# Patient Record
Sex: Female | Born: 1950
Health system: Southern US, Community
[De-identification: ages and names within clinical notes are randomized; demographics above are authoritative.]

## PROBLEM LIST (undated history)

## (undated) DIAGNOSIS — E785 Hyperlipidemia, unspecified: Secondary | ICD-10-CM

## (undated) DIAGNOSIS — R634 Abnormal weight loss: Secondary | ICD-10-CM

## (undated) DIAGNOSIS — D696 Thrombocytopenia, unspecified: Secondary | ICD-10-CM

## (undated) DIAGNOSIS — H269 Unspecified cataract: Secondary | ICD-10-CM

## (undated) DIAGNOSIS — M79606 Pain in leg, unspecified: Secondary | ICD-10-CM

## (undated) DIAGNOSIS — T7840XA Allergy, unspecified, initial encounter: Secondary | ICD-10-CM

## (undated) DIAGNOSIS — I10 Essential (primary) hypertension: Secondary | ICD-10-CM

## (undated) DIAGNOSIS — N289 Disorder of kidney and ureter, unspecified: Secondary | ICD-10-CM

## (undated) DIAGNOSIS — F17200 Nicotine dependence, unspecified, uncomplicated: Secondary | ICD-10-CM

## (undated) DIAGNOSIS — F209 Schizophrenia, unspecified: Secondary | ICD-10-CM

## (undated) DIAGNOSIS — G894 Chronic pain syndrome: Secondary | ICD-10-CM

## (undated) DIAGNOSIS — N189 Chronic kidney disease, unspecified: Secondary | ICD-10-CM

## (undated) DIAGNOSIS — M810 Age-related osteoporosis without current pathological fracture: Secondary | ICD-10-CM

## (undated) HISTORY — DX: Thrombocytopenia, unspecified: D69.6

## (undated) HISTORY — DX: Allergy, unspecified, initial encounter: T78.40XA

## (undated) HISTORY — DX: Disorder of kidney and ureter, unspecified: N28.9

## (undated) HISTORY — DX: Chronic kidney disease, unspecified: N18.9

## (undated) HISTORY — DX: Hyperlipidemia, unspecified: E78.5

## (undated) HISTORY — DX: Unspecified cataract: H26.9

## (undated) HISTORY — DX: Pain in leg, unspecified: M79.606

## (undated) HISTORY — DX: Schizophrenia, unspecified: F20.9

## (undated) HISTORY — PX: CATARACT EXTRACTION: SUR2

## (undated) HISTORY — DX: Abnormal weight loss: R63.4

## (undated) HISTORY — DX: Essential (primary) hypertension: I10

## (undated) HISTORY — DX: Chronic pain syndrome: G89.4

## (undated) HISTORY — DX: Nicotine dependence, unspecified, uncomplicated: F17.200

## (undated) HISTORY — PX: OTHER SURGICAL HISTORY: SHX169

## (undated) HISTORY — DX: Age-related osteoporosis without current pathological fracture: M81.0

---

## 2011-04-09 ENCOUNTER — Ambulatory Visit: Payer: Self-pay | Admitting: Oncology

## 2011-04-11 ENCOUNTER — Ambulatory Visit: Payer: Self-pay | Admitting: Oncology

## 2011-05-11 ENCOUNTER — Ambulatory Visit: Payer: Self-pay | Admitting: Oncology

## 2011-07-17 ENCOUNTER — Ambulatory Visit: Payer: Self-pay | Admitting: Oncology

## 2011-08-11 ENCOUNTER — Ambulatory Visit: Payer: Self-pay | Admitting: Oncology

## 2011-09-11 ENCOUNTER — Ambulatory Visit: Payer: Self-pay | Admitting: Oncology

## 2011-10-11 ENCOUNTER — Ambulatory Visit: Payer: Self-pay | Admitting: Oncology

## 2011-12-31 DIAGNOSIS — E7800 Pure hypercholesterolemia, unspecified: Secondary | ICD-10-CM | POA: Diagnosis not present

## 2011-12-31 DIAGNOSIS — I10 Essential (primary) hypertension: Secondary | ICD-10-CM | POA: Diagnosis not present

## 2011-12-31 DIAGNOSIS — Z01419 Encounter for gynecological examination (general) (routine) without abnormal findings: Secondary | ICD-10-CM | POA: Diagnosis not present

## 2011-12-31 DIAGNOSIS — G8929 Other chronic pain: Secondary | ICD-10-CM | POA: Diagnosis not present

## 2012-01-15 DIAGNOSIS — I10 Essential (primary) hypertension: Secondary | ICD-10-CM | POA: Diagnosis not present

## 2012-01-15 DIAGNOSIS — M5430 Sciatica, unspecified side: Secondary | ICD-10-CM | POA: Diagnosis not present

## 2012-01-15 DIAGNOSIS — M549 Dorsalgia, unspecified: Secondary | ICD-10-CM | POA: Diagnosis not present

## 2012-01-15 DIAGNOSIS — I739 Peripheral vascular disease, unspecified: Secondary | ICD-10-CM | POA: Diagnosis not present

## 2012-01-23 DIAGNOSIS — Z1231 Encounter for screening mammogram for malignant neoplasm of breast: Secondary | ICD-10-CM | POA: Diagnosis not present

## 2012-02-10 DIAGNOSIS — I10 Essential (primary) hypertension: Secondary | ICD-10-CM | POA: Diagnosis not present

## 2012-02-10 DIAGNOSIS — E872 Acidosis: Secondary | ICD-10-CM | POA: Diagnosis not present

## 2012-02-10 DIAGNOSIS — D472 Monoclonal gammopathy: Secondary | ICD-10-CM | POA: Diagnosis not present

## 2012-03-01 ENCOUNTER — Ambulatory Visit: Payer: Self-pay | Admitting: Oncology

## 2012-03-01 DIAGNOSIS — F209 Schizophrenia, unspecified: Secondary | ICD-10-CM | POA: Diagnosis not present

## 2012-03-01 DIAGNOSIS — Z79899 Other long term (current) drug therapy: Secondary | ICD-10-CM | POA: Diagnosis not present

## 2012-03-01 DIAGNOSIS — D693 Immune thrombocytopenic purpura: Secondary | ICD-10-CM | POA: Diagnosis not present

## 2012-03-01 DIAGNOSIS — F17200 Nicotine dependence, unspecified, uncomplicated: Secondary | ICD-10-CM | POA: Diagnosis not present

## 2012-03-01 DIAGNOSIS — M810 Age-related osteoporosis without current pathological fracture: Secondary | ICD-10-CM | POA: Diagnosis not present

## 2012-03-01 DIAGNOSIS — I129 Hypertensive chronic kidney disease with stage 1 through stage 4 chronic kidney disease, or unspecified chronic kidney disease: Secondary | ICD-10-CM | POA: Diagnosis not present

## 2012-03-01 DIAGNOSIS — N189 Chronic kidney disease, unspecified: Secondary | ICD-10-CM | POA: Diagnosis not present

## 2012-03-01 DIAGNOSIS — E785 Hyperlipidemia, unspecified: Secondary | ICD-10-CM | POA: Diagnosis not present

## 2012-03-01 DIAGNOSIS — D472 Monoclonal gammopathy: Secondary | ICD-10-CM | POA: Diagnosis not present

## 2012-03-01 LAB — CBC CANCER CENTER
Basophil #: 0.1 x10 3/mm (ref 0.0–0.1)
Basophil %: 1.2 %
Eosinophil %: 2.2 %
Lymphocyte #: 2.2 x10 3/mm (ref 1.0–3.6)
Lymphocyte %: 34 %
MCV: 92 fL (ref 80–100)
Monocyte #: 0.6 x10 3/mm (ref 0.2–0.9)
Neutrophil %: 54 %
Platelet: 38 x10 3/mm — ABNORMAL LOW (ref 150–440)
RBC: 4.68 10*6/uL (ref 3.80–5.20)
RDW: 14.2 % (ref 11.5–14.5)

## 2012-03-08 DIAGNOSIS — I129 Hypertensive chronic kidney disease with stage 1 through stage 4 chronic kidney disease, or unspecified chronic kidney disease: Secondary | ICD-10-CM | POA: Diagnosis not present

## 2012-03-08 DIAGNOSIS — E785 Hyperlipidemia, unspecified: Secondary | ICD-10-CM | POA: Diagnosis not present

## 2012-03-08 DIAGNOSIS — D693 Immune thrombocytopenic purpura: Secondary | ICD-10-CM | POA: Diagnosis not present

## 2012-03-08 DIAGNOSIS — D472 Monoclonal gammopathy: Secondary | ICD-10-CM | POA: Diagnosis not present

## 2012-03-08 DIAGNOSIS — N189 Chronic kidney disease, unspecified: Secondary | ICD-10-CM | POA: Diagnosis not present

## 2012-03-08 DIAGNOSIS — M810 Age-related osteoporosis without current pathological fracture: Secondary | ICD-10-CM | POA: Diagnosis not present

## 2012-03-08 LAB — CBC CANCER CENTER
Eosinophil #: 0 x10 3/mm (ref 0.0–0.7)
Eosinophil %: 0.3 %
HCT: 43.6 % (ref 35.0–47.0)
HGB: 14.1 g/dL (ref 12.0–16.0)
Lymphocyte %: 22.6 %
MCH: 29.8 pg (ref 26.0–34.0)
MCHC: 32.4 g/dL (ref 32.0–36.0)
MCV: 92 fL (ref 80–100)
Monocyte #: 1.4 x10 3/mm — ABNORMAL HIGH (ref 0.2–0.9)

## 2012-03-10 ENCOUNTER — Ambulatory Visit: Payer: Self-pay | Admitting: Oncology

## 2012-04-02 DIAGNOSIS — J309 Allergic rhinitis, unspecified: Secondary | ICD-10-CM | POA: Diagnosis not present

## 2012-04-06 DIAGNOSIS — F209 Schizophrenia, unspecified: Secondary | ICD-10-CM | POA: Diagnosis not present

## 2012-06-07 ENCOUNTER — Ambulatory Visit: Payer: Self-pay | Admitting: Oncology

## 2012-06-07 DIAGNOSIS — I129 Hypertensive chronic kidney disease with stage 1 through stage 4 chronic kidney disease, or unspecified chronic kidney disease: Secondary | ICD-10-CM | POA: Diagnosis not present

## 2012-06-07 DIAGNOSIS — M810 Age-related osteoporosis without current pathological fracture: Secondary | ICD-10-CM | POA: Diagnosis not present

## 2012-06-07 DIAGNOSIS — F17200 Nicotine dependence, unspecified, uncomplicated: Secondary | ICD-10-CM | POA: Diagnosis not present

## 2012-06-07 DIAGNOSIS — N189 Chronic kidney disease, unspecified: Secondary | ICD-10-CM | POA: Diagnosis not present

## 2012-06-07 DIAGNOSIS — F209 Schizophrenia, unspecified: Secondary | ICD-10-CM | POA: Diagnosis not present

## 2012-06-07 DIAGNOSIS — D693 Immune thrombocytopenic purpura: Secondary | ICD-10-CM | POA: Diagnosis not present

## 2012-06-07 DIAGNOSIS — Z79899 Other long term (current) drug therapy: Secondary | ICD-10-CM | POA: Diagnosis not present

## 2012-06-07 DIAGNOSIS — E785 Hyperlipidemia, unspecified: Secondary | ICD-10-CM | POA: Diagnosis not present

## 2012-06-07 DIAGNOSIS — D472 Monoclonal gammopathy: Secondary | ICD-10-CM | POA: Diagnosis not present

## 2012-06-07 DIAGNOSIS — M1990 Unspecified osteoarthritis, unspecified site: Secondary | ICD-10-CM | POA: Diagnosis not present

## 2012-06-07 LAB — CBC CANCER CENTER
Eosinophil #: 0.1 x10 3/mm (ref 0.0–0.7)
Eosinophil %: 2.6 %
MCH: 31.3 pg (ref 26.0–34.0)
MCHC: 34.3 g/dL (ref 32.0–36.0)
MCV: 91 fL (ref 80–100)
Monocyte #: 0.4 x10 3/mm (ref 0.2–0.9)
Neutrophil %: 56.6 %
Platelet: 23 x10 3/mm — CL (ref 150–440)
RBC: 4.57 10*6/uL (ref 3.80–5.20)
RDW: 13.2 % (ref 11.5–14.5)

## 2012-06-08 DIAGNOSIS — I10 Essential (primary) hypertension: Secondary | ICD-10-CM | POA: Diagnosis not present

## 2012-06-08 DIAGNOSIS — D472 Monoclonal gammopathy: Secondary | ICD-10-CM | POA: Diagnosis not present

## 2012-06-08 DIAGNOSIS — E872 Acidosis: Secondary | ICD-10-CM | POA: Diagnosis not present

## 2012-06-10 ENCOUNTER — Ambulatory Visit: Payer: Self-pay | Admitting: Oncology

## 2012-06-10 DIAGNOSIS — D693 Immune thrombocytopenic purpura: Secondary | ICD-10-CM | POA: Diagnosis not present

## 2012-06-10 DIAGNOSIS — D472 Monoclonal gammopathy: Secondary | ICD-10-CM | POA: Diagnosis not present

## 2012-06-10 DIAGNOSIS — Z5111 Encounter for antineoplastic chemotherapy: Secondary | ICD-10-CM | POA: Diagnosis not present

## 2012-06-10 DIAGNOSIS — I129 Hypertensive chronic kidney disease with stage 1 through stage 4 chronic kidney disease, or unspecified chronic kidney disease: Secondary | ICD-10-CM | POA: Diagnosis not present

## 2012-06-10 DIAGNOSIS — F17200 Nicotine dependence, unspecified, uncomplicated: Secondary | ICD-10-CM | POA: Diagnosis not present

## 2012-06-10 DIAGNOSIS — M810 Age-related osteoporosis without current pathological fracture: Secondary | ICD-10-CM | POA: Diagnosis not present

## 2012-06-10 DIAGNOSIS — Z79899 Other long term (current) drug therapy: Secondary | ICD-10-CM | POA: Diagnosis not present

## 2012-06-10 DIAGNOSIS — E785 Hyperlipidemia, unspecified: Secondary | ICD-10-CM | POA: Diagnosis not present

## 2012-06-10 DIAGNOSIS — N189 Chronic kidney disease, unspecified: Secondary | ICD-10-CM | POA: Diagnosis not present

## 2012-06-10 DIAGNOSIS — F259 Schizoaffective disorder, unspecified: Secondary | ICD-10-CM | POA: Diagnosis not present

## 2012-06-10 DIAGNOSIS — M1990 Unspecified osteoarthritis, unspecified site: Secondary | ICD-10-CM | POA: Diagnosis not present

## 2012-07-06 DIAGNOSIS — D693 Immune thrombocytopenic purpura: Secondary | ICD-10-CM | POA: Diagnosis not present

## 2012-07-06 DIAGNOSIS — D472 Monoclonal gammopathy: Secondary | ICD-10-CM | POA: Diagnosis not present

## 2012-07-06 LAB — CBC CANCER CENTER
Basophil #: 0 x10 3/mm (ref 0.0–0.1)
Eosinophil #: 0.2 x10 3/mm (ref 0.0–0.7)
Eosinophil %: 3.8 %
HCT: 40.4 % (ref 35.0–47.0)
HGB: 13.3 g/dL (ref 12.0–16.0)
Lymphocyte #: 1.7 x10 3/mm (ref 1.0–3.6)
Lymphocyte %: 30.7 %
MCV: 92 fL (ref 80–100)
Monocyte %: 9.3 %
Neutrophil #: 3 x10 3/mm (ref 1.4–6.5)
Neutrophil %: 55.3 %
Platelet: 13 x10 3/mm — CL (ref 150–440)
RBC: 4.4 10*6/uL (ref 3.80–5.20)
RDW: 13.4 % (ref 11.5–14.5)
WBC: 5.4 x10 3/mm (ref 3.6–11.0)

## 2012-07-09 DIAGNOSIS — I10 Essential (primary) hypertension: Secondary | ICD-10-CM | POA: Diagnosis not present

## 2012-07-09 DIAGNOSIS — E785 Hyperlipidemia, unspecified: Secondary | ICD-10-CM | POA: Diagnosis not present

## 2012-07-11 ENCOUNTER — Ambulatory Visit: Payer: Self-pay | Admitting: Oncology

## 2012-07-11 DIAGNOSIS — D693 Immune thrombocytopenic purpura: Secondary | ICD-10-CM | POA: Diagnosis not present

## 2012-07-11 DIAGNOSIS — F209 Schizophrenia, unspecified: Secondary | ICD-10-CM | POA: Diagnosis not present

## 2012-07-11 DIAGNOSIS — M810 Age-related osteoporosis without current pathological fracture: Secondary | ICD-10-CM | POA: Diagnosis not present

## 2012-07-11 DIAGNOSIS — N189 Chronic kidney disease, unspecified: Secondary | ICD-10-CM | POA: Diagnosis not present

## 2012-07-11 DIAGNOSIS — Z79899 Other long term (current) drug therapy: Secondary | ICD-10-CM | POA: Diagnosis not present

## 2012-07-11 DIAGNOSIS — Z5111 Encounter for antineoplastic chemotherapy: Secondary | ICD-10-CM | POA: Diagnosis not present

## 2012-07-11 DIAGNOSIS — E785 Hyperlipidemia, unspecified: Secondary | ICD-10-CM | POA: Diagnosis not present

## 2012-07-11 DIAGNOSIS — I129 Hypertensive chronic kidney disease with stage 1 through stage 4 chronic kidney disease, or unspecified chronic kidney disease: Secondary | ICD-10-CM | POA: Diagnosis not present

## 2012-07-11 DIAGNOSIS — D472 Monoclonal gammopathy: Secondary | ICD-10-CM | POA: Diagnosis not present

## 2012-07-13 DIAGNOSIS — D472 Monoclonal gammopathy: Secondary | ICD-10-CM | POA: Diagnosis not present

## 2012-07-13 DIAGNOSIS — D693 Immune thrombocytopenic purpura: Secondary | ICD-10-CM | POA: Diagnosis not present

## 2012-07-13 LAB — CBC CANCER CENTER
Basophil #: 0 x10 3/mm (ref 0.0–0.1)
Basophil %: 0.6 %
Eosinophil #: 0.2 x10 3/mm (ref 0.0–0.7)
Eosinophil %: 3.8 %
HCT: 42.7 % (ref 35.0–47.0)
Lymphocyte #: 1.5 x10 3/mm (ref 1.0–3.6)
MCH: 30.4 pg (ref 26.0–34.0)
MCV: 92 fL (ref 80–100)
Neutrophil #: 3.1 x10 3/mm (ref 1.4–6.5)
Platelet: 65 x10 3/mm — ABNORMAL LOW (ref 150–440)
RBC: 4.64 10*6/uL (ref 3.80–5.20)
WBC: 5.3 x10 3/mm (ref 3.6–11.0)

## 2012-07-20 DIAGNOSIS — D472 Monoclonal gammopathy: Secondary | ICD-10-CM | POA: Diagnosis not present

## 2012-07-20 DIAGNOSIS — D693 Immune thrombocytopenic purpura: Secondary | ICD-10-CM | POA: Diagnosis not present

## 2012-07-20 LAB — CBC CANCER CENTER
Basophil #: 0 x10 3/mm (ref 0.0–0.1)
Basophil %: 0.7 %
Eosinophil #: 0.3 x10 3/mm (ref 0.0–0.7)
Eosinophil %: 4.3 %
HGB: 13.8 g/dL (ref 12.0–16.0)
Lymphocyte #: 1.6 x10 3/mm (ref 1.0–3.6)
Lymphocyte %: 24.7 %
MCHC: 33.1 g/dL (ref 32.0–36.0)
MCV: 91 fL (ref 80–100)
Monocyte #: 0.5 x10 3/mm (ref 0.2–0.9)
Monocyte %: 7.7 %
Neutrophil #: 4.1 x10 3/mm (ref 1.4–6.5)
Neutrophil %: 62.6 %
Platelet: 33 x10 3/mm — ABNORMAL LOW (ref 150–440)
RBC: 4.55 10*6/uL (ref 3.80–5.20)

## 2012-07-27 DIAGNOSIS — D693 Immune thrombocytopenic purpura: Secondary | ICD-10-CM | POA: Diagnosis not present

## 2012-07-27 DIAGNOSIS — D472 Monoclonal gammopathy: Secondary | ICD-10-CM | POA: Diagnosis not present

## 2012-07-27 LAB — CBC CANCER CENTER
Basophil %: 0.9 %
Eosinophil #: 0.2 x10 3/mm (ref 0.0–0.7)
HCT: 42.8 % (ref 35.0–47.0)
Lymphocyte %: 20.8 %
MCH: 29.9 pg (ref 26.0–34.0)
MCV: 91 fL (ref 80–100)
Monocyte #: 0.6 x10 3/mm (ref 0.2–0.9)
Monocyte %: 8.5 %
Neutrophil #: 4.8 x10 3/mm (ref 1.4–6.5)
Neutrophil %: 66.6 %
Platelet: 51 x10 3/mm — ABNORMAL LOW (ref 150–440)
RBC: 4.73 10*6/uL (ref 3.80–5.20)

## 2012-08-10 ENCOUNTER — Ambulatory Visit: Payer: Self-pay | Admitting: Oncology

## 2012-08-10 DIAGNOSIS — N189 Chronic kidney disease, unspecified: Secondary | ICD-10-CM | POA: Diagnosis not present

## 2012-08-10 DIAGNOSIS — F209 Schizophrenia, unspecified: Secondary | ICD-10-CM | POA: Diagnosis not present

## 2012-08-10 DIAGNOSIS — D693 Immune thrombocytopenic purpura: Secondary | ICD-10-CM | POA: Diagnosis not present

## 2012-08-10 DIAGNOSIS — I129 Hypertensive chronic kidney disease with stage 1 through stage 4 chronic kidney disease, or unspecified chronic kidney disease: Secondary | ICD-10-CM | POA: Diagnosis not present

## 2012-08-10 DIAGNOSIS — E785 Hyperlipidemia, unspecified: Secondary | ICD-10-CM | POA: Diagnosis not present

## 2012-08-10 DIAGNOSIS — M810 Age-related osteoporosis without current pathological fracture: Secondary | ICD-10-CM | POA: Diagnosis not present

## 2012-08-10 DIAGNOSIS — D472 Monoclonal gammopathy: Secondary | ICD-10-CM | POA: Diagnosis not present

## 2012-08-10 DIAGNOSIS — M1990 Unspecified osteoarthritis, unspecified site: Secondary | ICD-10-CM | POA: Diagnosis not present

## 2012-08-10 DIAGNOSIS — Z79899 Other long term (current) drug therapy: Secondary | ICD-10-CM | POA: Diagnosis not present

## 2012-08-24 DIAGNOSIS — D693 Immune thrombocytopenic purpura: Secondary | ICD-10-CM | POA: Diagnosis not present

## 2012-08-24 DIAGNOSIS — D472 Monoclonal gammopathy: Secondary | ICD-10-CM | POA: Diagnosis not present

## 2012-08-24 LAB — CBC CANCER CENTER
Basophil #: 0.1 x10 3/mm (ref 0.0–0.1)
Eosinophil #: 0.2 x10 3/mm (ref 0.0–0.7)
HCT: 42 % (ref 35.0–47.0)
HGB: 13.7 g/dL (ref 12.0–16.0)
Lymphocyte %: 25 %
MCHC: 32.6 g/dL (ref 32.0–36.0)
Monocyte #: 0.7 x10 3/mm (ref 0.2–0.9)
Monocyte %: 9.1 %
Neutrophil #: 4.7 x10 3/mm (ref 1.4–6.5)
Neutrophil %: 62.6 %
RDW: 14.2 % (ref 11.5–14.5)
WBC: 7.5 x10 3/mm (ref 3.6–11.0)

## 2012-09-10 ENCOUNTER — Ambulatory Visit: Payer: Self-pay | Admitting: Oncology

## 2012-09-10 DIAGNOSIS — D693 Immune thrombocytopenic purpura: Secondary | ICD-10-CM | POA: Diagnosis not present

## 2012-09-10 DIAGNOSIS — D472 Monoclonal gammopathy: Secondary | ICD-10-CM | POA: Diagnosis not present

## 2012-09-24 LAB — CBC CANCER CENTER
Basophil #: 0.1 x10 3/mm (ref 0.0–0.1)
HCT: 41 % (ref 35.0–47.0)
Lymphocyte #: 2 x10 3/mm (ref 1.0–3.6)
Lymphocyte %: 29.8 %
MCV: 93 fL (ref 80–100)
Monocyte #: 0.5 x10 3/mm (ref 0.2–0.9)
Monocyte %: 8 %
Neutrophil #: 3.8 x10 3/mm (ref 1.4–6.5)
Neutrophil %: 57.4 %
Platelet: 41 x10 3/mm — ABNORMAL LOW (ref 150–440)
RBC: 4.42 10*6/uL (ref 3.80–5.20)
RDW: 14.8 % — ABNORMAL HIGH (ref 11.5–14.5)
WBC: 6.7 x10 3/mm (ref 3.6–11.0)

## 2012-10-04 DIAGNOSIS — E785 Hyperlipidemia, unspecified: Secondary | ICD-10-CM | POA: Diagnosis not present

## 2012-10-04 DIAGNOSIS — I10 Essential (primary) hypertension: Secondary | ICD-10-CM | POA: Diagnosis not present

## 2012-10-04 DIAGNOSIS — Z23 Encounter for immunization: Secondary | ICD-10-CM | POA: Diagnosis not present

## 2012-10-10 ENCOUNTER — Ambulatory Visit: Payer: Self-pay | Admitting: Oncology

## 2012-10-10 DIAGNOSIS — E785 Hyperlipidemia, unspecified: Secondary | ICD-10-CM | POA: Diagnosis not present

## 2012-10-10 DIAGNOSIS — N189 Chronic kidney disease, unspecified: Secondary | ICD-10-CM | POA: Diagnosis not present

## 2012-10-10 DIAGNOSIS — M1990 Unspecified osteoarthritis, unspecified site: Secondary | ICD-10-CM | POA: Diagnosis not present

## 2012-10-10 DIAGNOSIS — Z79899 Other long term (current) drug therapy: Secondary | ICD-10-CM | POA: Diagnosis not present

## 2012-10-10 DIAGNOSIS — I129 Hypertensive chronic kidney disease with stage 1 through stage 4 chronic kidney disease, or unspecified chronic kidney disease: Secondary | ICD-10-CM | POA: Diagnosis not present

## 2012-10-10 DIAGNOSIS — D472 Monoclonal gammopathy: Secondary | ICD-10-CM | POA: Diagnosis not present

## 2012-10-10 DIAGNOSIS — M810 Age-related osteoporosis without current pathological fracture: Secondary | ICD-10-CM | POA: Diagnosis not present

## 2012-10-10 DIAGNOSIS — F209 Schizophrenia, unspecified: Secondary | ICD-10-CM | POA: Diagnosis not present

## 2012-10-10 DIAGNOSIS — D693 Immune thrombocytopenic purpura: Secondary | ICD-10-CM | POA: Diagnosis not present

## 2012-10-26 LAB — CBC CANCER CENTER
Basophil #: 0 x10 3/mm (ref 0.0–0.1)
Basophil %: 0.8 %
Eosinophil #: 0.1 x10 3/mm (ref 0.0–0.7)
HCT: 40.3 % (ref 35.0–47.0)
HGB: 14.1 g/dL (ref 12.0–16.0)
Lymphocyte #: 2.1 x10 3/mm (ref 1.0–3.6)
Lymphocyte %: 42.3 %
MCH: 31.1 pg (ref 26.0–34.0)
MCHC: 35 g/dL (ref 32.0–36.0)
Monocyte #: 0.7 x10 3/mm (ref 0.2–0.9)
Monocyte %: 14.3 %
Neutrophil #: 2 x10 3/mm (ref 1.4–6.5)
RBC: 4.54 10*6/uL (ref 3.80–5.20)
RDW: 14.3 % (ref 11.5–14.5)
WBC: 5 x10 3/mm (ref 3.6–11.0)

## 2012-11-05 DIAGNOSIS — D693 Immune thrombocytopenic purpura: Secondary | ICD-10-CM | POA: Diagnosis not present

## 2012-11-05 DIAGNOSIS — D472 Monoclonal gammopathy: Secondary | ICD-10-CM | POA: Diagnosis not present

## 2012-11-05 LAB — CBC CANCER CENTER
Basophil #: 0 x10 3/mm (ref 0.0–0.1)
Basophil %: 0.2 %
Eosinophil #: 0.1 x10 3/mm (ref 0.0–0.7)
Eosinophil %: 1.1 %
HGB: 13.9 g/dL (ref 12.0–16.0)
MCH: 30 pg (ref 26.0–34.0)
MCV: 91 fL (ref 80–100)
Monocyte %: 22.1 %
Neutrophil %: 27 %
Platelet: 286 x10 3/mm (ref 150–440)
RBC: 4.63 10*6/uL (ref 3.80–5.20)
RDW: 14.4 % (ref 11.5–14.5)
WBC: 5.7 x10 3/mm (ref 3.6–11.0)

## 2012-11-10 ENCOUNTER — Ambulatory Visit: Payer: Self-pay | Admitting: Oncology

## 2012-11-10 DIAGNOSIS — D693 Immune thrombocytopenic purpura: Secondary | ICD-10-CM | POA: Diagnosis not present

## 2012-11-10 DIAGNOSIS — D472 Monoclonal gammopathy: Secondary | ICD-10-CM | POA: Diagnosis not present

## 2012-12-06 LAB — CBC CANCER CENTER
Basophil #: 0.1 x10 3/mm (ref 0.0–0.1)
Basophil %: 0.8 %
Eosinophil #: 0.1 x10 3/mm (ref 0.0–0.7)
Eosinophil %: 1.7 %
HCT: 41.5 % (ref 35.0–47.0)
HGB: 13.9 g/dL (ref 12.0–16.0)
Lymphocyte %: 24.8 %
MCH: 30.6 pg (ref 26.0–34.0)
MCHC: 33.6 g/dL (ref 32.0–36.0)
Monocyte #: 0.6 x10 3/mm (ref 0.2–0.9)
Monocyte %: 8.3 %
Neutrophil #: 5 x10 3/mm (ref 1.4–6.5)
RBC: 4.56 10*6/uL (ref 3.80–5.20)
RDW: 14.3 % (ref 11.5–14.5)

## 2012-12-11 ENCOUNTER — Ambulatory Visit: Payer: Self-pay | Admitting: Oncology

## 2012-12-11 DIAGNOSIS — D472 Monoclonal gammopathy: Secondary | ICD-10-CM | POA: Diagnosis not present

## 2012-12-11 DIAGNOSIS — D693 Immune thrombocytopenic purpura: Secondary | ICD-10-CM | POA: Diagnosis not present

## 2013-01-06 LAB — CBC CANCER CENTER
Basophil #: 0.1 x10 3/mm (ref 0.0–0.1)
Eosinophil %: 1.7 %
HCT: 39.8 % (ref 35.0–47.0)
Lymphocyte #: 1.5 x10 3/mm (ref 1.0–3.6)
MCH: 31.1 pg (ref 26.0–34.0)
MCHC: 34.1 g/dL (ref 32.0–36.0)
MCV: 91 fL (ref 80–100)
Monocyte #: 0.5 x10 3/mm (ref 0.2–0.9)
Monocyte %: 7.8 %
Neutrophil %: 65.1 %
Platelet: 127 x10 3/mm — ABNORMAL LOW (ref 150–440)
RBC: 4.35 10*6/uL (ref 3.80–5.20)
RDW: 14.7 % — ABNORMAL HIGH (ref 11.5–14.5)
WBC: 6.1 x10 3/mm (ref 3.6–11.0)

## 2013-01-08 ENCOUNTER — Ambulatory Visit: Payer: Self-pay | Admitting: Oncology

## 2013-01-08 DIAGNOSIS — M1990 Unspecified osteoarthritis, unspecified site: Secondary | ICD-10-CM | POA: Diagnosis not present

## 2013-01-08 DIAGNOSIS — F209 Schizophrenia, unspecified: Secondary | ICD-10-CM | POA: Diagnosis not present

## 2013-01-08 DIAGNOSIS — N189 Chronic kidney disease, unspecified: Secondary | ICD-10-CM | POA: Diagnosis not present

## 2013-01-08 DIAGNOSIS — Z79899 Other long term (current) drug therapy: Secondary | ICD-10-CM | POA: Diagnosis not present

## 2013-01-08 DIAGNOSIS — I129 Hypertensive chronic kidney disease with stage 1 through stage 4 chronic kidney disease, or unspecified chronic kidney disease: Secondary | ICD-10-CM | POA: Diagnosis not present

## 2013-01-08 DIAGNOSIS — M810 Age-related osteoporosis without current pathological fracture: Secondary | ICD-10-CM | POA: Diagnosis not present

## 2013-01-08 DIAGNOSIS — D693 Immune thrombocytopenic purpura: Secondary | ICD-10-CM | POA: Diagnosis not present

## 2013-01-08 DIAGNOSIS — E785 Hyperlipidemia, unspecified: Secondary | ICD-10-CM | POA: Diagnosis not present

## 2013-01-08 DIAGNOSIS — D472 Monoclonal gammopathy: Secondary | ICD-10-CM | POA: Diagnosis not present

## 2013-01-13 DIAGNOSIS — H2510 Age-related nuclear cataract, unspecified eye: Secondary | ICD-10-CM | POA: Diagnosis not present

## 2013-01-19 DIAGNOSIS — Z0000 Encounter for general adult medical examination without abnormal findings: Secondary | ICD-10-CM | POA: Diagnosis not present

## 2013-01-19 DIAGNOSIS — G8929 Other chronic pain: Secondary | ICD-10-CM | POA: Diagnosis not present

## 2013-01-19 DIAGNOSIS — R5381 Other malaise: Secondary | ICD-10-CM | POA: Diagnosis not present

## 2013-01-19 DIAGNOSIS — E7800 Pure hypercholesterolemia, unspecified: Secondary | ICD-10-CM | POA: Diagnosis not present

## 2013-01-28 DIAGNOSIS — R319 Hematuria, unspecified: Secondary | ICD-10-CM | POA: Diagnosis not present

## 2013-02-03 DIAGNOSIS — D693 Immune thrombocytopenic purpura: Secondary | ICD-10-CM | POA: Diagnosis not present

## 2013-02-03 DIAGNOSIS — D472 Monoclonal gammopathy: Secondary | ICD-10-CM | POA: Diagnosis not present

## 2013-02-03 LAB — CBC CANCER CENTER
Basophil %: 0.7 %
Eosinophil #: 0.1 x10 3/mm (ref 0.0–0.7)
Eosinophil %: 1.2 %
HGB: 14.7 g/dL (ref 12.0–16.0)
Lymphocyte %: 22.6 %
MCH: 30.6 pg (ref 26.0–34.0)
MCHC: 33.6 g/dL (ref 32.0–36.0)
MCV: 91 fL (ref 80–100)
Neutrophil #: 5.2 x10 3/mm (ref 1.4–6.5)
Neutrophil %: 67.5 %
Platelet: 174 x10 3/mm (ref 150–440)
RDW: 14.1 % (ref 11.5–14.5)

## 2013-02-04 DIAGNOSIS — H524 Presbyopia: Secondary | ICD-10-CM | POA: Diagnosis not present

## 2013-02-04 DIAGNOSIS — H2510 Age-related nuclear cataract, unspecified eye: Secondary | ICD-10-CM | POA: Diagnosis not present

## 2013-02-04 DIAGNOSIS — H5200 Hypermetropia, unspecified eye: Secondary | ICD-10-CM | POA: Diagnosis not present

## 2013-02-04 DIAGNOSIS — H18419 Arcus senilis, unspecified eye: Secondary | ICD-10-CM | POA: Diagnosis not present

## 2013-02-08 ENCOUNTER — Ambulatory Visit: Payer: Self-pay | Admitting: Oncology

## 2013-02-24 DIAGNOSIS — Z1231 Encounter for screening mammogram for malignant neoplasm of breast: Secondary | ICD-10-CM | POA: Diagnosis not present

## 2013-03-08 DIAGNOSIS — R3129 Other microscopic hematuria: Secondary | ICD-10-CM | POA: Diagnosis not present

## 2013-03-10 ENCOUNTER — Ambulatory Visit: Payer: Self-pay | Admitting: Oncology

## 2013-03-10 DIAGNOSIS — D693 Immune thrombocytopenic purpura: Secondary | ICD-10-CM | POA: Diagnosis not present

## 2013-03-17 ENCOUNTER — Ambulatory Visit: Payer: Self-pay | Admitting: Oncology

## 2013-03-17 LAB — CBC CANCER CENTER
Basophil #: 0.1 x10 3/mm (ref 0.0–0.1)
Basophil %: 1.4 %
Eosinophil #: 0.2 x10 3/mm (ref 0.0–0.7)
Eosinophil %: 4.4 %
HCT: 38.8 % (ref 35.0–47.0)
Lymphocyte #: 1.4 x10 3/mm (ref 1.0–3.6)
Lymphocyte %: 26.8 %
MCHC: 34.1 g/dL (ref 32.0–36.0)
MCV: 92 fL (ref 80–100)
Monocyte #: 0.5 x10 3/mm (ref 0.2–0.9)
Monocyte %: 9.1 %
Neutrophil %: 58.3 %
Platelet: 125 x10 3/mm — ABNORMAL LOW (ref 150–440)
RBC: 4.19 10*6/uL (ref 3.80–5.20)
RDW: 13.8 % (ref 11.5–14.5)
WBC: 5.3 x10 3/mm (ref 3.6–11.0)

## 2013-03-22 ENCOUNTER — Ambulatory Visit: Payer: Self-pay

## 2013-03-22 DIAGNOSIS — R319 Hematuria, unspecified: Secondary | ICD-10-CM | POA: Diagnosis not present

## 2013-03-25 DIAGNOSIS — N289 Disorder of kidney and ureter, unspecified: Secondary | ICD-10-CM | POA: Diagnosis not present

## 2013-03-25 DIAGNOSIS — R3129 Other microscopic hematuria: Secondary | ICD-10-CM | POA: Diagnosis not present

## 2013-03-29 DIAGNOSIS — S6990XA Unspecified injury of unspecified wrist, hand and finger(s), initial encounter: Secondary | ICD-10-CM | POA: Diagnosis not present

## 2013-04-10 ENCOUNTER — Ambulatory Visit: Payer: Self-pay | Admitting: Oncology

## 2013-04-10 ENCOUNTER — Ambulatory Visit: Payer: Self-pay

## 2013-04-13 DIAGNOSIS — H25049 Posterior subcapsular polar age-related cataract, unspecified eye: Secondary | ICD-10-CM | POA: Diagnosis not present

## 2013-04-27 DIAGNOSIS — E785 Hyperlipidemia, unspecified: Secondary | ICD-10-CM | POA: Diagnosis not present

## 2013-04-27 DIAGNOSIS — I10 Essential (primary) hypertension: Secondary | ICD-10-CM | POA: Diagnosis not present

## 2013-05-10 ENCOUNTER — Ambulatory Visit: Payer: Self-pay | Admitting: Oncology

## 2013-05-10 DIAGNOSIS — E785 Hyperlipidemia, unspecified: Secondary | ICD-10-CM | POA: Diagnosis not present

## 2013-05-10 DIAGNOSIS — F209 Schizophrenia, unspecified: Secondary | ICD-10-CM | POA: Diagnosis not present

## 2013-05-10 DIAGNOSIS — D693 Immune thrombocytopenic purpura: Secondary | ICD-10-CM | POA: Diagnosis not present

## 2013-05-10 DIAGNOSIS — M810 Age-related osteoporosis without current pathological fracture: Secondary | ICD-10-CM | POA: Diagnosis not present

## 2013-05-10 DIAGNOSIS — I129 Hypertensive chronic kidney disease with stage 1 through stage 4 chronic kidney disease, or unspecified chronic kidney disease: Secondary | ICD-10-CM | POA: Diagnosis not present

## 2013-05-10 DIAGNOSIS — M1990 Unspecified osteoarthritis, unspecified site: Secondary | ICD-10-CM | POA: Diagnosis not present

## 2013-05-10 DIAGNOSIS — N189 Chronic kidney disease, unspecified: Secondary | ICD-10-CM | POA: Diagnosis not present

## 2013-05-10 DIAGNOSIS — Z79899 Other long term (current) drug therapy: Secondary | ICD-10-CM | POA: Diagnosis not present

## 2013-05-10 DIAGNOSIS — D472 Monoclonal gammopathy: Secondary | ICD-10-CM | POA: Diagnosis not present

## 2013-05-17 DIAGNOSIS — D472 Monoclonal gammopathy: Secondary | ICD-10-CM | POA: Diagnosis not present

## 2013-05-17 DIAGNOSIS — D693 Immune thrombocytopenic purpura: Secondary | ICD-10-CM | POA: Diagnosis not present

## 2013-05-17 LAB — CBC CANCER CENTER
Basophil #: 0.1 x10 3/mm (ref 0.0–0.1)
Basophil %: 0.9 %
Eosinophil #: 0.2 x10 3/mm (ref 0.0–0.7)
Eosinophil %: 4.1 %
HCT: 38 % (ref 35.0–47.0)
HGB: 13.1 g/dL (ref 12.0–16.0)
Lymphocyte #: 1.5 x10 3/mm (ref 1.0–3.6)
Lymphocyte %: 25.4 %
MCH: 31.2 pg (ref 26.0–34.0)
MCHC: 34.5 g/dL (ref 32.0–36.0)
MCV: 91 fL (ref 80–100)
Monocyte #: 0.5 x10 3/mm (ref 0.2–0.9)
Monocyte %: 7.7 %
Neutrophil #: 3.7 x10 3/mm (ref 1.4–6.5)
Neutrophil %: 61.9 %
Platelet: 167 x10 3/mm (ref 150–440)
RBC: 4.2 10*6/uL (ref 3.80–5.20)
RDW: 14.5 % (ref 11.5–14.5)
WBC: 6 x10 3/mm (ref 3.6–11.0)

## 2013-06-08 DIAGNOSIS — Z79899 Other long term (current) drug therapy: Secondary | ICD-10-CM | POA: Diagnosis not present

## 2013-06-08 DIAGNOSIS — N189 Chronic kidney disease, unspecified: Secondary | ICD-10-CM | POA: Diagnosis not present

## 2013-06-08 DIAGNOSIS — G8929 Other chronic pain: Secondary | ICD-10-CM | POA: Diagnosis not present

## 2013-06-08 DIAGNOSIS — E559 Vitamin D deficiency, unspecified: Secondary | ICD-10-CM | POA: Diagnosis not present

## 2013-06-10 ENCOUNTER — Ambulatory Visit: Payer: Self-pay | Admitting: Oncology

## 2013-06-16 ENCOUNTER — Ambulatory Visit: Payer: Self-pay | Admitting: Ophthalmology

## 2013-06-16 DIAGNOSIS — Z01812 Encounter for preprocedural laboratory examination: Secondary | ICD-10-CM | POA: Diagnosis not present

## 2013-06-16 DIAGNOSIS — Z01810 Encounter for preprocedural cardiovascular examination: Secondary | ICD-10-CM | POA: Diagnosis not present

## 2013-06-16 DIAGNOSIS — I119 Hypertensive heart disease without heart failure: Secondary | ICD-10-CM | POA: Diagnosis not present

## 2013-06-16 DIAGNOSIS — I10 Essential (primary) hypertension: Secondary | ICD-10-CM | POA: Diagnosis not present

## 2013-06-16 DIAGNOSIS — H2510 Age-related nuclear cataract, unspecified eye: Secondary | ICD-10-CM | POA: Diagnosis not present

## 2013-06-16 LAB — POTASSIUM: Potassium: 3.9 mmol/L (ref 3.5–5.1)

## 2013-06-23 ENCOUNTER — Ambulatory Visit: Payer: Self-pay | Admitting: Oncology

## 2013-06-23 DIAGNOSIS — D472 Monoclonal gammopathy: Secondary | ICD-10-CM | POA: Diagnosis not present

## 2013-06-23 DIAGNOSIS — D693 Immune thrombocytopenic purpura: Secondary | ICD-10-CM | POA: Diagnosis not present

## 2013-06-23 LAB — CBC CANCER CENTER
Basophil #: 0.1 x10 3/mm (ref 0.0–0.1)
Basophil %: 1.1 %
Eosinophil #: 0.2 x10 3/mm (ref 0.0–0.7)
Eosinophil %: 4.2 %
HGB: 13.9 g/dL (ref 12.0–16.0)
Lymphocyte %: 31.1 %
MCH: 31.5 pg (ref 26.0–34.0)
MCV: 92 fL (ref 80–100)
Neutrophil #: 2.7 x10 3/mm (ref 1.4–6.5)
Neutrophil %: 52.4 %

## 2013-06-28 ENCOUNTER — Ambulatory Visit: Payer: Self-pay | Admitting: Ophthalmology

## 2013-06-28 DIAGNOSIS — H269 Unspecified cataract: Secondary | ICD-10-CM | POA: Diagnosis not present

## 2013-06-28 DIAGNOSIS — H2510 Age-related nuclear cataract, unspecified eye: Secondary | ICD-10-CM | POA: Diagnosis not present

## 2013-06-28 DIAGNOSIS — Z79899 Other long term (current) drug therapy: Secondary | ICD-10-CM | POA: Diagnosis not present

## 2013-06-28 DIAGNOSIS — M129 Arthropathy, unspecified: Secondary | ICD-10-CM | POA: Diagnosis not present

## 2013-06-28 DIAGNOSIS — F17200 Nicotine dependence, unspecified, uncomplicated: Secondary | ICD-10-CM | POA: Diagnosis not present

## 2013-06-28 DIAGNOSIS — H409 Unspecified glaucoma: Secondary | ICD-10-CM | POA: Diagnosis not present

## 2013-06-28 DIAGNOSIS — E7800 Pure hypercholesterolemia, unspecified: Secondary | ICD-10-CM | POA: Diagnosis not present

## 2013-06-28 DIAGNOSIS — D691 Qualitative platelet defects: Secondary | ICD-10-CM | POA: Diagnosis not present

## 2013-06-28 DIAGNOSIS — I10 Essential (primary) hypertension: Secondary | ICD-10-CM | POA: Diagnosis not present

## 2013-07-11 ENCOUNTER — Ambulatory Visit: Payer: Self-pay | Admitting: Oncology

## 2013-07-12 DIAGNOSIS — K921 Melena: Secondary | ICD-10-CM | POA: Diagnosis not present

## 2013-07-26 DIAGNOSIS — Z1211 Encounter for screening for malignant neoplasm of colon: Secondary | ICD-10-CM | POA: Diagnosis not present

## 2013-07-26 DIAGNOSIS — K648 Other hemorrhoids: Secondary | ICD-10-CM | POA: Diagnosis not present

## 2013-07-26 DIAGNOSIS — K625 Hemorrhage of anus and rectum: Secondary | ICD-10-CM | POA: Diagnosis not present

## 2013-09-01 ENCOUNTER — Encounter: Payer: Self-pay | Admitting: Podiatrist

## 2013-09-01 ENCOUNTER — Ambulatory Visit (INDEPENDENT_AMBULATORY_CARE_PROVIDER_SITE_OTHER): Payer: Medicare Other | Admitting: Podiatrist

## 2013-09-01 VITALS — BP 105/66 | HR 86 | Resp 16 | Ht 66.0 in | Wt 146.0 lb

## 2013-09-01 DIAGNOSIS — B351 Tinea unguium: Secondary | ICD-10-CM | POA: Diagnosis not present

## 2013-09-01 DIAGNOSIS — M79609 Pain in unspecified limb: Secondary | ICD-10-CM | POA: Diagnosis not present

## 2013-09-01 NOTE — Progress Notes (Signed)
MRN: [id number] Name: Denise Everett  Sex: female Age: 62 y.o. DOB: [birth year]  Provider: Trudie Buckler P  Allergies: Review of patient's allergies indicates no known allergies.   Chief Complaint  Patient presents with  . toenails    need toenails trimmed     HPI: Patient is 62 y.o. female who presents today for debridement of painful toenails 1-5 bilateral.  Patient relates pain in shoes and with ambulation.  Relates she is no longer able to trim them on her own.  Past Medical History  Diagnosis Date  . Hypertension   . Chronic kidney disease   . Cataract        Medication List       This list is accurate as of: 09/01/13 11:59 PM.  Always use your most recent med list.               atorvastatin 10 MG tablet  Commonly known as:  LIPITOR  Take 10 mg by mouth daily.     lisinopril 20 MG tablet  Commonly known as:  PRINIVIL,ZESTRIL  Take 20 mg by mouth daily.     potassium chloride SA 20 MEQ tablet  Commonly known as:  K-DUR,KLOR-CON  Take 20 mEq by mouth daily.     raloxifene 60 MG tablet  Commonly known as:  EVISTA  Take 60 mg by mouth daily.     risperiDONE 2 MG tablet  Commonly known as:  RISPERDAL  Take 2 mg by mouth daily.     traMADol 50 MG tablet  Commonly known as:  ULTRAM  Take 50 mg by mouth as needed for pain.        Meds ordered this encounter  Medications  . lisinopril (PRINIVIL,ZESTRIL) 20 MG tablet    Sig: Take 20 mg by mouth daily.  . raloxifene (EVISTA) 60 MG tablet    Sig: Take 60 mg by mouth daily.  . risperiDONE (RISPERDAL) 2 MG tablet    Sig: Take 2 mg by mouth daily.  Marland Kitchen atorvastatin (LIPITOR) 10 MG tablet    Sig: Take 10 mg by mouth daily.  . potassium chloride SA (K-DUR,KLOR-CON) 20 MEQ tablet    Sig: Take 20 mEq by mouth daily.  . traMADol (ULTRAM) 50 MG tablet    Sig: Take 50 mg by mouth as needed for pain.    Past Surgical History  Procedure Laterality Date  . Cataract extraction    . Ankle surgery Right     x's  2     History  Substance Use Topics  . Smoking status: Current Every Day Smoker -- 0.25 packs/day    Types: Cigarettes  . Smokeless tobacco: Current User    Types: Snuff  . Alcohol Use: No    No family history on file.  Review of Systems  DATA OBTAINED: from patient GENERAL: Feels well no fevers, no fatigue, no changes in appetite SKIN: No itching, no rashes, no open lesions, no wounds EYES: No eye pain,no redness, no discharge EARS: No earache,no ringing of ears, no recent change in hearing NOSE: No congestion, no drainage, no bleeding  MOUTH/THROAT: No mouth pain, No sore throat, No difficulty chewing or swallowing  RESPIRATORY: No cough, no wheezing, no SOB CARDIAC: No chest pain,no heart palpitations,no new onset lower extremity edema  GI: No abdominal pain, No Nausea, no vomiting, no diarrhea, no heartburn or no reflux  GU: No dysuria, no increased frequency or urgency MUSCULOSKELETAL: No unrelieved bone/joint pain,  NEUROLOGIC: Awake, alert, appropriate to  situation, No change in mental status. PSYCHIATRIC: No overt anxiety or sadness.No behavior issue.  AMBULATION:  Ambulates unassisted  Filed Vitals:   09/01/13 0903  BP: 105/66  Pulse: 86  Resp: 16    Physical Exam  GENERAL APPEARANCE: Alert, conversant. Appropriately groomed. No acute distress.  VASCULAR: Pedal pulses palpable and strong bilateral.  Capillary refill time is immediate to all digits,  Proximal to distal cooling it warm to warm.  Digital hair growth is present bilateral  NEUROLOGIC: sensation is intact epicritically and protectively to 5.07 monofilament at 5/5 sites bilateral.  Light touch is intact bilateral, vibratory sensation intact bilateral, achilles tendon reflex is intact bilateral.  MUSCULOSKELETAL: acceptable muscle strength, tone and stability bilateral.  Intrinsic muscluature intact bilateral.  Rectus appearance of foot and digits noted bilateral.   DERMATOLOGIC: skin color, texture,  and turger are within normal limits.  No preulcerative lesions are seen, no interdigital maceration noted.  No open lesions present.  Digital nails are symptomatic and painful 1-5 bilateral.  Nails are thickened, discolored, dystrophic, friable, fragile and subungual debris is present x 10   Assessment  Painful mycotic toenails 1-5 B/L  Plan Treatment options discussed.  Nails x 10 debrided without complication.  Patient will return 3 months or prn   EGERTON, KATHRYN P, DPM

## 2013-09-01 NOTE — Patient Instructions (Signed)
Call if any ?'s or concerns

## 2013-09-06 ENCOUNTER — Ambulatory Visit: Payer: Self-pay | Admitting: Oncology

## 2013-09-06 DIAGNOSIS — Z79899 Other long term (current) drug therapy: Secondary | ICD-10-CM | POA: Diagnosis not present

## 2013-09-06 DIAGNOSIS — M810 Age-related osteoporosis without current pathological fracture: Secondary | ICD-10-CM | POA: Diagnosis not present

## 2013-09-06 DIAGNOSIS — D472 Monoclonal gammopathy: Secondary | ICD-10-CM | POA: Diagnosis not present

## 2013-09-06 DIAGNOSIS — I129 Hypertensive chronic kidney disease with stage 1 through stage 4 chronic kidney disease, or unspecified chronic kidney disease: Secondary | ICD-10-CM | POA: Diagnosis not present

## 2013-09-06 DIAGNOSIS — E785 Hyperlipidemia, unspecified: Secondary | ICD-10-CM | POA: Diagnosis not present

## 2013-09-06 DIAGNOSIS — M1990 Unspecified osteoarthritis, unspecified site: Secondary | ICD-10-CM | POA: Diagnosis not present

## 2013-09-06 DIAGNOSIS — D693 Immune thrombocytopenic purpura: Secondary | ICD-10-CM | POA: Diagnosis not present

## 2013-09-06 DIAGNOSIS — E739 Lactose intolerance, unspecified: Secondary | ICD-10-CM | POA: Diagnosis not present

## 2013-09-06 DIAGNOSIS — N189 Chronic kidney disease, unspecified: Secondary | ICD-10-CM | POA: Diagnosis not present

## 2013-09-06 DIAGNOSIS — F209 Schizophrenia, unspecified: Secondary | ICD-10-CM | POA: Diagnosis not present

## 2013-09-06 LAB — CBC CANCER CENTER
Basophil #: 0.1 x10 3/mm (ref 0.0–0.1)
Basophil %: 0.9 %
Eosinophil #: 0.2 x10 3/mm (ref 0.0–0.7)
Eosinophil %: 3.2 %
HGB: 13.6 g/dL (ref 12.0–16.0)
Lymphocyte #: 1.9 x10 3/mm (ref 1.0–3.6)
MCHC: 34.4 g/dL (ref 32.0–36.0)
Monocyte #: 0.7 x10 3/mm (ref 0.2–0.9)
Monocyte %: 8.7 %
Platelet: 191 x10 3/mm (ref 150–440)
RBC: 4.36 10*6/uL (ref 3.80–5.20)
RDW: 13.6 % (ref 11.5–14.5)
WBC: 7.7 x10 3/mm (ref 3.6–11.0)

## 2013-09-10 ENCOUNTER — Ambulatory Visit: Payer: Self-pay | Admitting: Oncology

## 2013-12-06 ENCOUNTER — Ambulatory Visit: Payer: Self-pay | Admitting: Oncology

## 2013-12-06 DIAGNOSIS — D472 Monoclonal gammopathy: Secondary | ICD-10-CM | POA: Diagnosis not present

## 2013-12-06 DIAGNOSIS — D693 Immune thrombocytopenic purpura: Secondary | ICD-10-CM | POA: Diagnosis not present

## 2013-12-07 LAB — CBC CANCER CENTER
BASOS ABS: 0.1 x10 3/mm (ref 0.0–0.1)
BASOS PCT: 1.1 %
Eosinophil #: 0.2 x10 3/mm (ref 0.0–0.7)
Eosinophil %: 2.7 %
HCT: 39.8 % (ref 35.0–47.0)
HGB: 13.2 g/dL (ref 12.0–16.0)
LYMPHS ABS: 1.7 x10 3/mm (ref 1.0–3.6)
Lymphocyte %: 22 %
MCH: 30.6 pg (ref 26.0–34.0)
MCHC: 33.2 g/dL (ref 32.0–36.0)
MCV: 92 fL (ref 80–100)
MONO ABS: 0.6 x10 3/mm (ref 0.2–0.9)
MONOS PCT: 7.1 %
Neutrophil #: 5.3 x10 3/mm (ref 1.4–6.5)
Neutrophil %: 67.1 %
Platelet: 158 x10 3/mm (ref 150–440)
RBC: 4.32 10*6/uL (ref 3.80–5.20)
RDW: 13.6 % (ref 11.5–14.5)
WBC: 7.8 x10 3/mm (ref 3.6–11.0)

## 2013-12-11 ENCOUNTER — Ambulatory Visit: Payer: Self-pay | Admitting: Oncology

## 2014-01-04 DIAGNOSIS — R7301 Impaired fasting glucose: Secondary | ICD-10-CM | POA: Diagnosis not present

## 2014-01-04 DIAGNOSIS — I10 Essential (primary) hypertension: Secondary | ICD-10-CM | POA: Diagnosis not present

## 2014-01-04 DIAGNOSIS — N183 Chronic kidney disease, stage 3 unspecified: Secondary | ICD-10-CM | POA: Diagnosis not present

## 2014-01-04 DIAGNOSIS — I129 Hypertensive chronic kidney disease with stage 1 through stage 4 chronic kidney disease, or unspecified chronic kidney disease: Secondary | ICD-10-CM | POA: Diagnosis not present

## 2014-01-04 DIAGNOSIS — G8929 Other chronic pain: Secondary | ICD-10-CM | POA: Diagnosis not present

## 2014-01-04 DIAGNOSIS — E785 Hyperlipidemia, unspecified: Secondary | ICD-10-CM | POA: Diagnosis not present

## 2014-01-18 DIAGNOSIS — H25049 Posterior subcapsular polar age-related cataract, unspecified eye: Secondary | ICD-10-CM | POA: Diagnosis not present

## 2014-01-31 DIAGNOSIS — N183 Chronic kidney disease, stage 3 unspecified: Secondary | ICD-10-CM | POA: Diagnosis not present

## 2014-01-31 DIAGNOSIS — E559 Vitamin D deficiency, unspecified: Secondary | ICD-10-CM | POA: Diagnosis not present

## 2014-01-31 DIAGNOSIS — D472 Monoclonal gammopathy: Secondary | ICD-10-CM | POA: Diagnosis not present

## 2014-01-31 DIAGNOSIS — I10 Essential (primary) hypertension: Secondary | ICD-10-CM | POA: Diagnosis not present

## 2014-03-09 ENCOUNTER — Ambulatory Visit: Payer: Self-pay | Admitting: Oncology

## 2014-03-09 DIAGNOSIS — M810 Age-related osteoporosis without current pathological fracture: Secondary | ICD-10-CM | POA: Diagnosis not present

## 2014-03-09 DIAGNOSIS — F209 Schizophrenia, unspecified: Secondary | ICD-10-CM | POA: Diagnosis not present

## 2014-03-09 DIAGNOSIS — D693 Immune thrombocytopenic purpura: Secondary | ICD-10-CM | POA: Diagnosis not present

## 2014-03-09 DIAGNOSIS — I129 Hypertensive chronic kidney disease with stage 1 through stage 4 chronic kidney disease, or unspecified chronic kidney disease: Secondary | ICD-10-CM | POA: Diagnosis not present

## 2014-03-09 DIAGNOSIS — Z79899 Other long term (current) drug therapy: Secondary | ICD-10-CM | POA: Diagnosis not present

## 2014-03-09 DIAGNOSIS — N189 Chronic kidney disease, unspecified: Secondary | ICD-10-CM | POA: Diagnosis not present

## 2014-03-09 DIAGNOSIS — Z8249 Family history of ischemic heart disease and other diseases of the circulatory system: Secondary | ICD-10-CM | POA: Diagnosis not present

## 2014-03-09 DIAGNOSIS — D472 Monoclonal gammopathy: Secondary | ICD-10-CM | POA: Diagnosis not present

## 2014-03-09 DIAGNOSIS — E785 Hyperlipidemia, unspecified: Secondary | ICD-10-CM | POA: Diagnosis not present

## 2014-03-09 DIAGNOSIS — Z841 Family history of disorders of kidney and ureter: Secondary | ICD-10-CM | POA: Diagnosis not present

## 2014-03-09 DIAGNOSIS — M1990 Unspecified osteoarthritis, unspecified site: Secondary | ICD-10-CM | POA: Diagnosis not present

## 2014-03-09 LAB — CBC CANCER CENTER
BASOS PCT: 1.1 %
Basophil #: 0.1 x10 3/mm (ref 0.0–0.1)
EOS PCT: 1.4 %
Eosinophil #: 0.1 x10 3/mm (ref 0.0–0.7)
HCT: 39.3 % (ref 35.0–47.0)
HGB: 13.4 g/dL (ref 12.0–16.0)
LYMPHS ABS: 1.8 x10 3/mm (ref 1.0–3.6)
Lymphocyte %: 25.3 %
MCH: 30.7 pg (ref 26.0–34.0)
MCHC: 34.2 g/dL (ref 32.0–36.0)
MCV: 90 fL (ref 80–100)
MONOS PCT: 7.7 %
Monocyte #: 0.5 x10 3/mm (ref 0.2–0.9)
NEUTROS ABS: 4.6 x10 3/mm (ref 1.4–6.5)
NEUTROS PCT: 64.5 %
PLATELETS: 151 x10 3/mm (ref 150–440)
RBC: 4.37 10*6/uL (ref 3.80–5.20)
RDW: 13.2 % (ref 11.5–14.5)
WBC: 7.1 x10 3/mm (ref 3.6–11.0)

## 2014-03-10 ENCOUNTER — Ambulatory Visit: Payer: Self-pay | Admitting: Oncology

## 2014-03-22 DIAGNOSIS — F209 Schizophrenia, unspecified: Secondary | ICD-10-CM | POA: Diagnosis not present

## 2014-04-11 DIAGNOSIS — E785 Hyperlipidemia, unspecified: Secondary | ICD-10-CM | POA: Diagnosis not present

## 2014-04-11 DIAGNOSIS — I10 Essential (primary) hypertension: Secondary | ICD-10-CM | POA: Diagnosis not present

## 2014-04-11 DIAGNOSIS — N183 Chronic kidney disease, stage 3 unspecified: Secondary | ICD-10-CM | POA: Diagnosis not present

## 2014-04-11 DIAGNOSIS — M4800 Spinal stenosis, site unspecified: Secondary | ICD-10-CM | POA: Diagnosis not present

## 2014-04-11 DIAGNOSIS — M79609 Pain in unspecified limb: Secondary | ICD-10-CM | POA: Diagnosis not present

## 2014-04-11 DIAGNOSIS — I129 Hypertensive chronic kidney disease with stage 1 through stage 4 chronic kidney disease, or unspecified chronic kidney disease: Secondary | ICD-10-CM | POA: Diagnosis not present

## 2014-04-26 ENCOUNTER — Ambulatory Visit: Payer: Self-pay

## 2014-04-26 DIAGNOSIS — M545 Low back pain, unspecified: Secondary | ICD-10-CM | POA: Diagnosis not present

## 2014-04-26 DIAGNOSIS — M47817 Spondylosis without myelopathy or radiculopathy, lumbosacral region: Secondary | ICD-10-CM | POA: Diagnosis not present

## 2014-04-26 DIAGNOSIS — M5137 Other intervertebral disc degeneration, lumbosacral region: Secondary | ICD-10-CM | POA: Diagnosis not present

## 2014-05-25 DIAGNOSIS — E872 Acidosis, unspecified: Secondary | ICD-10-CM | POA: Diagnosis not present

## 2014-05-25 DIAGNOSIS — D472 Monoclonal gammopathy: Secondary | ICD-10-CM | POA: Diagnosis not present

## 2014-05-25 DIAGNOSIS — I10 Essential (primary) hypertension: Secondary | ICD-10-CM | POA: Diagnosis not present

## 2014-05-25 DIAGNOSIS — N183 Chronic kidney disease, stage 3 unspecified: Secondary | ICD-10-CM | POA: Diagnosis not present

## 2014-06-05 DIAGNOSIS — N183 Chronic kidney disease, stage 3 unspecified: Secondary | ICD-10-CM | POA: Diagnosis not present

## 2014-06-05 DIAGNOSIS — R7301 Impaired fasting glucose: Secondary | ICD-10-CM | POA: Diagnosis not present

## 2014-06-05 DIAGNOSIS — M545 Low back pain, unspecified: Secondary | ICD-10-CM | POA: Diagnosis not present

## 2014-06-05 DIAGNOSIS — I129 Hypertensive chronic kidney disease with stage 1 through stage 4 chronic kidney disease, or unspecified chronic kidney disease: Secondary | ICD-10-CM | POA: Diagnosis not present

## 2014-06-05 DIAGNOSIS — F17200 Nicotine dependence, unspecified, uncomplicated: Secondary | ICD-10-CM | POA: Diagnosis not present

## 2014-06-05 DIAGNOSIS — E8881 Metabolic syndrome: Secondary | ICD-10-CM | POA: Diagnosis not present

## 2014-06-22 ENCOUNTER — Ambulatory Visit: Payer: Self-pay

## 2014-06-22 DIAGNOSIS — M48061 Spinal stenosis, lumbar region without neurogenic claudication: Secondary | ICD-10-CM | POA: Diagnosis not present

## 2014-06-22 DIAGNOSIS — M545 Low back pain, unspecified: Secondary | ICD-10-CM | POA: Diagnosis not present

## 2014-06-22 DIAGNOSIS — IMO0002 Reserved for concepts with insufficient information to code with codable children: Secondary | ICD-10-CM | POA: Diagnosis not present

## 2014-06-23 ENCOUNTER — Ambulatory Visit: Payer: Self-pay | Admitting: Oncology

## 2014-06-23 DIAGNOSIS — D693 Immune thrombocytopenic purpura: Secondary | ICD-10-CM | POA: Diagnosis not present

## 2014-06-23 DIAGNOSIS — D472 Monoclonal gammopathy: Secondary | ICD-10-CM | POA: Diagnosis not present

## 2014-06-23 LAB — BASIC METABOLIC PANEL
ANION GAP: 10 (ref 7–16)
BUN: 12 mg/dL (ref 7–18)
CALCIUM: 8.1 mg/dL — AB (ref 8.5–10.1)
CO2: 21 mmol/L (ref 21–32)
Chloride: 98 mmol/L (ref 98–107)
Creatinine: 1.57 mg/dL — ABNORMAL HIGH (ref 0.60–1.30)
EGFR (African American): 40 — ABNORMAL LOW
EGFR (Non-African Amer.): 34 — ABNORMAL LOW
Glucose: 97 mg/dL (ref 65–99)
OSMOLALITY: 259 (ref 275–301)
POTASSIUM: 5 mmol/L (ref 3.5–5.1)
Sodium: 129 mmol/L — ABNORMAL LOW (ref 136–145)

## 2014-06-23 LAB — CBC CANCER CENTER
BASOS PCT: 1 %
Basophil #: 0.1 x10 3/mm (ref 0.0–0.1)
Eosinophil #: 0.1 x10 3/mm (ref 0.0–0.7)
Eosinophil %: 2.6 %
HCT: 35.7 % (ref 35.0–47.0)
HGB: 11.9 g/dL — ABNORMAL LOW (ref 12.0–16.0)
Lymphocyte #: 1.7 x10 3/mm (ref 1.0–3.6)
Lymphocyte %: 30.2 %
MCH: 30.5 pg (ref 26.0–34.0)
MCHC: 33.2 g/dL (ref 32.0–36.0)
MCV: 92 fL (ref 80–100)
Monocyte #: 0.5 x10 3/mm (ref 0.2–0.9)
Monocyte %: 8.8 %
NEUTROS ABS: 3.1 x10 3/mm (ref 1.4–6.5)
NEUTROS PCT: 57.4 %
Platelet: 81 x10 3/mm — ABNORMAL LOW (ref 150–440)
RBC: 3.89 10*6/uL (ref 3.80–5.20)
RDW: 13.6 % (ref 11.5–14.5)
WBC: 5.5 x10 3/mm (ref 3.6–11.0)

## 2014-06-26 DIAGNOSIS — Z1231 Encounter for screening mammogram for malignant neoplasm of breast: Secondary | ICD-10-CM | POA: Diagnosis not present

## 2014-06-27 LAB — PROT IMMUNOELECTROPHORES(ARMC)

## 2014-07-11 ENCOUNTER — Ambulatory Visit: Payer: Self-pay | Admitting: Internal Medicine

## 2014-07-11 ENCOUNTER — Ambulatory Visit: Payer: Self-pay | Admitting: Oncology

## 2014-09-05 DIAGNOSIS — M4800 Spinal stenosis, site unspecified: Secondary | ICD-10-CM | POA: Diagnosis not present

## 2014-09-05 DIAGNOSIS — I129 Hypertensive chronic kidney disease with stage 1 through stage 4 chronic kidney disease, or unspecified chronic kidney disease: Secondary | ICD-10-CM | POA: Diagnosis not present

## 2014-09-08 ENCOUNTER — Ambulatory Visit: Payer: Self-pay | Admitting: Oncology

## 2014-09-08 DIAGNOSIS — M1990 Unspecified osteoarthritis, unspecified site: Secondary | ICD-10-CM | POA: Diagnosis not present

## 2014-09-08 DIAGNOSIS — F209 Schizophrenia, unspecified: Secondary | ICD-10-CM | POA: Diagnosis not present

## 2014-09-08 DIAGNOSIS — D693 Immune thrombocytopenic purpura: Secondary | ICD-10-CM | POA: Diagnosis not present

## 2014-09-08 DIAGNOSIS — I129 Hypertensive chronic kidney disease with stage 1 through stage 4 chronic kidney disease, or unspecified chronic kidney disease: Secondary | ICD-10-CM | POA: Diagnosis not present

## 2014-09-08 DIAGNOSIS — Z79899 Other long term (current) drug therapy: Secondary | ICD-10-CM | POA: Diagnosis not present

## 2014-09-08 DIAGNOSIS — E785 Hyperlipidemia, unspecified: Secondary | ICD-10-CM | POA: Diagnosis not present

## 2014-09-08 DIAGNOSIS — N189 Chronic kidney disease, unspecified: Secondary | ICD-10-CM | POA: Diagnosis not present

## 2014-09-08 DIAGNOSIS — M818 Other osteoporosis without current pathological fracture: Secondary | ICD-10-CM | POA: Diagnosis not present

## 2014-09-08 DIAGNOSIS — D472 Monoclonal gammopathy: Secondary | ICD-10-CM | POA: Diagnosis not present

## 2014-09-08 LAB — CBC CANCER CENTER
BASOS ABS: 0.1 x10 3/mm (ref 0.0–0.1)
Basophil %: 1.1 %
EOS PCT: 2.7 %
Eosinophil #: 0.1 x10 3/mm (ref 0.0–0.7)
HCT: 38.6 % (ref 35.0–47.0)
HGB: 12.6 g/dL (ref 12.0–16.0)
LYMPHS ABS: 1.3 x10 3/mm (ref 1.0–3.6)
Lymphocyte %: 26.4 %
MCH: 30.2 pg (ref 26.0–34.0)
MCHC: 32.8 g/dL (ref 32.0–36.0)
MCV: 92 fL (ref 80–100)
MONO ABS: 0.3 x10 3/mm (ref 0.2–0.9)
Monocyte %: 7.1 %
NEUTROS ABS: 3.1 x10 3/mm (ref 1.4–6.5)
Neutrophil %: 62.7 %
Platelet: 99 x10 3/mm — ABNORMAL LOW (ref 150–440)
RBC: 4.18 10*6/uL (ref 3.80–5.20)
RDW: 14 % (ref 11.5–14.5)
WBC: 4.9 x10 3/mm (ref 3.6–11.0)

## 2014-09-10 ENCOUNTER — Ambulatory Visit: Payer: Self-pay | Admitting: Oncology

## 2014-09-20 DIAGNOSIS — M4806 Spinal stenosis, lumbar region: Secondary | ICD-10-CM | POA: Diagnosis not present

## 2014-09-20 DIAGNOSIS — M4726 Other spondylosis with radiculopathy, lumbar region: Secondary | ICD-10-CM | POA: Diagnosis not present

## 2014-09-29 DIAGNOSIS — M5416 Radiculopathy, lumbar region: Secondary | ICD-10-CM | POA: Insufficient documentation

## 2014-09-29 DIAGNOSIS — M5126 Other intervertebral disc displacement, lumbar region: Secondary | ICD-10-CM | POA: Insufficient documentation

## 2014-09-29 DIAGNOSIS — M5116 Intervertebral disc disorders with radiculopathy, lumbar region: Secondary | ICD-10-CM | POA: Insufficient documentation

## 2014-10-16 DIAGNOSIS — Z23 Encounter for immunization: Secondary | ICD-10-CM | POA: Diagnosis not present

## 2014-10-16 DIAGNOSIS — M4800 Spinal stenosis, site unspecified: Secondary | ICD-10-CM | POA: Diagnosis not present

## 2014-10-27 DIAGNOSIS — I10 Essential (primary) hypertension: Secondary | ICD-10-CM | POA: Diagnosis not present

## 2014-10-27 DIAGNOSIS — D472 Monoclonal gammopathy: Secondary | ICD-10-CM | POA: Diagnosis not present

## 2014-10-27 DIAGNOSIS — N183 Chronic kidney disease, stage 3 (moderate): Secondary | ICD-10-CM | POA: Diagnosis not present

## 2014-10-27 DIAGNOSIS — E872 Acidosis: Secondary | ICD-10-CM | POA: Diagnosis not present

## 2014-10-30 DIAGNOSIS — N390 Urinary tract infection, site not specified: Secondary | ICD-10-CM | POA: Diagnosis not present

## 2014-10-30 DIAGNOSIS — N200 Calculus of kidney: Secondary | ICD-10-CM | POA: Diagnosis not present

## 2014-10-30 DIAGNOSIS — R319 Hematuria, unspecified: Secondary | ICD-10-CM | POA: Diagnosis not present

## 2014-11-29 DIAGNOSIS — R748 Abnormal levels of other serum enzymes: Secondary | ICD-10-CM | POA: Diagnosis not present

## 2014-11-29 DIAGNOSIS — R312 Other microscopic hematuria: Secondary | ICD-10-CM | POA: Diagnosis not present

## 2014-11-29 DIAGNOSIS — R109 Unspecified abdominal pain: Secondary | ICD-10-CM | POA: Diagnosis not present

## 2014-11-30 LAB — FECAL OCCULT BLOOD, IMMUNOCHEMICAL: IFOBT: NEGATIVE

## 2014-12-19 ENCOUNTER — Ambulatory Visit: Payer: Self-pay | Admitting: Oncology

## 2014-12-19 DIAGNOSIS — M818 Other osteoporosis without current pathological fracture: Secondary | ICD-10-CM | POA: Diagnosis not present

## 2014-12-19 DIAGNOSIS — F209 Schizophrenia, unspecified: Secondary | ICD-10-CM | POA: Diagnosis not present

## 2014-12-19 DIAGNOSIS — Z79899 Other long term (current) drug therapy: Secondary | ICD-10-CM | POA: Diagnosis not present

## 2014-12-19 DIAGNOSIS — N189 Chronic kidney disease, unspecified: Secondary | ICD-10-CM | POA: Diagnosis not present

## 2014-12-19 DIAGNOSIS — I129 Hypertensive chronic kidney disease with stage 1 through stage 4 chronic kidney disease, or unspecified chronic kidney disease: Secondary | ICD-10-CM | POA: Diagnosis not present

## 2014-12-19 DIAGNOSIS — D693 Immune thrombocytopenic purpura: Secondary | ICD-10-CM | POA: Diagnosis not present

## 2014-12-19 DIAGNOSIS — E785 Hyperlipidemia, unspecified: Secondary | ICD-10-CM | POA: Diagnosis not present

## 2014-12-19 DIAGNOSIS — M1990 Unspecified osteoarthritis, unspecified site: Secondary | ICD-10-CM | POA: Diagnosis not present

## 2014-12-19 DIAGNOSIS — D472 Monoclonal gammopathy: Secondary | ICD-10-CM | POA: Diagnosis not present

## 2015-01-09 ENCOUNTER — Ambulatory Visit
Admit: 2015-01-09 | Disposition: A | Discharge status: Home / Self Care | Payer: Self-pay | Attending: Oncology | Admitting: Oncology

## 2015-01-10 DIAGNOSIS — R634 Abnormal weight loss: Secondary | ICD-10-CM | POA: Diagnosis not present

## 2015-01-10 DIAGNOSIS — R52 Pain, unspecified: Secondary | ICD-10-CM | POA: Diagnosis not present

## 2015-01-22 DIAGNOSIS — H2589 Other age-related cataract: Secondary | ICD-10-CM | POA: Diagnosis not present

## 2015-02-08 ENCOUNTER — Ambulatory Visit
Admit: 2015-02-08 | Disposition: A | Discharge status: Home / Self Care | Payer: Self-pay | Attending: Ophthalmology | Admitting: Ophthalmology

## 2015-02-08 DIAGNOSIS — Z01810 Encounter for preprocedural cardiovascular examination: Secondary | ICD-10-CM | POA: Diagnosis not present

## 2015-02-08 DIAGNOSIS — I10 Essential (primary) hypertension: Secondary | ICD-10-CM | POA: Diagnosis not present

## 2015-02-08 DIAGNOSIS — H269 Unspecified cataract: Secondary | ICD-10-CM | POA: Diagnosis not present

## 2015-02-08 DIAGNOSIS — H2589 Other age-related cataract: Secondary | ICD-10-CM | POA: Diagnosis not present

## 2015-02-20 ENCOUNTER — Ambulatory Visit
Admit: 2015-02-20 | Disposition: A | Discharge status: Home / Self Care | Payer: Self-pay | Attending: Ophthalmology | Admitting: Ophthalmology

## 2015-02-20 DIAGNOSIS — Z79899 Other long term (current) drug therapy: Secondary | ICD-10-CM | POA: Diagnosis not present

## 2015-02-20 DIAGNOSIS — M810 Age-related osteoporosis without current pathological fracture: Secondary | ICD-10-CM | POA: Diagnosis not present

## 2015-02-20 DIAGNOSIS — F329 Major depressive disorder, single episode, unspecified: Secondary | ICD-10-CM | POA: Diagnosis not present

## 2015-02-20 DIAGNOSIS — H2511 Age-related nuclear cataract, right eye: Secondary | ICD-10-CM | POA: Diagnosis not present

## 2015-02-20 DIAGNOSIS — E780 Pure hypercholesterolemia: Secondary | ICD-10-CM | POA: Diagnosis not present

## 2015-02-20 DIAGNOSIS — J45909 Unspecified asthma, uncomplicated: Secondary | ICD-10-CM | POA: Diagnosis not present

## 2015-02-20 DIAGNOSIS — F17200 Nicotine dependence, unspecified, uncomplicated: Secondary | ICD-10-CM | POA: Diagnosis not present

## 2015-02-20 DIAGNOSIS — I10 Essential (primary) hypertension: Secondary | ICD-10-CM | POA: Diagnosis not present

## 2015-02-20 DIAGNOSIS — H2589 Other age-related cataract: Secondary | ICD-10-CM | POA: Diagnosis not present

## 2015-02-20 DIAGNOSIS — M1990 Unspecified osteoarthritis, unspecified site: Secondary | ICD-10-CM | POA: Diagnosis not present

## 2015-03-02 NOTE — Op Note (Signed)
PATIENT NAME:  Denise Everett, Denise Everett MR#:  [id number] DATE OF BIRTH:  12-31-1950  DATE OF PROCEDURE:  06/28/2013  PREOPERATIVE DIAGNOSIS: Visually significant cataract of the left eye.   POSTOPERATIVE DIAGNOSIS: Visually significant cataract of the left  eye.   OPERATIVE PROCEDURE: Cataract extraction by phacoemulsification with implant of intraocular lens to left eye.   SURGEON: Birder Robson, MD.   ANESTHESIA:  1. Managed anesthesia care.  2. Topical tetracaine drops followed by 2% Xylocaine jelly applied in the preoperative holding area.   COMPLICATIONS: None.   TECHNIQUE:  Stop and chop.    DESCRIPTION OF PROCEDURE: The patient was examined and consented in the preoperative holding area where the aforementioned topical anesthesia was applied to the left eye and then brought back to the Operating Room where the left eye was prepped and draped in the usual sterile ophthalmic fashion and a lid speculum was placed. A paracentesis was created with the side port blade and the anterior chamber was filled with viscoelastic. A near clear corneal incision was performed with the steel keratome. A continuous curvilinear capsulorrhexis was performed with a cystotome followed by the capsulorrhexis forceps. Hydrodissection and hydrodelineation were carried out with BSS on a blunt cannula. The lens was removed in a stop and chop  technique and the remaining cortical material was removed with the irrigation-aspiration handpiece. The capsular bag was inflated with viscoelastic and the Tecnis ZCB00 23.5 diopter lens, serial [id number] was placed in the capsular bag without complication. The remaining viscoelastic was removed from the eye with the irrigation-aspiration handpiece. The wounds were hydrated. The anterior chamber was flushed with Miostat and the eye was inflated to physiologic pressure. 0.1 mL of cefuroxime concentration 10 mg/mL was placed in the anterior chamber. The wounds were found to be water  tight. The eye was dressed with Vigamox. The patient was given protective glasses to wear throughout the day and a shield with which to sleep tonight. The patient was also given drops with which to begin a drop regimen today and will follow-up with me in one day.       ____________________________ Livingston Diones. Sakari Alkhatib, MD wlp:cc D: 06/28/2013 14:24:07 ET T: 06/28/2013 17:44:06 ET JOB#: [id number]  cc: Rashelle Ireland L. Donovan Gatchel, MD, <Dictator> Livingston Diones Shanikka Wonders MD ELECTRONICALLY SIGNED 06/29/2013 13:37

## 2015-03-11 NOTE — Op Note (Signed)
PATIENT NAME:  Denise Everett, Denise Everett MR#:  [id number] DATE OF BIRTH:  05-Nov-1951  PREOPERATIVE DIAGNOSIS:  Nuclear sclerotic cataract of the right eye.   POSTOPERATIVE DIAGNOSIS:  Nuclear sclerotic cataract of the right eye.   OPERATIVE PROCEDURE:  Cataract extraction by phacoemulsification with implant of intraocular lens to right eye.   SURGEON:  Birder Robson, MD.   ANESTHESIA:  1. Managed anesthesia care.  2. Topical tetracaine drops followed by 2% Xylocaine jelly applied in the preoperative holding area.   COMPLICATIONS:  None.   TECHNIQUE:   Stop and chop.   DESCRIPTION OF PROCEDURE:  The patient was examined and consented in the preoperative holding area where the aforementioned topical anesthesia was applied to the right eye and then brought back to the Operating Room where the right eye was prepped and draped in the usual sterile ophthalmic fashion and a lid speculum was placed. A paracentesis was created with the side port blade and the anterior chamber was filled with viscoelastic. A near clear corneal incision was performed with the steel keratome. A continuous curvilinear capsulorrhexis was performed with a cystotome followed by the capsulorrhexis forceps. Hydrodissection and hydrodelineation were carried out with BSS on a blunt cannula. The lens was removed in a stop and chop technique and the remaining cortical material was removed with the irrigation-aspiration handpiece. The capsular bag was inflated with viscoelastic and the Tecnis  ZCB00 24.0-diopter lens, serial number [id number], was placed in the capsular bag without complication. The remaining viscoelastic was removed from the eye with the irrigation-aspiration handpiece. The wounds were hydrated. The anterior chamber was flushed with Miostat and the eye was inflated to physiologic pressure. 0.1 mL of cefuroxime concentration 10 mg/mL was placed in the anterior chamber. The wounds were found to be water tight. The eye was dressed  with Vigamox. The patient was given protective glasses to wear throughout the day and a shield with which to sleep tonight. The patient was also given drops with which to begin a drop regimen today and will follow-up with me in one day.    ____________________________ Livingston Diones. Micheline Markes, MD wlp:kc D: 02/20/2015 21:40:49 ET T: 02/20/2015 22:45:24 ET JOB#: [id number]  cc: Ashten Prats L. Neveyah Garzon, MD, <Dictator> Livingston Diones Kourtland Coopman MD ELECTRONICALLY SIGNED 02/21/2015 11:07

## 2015-03-20 ENCOUNTER — Inpatient Hospital Stay: Payer: Medicare Other | Attending: Oncology

## 2015-03-20 DIAGNOSIS — D693 Immune thrombocytopenic purpura: Secondary | ICD-10-CM | POA: Insufficient documentation

## 2015-03-20 DIAGNOSIS — D472 Monoclonal gammopathy: Secondary | ICD-10-CM | POA: Diagnosis not present

## 2015-03-20 LAB — CBC WITH DIFFERENTIAL/PLATELET
Basophils Absolute: 0.1 10*3/uL (ref 0–0.1)
EOS ABS: 0.1 10*3/uL (ref 0–0.7)
HCT: 37.9 % (ref 35.0–47.0)
HEMOGLOBIN: 12.5 g/dL (ref 12.0–16.0)
LYMPHS ABS: 1.4 10*3/uL (ref 1.0–3.6)
Lymphocytes Relative: 26 %
MCH: 29.8 pg (ref 26.0–34.0)
MCHC: 33 g/dL (ref 32.0–36.0)
MCV: 90.3 fL (ref 80.0–100.0)
MONO ABS: 0.5 10*3/uL (ref 0.2–0.9)
Neutro Abs: 3.3 10*3/uL (ref 1.4–6.5)
Neutrophils Relative %: 62 %
Platelets: 68 10*3/uL — ABNORMAL LOW (ref 150–440)
RBC: 4.2 MIL/uL (ref 3.80–5.20)
RDW: 13.3 % (ref 11.5–14.5)
WBC: 5.2 10*3/uL (ref 3.6–11.0)

## 2015-04-03 DIAGNOSIS — I10 Essential (primary) hypertension: Secondary | ICD-10-CM | POA: Diagnosis not present

## 2015-04-03 DIAGNOSIS — D472 Monoclonal gammopathy: Secondary | ICD-10-CM | POA: Diagnosis not present

## 2015-04-03 DIAGNOSIS — E872 Acidosis: Secondary | ICD-10-CM | POA: Diagnosis not present

## 2015-04-03 DIAGNOSIS — N183 Chronic kidney disease, stage 3 (moderate): Secondary | ICD-10-CM | POA: Diagnosis not present

## 2015-04-04 DIAGNOSIS — M79609 Pain in unspecified limb: Secondary | ICD-10-CM | POA: Diagnosis not present

## 2015-04-04 DIAGNOSIS — M4800 Spinal stenosis, site unspecified: Secondary | ICD-10-CM | POA: Diagnosis not present

## 2015-04-13 ENCOUNTER — Other Ambulatory Visit: Payer: Self-pay | Admitting: Unknown Physician Specialty

## 2015-04-27 ENCOUNTER — Other Ambulatory Visit: Payer: Self-pay | Admitting: Unknown Physician Specialty

## 2015-04-27 MED ORDER — HYDROCODONE-ACETAMINOPHEN 7.5-325 MG PO TABS: 1.0000 | ORAL_TABLET | Freq: Two times a day (BID) | ORAL | Status: DC

## 2015-05-02 ENCOUNTER — Other Ambulatory Visit: Payer: Self-pay | Admitting: Oncology

## 2015-05-02 DIAGNOSIS — D472 Monoclonal gammopathy: Secondary | ICD-10-CM

## 2015-05-15 ENCOUNTER — Other Ambulatory Visit: Payer: Self-pay | Admitting: Unknown Physician Specialty

## 2015-05-25 ENCOUNTER — Other Ambulatory Visit: Payer: Self-pay | Admitting: Unknown Physician Specialty

## 2015-05-25 MED ORDER — HYDROCODONE-ACETAMINOPHEN 7.5-325 MG PO TABS: 1.0000 | ORAL_TABLET | Freq: Two times a day (BID) | ORAL | Status: DC

## 2015-06-07 DIAGNOSIS — N183 Chronic kidney disease, stage 3 unspecified: Secondary | ICD-10-CM

## 2015-06-07 DIAGNOSIS — I129 Hypertensive chronic kidney disease with stage 1 through stage 4 chronic kidney disease, or unspecified chronic kidney disease: Secondary | ICD-10-CM | POA: Insufficient documentation

## 2015-06-07 DIAGNOSIS — D693 Immune thrombocytopenic purpura: Secondary | ICD-10-CM | POA: Insufficient documentation

## 2015-06-07 DIAGNOSIS — F17200 Nicotine dependence, unspecified, uncomplicated: Secondary | ICD-10-CM

## 2015-06-07 DIAGNOSIS — I10 Essential (primary) hypertension: Secondary | ICD-10-CM | POA: Insufficient documentation

## 2015-06-07 DIAGNOSIS — M810 Age-related osteoporosis without current pathological fracture: Secondary | ICD-10-CM | POA: Insufficient documentation

## 2015-06-07 DIAGNOSIS — F17210 Nicotine dependence, cigarettes, uncomplicated: Secondary | ICD-10-CM | POA: Insufficient documentation

## 2015-06-07 DIAGNOSIS — M79606 Pain in leg, unspecified: Secondary | ICD-10-CM | POA: Insufficient documentation

## 2015-06-07 DIAGNOSIS — D696 Thrombocytopenia, unspecified: Secondary | ICD-10-CM

## 2015-06-07 DIAGNOSIS — J309 Allergic rhinitis, unspecified: Secondary | ICD-10-CM

## 2015-06-07 DIAGNOSIS — M545 Low back pain: Secondary | ICD-10-CM

## 2015-06-07 DIAGNOSIS — R634 Abnormal weight loss: Secondary | ICD-10-CM

## 2015-06-07 DIAGNOSIS — F488 Other specified nonpsychotic mental disorders: Secondary | ICD-10-CM

## 2015-06-07 DIAGNOSIS — G8929 Other chronic pain: Secondary | ICD-10-CM

## 2015-06-07 DIAGNOSIS — Z87891 Personal history of nicotine dependence: Secondary | ICD-10-CM | POA: Insufficient documentation

## 2015-06-07 DIAGNOSIS — M5442 Lumbago with sciatica, left side: Secondary | ICD-10-CM

## 2015-06-07 DIAGNOSIS — M4800 Spinal stenosis, site unspecified: Secondary | ICD-10-CM

## 2015-06-07 DIAGNOSIS — E872 Acidosis, unspecified: Secondary | ICD-10-CM

## 2015-06-07 DIAGNOSIS — E785 Hyperlipidemia, unspecified: Secondary | ICD-10-CM | POA: Insufficient documentation

## 2015-06-07 DIAGNOSIS — F209 Schizophrenia, unspecified: Secondary | ICD-10-CM | POA: Insufficient documentation

## 2015-06-07 DIAGNOSIS — M5441 Lumbago with sciatica, right side: Secondary | ICD-10-CM

## 2015-06-07 HISTORY — DX: Abnormal weight loss: R63.4

## 2015-06-08 ENCOUNTER — Encounter: Payer: Self-pay | Admitting: Unknown Physician Specialty

## 2015-06-08 ENCOUNTER — Ambulatory Visit (INDEPENDENT_AMBULATORY_CARE_PROVIDER_SITE_OTHER): Payer: Medicare Other | Admitting: Unknown Physician Specialty

## 2015-06-08 VITALS — BP 91/64 | HR 61 | Temp 98.0°F | Ht 62.0 in | Wt 138.6 lb

## 2015-06-08 DIAGNOSIS — D696 Thrombocytopenia, unspecified: Secondary | ICD-10-CM

## 2015-06-08 DIAGNOSIS — N183 Chronic kidney disease, stage 3 unspecified: Secondary | ICD-10-CM

## 2015-06-08 DIAGNOSIS — E785 Hyperlipidemia, unspecified: Secondary | ICD-10-CM

## 2015-06-08 DIAGNOSIS — Z0000 Encounter for general adult medical examination without abnormal findings: Secondary | ICD-10-CM

## 2015-06-08 DIAGNOSIS — M4807 Spinal stenosis, lumbosacral region: Secondary | ICD-10-CM | POA: Diagnosis not present

## 2015-06-08 DIAGNOSIS — I10 Essential (primary) hypertension: Secondary | ICD-10-CM | POA: Diagnosis not present

## 2015-06-08 DIAGNOSIS — M79606 Pain in leg, unspecified: Secondary | ICD-10-CM

## 2015-06-08 MED ORDER — HYDROCODONE-ACETAMINOPHEN 7.5-325 MG PO TABS: 1.0000 | ORAL_TABLET | Freq: Two times a day (BID) | ORAL | Status: DC

## 2015-06-08 MED ORDER — LISINOPRIL 10 MG PO TABS: 10.0000 mg | ORAL_TABLET | Freq: Every day | ORAL | Status: DC

## 2015-06-08 NOTE — Progress Notes (Signed)
BP 91/64 mmHg  Pulse 61  Temp(Src) 98 F (36.7 C)  Ht 5\' 2"  (1.575 m)  Wt 138 lb 9.6 oz (62.869 kg)  BMI 25.34 kg/m2  SpO2 98%  LMP  (LMP Unknown)   Subjective:    Patient ID: Denise Everett, female    DOB: 1951/09/07, 64 y.o.   MRN: [id number]  HPI: Denise Everett is a 64 y.o. female  Chief Complaint  Patient presents with  . Medicare Wellness   Chronic pain: Taking medications once or twice a day.  Stable.  Pt states she walks better and more when she is on pain medication.  She is currently on monthly refills to keep track of use.  She does not drive.    Hypercholesterol: No changes in pain on or off cholesterol medication.  Doing well with current medication  Hpertension:  BP is 91/64 today.  One episode of dizzyness in the hot weather.  Tolerating medications well.   Thrombocytopenia: Followed at cancer center  Chronic Kidney disease: monitored through nephrology   Relevant past medical, surgical, family and social history reviewed and updated as indicated. Interim medical history since our last visit reviewed. Allergies and medications reviewed and updated.  Review of Systems  Constitutional: Negative.   HENT: Negative.   Eyes: Negative.   Respiratory: Negative.   Cardiovascular: Negative.   Gastrointestinal: Negative.   Endocrine: Negative.   Genitourinary: Negative.   Musculoskeletal: Negative.        States her only problem is her leg pain  Skin: Negative.   Allergic/Immunologic: Negative.   Neurological: Negative.   Hematological: Negative.   Psychiatric/Behavioral: Negative.     Per HPI unless specifically indicated above     Objective:    BP 91/64 mmHg  Pulse 61  Temp(Src) 98 F (36.7 C)  Ht 5\' 2"  (1.575 m)  Wt 138 lb 9.6 oz (62.869 kg)  BMI 25.34 kg/m2  SpO2 98%  LMP  (LMP Unknown)  Wt Readings from Last 3 Encounters:  06/08/15 138 lb 9.6 oz (62.869 kg)  04/04/15 141 lb (63.957 kg)  09/01/13 146 lb (66.225 kg)    Physical Exam   Constitutional: She is oriented to person, place, and time. She appears well-developed and well-nourished.  HENT:  Head: Normocephalic and atraumatic.  Eyes: Pupils are equal, round, and reactive to light. Right eye exhibits no discharge. Left eye exhibits no discharge. No scleral icterus.  Neck: Normal range of motion. Neck supple. Carotid bruit is not present. No thyromegaly present.  Cardiovascular: Normal rate, regular rhythm and normal heart sounds.  Exam reveals no gallop and no friction rub.   No murmur heard. Pulmonary/Chest: Effort normal and breath sounds normal. No respiratory distress. She has no wheezes. She has no rales.  Abdominal: Soft. Bowel sounds are normal. There is no tenderness. There is no rebound.  Genitourinary: No breast swelling, tenderness or discharge.  Musculoskeletal: Normal range of motion.  Lymphadenopathy:    She has no cervical adenopathy.  Neurological: She is alert and oriented to person, place, and time.  Skin: Skin is warm, dry and intact. No rash noted.  Psychiatric: She has a normal mood and affect. Her speech is normal and behavior is normal. Judgment and thought content normal. Cognition and memory are normal.    Results for orders placed or performed in visit on 03/20/15  CBC with Differential/Platelet  Result Value Ref Range   WBC 5.2 3.6 - 11.0 K/uL   RBC 4.20 3.80 - 5.20  MIL/uL   Hemoglobin 12.5 12.0 - 16.0 g/dL   HCT 37.9 35.0 - 47.0 %   MCV 90.3 80.0 - 100.0 fL   MCH 29.8 26.0 - 34.0 pg   MCHC 33.0 32.0 - 36.0 g/dL   RDW 13.3 11.5 - 14.5 %   Platelets 68 (L) 150 - 440 K/uL   Neutrophils Relative % 62% %   Neutro Abs 3.3 1.4 - 6.5 K/uL   Lymphocytes Relative 26% %   Lymphs Abs 1.4 1.0 - 3.6 K/uL   Monocytes Relative 9% %   Monocytes Absolute 0.5 0.2 - 0.9 K/uL   Eosinophils Relative 2% %   Eosinophils Absolute 0.1 0 - 0.7 K/uL   Basophils Relative 1% %   Basophils Absolute 0.1 0 - 0.1 K/uL      Assessment & Plan:   Problem  List Items Addressed This Visit      Unprioritized   CKD (chronic kidney disease), stage III    Managed by Nephrology      Thrombocytopenia    Managed by Nephrology      Leg pain    Stable.  Secondary to spinal stenosis Continue present meds.  Also followed by physiology      Spinal stenosis   Hyperlipidemia   Relevant Medications   lisinopril (PRINIVIL,ZESTRIL) 10 MG tablet   Hypertension - Primary    Decrease Lisinopril to 10 mg.  Also followed by Nephrology and will follow up in September.        Relevant Medications   lisinopril (PRINIVIL,ZESTRIL) 10 MG tablet       Follow up plan: Return in about 6 months (around 12/09/2015).   Unable to get colonoscopy as has no family to go with her.  Will get stool cards Unable to get Zostavax due to cost Will get bone density next year with mammogram.

## 2015-06-08 NOTE — Assessment & Plan Note (Signed)
Managed by Nephrology.

## 2015-06-08 NOTE — Assessment & Plan Note (Signed)
Decrease Lisinopril to 10 mg.  Also followed by Nephrology and will follow up in September.

## 2015-06-08 NOTE — Assessment & Plan Note (Addendum)
Stable.  Secondary to spinal stenosis Continue present meds.  Also followed by physiology

## 2015-06-08 NOTE — Patient Instructions (Signed)
Decrease Lisinopril from 20 mg to 10 mg.

## 2015-06-09 LAB — COMPREHENSIVE METABOLIC PANEL
ALBUMIN: 4.3 g/dL (ref 3.6–4.8)
ALT: 7 IU/L (ref 0–32)
AST: 12 IU/L (ref 0–40)
Albumin/Globulin Ratio: 1.9 (ref 1.1–2.5)
Alkaline Phosphatase: 66 IU/L (ref 39–117)
BUN/Creatinine Ratio: 7 — ABNORMAL LOW (ref 11–26)
BUN: 10 mg/dL (ref 8–27)
Bilirubin Total: 0.6 mg/dL (ref 0.0–1.2)
CALCIUM: 9.5 mg/dL (ref 8.7–10.3)
CHLORIDE: 96 mmol/L — AB (ref 97–108)
CO2: 22 mmol/L (ref 18–29)
CREATININE: 1.5 mg/dL — AB (ref 0.57–1.00)
GFR calc Af Amer: 42 mL/min/{1.73_m2} — ABNORMAL LOW (ref >59)
GFR calc non Af Amer: 36 mL/min/{1.73_m2} — ABNORMAL LOW (ref >59)
GLOBULIN, TOTAL: 2.3 g/dL (ref 1.5–4.5)
GLUCOSE: 112 mg/dL — AB (ref 65–99)
Potassium: 4 mmol/L (ref 3.5–5.2)
Sodium: 136 mmol/L (ref 134–144)
Total Protein: 6.6 g/dL (ref 6.0–8.5)

## 2015-06-09 LAB — LIPID PANEL W/O CHOL/HDL RATIO
Cholesterol, Total: 164 mg/dL (ref 100–199)
HDL: 54 mg/dL (ref >39)
LDL Calculated: 95 mg/dL (ref 0–99)
TRIGLYCERIDES: 76 mg/dL (ref 0–149)
VLDL Cholesterol Cal: 15 mg/dL (ref 5–40)

## 2015-06-25 ENCOUNTER — Other Ambulatory Visit: Payer: Self-pay | Admitting: Unknown Physician Specialty

## 2015-06-25 MED ORDER — HYDROCODONE-ACETAMINOPHEN 7.5-325 MG PO TABS: 1.0000 | ORAL_TABLET | Freq: Two times a day (BID) | ORAL | Status: DC

## 2015-07-20 ENCOUNTER — Other Ambulatory Visit: Payer: Self-pay | Admitting: Unknown Physician Specialty

## 2015-07-20 MED ORDER — HYDROCODONE-ACETAMINOPHEN 7.5-325 MG PO TABS: 1.0000 | ORAL_TABLET | Freq: Two times a day (BID) | ORAL | Status: DC

## 2015-07-24 DIAGNOSIS — E872 Acidosis: Secondary | ICD-10-CM | POA: Diagnosis not present

## 2015-07-24 DIAGNOSIS — N183 Chronic kidney disease, stage 3 (moderate): Secondary | ICD-10-CM | POA: Diagnosis not present

## 2015-07-24 DIAGNOSIS — I10 Essential (primary) hypertension: Secondary | ICD-10-CM | POA: Diagnosis not present

## 2015-07-24 DIAGNOSIS — D472 Monoclonal gammopathy: Secondary | ICD-10-CM | POA: Diagnosis not present

## 2015-08-17 ENCOUNTER — Other Ambulatory Visit: Payer: Self-pay | Admitting: Unknown Physician Specialty

## 2015-08-17 MED ORDER — HYDROCODONE-ACETAMINOPHEN 7.5-325 MG PO TABS: 1.0000 | ORAL_TABLET | Freq: Two times a day (BID) | ORAL | Status: DC

## 2015-09-17 ENCOUNTER — Other Ambulatory Visit: Payer: Self-pay | Admitting: Unknown Physician Specialty

## 2015-09-17 MED ORDER — HYDROCODONE-ACETAMINOPHEN 7.5-325 MG PO TABS: 1.0000 | ORAL_TABLET | Freq: Two times a day (BID) | ORAL | Status: DC

## 2015-09-19 ENCOUNTER — Other Ambulatory Visit: Payer: Self-pay | Admitting: Oncology

## 2015-09-19 DIAGNOSIS — Z961 Presence of intraocular lens: Secondary | ICD-10-CM | POA: Diagnosis not present

## 2015-09-19 DIAGNOSIS — D696 Thrombocytopenia, unspecified: Secondary | ICD-10-CM

## 2015-09-20 ENCOUNTER — Inpatient Hospital Stay: Payer: Medicare Other

## 2015-09-20 ENCOUNTER — Inpatient Hospital Stay: Payer: Medicare Other | Admitting: Oncology

## 2015-10-02 ENCOUNTER — Other Ambulatory Visit: Payer: Self-pay | Admitting: Unknown Physician Specialty

## 2015-10-02 MED ORDER — HYDROCODONE-ACETAMINOPHEN 7.5-325 MG PO TABS: 1.0000 | ORAL_TABLET | Freq: Two times a day (BID) | ORAL | Status: DC

## 2015-10-08 ENCOUNTER — Inpatient Hospital Stay: Payer: Medicare Other

## 2015-10-08 ENCOUNTER — Inpatient Hospital Stay: Payer: Medicare Other | Attending: Oncology | Admitting: Oncology

## 2015-10-08 VITALS — BP 109/75 | HR 73 | Temp 97.2°F | Resp 16 | Wt 137.6 lb

## 2015-10-08 DIAGNOSIS — N189 Chronic kidney disease, unspecified: Secondary | ICD-10-CM | POA: Insufficient documentation

## 2015-10-08 DIAGNOSIS — D696 Thrombocytopenia, unspecified: Secondary | ICD-10-CM

## 2015-10-08 DIAGNOSIS — D472 Monoclonal gammopathy: Secondary | ICD-10-CM

## 2015-10-08 DIAGNOSIS — E785 Hyperlipidemia, unspecified: Secondary | ICD-10-CM | POA: Insufficient documentation

## 2015-10-08 DIAGNOSIS — D693 Immune thrombocytopenic purpura: Secondary | ICD-10-CM | POA: Insufficient documentation

## 2015-10-08 DIAGNOSIS — G894 Chronic pain syndrome: Secondary | ICD-10-CM | POA: Insufficient documentation

## 2015-10-08 DIAGNOSIS — F209 Schizophrenia, unspecified: Secondary | ICD-10-CM | POA: Diagnosis not present

## 2015-10-08 DIAGNOSIS — I129 Hypertensive chronic kidney disease with stage 1 through stage 4 chronic kidney disease, or unspecified chronic kidney disease: Secondary | ICD-10-CM | POA: Insufficient documentation

## 2015-10-08 DIAGNOSIS — M818 Other osteoporosis without current pathological fracture: Secondary | ICD-10-CM | POA: Diagnosis not present

## 2015-10-08 DIAGNOSIS — F17210 Nicotine dependence, cigarettes, uncomplicated: Secondary | ICD-10-CM | POA: Insufficient documentation

## 2015-10-08 DIAGNOSIS — Z79899 Other long term (current) drug therapy: Secondary | ICD-10-CM | POA: Insufficient documentation

## 2015-10-08 LAB — CBC WITH DIFFERENTIAL/PLATELET
BASOS PCT: 2 %
Basophils Absolute: 0.1 10*3/uL (ref 0–0.1)
Eosinophils Absolute: 0.1 10*3/uL (ref 0–0.7)
Eosinophils Relative: 2 %
HEMATOCRIT: 39.3 % (ref 35.0–47.0)
HEMOGLOBIN: 13.2 g/dL (ref 12.0–16.0)
LYMPHS PCT: 35 %
Lymphs Abs: 2.2 10*3/uL (ref 1.0–3.6)
MCH: 30.3 pg (ref 26.0–34.0)
MCHC: 33.5 g/dL (ref 32.0–36.0)
MCV: 90.5 fL (ref 80.0–100.0)
Monocytes Absolute: 0.5 10*3/uL (ref 0.2–0.9)
Monocytes Relative: 9 %
NEUTROS ABS: 3.3 10*3/uL (ref 1.4–6.5)
Neutrophils Relative %: 52 %
Platelets: 64 10*3/uL — ABNORMAL LOW (ref 150–440)
RBC: 4.34 MIL/uL (ref 3.80–5.20)
RDW: 14.3 % (ref 11.5–14.5)
WBC: 6.2 10*3/uL (ref 3.6–11.0)

## 2015-10-08 NOTE — Progress Notes (Signed)
Patient is having unexplained bruising.

## 2015-10-09 LAB — PROTEIN ELECTRO, RANDOM URINE
ALPHA-2-GLOBULIN, U: 0 %
Albumin ELP, Urine: 100 %
Alpha-1-Globulin, U: 0 %
BETA GLOBULIN, U: 0 %
GAMMA GLOBULIN, U: 0 %
Total Protein, Urine: <4 mg/dL

## 2015-10-13 NOTE — Progress Notes (Signed)
[facility]  [phone] [phone]) [phone]  ID: Denise Everett OB: June 14, 1951  MR#: [id number]  BUL#:[id number]  Patient Care Team: [name], NP as PCP - General (Nurse Practitioner)  CHIEF COMPLAINT:  Chief Complaint  Patient presents with  . ITP, MGUS  ITP    INTERVAL HISTORY: Patient returns to clinic today for further evaluation and laboratory work. She continues to feel well and is asymptomatic.  She admits to increased bruising, but denies any bleeding.  She has no neurologic complaints.  She has had no recent fevers or illnesses. She denies any chest pain or shortness of breath.  She has a good appetite and has maintained her weight.  She denies any nausea, vomiting, constipation, or diarrhea.  She has no melena or hematochezia.  She has no urinary complaints.  Patient offers no further specific complaints today.  REVIEW OF SYSTEMS:   Review of Systems  Constitutional: Negative.  Negative for fever and malaise/fatigue.  Respiratory: Negative.   Cardiovascular: Negative.   Musculoskeletal: Negative.   Neurological: Negative.  Negative for weakness.  Endo/Heme/Allergies: Bruises/bleeds easily.    As per HPI. Otherwise, a complete review of systems is negatve.  PAST MEDICAL HISTORY: Past Medical History  Diagnosis Date  . Hypertension   . Chronic kidney disease   . Cataract   . Schizophrenia   . Osteoporosis     hips  . Renal insufficiency   . Chronic pain syndrome   . Tobacco use disorder   . Hyperlipidemia   . Thrombocytopenia   . Allergy   . Leg pain     PAST SURGICAL HISTORY: Past Surgical History  Procedure Laterality Date  . Cataract extraction    . Ankle surgery Right     x's 2    FAMILY HISTORY Family History  Problem Relation Age of Onset  . Hyperlipidemia Mother   . Hypertension Mother        ADVANCED DIRECTIVES:    HEALTH MAINTENANCE: Social History  Substance Use Topics  . Smoking status: Current  Every Day Smoker -- 0.25 packs/day    Types: Cigarettes  . Smokeless tobacco: Current User    Types: Snuff  . Alcohol Use: No     Colonoscopy:  PAP:  Bone density:  Lipid panel:  No Known Allergies  Current Outpatient Prescriptions  Medication Sig Dispense Refill  . atorvastatin (LIPITOR) 10 MG tablet TAKE ONE (1) TABLET EACH DAY 30 tablet 6  . HYDROcodone-acetaminophen (NORCO) 7.5-325 MG tablet Take 1 tablet by mouth 2 (two) times daily. 56 tablet 0  . lisinopril (PRINIVIL,ZESTRIL) 10 MG tablet Take 1 tablet (10 mg total) by mouth daily. 90 tablet 3  . raloxifene (EVISTA) 60 MG tablet Take 60 mg by mouth daily.    . risperiDONE (RISPERDAL) 2 MG tablet TAKE ONE-HALF TO ONE TABLET AT BEDTIME 30 tablet 6  . sodium bicarbonate 650 MG tablet Take 650 mg by mouth 2 (two) times daily.    . potassium chloride SA (K-DUR,KLOR-CON) 20 MEQ tablet Take 20 mEq by mouth daily.     No current facility-administered medications for this visit.    OBJECTIVE: Filed Vitals:   10/08/15 1214  BP: 109/75  Pulse: 73  Temp: 97.2 F (36.2 C)  Resp: 16     Body mass index is 25.15 kg/(m^2).    ECOG FS:0 - Asymptomatic  General: Well-developed, well-nourished, no acute distress. Eyes: Pink conjunctiva, anicteric sclera. Lungs: Clear to auscultation bilaterally. Heart: Regular rate and rhythm. No  rubs, murmurs, or gallops. Abdomen: Soft, nontender, nondistended. No organomegaly noted, normoactive bowel sounds. Musculoskeletal: No edema, cyanosis, or clubbing. Neuro: Alert, answering all questions appropriately. Cranial nerves grossly intact. Skin: No rashes or petechiae noted. Occasional ecchymoses noted. Psych: Normal affect.   LAB RESULTS:  Lab Results  Component Value Date   NA 136 06/08/2015   K 4.0 06/08/2015   CL 96* 06/08/2015   CO2 22 06/08/2015   GLUCOSE 112* 06/08/2015   BUN 10 06/08/2015   CREATININE 1.50* 06/08/2015   CALCIUM 9.5 06/08/2015   PROT 6.6 06/08/2015   ALBUMIN  4.3 06/08/2015   AST 12 06/08/2015   ALT 7 06/08/2015   ALKPHOS 66 06/08/2015   BILITOT 0.6 06/08/2015   GFRNONAA 36* 06/08/2015   GFRAA 42* 06/08/2015    Lab Results  Component Value Date   WBC 6.2 10/08/2015   NEUTROABS 3.3 10/08/2015   HGB 13.2 10/08/2015   HCT 39.3 10/08/2015   MCV 90.5 10/08/2015   PLT 64* 10/08/2015     STUDIES: No results found.  ASSESSMENT: MGUS and ITP.  PLAN:    1.  ITP: Previously, bone marrow biopsy confirmed the results. Patient's platelet count continues to slowly trend down. Patient does not wish to have any intervention at this time, but has agreed to treatment in the near future once her insurance changes. She last received Rituxan in September 2013. Return to clinic in the first week of February for repeat laboratory work and further evaluation. Of note, patient had a response to prednisone but it was not durable.    2. MGUS:  Bone marrow biopsy revealed less than 5% plasma cells.  Continue to monitor SIEP once per year.  Patient expressed understanding and was in agreement with this plan. She also understands that She can call clinic at any time with any questions, concerns, or complaints.    Lloyd Huger, MD   10/13/2015 5:31 PM

## 2015-11-09 ENCOUNTER — Other Ambulatory Visit: Payer: Self-pay | Admitting: Unknown Physician Specialty

## 2015-11-09 MED ORDER — HYDROCODONE-ACETAMINOPHEN 7.5-325 MG PO TABS: 1.0000 | ORAL_TABLET | Freq: Two times a day (BID) | ORAL | Status: DC

## 2015-11-13 ENCOUNTER — Other Ambulatory Visit: Payer: Self-pay | Admitting: Unknown Physician Specialty

## 2015-12-12 ENCOUNTER — Ambulatory Visit: Payer: Medicare Other | Admitting: Unknown Physician Specialty

## 2015-12-14 ENCOUNTER — Other Ambulatory Visit: Payer: Self-pay

## 2015-12-14 MED ORDER — ATORVASTATIN CALCIUM 10 MG PO TABS: 10.0000 mg | ORAL_TABLET | Freq: Every day | ORAL | Status: DC

## 2015-12-14 NOTE — Telephone Encounter (Signed)
Patient has upcoming appointment 01/01/16 and pharmacy is [facility].

## 2015-12-17 ENCOUNTER — Other Ambulatory Visit: Payer: Medicare Other

## 2015-12-17 ENCOUNTER — Ambulatory Visit: Payer: Medicare Other | Admitting: Oncology

## 2015-12-25 DIAGNOSIS — N183 Chronic kidney disease, stage 3 (moderate): Secondary | ICD-10-CM | POA: Diagnosis not present

## 2015-12-25 DIAGNOSIS — E872 Acidosis: Secondary | ICD-10-CM | POA: Diagnosis not present

## 2015-12-25 DIAGNOSIS — I10 Essential (primary) hypertension: Secondary | ICD-10-CM | POA: Diagnosis not present

## 2015-12-25 DIAGNOSIS — D472 Monoclonal gammopathy: Secondary | ICD-10-CM | POA: Diagnosis not present

## 2016-01-01 ENCOUNTER — Encounter: Payer: Self-pay | Admitting: Unknown Physician Specialty

## 2016-01-01 ENCOUNTER — Ambulatory Visit (INDEPENDENT_AMBULATORY_CARE_PROVIDER_SITE_OTHER): Payer: Medicare Other | Admitting: Unknown Physician Specialty

## 2016-01-01 VITALS — BP 155/89 | HR 69 | Temp 98.0°F | Ht 61.0 in | Wt 137.8 lb

## 2016-01-01 DIAGNOSIS — I10 Essential (primary) hypertension: Secondary | ICD-10-CM | POA: Diagnosis not present

## 2016-01-01 DIAGNOSIS — M79606 Pain in leg, unspecified: Secondary | ICD-10-CM

## 2016-01-01 DIAGNOSIS — E785 Hyperlipidemia, unspecified: Secondary | ICD-10-CM | POA: Diagnosis not present

## 2016-01-01 DIAGNOSIS — Z23 Encounter for immunization: Secondary | ICD-10-CM

## 2016-01-01 MED ORDER — HYDROCODONE-ACETAMINOPHEN 7.5-325 MG PO TABS: 1.0000 | ORAL_TABLET | Freq: Two times a day (BID) | ORAL | Status: DC

## 2016-01-01 NOTE — Assessment & Plan Note (Signed)
Refilled Hydrocodone. 

## 2016-01-01 NOTE — Assessment & Plan Note (Signed)
Continue Atorvastatin

## 2016-01-01 NOTE — Assessment & Plan Note (Signed)
Followed by Nephrology who is adjusting medication

## 2016-01-01 NOTE — Progress Notes (Signed)
BP 155/89 mmHg  Pulse 69  Temp(Src) 98 F (36.7 C)  Ht 5\' 1"  (1.549 m)  Wt 137 lb 12.8 oz (62.506 kg)  BMI 26.05 kg/m2  SpO2 100%  LMP  (LMP Unknown)   Subjective:    Patient ID: Denise Everett, female    DOB: 04-26-1951, 65 y.o.   MRN: [id number]  HPI: Denise Everett is a 65 y.o. female  Chief Complaint  Patient presents with  . Pain  . Hypertension  . Hyperlipidemia  . Medication Refill    pt states she needs a refill on atorvastatin and hydrocodone   Hypertension It's been high.  Recent visit to Nephrology shows she had her BP meds increased from Lisinopril 10 mg to 20 mgs.  Note reviewed and labs were done there.   Using medications without difficulty Average home BPs Not checking   No problems or lightheadedness No chest pain with exertion or shortness of breath No Edema   Hyperlipidemia Using medications without problems No Muscle aches  Diet compliance: good Exercise: walks a lot  Leg Pain Need a refill of Hydrocodone.  Last refill 12/30.  Long standing leg pain of unknown cause.  Hydrocodone helps.  She does not drive and walks a lot   Relevant past medical, surgical, family and social history reviewed and updated as indicated. Interim medical history since our last visit reviewed. Allergies and medications reviewed and updated.  Review of Systems  Per HPI unless specifically indicated above     Objective:    BP 155/89 mmHg  Pulse 69  Temp(Src) 98 F (36.7 C)  Ht 5\' 1"  (1.549 m)  Wt 137 lb 12.8 oz (62.506 kg)  BMI 26.05 kg/m2  SpO2 100%  LMP  (LMP Unknown)  Wt Readings from Last 3 Encounters:  01/01/16 137 lb 12.8 oz (62.506 kg)  10/08/15 137 lb 9.1 oz (62.4 kg)  06/08/15 138 lb 9.6 oz (62.869 kg)    Physical Exam  Constitutional: She is oriented to person, place, and time. She appears well-developed and well-nourished. No distress.  HENT:  Head: Normocephalic and atraumatic.  Eyes: Conjunctivae and lids are normal. Right eye exhibits  no discharge. Left eye exhibits no discharge. No scleral icterus.  Neck: Normal range of motion. Neck supple. No JVD present. Carotid bruit is not present.  Cardiovascular: Normal rate, regular rhythm and normal heart sounds.   Pulmonary/Chest: Effort normal and breath sounds normal.  Abdominal: Normal appearance. There is no splenomegaly or hepatomegaly.  Musculoskeletal: Normal range of motion.  Neurological: She is alert and oriented to person, place, and time.  Skin: Skin is warm, dry and intact. No rash noted. No pallor.  Psychiatric: She has a normal mood and affect. Her behavior is normal. Judgment and thought content normal.   Labs done at Nephrology and were WNL.  Apparently a difficult stick and pt asking to not have more labs done today    Assessment & Plan:   Problem List Items Addressed This Visit      Unprioritized   Leg pain    Refilled Hydrocodone      Hyperlipidemia    Continue Atorvastatin      Relevant Medications   lisinopril (PRINIVIL,ZESTRIL) 20 MG tablet   Hypertension    Followed by Nephrology who is adjusting medication      Relevant Medications   lisinopril (PRINIVIL,ZESTRIL) 20 MG tablet    Other Visit Diagnoses    Immunization due    -  Primary  Relevant Orders    Flu Vaccine QUAD 36+ mos IM (Completed)    Need for pneumococcal vaccination        Relevant Orders    Pneumococcal polysaccharide vaccine 23-valent greater than or equal to 2yo subcutaneous/IM (Completed)        Follow up plan: Return in about 6 months (around 06/30/2016) for physical.

## 2016-01-03 ENCOUNTER — Inpatient Hospital Stay: Payer: Medicare Other

## 2016-01-03 ENCOUNTER — Inpatient Hospital Stay: Payer: Medicare Other | Admitting: Oncology

## 2016-01-15 ENCOUNTER — Inpatient Hospital Stay: Payer: Medicare Other

## 2016-01-15 ENCOUNTER — Inpatient Hospital Stay: Payer: Medicare Other | Attending: Oncology | Admitting: Oncology

## 2016-01-15 VITALS — BP 110/73 | HR 62 | Temp 97.8°F | Resp 18 | Wt 137.6 lb

## 2016-01-15 DIAGNOSIS — D472 Monoclonal gammopathy: Secondary | ICD-10-CM | POA: Diagnosis not present

## 2016-01-15 DIAGNOSIS — G894 Chronic pain syndrome: Secondary | ICD-10-CM

## 2016-01-15 DIAGNOSIS — R634 Abnormal weight loss: Secondary | ICD-10-CM

## 2016-01-15 DIAGNOSIS — I10 Essential (primary) hypertension: Secondary | ICD-10-CM | POA: Diagnosis not present

## 2016-01-15 DIAGNOSIS — N189 Chronic kidney disease, unspecified: Secondary | ICD-10-CM | POA: Insufficient documentation

## 2016-01-15 DIAGNOSIS — E785 Hyperlipidemia, unspecified: Secondary | ICD-10-CM | POA: Diagnosis not present

## 2016-01-15 DIAGNOSIS — D693 Immune thrombocytopenic purpura: Secondary | ICD-10-CM | POA: Insufficient documentation

## 2016-01-15 DIAGNOSIS — F209 Schizophrenia, unspecified: Secondary | ICD-10-CM | POA: Insufficient documentation

## 2016-01-15 DIAGNOSIS — I129 Hypertensive chronic kidney disease with stage 1 through stage 4 chronic kidney disease, or unspecified chronic kidney disease: Secondary | ICD-10-CM | POA: Insufficient documentation

## 2016-01-15 DIAGNOSIS — M818 Other osteoporosis without current pathological fracture: Secondary | ICD-10-CM | POA: Insufficient documentation

## 2016-01-15 DIAGNOSIS — F17210 Nicotine dependence, cigarettes, uncomplicated: Secondary | ICD-10-CM

## 2016-01-15 DIAGNOSIS — Z79899 Other long term (current) drug therapy: Secondary | ICD-10-CM | POA: Insufficient documentation

## 2016-01-15 DIAGNOSIS — D696 Thrombocytopenia, unspecified: Secondary | ICD-10-CM

## 2016-01-15 LAB — CBC WITH DIFFERENTIAL/PLATELET
Basophils Absolute: 0.1 10*3/uL (ref 0–0.1)
Basophils Relative: 1 %
EOS PCT: 3 %
Eosinophils Absolute: 0.2 10*3/uL (ref 0–0.7)
HCT: 34.8 % — ABNORMAL LOW (ref 35.0–47.0)
HEMOGLOBIN: 11.8 g/dL — AB (ref 12.0–16.0)
LYMPHS ABS: 2.2 10*3/uL (ref 1.0–3.6)
LYMPHS PCT: 37 %
MCH: 30.8 pg (ref 26.0–34.0)
MCHC: 33.9 g/dL (ref 32.0–36.0)
MCV: 90.6 fL (ref 80.0–100.0)
MONOS PCT: 6 %
Monocytes Absolute: 0.4 10*3/uL (ref 0.2–0.9)
NEUTROS PCT: 53 %
Neutro Abs: 3.2 10*3/uL (ref 1.4–6.5)
Platelets: 87 10*3/uL — ABNORMAL LOW (ref 150–440)
RBC: 3.84 MIL/uL (ref 3.80–5.20)
RDW: 13.5 % (ref 11.5–14.5)
WBC: 6.1 10*3/uL (ref 3.6–11.0)

## 2016-01-15 NOTE — Progress Notes (Signed)
Patient does not offer any problems today.  

## 2016-01-21 NOTE — Progress Notes (Signed)
[place]  [phone] [phone]) [phone]  ID: Denise Everett OB: 02/14/1951  MR#: [id number]  KWI#:[id number]  Patient Care Team: [name], NP as PCP - General (Nurse Practitioner)  CHIEF COMPLAINT:  Chief Complaint  Patient presents with  . thrombocytopenia    INTERVAL HISTORY: Patient returns to clinic today for further evaluation and laboratory work. She continues to feel well and is asymptomatic.  She has had some mild unintentional weight loss, denies fevers or night sweats. She does not complain of easy bleeding or bruising today.  She has no neurologic complaints. She denies any chest pain or shortness of breath.  She has a good appetite. She denies any nausea, vomiting, constipation, or diarrhea.  She has no melena or hematochezia.  She has no urinary complaints.  Patient offers no further specific complaints today.  REVIEW OF SYSTEMS:   Review of Systems  Constitutional: Positive for weight loss. Negative for fever and malaise/fatigue.  Respiratory: Negative.   Cardiovascular: Negative.   Gastrointestinal: Negative.   Musculoskeletal: Negative.   Neurological: Negative.  Negative for weakness.  Endo/Heme/Allergies: Does not bruise/bleed easily.    As per HPI. Otherwise, a complete review of systems is negatve.  PAST MEDICAL HISTORY: Past Medical History  Diagnosis Date  . Hypertension   . Chronic kidney disease   . Cataract   . Schizophrenia ([place])   . Osteoporosis     hips  . Renal insufficiency   . Chronic pain syndrome   . Tobacco use disorder   . Hyperlipidemia   . Thrombocytopenia ([place])   . Allergy   . Leg pain     PAST SURGICAL HISTORY: Past Surgical History  Procedure Laterality Date  . Cataract extraction    . Ankle surgery Right     x's 2    FAMILY HISTORY Family History  Problem Relation Age of Onset  . Hyperlipidemia Mother   . Hypertension Mother        ADVANCED DIRECTIVES:    HEALTH  MAINTENANCE: Social History  Substance Use Topics  . Smoking status: Current Every Day Smoker -- 0.25 packs/day    Types: Cigarettes  . Smokeless tobacco: Current User    Types: Snuff  . Alcohol Use: No     Colonoscopy:  PAP:  Bone density:  Lipid panel:  No Known Allergies  Current Outpatient Prescriptions  Medication Sig Dispense Refill  . atorvastatin (LIPITOR) 10 MG tablet Take 1 tablet (10 mg total) by mouth daily at 6 PM. 30 tablet 6  . HYDROcodone-acetaminophen (NORCO) 7.5-325 MG tablet Take 1 tablet by mouth 2 (two) times daily. 56 tablet 0  . lisinopril (PRINIVIL,ZESTRIL) 20 MG tablet Take 20 mg by mouth daily.    . raloxifene (EVISTA) 60 MG tablet Take 1 tablet (60 mg total) by mouth daily. 90 tablet 3  . risperiDONE (RISPERDAL) 2 MG tablet Take 0.5-1 tablets (1-2 mg total) by mouth at bedtime. 30 tablet 12   No current facility-administered medications for this visit.    OBJECTIVE: Filed Vitals:   01/15/16 1059  BP: 110/73  Pulse: 62  Temp: 97.8 F (36.6 C)  Resp: 18     Body mass index is 26.01 kg/(m^2).    ECOG FS:0 - Asymptomatic  General: Well-developed, well-nourished, no acute distress. Eyes: Pink conjunctiva, anicteric sclera. Lungs: Clear to auscultation bilaterally. Heart: Regular rate and rhythm. No rubs, murmurs, or gallops. Abdomen: Soft, nontender, nondistended. No organomegaly noted, normoactive bowel sounds. Musculoskeletal: No edema, cyanosis, or clubbing.  Neuro: Alert, answering all questions appropriately. Cranial nerves grossly intact. Skin: No rashes or petechiae noted. Occasional ecchymoses noted. Psych: Normal affect.   LAB RESULTS:  Lab Results  Component Value Date   NA 136 06/08/2015   K 4.0 06/08/2015   CL 96* 06/08/2015   CO2 22 06/08/2015   GLUCOSE 112* 06/08/2015   BUN 10 06/08/2015   CREATININE 1.50* 06/08/2015   CALCIUM 9.5 06/08/2015   PROT 6.6 06/08/2015   ALBUMIN 4.3 06/08/2015   AST 12 06/08/2015   ALT 7  06/08/2015   ALKPHOS 66 06/08/2015   BILITOT 0.6 06/08/2015   GFRNONAA 36* 06/08/2015   GFRAA 42* 06/08/2015    Lab Results  Component Value Date   WBC 6.1 01/15/2016   NEUTROABS 3.2 01/15/2016   HGB 11.8* 01/15/2016   HCT 34.8* 01/15/2016   MCV 90.6 01/15/2016   PLT 87* 01/15/2016     STUDIES: No results found.  ASSESSMENT: MGUS and ITP.  PLAN:    1.  ITP: Previously, bone marrow biopsy confirmed the results. Patient's platelet count is decreased, but stable. She does not require treatment at this time. She last received Rituxan in September 2013. Return to clinic in 3 months with repeat laboratory work and further evaluation. Of note, patient had a response to prednisone but it was not durable.    2. MGUS:  Bone marrow biopsy revealed less than 5% plasma cells.  Continue to monitor SIEP once per year. 3. Weight loss: Patient's weight has been essentially unchanged since November 2016. Monitor.  Patient expressed understanding and was in agreement with this plan. She also understands that She can call clinic at any time with any questions, concerns, or complaints.    Lloyd Huger, MD   01/21/2016 5:49 AM

## 2016-01-29 ENCOUNTER — Other Ambulatory Visit: Payer: Self-pay

## 2016-01-29 MED ORDER — HYDROCODONE-ACETAMINOPHEN 7.5-325 MG PO TABS: 1.0000 | ORAL_TABLET | Freq: Two times a day (BID) | ORAL | Status: DC

## 2016-01-29 NOTE — Telephone Encounter (Signed)
Routing to proivder. Patient was seen 01/01/16.

## 2016-01-29 NOTE — Telephone Encounter (Signed)
HYDROcodone-acetaminophen (NORCO) 7.5-325 MG tablet  Patient called needing refill on her medication. Patient wants a call when it is ready for her to come and pick it up, thanks.

## 2016-02-27 ENCOUNTER — Other Ambulatory Visit: Payer: Self-pay | Admitting: Unknown Physician Specialty

## 2016-02-27 MED ORDER — HYDROCODONE-ACETAMINOPHEN 7.5-325 MG PO TABS: 1.0000 | ORAL_TABLET | Freq: Two times a day (BID) | ORAL | Status: DC

## 2016-02-27 NOTE — Telephone Encounter (Signed)
Routing to provider. Patient was seen 01/01/16.

## 2016-02-27 NOTE — Telephone Encounter (Signed)
Called and let patient know rx was ready to be picked up.

## 2016-02-27 NOTE — Telephone Encounter (Signed)
Pt would like refill on HYDROcodone-acetaminophen (NORCO) 7.5-325 MG tablet would like a call back when ready

## 2016-03-26 ENCOUNTER — Other Ambulatory Visit: Payer: Self-pay | Admitting: Unknown Physician Specialty

## 2016-03-26 MED ORDER — HYDROCODONE-ACETAMINOPHEN 7.5-325 MG PO TABS: 1.0000 | ORAL_TABLET | Freq: Two times a day (BID) | ORAL | Status: DC

## 2016-03-26 NOTE — Telephone Encounter (Signed)
Routing to provider. Patient was last seen 01/01/16 and has f/u scheduled in August.

## 2016-03-26 NOTE — Telephone Encounter (Signed)
Pt requesting hydrocodone, would like someone to call when ready.

## 2016-04-15 ENCOUNTER — Inpatient Hospital Stay: Payer: Medicare Other

## 2016-04-15 ENCOUNTER — Inpatient Hospital Stay: Payer: Medicare Other | Admitting: Oncology

## 2016-04-25 ENCOUNTER — Telehealth: Payer: Self-pay | Admitting: Unknown Physician Specialty

## 2016-04-25 ENCOUNTER — Other Ambulatory Visit: Payer: Self-pay | Admitting: Unknown Physician Specialty

## 2016-04-25 MED ORDER — HYDROCODONE-ACETAMINOPHEN 7.5-325 MG PO TABS: 1.0000 | ORAL_TABLET | Freq: Two times a day (BID) | ORAL | Status: DC

## 2016-04-25 NOTE — Telephone Encounter (Signed)
Pt called stated she needs a refill on Hydrocodone. Please call pt when RX is ready for pick up. Thanks.

## 2016-04-25 NOTE — Telephone Encounter (Signed)
Done

## 2016-04-25 NOTE — Telephone Encounter (Signed)
Called and let patient know that rx was ready for pick up.

## 2016-04-25 NOTE — Telephone Encounter (Signed)
Routing to provider  

## 2016-04-28 ENCOUNTER — Inpatient Hospital Stay: Payer: Medicare Other | Attending: Oncology

## 2016-04-28 ENCOUNTER — Inpatient Hospital Stay: Payer: Medicare Other | Admitting: Oncology

## 2016-04-28 DIAGNOSIS — D693 Immune thrombocytopenic purpura: Secondary | ICD-10-CM | POA: Diagnosis not present

## 2016-04-28 LAB — CBC WITH DIFFERENTIAL/PLATELET
Basophils Absolute: 0.1 K/uL (ref 0–0.1)
Basophils Relative: 1 %
Eosinophils Absolute: 0.1 K/uL (ref 0–0.7)
Eosinophils Relative: 2 %
HCT: 35.6 % (ref 35.0–47.0)
Hemoglobin: 12 g/dL (ref 12.0–16.0)
Lymphocytes Relative: 28 %
Lymphs Abs: 1.8 K/uL (ref 1.0–3.6)
MCH: 31 pg (ref 26.0–34.0)
MCHC: 33.8 g/dL (ref 32.0–36.0)
MCV: 91.8 fL (ref 80.0–100.0)
Monocytes Absolute: 0.5 K/uL (ref 0.2–0.9)
Monocytes Relative: 8 %
Neutro Abs: 4 K/uL (ref 1.4–6.5)
Neutrophils Relative %: 61 %
Platelets: 62 K/uL — ABNORMAL LOW (ref 150–440)
RBC: 3.87 MIL/uL (ref 3.80–5.20)
RDW: 14.1 % (ref 11.5–14.5)
WBC: 6.5 K/uL (ref 3.6–11.0)

## 2016-05-07 ENCOUNTER — Inpatient Hospital Stay: Payer: Medicare Other | Admitting: Oncology

## 2016-05-14 ENCOUNTER — Telehealth: Payer: Self-pay

## 2016-05-14 MED ORDER — ATORVASTATIN CALCIUM 10 MG PO TABS: 10.0000 mg | ORAL_TABLET | Freq: Every day | ORAL | Status: DC

## 2016-05-14 NOTE — Telephone Encounter (Signed)
Pharmacy requesting refill for Atorvastatin and they are requesting a 90 day supply. Pharmacy is [facility].

## 2016-05-19 ENCOUNTER — Other Ambulatory Visit: Payer: Self-pay | Admitting: Unknown Physician Specialty

## 2016-05-19 MED ORDER — HYDROCODONE-ACETAMINOPHEN 7.5-325 MG PO TABS: 1.0000 | ORAL_TABLET | Freq: Two times a day (BID) | ORAL | Status: DC

## 2016-05-19 NOTE — Telephone Encounter (Signed)
Please let pt know this is too early.  Ask Denise Everett if we can put her back on the 28 days schedule.  She can have it 28 days after the last rx which I believe is the 13th.  Please hold till then.

## 2016-05-19 NOTE — Telephone Encounter (Signed)
Pt needs refill on HYDROcodone-acetaminophen (NORCO) 7.5-325 MG tablet

## 2016-05-19 NOTE — Telephone Encounter (Signed)
Routing to provider  

## 2016-05-19 NOTE — Telephone Encounter (Signed)
Patient notified

## 2016-05-22 ENCOUNTER — Inpatient Hospital Stay: Payer: Medicare Other | Admitting: Oncology

## 2016-05-30 ENCOUNTER — Telehealth: Payer: Self-pay | Admitting: Unknown Physician Specialty

## 2016-05-30 NOTE — Telephone Encounter (Signed)
Routing to provider for advice.

## 2016-05-30 NOTE — Telephone Encounter (Signed)
Pt called and stated that her nurse from [employer] had came out and told her that her BP was too low. After the nurse left she had an appointment with her nephrologist and he told her that she needed to go back to taking her BP medication that was 10mg  instead of 20mg . Pt would like to know if she needs to come in the office first or if she will be ok to just take the 10mg  until her appointment on 07/02/16.

## 2016-05-30 NOTE — Telephone Encounter (Signed)
10 mg will be fine.  Thanks

## 2016-05-30 NOTE — Telephone Encounter (Signed)
Called and let patient know that [name] said OK to take 10 mg until next appt.

## 2016-06-13 ENCOUNTER — Other Ambulatory Visit: Payer: Self-pay | Admitting: Unknown Physician Specialty

## 2016-06-13 MED ORDER — HYDROCODONE-ACETAMINOPHEN 7.5-325 MG PO TABS: 1.0000 | ORAL_TABLET | Freq: Two times a day (BID) | ORAL | 0 refills | Status: DC

## 2016-06-22 NOTE — Progress Notes (Deleted)
[place]  [phone]) [phone] [phone]) [phone]  ID: Denise Everett OB: 07/14/1951  MR#: [id number]  YKD#:[id number]  Patient Care Team: [name], NP as PCP - General (Nurse Practitioner)  CHIEF COMPLAINT: MGUS and ITP  INTERVAL HISTORY: Patient returns to clinic today for further evaluation and laboratory work. She continues to feel well and is asymptomatic.  She has had some mild unintentional weight loss, denies fevers or night sweats. She does not complain of easy bleeding or bruising today.  She has no neurologic complaints. She denies any chest pain or shortness of breath.  She has a good appetite. She denies any nausea, vomiting, constipation, or diarrhea.  She has no melena or hematochezia.  She has no urinary complaints.  Patient offers no further specific complaints today.  REVIEW OF SYSTEMS:   Review of Systems  Constitutional: Positive for weight loss. Negative for fever and malaise/fatigue.  Respiratory: Negative.   Cardiovascular: Negative.   Gastrointestinal: Negative.   Musculoskeletal: Negative.   Neurological: Negative.  Negative for weakness.  Endo/Heme/Allergies: Does not bruise/bleed easily.    As per HPI. Otherwise, a complete review of systems is negatve.  PAST MEDICAL HISTORY: Past Medical History:  Diagnosis Date  . Allergy   . Cataract   . Chronic kidney disease   . Chronic pain syndrome   . Hyperlipidemia   . Hypertension   . Leg pain   . Osteoporosis    hips  . Renal insufficiency   . Schizophrenia ([place])   . Thrombocytopenia ([place])   . Tobacco use disorder     PAST SURGICAL HISTORY: Past Surgical History:  Procedure Laterality Date  . ankle surgery Right    x's 2  . CATARACT EXTRACTION      FAMILY HISTORY Family History  Problem Relation Age of Onset  . Hyperlipidemia Mother   . Hypertension Mother        ADVANCED DIRECTIVES:    HEALTH MAINTENANCE: Social History  Substance Use Topics  .  Smoking status: Current Every Day Smoker    Packs/day: 0.25    Types: Cigarettes  . Smokeless tobacco: Current User    Types: Snuff  . Alcohol use No     Colonoscopy:  PAP:  Bone density:  Lipid panel:  No Known Allergies  Current Outpatient Prescriptions  Medication Sig Dispense Refill  . atorvastatin (LIPITOR) 10 MG tablet Take 1 tablet (10 mg total) by mouth daily at 6 PM. 90 tablet 3  . HYDROcodone-acetaminophen (NORCO) 7.5-325 MG tablet Take 1 tablet by mouth 2 (two) times daily. 56 tablet 0  . lisinopril (PRINIVIL,ZESTRIL) 20 MG tablet Take 20 mg by mouth daily.    . raloxifene (EVISTA) 60 MG tablet Take 1 tablet (60 mg total) by mouth daily. 90 tablet 3  . risperiDONE (RISPERDAL) 2 MG tablet Take 0.5-1 tablets (1-2 mg total) by mouth at bedtime. 30 tablet 12   No current facility-administered medications for this visit.     OBJECTIVE: There were no vitals filed for this visit.   There is no height or weight on file to calculate BMI.    ECOG FS:0 - Asymptomatic  General: Well-developed, well-nourished, no acute distress. Eyes: Pink conjunctiva, anicteric sclera. Lungs: Clear to auscultation bilaterally. Heart: Regular rate and rhythm. No rubs, murmurs, or gallops. Abdomen: Soft, nontender, nondistended. No organomegaly noted, normoactive bowel sounds. Musculoskeletal: No edema, cyanosis, or clubbing. Neuro: Alert, answering all questions appropriately. Cranial nerves grossly intact. Skin: No rashes or petechiae noted. Occasional ecchymoses  noted. Psych: Normal affect.   LAB RESULTS:  Lab Results  Component Value Date   NA 136 06/08/2015   K 4.0 06/08/2015   CL 96 (L) 06/08/2015   CO2 22 06/08/2015   GLUCOSE 112 (H) 06/08/2015   BUN 10 06/08/2015   CREATININE 1.50 (H) 06/08/2015   CALCIUM 9.5 06/08/2015   PROT 6.6 06/08/2015   ALBUMIN 4.3 06/08/2015   AST 12 06/08/2015   ALT 7 06/08/2015   ALKPHOS 66 06/08/2015   BILITOT 0.6 06/08/2015   GFRNONAA 36 (L)  06/08/2015   GFRAA 42 (L) 06/08/2015    Lab Results  Component Value Date   WBC 6.5 04/28/2016   NEUTROABS 4.0 04/28/2016   HGB 12.0 04/28/2016   HCT 35.6 04/28/2016   MCV 91.8 04/28/2016   PLT 62 (L) 04/28/2016     STUDIES: No results found.  ASSESSMENT: MGUS and ITP.  PLAN:    1.  ITP: Previously, bone marrow biopsy confirmed the results. Patient's platelet count is decreased, but stable. She does not require treatment at this time. She last received Rituxan in September 2013. Return to clinic in 3 months with repeat laboratory work and further evaluation. Of note, patient had a response to prednisone but it was not durable.    2. MGUS:  Bone marrow biopsy revealed less than 5% plasma cells.  Continue to monitor SIEP once per year. 3. Weight loss: Patient's weight has been essentially unchanged since November 2016. Monitor.  Patient expressed understanding and was in agreement with this plan. She also understands that She can call clinic at any time with any questions, concerns, or complaints.    Lloyd Huger, MD   06/22/2016 11:56 PM

## 2016-06-23 ENCOUNTER — Inpatient Hospital Stay: Payer: Medicare Other | Admitting: Oncology

## 2016-06-23 DIAGNOSIS — D472 Monoclonal gammopathy: Secondary | ICD-10-CM | POA: Insufficient documentation

## 2016-06-27 ENCOUNTER — Other Ambulatory Visit: Payer: Self-pay | Admitting: Unknown Physician Specialty

## 2016-06-27 MED ORDER — HYDROCODONE-ACETAMINOPHEN 7.5-325 MG PO TABS: 1.0000 | ORAL_TABLET | Freq: Two times a day (BID) | ORAL | 0 refills | Status: DC

## 2016-07-02 ENCOUNTER — Ambulatory Visit: Payer: Medicare Other | Admitting: Unknown Physician Specialty

## 2016-07-07 ENCOUNTER — Encounter: Payer: Self-pay | Admitting: Unknown Physician Specialty

## 2016-07-07 ENCOUNTER — Ambulatory Visit (INDEPENDENT_AMBULATORY_CARE_PROVIDER_SITE_OTHER): Payer: Medicare Other | Admitting: Unknown Physician Specialty

## 2016-07-07 VITALS — BP 106/71 | HR 66 | Temp 97.5°F | Ht 61.6 in | Wt 139.8 lb

## 2016-07-07 DIAGNOSIS — I10 Essential (primary) hypertension: Secondary | ICD-10-CM | POA: Diagnosis not present

## 2016-07-07 DIAGNOSIS — N183 Chronic kidney disease, stage 3 unspecified: Secondary | ICD-10-CM

## 2016-07-07 DIAGNOSIS — M79606 Pain in leg, unspecified: Secondary | ICD-10-CM

## 2016-07-07 DIAGNOSIS — E785 Hyperlipidemia, unspecified: Secondary | ICD-10-CM

## 2016-07-07 MED ORDER — ATORVASTATIN CALCIUM 10 MG PO TABS: 10.0000 mg | ORAL_TABLET | Freq: Every day | ORAL | 3 refills | Status: DC

## 2016-07-07 MED ORDER — RISPERIDONE 2 MG PO TABS: 1.0000 mg | ORAL_TABLET | Freq: Every day | ORAL | 3 refills | Status: DC

## 2016-07-07 MED ORDER — RALOXIFENE HCL 60 MG PO TABS: 60.0000 mg | ORAL_TABLET | Freq: Every day | ORAL | 3 refills | Status: DC

## 2016-07-07 MED ORDER — LISINOPRIL 10 MG PO TABS: 10.0000 mg | ORAL_TABLET | Freq: Every day | ORAL | 1 refills | Status: DC

## 2016-07-07 NOTE — Progress Notes (Signed)
BP 106/71 (BP Location: Right Arm, Patient Position: Sitting, Cuff Size: Large)   Pulse 66   Temp 97.5 F (36.4 C)   Ht 5' 1.6" (1.565 m)   Wt 139 lb 12.8 oz (63.4 kg)   LMP  (LMP Unknown)   SpO2 100%   BMI 25.90 kg/m    Subjective:    Patient ID: Denise Everett, female    DOB: 10-28-1951, 65 y.o.   MRN: [id number]  HPI: Denise Everett is a 65 y.o. female  Chief Complaint  Patient presents with  . Hyperlipidemia  . Hypertension  . Pain   Hypertension Using medications without difficulty Average home BPs "since you put me back on the 10 mg my BP is OK"  No problems or lightheadedness No chest pain with exertion or shortness of breath No Edema   Hyperlipidemia Using medications without problems No Muscle aches  Diet compliance: watches what she eats Exercise: regular exercise  Leg pain Takes pain meds twice a day for her leg pain.  States it wears off after 6 hours.  Pt states she walks faster with her pain meds and allows her to do more.  She does not drive  She wants her brother Fritz Pickerel to make medical decisions for her.    Relevant past medical, surgical, family and social history reviewed and updated as indicated. Interim medical history since our last visit reviewed. Allergies and medications reviewed and updated.  Review of Systems  Per HPI unless specifically indicated above     Objective:    BP 106/71 (BP Location: Right Arm, Patient Position: Sitting, Cuff Size: Large)   Pulse 66   Temp 97.5 F (36.4 C)   Ht 5' 1.6" (1.565 m)   Wt 139 lb 12.8 oz (63.4 kg)   LMP  (LMP Unknown)   SpO2 100%   BMI 25.90 kg/m   Wt Readings from Last 3 Encounters:  07/07/16 139 lb 12.8 oz (63.4 kg)  01/15/16 137 lb 9.1 oz (62.4 kg)  01/01/16 137 lb 12.8 oz (62.5 kg)    Physical Exam  Constitutional: She is oriented to person, place, and time. She appears well-developed and well-nourished. No distress.  HENT:  Head: Normocephalic and atraumatic.  Eyes:  Conjunctivae and lids are normal. Right eye exhibits no discharge. Left eye exhibits no discharge. No scleral icterus.  Neck: Normal range of motion. Neck supple. No JVD present. Carotid bruit is not present.  Cardiovascular: Normal rate, regular rhythm and normal heart sounds.   Pulmonary/Chest: Effort normal and breath sounds normal.  Abdominal: Normal appearance. There is no splenomegaly or hepatomegaly.  Musculoskeletal: Normal range of motion.  Neurological: She is alert and oriented to person, place, and time.  Skin: Skin is warm, dry and intact. No rash noted. No pallor.  Psychiatric: She has a normal mood and affect. Her behavior is normal. Judgment and thought content normal.    Results for orders placed or performed in visit on 04/28/16  CBC with Differential/Platelet  Result Value Ref Range   WBC 6.5 3.6 - 11.0 K/uL   RBC 3.87 3.80 - 5.20 MIL/uL   Hemoglobin 12.0 12.0 - 16.0 g/dL   HCT 35.6 35.0 - 47.0 %   MCV 91.8 80.0 - 100.0 fL   MCH 31.0 26.0 - 34.0 pg   MCHC 33.8 32.0 - 36.0 g/dL   RDW 14.1 11.5 - 14.5 %   Platelets 62 (L) 150 - 440 K/uL   Neutrophils Relative % 61 %  Neutro Abs 4.0 1.4 - 6.5 K/uL   Lymphocytes Relative 28 %   Lymphs Abs 1.8 1.0 - 3.6 K/uL   Monocytes Relative 8 %   Monocytes Absolute 0.5 0.2 - 0.9 K/uL   Eosinophils Relative 2 %   Eosinophils Absolute 0.1 0 - 0.7 K/uL   Basophils Relative 1 %   Basophils Absolute 0.1 0 - 0.1 K/uL      Assessment & Plan:   Problem List Items Addressed This Visit      Unprioritized   CKD (chronic kidney disease), stage III - Primary   Relevant Orders   Comprehensive metabolic panel   Hyperlipidemia   Relevant Medications   atorvastatin (LIPITOR) 10 MG tablet   lisinopril (PRINIVIL,ZESTRIL) 10 MG tablet   Other Relevant Orders   Lipid Panel w/o Chol/HDL Ratio   Hypertension   Relevant Medications   atorvastatin (LIPITOR) 10 MG tablet   lisinopril (PRINIVIL,ZESTRIL) 10 MG tablet   Other Relevant  Orders   Comprehensive metabolic panel   Leg pain    Other Visit Diagnoses   None.      Follow up plan: Return in about 3 months (around 10/07/2016) for Will do drug screen then.

## 2016-07-08 ENCOUNTER — Encounter: Payer: Self-pay | Admitting: Unknown Physician Specialty

## 2016-07-08 ENCOUNTER — Telehealth: Payer: Self-pay | Admitting: Unknown Physician Specialty

## 2016-07-08 LAB — COMPREHENSIVE METABOLIC PANEL
A/G RATIO: 1.6 (ref 1.2–2.2)
ALT: 5 IU/L (ref 0–32)
AST: 9 IU/L (ref 0–40)
Albumin: 3.6 g/dL (ref 3.6–4.8)
Alkaline Phosphatase: 53 IU/L (ref 39–117)
BILIRUBIN TOTAL: 0.2 mg/dL (ref 0.0–1.2)
BUN/Creatinine Ratio: 7 — ABNORMAL LOW (ref 12–28)
BUN: 11 mg/dL (ref 8–27)
CHLORIDE: 95 mmol/L — AB (ref 96–106)
CO2: 17 mmol/L — ABNORMAL LOW (ref 18–29)
Calcium: 8.2 mg/dL — ABNORMAL LOW (ref 8.7–10.3)
Creatinine, Ser: 1.56 mg/dL — ABNORMAL HIGH (ref 0.57–1.00)
GFR, EST AFRICAN AMERICAN: 39 mL/min/{1.73_m2} — AB (ref >59)
GFR, EST NON AFRICAN AMERICAN: 34 mL/min/{1.73_m2} — AB (ref >59)
GLOBULIN, TOTAL: 2.2 g/dL (ref 1.5–4.5)
Glucose: 90 mg/dL (ref 65–99)
POTASSIUM: 4.5 mmol/L (ref 3.5–5.2)
SODIUM: 127 mmol/L — AB (ref 134–144)
Total Protein: 5.8 g/dL — ABNORMAL LOW (ref 6.0–8.5)

## 2016-07-08 LAB — LIPID PANEL W/O CHOL/HDL RATIO
Cholesterol, Total: 140 mg/dL (ref 100–199)
HDL: 47 mg/dL (ref >39)
LDL Calculated: 82 mg/dL (ref 0–99)
TRIGLYCERIDES: 55 mg/dL (ref 0–149)
VLDL Cholesterol Cal: 11 mg/dL (ref 5–40)

## 2016-07-08 NOTE — Progress Notes (Signed)
   LMP  (LMP Unknown)    Subjective:    Patient ID: Denise Everett, female    DOB: 1951-06-14, 65 y.o.   MRN: [id number]  HPI: Denise Everett is a 65 y.o. female  No chief complaint on file.   Relevant past medical, surgical, family and social history reviewed and updated as indicated. Interim medical history since our last visit reviewed. Allergies and medications reviewed and updated.  Review of Systems  Per HPI unless specifically indicated above     Objective:    LMP  (LMP Unknown)   Wt Readings from Last 3 Encounters:  07/07/16 139 lb 12.8 oz (63.4 kg)  01/15/16 137 lb 9.1 oz (62.4 kg)  01/01/16 137 lb 12.8 oz (62.5 kg)    Physical Exam  Results for orders placed or performed in visit on 07/07/16  Comprehensive metabolic panel  Result Value Ref Range   Glucose 90 65 - 99 mg/dL   BUN 11 8 - 27 mg/dL   Creatinine, Ser 1.56 (H) 0.57 - 1.00 mg/dL   GFR calc non Af Amer 34 (L) >59 mL/min/1.73   GFR calc Af Amer 39 (L) >59 mL/min/1.73   BUN/Creatinine Ratio 7 (L) 12 - 28   Sodium 127 (L) 134 - 144 mmol/L   Potassium 4.5 3.5 - 5.2 mmol/L   Chloride 95 (L) 96 - 106 mmol/L   CO2 17 (L) 18 - 29 mmol/L   Calcium 8.2 (L) 8.7 - 10.3 mg/dL   Total Protein 5.8 (L) 6.0 - 8.5 g/dL   Albumin 3.6 3.6 - 4.8 g/dL   Globulin, Total 2.2 1.5 - 4.5 g/dL   Albumin/Globulin Ratio 1.6 1.2 - 2.2   Bilirubin Total 0.2 0.0 - 1.2 mg/dL   Alkaline Phosphatase 53 39 - 117 IU/L   AST 9 0 - 40 IU/L   ALT 5 0 - 32 IU/L  Lipid Panel w/o Chol/HDL Ratio  Result Value Ref Range   Cholesterol, Total 140 100 - 199 mg/dL   Triglycerides 55 0 - 149 mg/dL   HDL 47 >39 mg/dL   VLDL Cholesterol Cal 11 5 - 40 mg/dL   LDL Calculated 82 0 - 99 mg/dL      Assessment & Plan:   Problem List Items Addressed This Visit    None    Visit Diagnoses   None.      Follow up plan: No Follow-up on file.

## 2016-07-08 NOTE — Telephone Encounter (Signed)
Discussed with pt low GFR.  She was lost to f/u with Nephrology.  Pt states she will make another appointment.

## 2016-07-15 NOTE — Progress Notes (Signed)
[place]  [phone] [phone]) [phone]  ID: [name] OB: 1950-11-26  MR#: [id number]  HWT#:[id number]  Patient Care Team: [name], NP as PCP - General (Nurse Practitioner)  CHIEF COMPLAINT: MGUS and ITP  INTERVAL HISTORY: Patient returns to clinic today for further evaluation and laboratory work. She continues to feel well and is asymptomatic.  She denies fevers or night sweats. She has no further weight loss. She does not complain of easy bleeding or bruising today.  She has no neurologic complaints. She denies any chest pain or shortness of breath.  She has a good appetite. She denies any nausea, vomiting, constipation, or diarrhea.  She has no melena or hematochezia.  She has no urinary complaints.  Patient offers no further specific complaints today.  REVIEW OF SYSTEMS:   Review of Systems  Constitutional: Negative for fever, malaise/fatigue and weight loss.  Respiratory: Negative.  Negative for cough and shortness of breath.   Cardiovascular: Negative.  Negative for chest pain.  Gastrointestinal: Negative.  Negative for abdominal pain.  Musculoskeletal: Negative.   Neurological: Negative.  Negative for weakness.  Endo/Heme/Allergies: Does not bruise/bleed easily.  Psychiatric/Behavioral: The patient is not nervous/anxious.     As per HPI. Otherwise, a complete review of systems is negatve.  PAST MEDICAL HISTORY: Past Medical History:  Diagnosis Date  . Allergy   . Cataract   . Chronic kidney disease   . Chronic pain syndrome   . Hyperlipidemia   . Hypertension   . Leg pain   . Osteoporosis    hips  . Renal insufficiency   . Schizophrenia ([place])   . Thrombocytopenia ([place])   . Tobacco use disorder     PAST SURGICAL HISTORY: Past Surgical History:  Procedure Laterality Date  . ankle surgery Right    x's 2  . CATARACT EXTRACTION      FAMILY HISTORY Family History  Problem Relation Age of Onset  . Hyperlipidemia  Mother   . Hypertension Mother        ADVANCED DIRECTIVES:    HEALTH MAINTENANCE: Social History  Substance Use Topics  . Smoking status: Current Every Day Smoker    Packs/day: 0.25    Types: Cigarettes  . Smokeless tobacco: Current User    Types: Snuff  . Alcohol use No     Colonoscopy:  PAP:  Bone density:  Lipid panel:  No Known Allergies  Current Outpatient Prescriptions  Medication Sig Dispense Refill  . atorvastatin (LIPITOR) 10 MG tablet Take 1 tablet (10 mg total) by mouth daily at 6 PM. 90 tablet 3  . HYDROcodone-acetaminophen (NORCO) 7.5-325 MG tablet Take 1 tablet by mouth 2 (two) times daily. 56 tablet 0  . lisinopril (PRINIVIL,ZESTRIL) 10 MG tablet Take 1 tablet (10 mg total) by mouth daily. 90 tablet 1  . raloxifene (EVISTA) 60 MG tablet Take 1 tablet (60 mg total) by mouth daily. 90 tablet 3  . risperiDONE (RISPERDAL) 2 MG tablet Take 0.5-1 tablets (1-2 mg total) by mouth at bedtime. 90 tablet 3   No current facility-administered medications for this visit.     OBJECTIVE: Vitals:   07/16/16 1009  BP: (!) 157/82  Pulse: 67  Resp: 18  Temp: (!) 95 F (35 C)     Body mass index is 25.33 kg/m.    ECOG FS:0 - Asymptomatic  General: Well-developed, well-nourished, no acute distress. Eyes: Pink conjunctiva, anicteric sclera. Lungs: Clear to auscultation bilaterally. Heart: Regular rate and rhythm. No rubs, murmurs,  or gallops. Abdomen: Soft, nontender, nondistended. No organomegaly noted, normoactive bowel sounds. Musculoskeletal: No edema, cyanosis, or clubbing. Neuro: Alert, answering all questions appropriately. Cranial nerves grossly intact. Skin: No rashes or petechiae noted. Occasional ecchymoses noted. Psych: Normal affect.   LAB RESULTS:  Lab Results  Component Value Date   NA 127 (L) 07/07/2016   K 4.5 07/07/2016   CL 95 (L) 07/07/2016   CO2 17 (L) 07/07/2016   GLUCOSE 90 07/07/2016   BUN 11 07/07/2016   CREATININE 1.56 (H)  07/07/2016   CALCIUM 8.2 (L) 07/07/2016   PROT 5.8 (L) 07/07/2016   ALBUMIN 3.6 07/07/2016   AST 9 07/07/2016   ALT 5 07/07/2016   ALKPHOS 53 07/07/2016   BILITOT 0.2 07/07/2016   GFRNONAA 34 (L) 07/07/2016   GFRAA 39 (L) 07/07/2016    Lab Results  Component Value Date   WBC 5.0 07/16/2016   NEUTROABS 3.1 07/16/2016   HGB 12.4 07/16/2016   HCT 35.9 07/16/2016   MCV 90.0 07/16/2016   PLT 53 (L) 07/16/2016     STUDIES: No results found.  ASSESSMENT: MGUS and ITP.  PLAN:    1.  ITP: Previously, bone marrow biopsy confirmed the results. Patient's platelet count is decreased, but stable. She does not require treatment at this time. She last received Rituxan in September 2013. Return to clinic in 3 months with repeat laboratory work and then in 6 months for laboratory work and further evaluation. Of note, patient had a response to prednisone but it was not durable.    2. MGUS:  Bone marrow biopsy revealed less than 5% plasma cells.  Continue to monitor SIEP once per year. 3. Weight loss: Patient's weight has been essentially unchanged since November 2016. Monitor.  Patient expressed understanding and was in agreement with this plan. She also understands that She can call clinic at any time with any questions, concerns, or complaints.    Lloyd Huger, MD   07/16/2016 11:50 AM

## 2016-07-16 ENCOUNTER — Inpatient Hospital Stay: Payer: Medicare Other | Attending: Oncology | Admitting: Oncology

## 2016-07-16 ENCOUNTER — Other Ambulatory Visit: Payer: Self-pay

## 2016-07-16 ENCOUNTER — Inpatient Hospital Stay: Payer: Medicare Other

## 2016-07-16 VITALS — BP 157/82 | HR 67 | Temp 95.0°F | Resp 18 | Wt 136.7 lb

## 2016-07-16 DIAGNOSIS — D693 Immune thrombocytopenic purpura: Secondary | ICD-10-CM | POA: Insufficient documentation

## 2016-07-16 DIAGNOSIS — Z79899 Other long term (current) drug therapy: Secondary | ICD-10-CM

## 2016-07-16 DIAGNOSIS — M79606 Pain in leg, unspecified: Secondary | ICD-10-CM | POA: Insufficient documentation

## 2016-07-16 DIAGNOSIS — N189 Chronic kidney disease, unspecified: Secondary | ICD-10-CM | POA: Diagnosis not present

## 2016-07-16 DIAGNOSIS — M818 Other osteoporosis without current pathological fracture: Secondary | ICD-10-CM | POA: Diagnosis not present

## 2016-07-16 DIAGNOSIS — I129 Hypertensive chronic kidney disease with stage 1 through stage 4 chronic kidney disease, or unspecified chronic kidney disease: Secondary | ICD-10-CM

## 2016-07-16 DIAGNOSIS — G894 Chronic pain syndrome: Secondary | ICD-10-CM

## 2016-07-16 DIAGNOSIS — E785 Hyperlipidemia, unspecified: Secondary | ICD-10-CM | POA: Diagnosis not present

## 2016-07-16 DIAGNOSIS — F209 Schizophrenia, unspecified: Secondary | ICD-10-CM | POA: Diagnosis not present

## 2016-07-16 DIAGNOSIS — D472 Monoclonal gammopathy: Secondary | ICD-10-CM | POA: Diagnosis not present

## 2016-07-16 DIAGNOSIS — F17210 Nicotine dependence, cigarettes, uncomplicated: Secondary | ICD-10-CM | POA: Diagnosis not present

## 2016-07-16 LAB — CBC WITH DIFFERENTIAL/PLATELET
Basophils Absolute: 0.1 10*3/uL (ref 0–0.1)
Basophils Relative: 1 %
Eosinophils Absolute: 0.1 10*3/uL (ref 0–0.7)
Eosinophils Relative: 2 %
HEMATOCRIT: 35.9 % (ref 35.0–47.0)
HEMOGLOBIN: 12.4 g/dL (ref 12.0–16.0)
LYMPHS ABS: 1.4 10*3/uL (ref 1.0–3.6)
MCH: 31 pg (ref 26.0–34.0)
MCHC: 34.4 g/dL (ref 32.0–36.0)
MCV: 90 fL (ref 80.0–100.0)
Monocytes Absolute: 0.4 10*3/uL (ref 0.2–0.9)
NEUTROS ABS: 3.1 10*3/uL (ref 1.4–6.5)
Platelets: 53 10*3/uL — ABNORMAL LOW (ref 150–440)
RBC: 3.99 MIL/uL (ref 3.80–5.20)
RDW: 13.8 % (ref 11.5–14.5)
WBC: 5 10*3/uL (ref 3.6–11.0)

## 2016-07-16 NOTE — Progress Notes (Signed)
Offers no complaints. Feeling well. 

## 2016-07-17 ENCOUNTER — Telehealth: Payer: Self-pay | Admitting: Unknown Physician Specialty

## 2016-07-17 NOTE — Telephone Encounter (Signed)
She can wait until her appointment or come in and see one of [place] prior to Cheryl's return if she is really worried.

## 2016-07-17 NOTE — Telephone Encounter (Signed)
Called and let patient know what Dr. Wynetta Emery said. Patient stated that her granddaughter was going to keep a check on her BP and stated that she would call us if she needed or wanted to come in sooner.

## 2016-07-17 NOTE — Telephone Encounter (Signed)
Called and spoke to patient. She stated that she went to oncology yesterday and her BP was high. Patient has an appointment scheduled for 08/08/16 but wants to know if she needs to change medications or dosages until then.

## 2016-08-08 ENCOUNTER — Ambulatory Visit (INDEPENDENT_AMBULATORY_CARE_PROVIDER_SITE_OTHER): Payer: Medicare Other | Admitting: Unknown Physician Specialty

## 2016-08-08 ENCOUNTER — Telehealth: Payer: Self-pay | Admitting: Unknown Physician Specialty

## 2016-08-08 ENCOUNTER — Encounter: Payer: Self-pay | Admitting: Unknown Physician Specialty

## 2016-08-08 VITALS — BP 114/74 | HR 64 | Temp 97.8°F | Ht 61.5 in | Wt 133.8 lb

## 2016-08-08 DIAGNOSIS — M545 Low back pain: Principal | ICD-10-CM

## 2016-08-08 DIAGNOSIS — Z23 Encounter for immunization: Secondary | ICD-10-CM | POA: Diagnosis not present

## 2016-08-08 DIAGNOSIS — I10 Essential (primary) hypertension: Secondary | ICD-10-CM

## 2016-08-08 DIAGNOSIS — G8929 Other chronic pain: Secondary | ICD-10-CM

## 2016-08-08 MED ORDER — HYDROCODONE-ACETAMINOPHEN 7.5-325 MG PO TABS: 1.0000 | ORAL_TABLET | Freq: Two times a day (BID) | ORAL | 0 refills | Status: DC

## 2016-08-08 NOTE — Progress Notes (Signed)
BP 114/74 (BP Location: Left Arm, Patient Position: Sitting, Cuff Size: Normal)   Pulse 64   Temp 97.8 F (36.6 C)   Ht 5' 1.5" (1.562 m)   Wt 133 lb 12.8 oz (60.7 kg)   LMP  (LMP Unknown)   SpO2 98%   BMI 24.87 kg/m    Subjective:    Patient ID: Denise Everett, female    DOB: February 18, 1951, 65 y.o.   MRN: [id number]  HPI: Denise Everett is a 65 y.o. female  Chief Complaint  Patient presents with  . Hypertension    pt states that they keep telling Denise Everett at the cancer center that Denise Everett BP is elevated    Pt is here for f/u of Denise Everett BP as was elevated at the cancer center.  It is good today on 10 mg Lisinopril.  Review of those notes at the cancer center it was 157/82.  No headaches, dizzyness, SOB, or chest pain  Relevant past medical, surgical, family and social history reviewed and updated as indicated. Interim medical history since our last visit reviewed. Allergies and medications reviewed and updated.  Review of Systems  Per HPI unless specifically indicated above     Objective:    BP 114/74 (BP Location: Left Arm, Patient Position: Sitting, Cuff Size: Normal)   Pulse 64   Temp 97.8 F (36.6 C)   Ht 5' 1.5" (1.562 m)   Wt 133 lb 12.8 oz (60.7 kg)   LMP  (LMP Unknown)   SpO2 98%   BMI 24.87 kg/m   Wt Readings from Last 3 Encounters:  08/08/16 133 lb 12.8 oz (60.7 kg)  07/16/16 136 lb 11 oz (62 kg)  07/07/16 139 lb 12.8 oz (63.4 kg)    Physical Exam  Constitutional: She is oriented to person, place, and time. She appears well-developed and well-nourished. No distress.  HENT:  Head: Normocephalic and atraumatic.  Eyes: Conjunctivae and lids are normal. Right eye exhibits no discharge. Left eye exhibits no discharge. No scleral icterus.  Neck: Normal range of motion. Neck supple. No JVD present. Carotid bruit is not present.  Cardiovascular: Normal rate, regular rhythm and normal heart sounds.   Pulmonary/Chest: Effort normal and breath sounds normal.  Abdominal:  Normal appearance. There is no splenomegaly or hepatomegaly.  Musculoskeletal: Normal range of motion.  Neurological: She is alert and oriented to person, place, and time.  Skin: Skin is warm, dry and intact. No rash noted. No pallor.  Psychiatric: She has a normal mood and affect. Denise Everett behavior is normal. Judgment and thought content normal.    Results for orders placed or performed in visit on 07/16/16  CBC with Differential/Platelet  Result Value Ref Range   WBC 5.0 3.6 - 11.0 K/uL   RBC 3.99 3.80 - 5.20 MIL/uL   Hemoglobin 12.4 12.0 - 16.0 g/dL   HCT 35.9 35.0 - 47.0 %   MCV 90.0 80.0 - 100.0 fL   MCH 31.0 26.0 - 34.0 pg   MCHC 34.4 32.0 - 36.0 g/dL   RDW 13.8 11.5 - 14.5 %   Platelets 53 (L) 150 - 440 K/uL   Neutrophils Relative % 61% %   Neutro Abs 3.1 1.4 - 6.5 K/uL   Lymphocytes Relative 28% %   Lymphs Abs 1.4 1.0 - 3.6 K/uL   Monocytes Relative 8% %   Monocytes Absolute 0.4 0.2 - 0.9 K/uL   Eosinophils Relative 2% %   Eosinophils Absolute 0.1 0 - 0.7 K/uL  Basophils Relative 1% %   Basophils Absolute 0.1 0 - 0.1 K/uL  +    Assessment & Plan:   Problem List Items Addressed This Visit      Unprioritized   Hypertension    BP good today and since low normal I am uncomfortable with increasing Denise Everett Lisinopril       Other Visit Diagnoses    Need for influenza vaccination    -  Primary   Relevant Orders   Flu vaccine HIGH DOSE PF (Completed)       Follow up plan: Return if symptoms worsen or fail to improve.

## 2016-08-08 NOTE — Patient Instructions (Addendum)

## 2016-08-08 NOTE — Telephone Encounter (Signed)
Discussed with pt that I can no longer prescribe chronic opioids.  Will refer to pain clinc and give a 2 weeks supply of meds

## 2016-08-08 NOTE — Assessment & Plan Note (Signed)
BP good today and since low normal I am uncomfortable with increasing her Lisinopril

## 2016-08-20 DIAGNOSIS — D472 Monoclonal gammopathy: Secondary | ICD-10-CM | POA: Diagnosis not present

## 2016-08-20 DIAGNOSIS — N183 Chronic kidney disease, stage 3 (moderate): Secondary | ICD-10-CM | POA: Diagnosis not present

## 2016-08-20 DIAGNOSIS — I10 Essential (primary) hypertension: Secondary | ICD-10-CM | POA: Diagnosis not present

## 2016-08-20 DIAGNOSIS — E872 Acidosis: Secondary | ICD-10-CM | POA: Diagnosis not present

## 2016-08-22 ENCOUNTER — Telehealth: Payer: Self-pay

## 2016-08-22 NOTE — Telephone Encounter (Signed)
Patient called in wanting to know if [name] was going to refill her hydrocodone prescription.

## 2016-08-22 NOTE — Telephone Encounter (Signed)
Please check up front.  I believe I gave her a 2 week rx

## 2016-08-22 NOTE — Telephone Encounter (Signed)
Called and let patient know that rx was ready to be picked up at the front desk.

## 2016-09-17 DIAGNOSIS — E872 Acidosis: Secondary | ICD-10-CM | POA: Diagnosis not present

## 2016-09-17 DIAGNOSIS — I10 Essential (primary) hypertension: Secondary | ICD-10-CM | POA: Diagnosis not present

## 2016-09-17 DIAGNOSIS — N183 Chronic kidney disease, stage 3 (moderate): Secondary | ICD-10-CM | POA: Diagnosis not present

## 2016-09-17 DIAGNOSIS — D472 Monoclonal gammopathy: Secondary | ICD-10-CM | POA: Diagnosis not present

## 2016-09-26 ENCOUNTER — Other Ambulatory Visit: Payer: Self-pay | Admitting: Unknown Physician Specialty

## 2016-09-26 MED ORDER — HYDROCODONE-ACETAMINOPHEN 7.5-325 MG PO TABS: 1.0000 | ORAL_TABLET | Freq: Two times a day (BID) | ORAL | 0 refills | Status: DC

## 2016-10-08 ENCOUNTER — Encounter: Payer: Self-pay | Admitting: Unknown Physician Specialty

## 2016-10-08 ENCOUNTER — Ambulatory Visit (INDEPENDENT_AMBULATORY_CARE_PROVIDER_SITE_OTHER): Payer: Medicare Other | Admitting: Unknown Physician Specialty

## 2016-10-08 VITALS — BP 130/84 | HR 69 | Temp 98.0°F | Ht 61.7 in | Wt 136.0 lb

## 2016-10-08 DIAGNOSIS — Z0000 Encounter for general adult medical examination without abnormal findings: Secondary | ICD-10-CM | POA: Diagnosis not present

## 2016-10-08 DIAGNOSIS — Z7189 Other specified counseling: Secondary | ICD-10-CM | POA: Diagnosis not present

## 2016-10-08 DIAGNOSIS — G894 Chronic pain syndrome: Secondary | ICD-10-CM | POA: Diagnosis not present

## 2016-10-08 DIAGNOSIS — Z1239 Encounter for other screening for malignant neoplasm of breast: Secondary | ICD-10-CM

## 2016-10-08 DIAGNOSIS — Z1231 Encounter for screening mammogram for malignant neoplasm of breast: Secondary | ICD-10-CM | POA: Diagnosis not present

## 2016-10-08 DIAGNOSIS — I129 Hypertensive chronic kidney disease with stage 1 through stage 4 chronic kidney disease, or unspecified chronic kidney disease: Secondary | ICD-10-CM

## 2016-10-08 NOTE — Progress Notes (Signed)
BP 130/84 (BP Location: Left Arm, Patient Position: Sitting, Cuff Size: Normal)   Pulse 69   Temp 98 F (36.7 C)   Ht 5' 1.7" (1.567 m)   Wt 136 lb (61.7 kg)   LMP  (LMP Unknown)   SpO2 96%   BMI 25.12 kg/m    Subjective:    Patient ID: Denise Everett, female    DOB: 12/12/1950, 65 y.o.   MRN: [id number]  HPI: Denise Everett is a 65 y.o. female  Chief Complaint  Patient presents with  . Hyperlipidemia  . Hypertension  . Pain    pt states if she has to be referred to a pain clinic, she has to be seen before the end of December or not until the end of January because of Transportation   Hypertension Using medications without difficulty Average home BPs   No problems or lightheadedness No chest pain with exertion or shortness of breath No Edema  CKD Seeing nephrology  Hyperlipidemia Using medications without problems: No Muscle aches  Diet compliance/ Exercise: Walking a lot  Chronic pain Takes pain meds twice a day for her leg pain.  States it wears off after 6 hours.  Pt states she walks faster with her pain meds and allows her to do more.  She does not drive.  She was referred to a pain clinic and I am prescribing medications until she can be seen.    Relevant past medical, surgical, family and social history reviewed and updated as indicated. Interim medical history since our last visit reviewed. Allergies and medications reviewed and updated.  Review of Systems  Per HPI unless specifically indicated above     Objective:    BP 130/84 (BP Location: Left Arm, Patient Position: Sitting, Cuff Size: Normal)   Pulse 69   Temp 98 F (36.7 C)   Ht 5' 1.7" (1.567 m)   Wt 136 lb (61.7 kg)   LMP  (LMP Unknown)   SpO2 96%   BMI 25.12 kg/m   Wt Readings from Last 3 Encounters:  10/08/16 136 lb (61.7 kg)  08/08/16 133 lb 12.8 oz (60.7 kg)  07/16/16 136 lb 11 oz (62 kg)    Physical Exam  Constitutional: She is oriented to person, place, and time. She  appears well-developed and well-nourished. No distress.  HENT:  Head: Normocephalic and atraumatic.  Eyes: Conjunctivae and lids are normal. Right eye exhibits no discharge. Left eye exhibits no discharge. No scleral icterus.  Neck: Normal range of motion. Neck supple. No JVD present. Carotid bruit is not present.  Cardiovascular: Normal rate, regular rhythm and normal heart sounds.   Pulmonary/Chest: Effort normal and breath sounds normal.  Abdominal: Normal appearance. There is no splenomegaly or hepatomegaly.  Musculoskeletal: Normal range of motion.  Neurological: She is alert and oriented to person, place, and time.  Skin: Skin is warm, dry and intact. No rash noted. No pallor.  Psychiatric: She has a normal mood and affect. Her behavior is normal. Judgment and thought content normal.    Results for orders placed or performed in visit on 07/16/16  CBC with Differential/Platelet  Result Value Ref Range   WBC 5.0 3.6 - 11.0 K/uL   RBC 3.99 3.80 - 5.20 MIL/uL   Hemoglobin 12.4 12.0 - 16.0 g/dL   HCT 35.9 35.0 - 47.0 %   MCV 90.0 80.0 - 100.0 fL   MCH 31.0 26.0 - 34.0 pg   MCHC 34.4 32.0 - 36.0 g/dL  RDW 13.8 11.5 - 14.5 %   Platelets 53 (L) 150 - 440 K/uL   Neutrophils Relative % 61% %   Neutro Abs 3.1 1.4 - 6.5 K/uL   Lymphocytes Relative 28% %   Lymphs Abs 1.4 1.0 - 3.6 K/uL   Monocytes Relative 8% %   Monocytes Absolute 0.4 0.2 - 0.9 K/uL   Eosinophils Relative 2% %   Eosinophils Absolute 0.1 0 - 0.7 K/uL   Basophils Relative 1% %   Basophils Absolute 0.1 0 - 0.1 K/uL      Assessment & Plan:   Problem List Items Addressed This Visit      Unprioritized   Advance care planning    She wants her brother Fritz Pickerel to make health care decisions for her      Chronic pain   Hypertensive CKD (chronic kidney disease)   Relevant Orders   Comprehensive metabolic panel    Other Visit Diagnoses    Breast cancer screening    -  Primary   Relevant Orders   MM DIGITAL  SCREENING BILATERAL   Routine general medical examination at a health care facility       Relevant Orders   Hepatitis C antibody   MM DIGITAL SCREENING BILATERAL       Follow up plan: Return in about 3 months (around 01/07/2017).

## 2016-10-08 NOTE — Patient Instructions (Signed)
Please do call to schedule your mammogram; the number to schedule one at either [facility] or [facility] is [phone]   

## 2016-10-08 NOTE — Assessment & Plan Note (Signed)
She wants her brother Fritz Pickerel to make health care decisions for her

## 2016-10-09 ENCOUNTER — Encounter: Payer: Self-pay | Admitting: Unknown Physician Specialty

## 2016-10-09 LAB — COMPREHENSIVE METABOLIC PANEL
A/G RATIO: 1.7 (ref 1.2–2.2)
ALT: 5 IU/L (ref 0–32)
AST: 10 IU/L (ref 0–40)
Albumin: 4.5 g/dL (ref 3.6–4.8)
Alkaline Phosphatase: 63 IU/L (ref 39–117)
BILIRUBIN TOTAL: 0.2 mg/dL (ref 0.0–1.2)
BUN / CREAT RATIO: 20 (ref 12–28)
BUN: 30 mg/dL — ABNORMAL HIGH (ref 8–27)
CHLORIDE: 99 mmol/L (ref 96–106)
CO2: 19 mmol/L (ref 18–29)
Calcium: 9.2 mg/dL (ref 8.7–10.3)
Creatinine, Ser: 1.49 mg/dL — ABNORMAL HIGH (ref 0.57–1.00)
GFR calc non Af Amer: 36 mL/min/{1.73_m2} — ABNORMAL LOW (ref >59)
GFR, EST AFRICAN AMERICAN: 42 mL/min/{1.73_m2} — AB (ref >59)
Globulin, Total: 2.6 g/dL (ref 1.5–4.5)
Glucose: 99 mg/dL (ref 65–99)
POTASSIUM: 5.4 mmol/L — AB (ref 3.5–5.2)
Sodium: 133 mmol/L — ABNORMAL LOW (ref 134–144)
TOTAL PROTEIN: 7.1 g/dL (ref 6.0–8.5)

## 2016-10-09 LAB — HEPATITIS C ANTIBODY

## 2016-10-15 ENCOUNTER — Inpatient Hospital Stay: Payer: Medicare Other

## 2016-10-17 DIAGNOSIS — H04223 Epiphora due to insufficient drainage, bilateral lacrimal glands: Secondary | ICD-10-CM | POA: Diagnosis not present

## 2016-10-20 ENCOUNTER — Telehealth: Payer: Self-pay

## 2016-10-20 NOTE — Telephone Encounter (Signed)
Patient called in and wanted to know if there was a pain management doctor she could see closer than [facility]. Patient states that somebody has to go with her to her appointment and she thinks [place] may be too far. I told the patient that I would ask out referral coordinator and then call her back and let her know.

## 2016-10-20 NOTE — Telephone Encounter (Signed)
Called and let patient know what Keri said. Patient stated that she could not go to [facility] without somebody going with her so she would like the referral to be sent to [facility]. Patient stated that she was OK with being put on the wait list and that she was OK without her pain medication for right now. She stated that [name] did not have to write it if she did not want to.

## 2016-10-20 NOTE — Telephone Encounter (Signed)
I can send patient to [facility] but they are on a wait.  [facility] I think is booked, last time I tried calling I couldn't even get the phone to go through. I'm not sure what's going on with [facility].   She'd get in a lot quicker at [facility] in [place].

## 2016-10-21 DIAGNOSIS — Z1231 Encounter for screening mammogram for malignant neoplasm of breast: Secondary | ICD-10-CM | POA: Diagnosis not present

## 2016-10-27 ENCOUNTER — Telehealth: Payer: Self-pay | Admitting: Unknown Physician Specialty

## 2016-10-27 MED ORDER — HYDROCODONE-ACETAMINOPHEN 7.5-325 MG PO TABS: 1.0000 | ORAL_TABLET | Freq: Two times a day (BID) | ORAL | 0 refills | Status: DC

## 2016-10-27 NOTE — Telephone Encounter (Signed)
Pt.notified

## 2016-10-27 NOTE — Telephone Encounter (Signed)
Called and spoke to patient. Patient stated that she is having bilateral leg pain and wants to know if [name] can write her pain medication to get her to her appointment with the pain clinic on 11/14/16.

## 2016-10-29 ENCOUNTER — Inpatient Hospital Stay: Payer: Medicare Other | Attending: Oncology

## 2016-11-17 ENCOUNTER — Other Ambulatory Visit: Payer: Medicare Other

## 2016-11-18 ENCOUNTER — Telehealth: Payer: Self-pay | Admitting: Unknown Physician Specialty

## 2016-11-18 NOTE — Telephone Encounter (Signed)
Called and spoke to patient. She stated that she had an appointment with the pain clinic scheduled but her Medicaid was not correct. She stated that the medicaid wouldn't be fixed until the second week in January so she had to move her appointment with the pain clinic until early February. Patient wants to know if her pain medication can be refilled until her appointment with the pain clinic. I let patient know that [name] was out of the office and that she would access this message tomorrow.

## 2016-11-19 MED ORDER — HYDROCODONE-ACETAMINOPHEN 7.5-325 MG PO TABS: 1.0000 | ORAL_TABLET | Freq: Two times a day (BID) | ORAL | 0 refills | Status: DC

## 2016-11-19 NOTE — Telephone Encounter (Signed)
Tried calling patient to let her know rx was ready for pick up, got busy signal. Will try to call again later.

## 2016-11-19 NOTE — Telephone Encounter (Signed)
Yes, I will refill it until she sees the clinic

## 2016-11-20 NOTE — Telephone Encounter (Signed)
Called and let patient know that RX was ready for pick up.

## 2016-11-28 ENCOUNTER — Telehealth: Payer: Self-pay

## 2016-11-28 DIAGNOSIS — G894 Chronic pain syndrome: Secondary | ICD-10-CM

## 2016-11-28 NOTE — Telephone Encounter (Signed)
Or does the patient need to be seen urgently?

## 2016-11-28 NOTE — Telephone Encounter (Signed)
Referral directed Urgently to [facility].

## 2016-11-28 NOTE — Telephone Encounter (Signed)
Called and let patient know that referral was redirected to [facility]. Will call Aris Everts, the [facility] Transportation Coordinator with DSS, and let her know that patient has been referred back to [facility].

## 2016-11-28 NOTE — Telephone Encounter (Signed)
Called and left Patty a VM letting her know that the patient will be going to see pain management at [facility] now and not [place]. Will place letter in scan box to be scanned into patient's chart.

## 2016-11-28 NOTE — Telephone Encounter (Signed)
Received a fax from the [facility] at [place]. They need [place] to fill out a form stating why the patient cannot be seen at [facility] to justify them taking her to [facility] in [place]. Called and let patient know this and she stated that she was OK with waiting to go to [facility]. She stated that she has been dealing with the pain for 5 years and can go a little bit longer. She stated that she would rather go to [facility] and have someone go with her. [name], is it OK for the patient to wait to go to [facility]?

## 2016-12-01 ENCOUNTER — Inpatient Hospital Stay: Payer: Medicare Other | Attending: Oncology

## 2016-12-01 DIAGNOSIS — D693 Immune thrombocytopenic purpura: Secondary | ICD-10-CM

## 2016-12-01 LAB — CBC WITH DIFFERENTIAL/PLATELET
Basophils Absolute: 0.1 10*3/uL (ref 0–0.1)
Basophils Relative: 1 %
EOS ABS: 0.1 10*3/uL (ref 0–0.7)
Eosinophils Relative: 1 %
HEMATOCRIT: 36.3 % (ref 35.0–47.0)
HEMOGLOBIN: 12.4 g/dL (ref 12.0–16.0)
LYMPHS ABS: 1.9 10*3/uL (ref 1.0–3.6)
Lymphocytes Relative: 32 %
MCH: 30.7 pg (ref 26.0–34.0)
MCHC: 34.2 g/dL (ref 32.0–36.0)
MCV: 89.7 fL (ref 80.0–100.0)
MONOS PCT: 8 %
Monocytes Absolute: 0.5 10*3/uL (ref 0.2–0.9)
NEUTROS ABS: 3.4 10*3/uL (ref 1.4–6.5)
NEUTROS PCT: 58 %
Platelets: 45 10*3/uL — ABNORMAL LOW (ref 150–440)
RBC: 4.04 MIL/uL (ref 3.80–5.20)
RDW: 13.8 % (ref 11.5–14.5)
WBC: 5.8 10*3/uL (ref 3.6–11.0)

## 2016-12-24 ENCOUNTER — Telehealth: Payer: Self-pay | Admitting: Unknown Physician Specialty

## 2016-12-24 MED ORDER — HYDROCODONE-ACETAMINOPHEN 7.5-325 MG PO TABS: 1.0000 | ORAL_TABLET | Freq: Two times a day (BID) | ORAL | 0 refills | Status: DC

## 2016-12-24 NOTE — Telephone Encounter (Signed)
Routing to provider and referral coordinator. [name] are you willing to refill this medication for the patient? Keri, can we find out what is going on with this patient's urgent referral to the pain clinic?

## 2016-12-24 NOTE — Telephone Encounter (Signed)
I will continue to refill, but pt needs to know I cannot do this indefinitely and she will have to go to [facility] if we don't hear back soon.

## 2016-12-24 NOTE — Telephone Encounter (Signed)
In January I called patient because she requested [facility]. I explained that [facility] was on low staff and it could take a few weeks for them to get back to her.  I asked patient if she wanted to go to [facility] in [place] and patient said yes. Later, patient explained she didn't want to go to [facility] and she would also have to go to [facility] because of her insurance (medicaid).   The referral is still pending for Dr. Baruch Merl to review. Patient can call [facility] and see the status of her referral.

## 2016-12-24 NOTE — Telephone Encounter (Signed)
Called and let patient know that Denise Everett wrote her prescription and that it was ready to be picked up. I also gave the patient [facility] phone number and asked her to give them a call about her referral. Patient stated she would do this no later than tomorrow.

## 2017-01-06 ENCOUNTER — Ambulatory Visit (INDEPENDENT_AMBULATORY_CARE_PROVIDER_SITE_OTHER): Payer: Medicare Other | Admitting: Unknown Physician Specialty

## 2017-01-06 ENCOUNTER — Encounter: Payer: Self-pay | Admitting: Unknown Physician Specialty

## 2017-01-06 DIAGNOSIS — I10 Essential (primary) hypertension: Secondary | ICD-10-CM

## 2017-01-06 DIAGNOSIS — G894 Chronic pain syndrome: Secondary | ICD-10-CM

## 2017-01-06 DIAGNOSIS — N183 Chronic kidney disease, stage 3 unspecified: Secondary | ICD-10-CM

## 2017-01-06 DIAGNOSIS — F17200 Nicotine dependence, unspecified, uncomplicated: Secondary | ICD-10-CM | POA: Diagnosis not present

## 2017-01-06 DIAGNOSIS — E7800 Pure hypercholesterolemia, unspecified: Secondary | ICD-10-CM | POA: Diagnosis not present

## 2017-01-06 MED ORDER — HYDROCODONE-ACETAMINOPHEN 7.5-325 MG PO TABS: 1.0000 | ORAL_TABLET | Freq: Two times a day (BID) | ORAL | 0 refills | Status: DC

## 2017-01-06 NOTE — Progress Notes (Signed)
BP 131/80 (BP Location: Left Arm, Patient Position: Sitting, Cuff Size: Large)   Pulse (!) 59   Temp 97.7 F (36.5 C)   Ht 5' 1.5" (1.562 m)   Wt 138 lb (62.6 kg)   LMP  (LMP Unknown)   SpO2 100%   BMI 25.65 kg/m    Subjective:    Patient ID: Denise Everett, female    DOB: 06-05-1951, 66 y.o.   MRN: [id number]  HPI: Denise Everett is a 66 y.o. female  Chief Complaint  Patient presents with  . Hyperlipidemia  . Hypertension  . Pain    pt states she talked to the pain clinic and they will call her mid March or the beginning of April to schedule appointment    Hypertension Using medications without difficulty Average home BPs Not Checking  No problems or lightheadedness No chest pain with exertion or shortness of breath No Edema   Hyperlipidemia Using medications without problems: No Muscle aches  Diet compliance:watches what she eats Exercise: Does a lot of walking  Chronic pain Takes pain meds mostly once a day for her leg pain which has been evaluated by vascular.  No change in pain on or off cholesterol medications. States it wears off after 6 hours. Pt states she walks faster with her pain meds and allows her to do more. She does not drive.  She was referred to a pain clinic and delayed visit from multiple administrative reasons but will be able to be seen this spring.    Social History   Social History  . Marital status: Single    Spouse name: N/A  . Number of children: N/A  . Years of education: N/A   Occupational History  . Not on file.   Social History Main Topics  . Smoking status: Current Some Day Smoker    Packs/day: 0.25    Types: Cigarettes  . Smokeless tobacco: Former Systems developer    Types: Snuff  . Alcohol use No  . Drug use: No  . Sexual activity: Yes   Other Topics Concern  . Not on file   Social History Narrative  . No narrative on file     Relevant past medical, surgical, family and social history reviewed and updated as indicated.  Interim medical history since our last visit reviewed. Allergies and medications reviewed and updated.  Review of Systems  Per HPI unless specifically indicated above     Objective:    BP 131/80 (BP Location: Left Arm, Patient Position: Sitting, Cuff Size: Large)   Pulse (!) 59   Temp 97.7 F (36.5 C)   Ht 5' 1.5" (1.562 m)   Wt 138 lb (62.6 kg)   LMP  (LMP Unknown)   SpO2 100%   BMI 25.65 kg/m   Wt Readings from Last 3 Encounters:  01/06/17 138 lb (62.6 kg)  10/08/16 136 lb (61.7 kg)  08/08/16 133 lb 12.8 oz (60.7 kg)    Physical Exam  Constitutional: She is oriented to person, place, and time. She appears well-developed and well-nourished. No distress.  HENT:  Head: Normocephalic and atraumatic.  Eyes: Conjunctivae and lids are normal. Right eye exhibits no discharge. Left eye exhibits no discharge. No scleral icterus.  Neck: Normal range of motion. Neck supple. No JVD present. Carotid bruit is not present.  Cardiovascular: Normal rate, regular rhythm and normal heart sounds.   Pulmonary/Chest: Effort normal and breath sounds normal.  Abdominal: Normal appearance. There is no splenomegaly or hepatomegaly.  Musculoskeletal: Normal range of motion.  Neurological: She is alert and oriented to person, place, and time.  Skin: Skin is warm, dry and intact. No rash noted. No pallor.  Psychiatric: She has a normal mood and affect. Her behavior is normal. Judgment and thought content normal.       Assessment & Plan:   Problem List Items Addressed This Visit      Unprioritized   Chronic pain    Refill prescription today.  Hopefully will be in pain management soon      Relevant Medications   HYDROcodone-acetaminophen (NORCO) 7.5-325 MG tablet   CKD (chronic kidney disease), stage III    Per Dr. Clarita Crane      Hyperlipidemia    Stable last check      Hypertension    Stable, continue present medications.  Labs will be done through [facility]       Tobacco use  disorder    Low dose CT screen          Follow up plan: Return in about 6 months (around 07/06/2017).

## 2017-01-06 NOTE — Assessment & Plan Note (Signed)
Stable, continue present medications.  Labs will be done through [facility]

## 2017-01-06 NOTE — Assessment & Plan Note (Signed)
Low dose CT screen

## 2017-01-06 NOTE — Assessment & Plan Note (Addendum)
Per Dr. Clarita Crane

## 2017-01-06 NOTE — Assessment & Plan Note (Signed)
Refill prescription today.  Hopefully will be in pain management soon

## 2017-01-06 NOTE — Assessment & Plan Note (Signed)
Stable last check

## 2017-01-09 ENCOUNTER — Other Ambulatory Visit: Payer: Self-pay | Admitting: Unknown Physician Specialty

## 2017-01-13 ENCOUNTER — Telehealth: Payer: Self-pay | Admitting: *Deleted

## 2017-01-13 DIAGNOSIS — Z87891 Personal history of nicotine dependence: Secondary | ICD-10-CM

## 2017-01-13 NOTE — Progress Notes (Signed)
[place]  [phone] [phone]  ID: Denise Everett OB: [birth year]  MR#: [id number]  TKZ#:[id number]  Patient Care Team: [name], NP as PCP - General (Nurse Practitioner)  CHIEF COMPLAINT: MGUS and ITP  INTERVAL HISTORY: Patient returns to clinic today for routine 6 month evaluation and laboratory work. She continues to feel well and is asymptomatic.  She denies fevers or night sweats. She has a decreased appetite, but no further weight loss. She does not complain of easy bleeding or bruising today.  She has no neurologic complaints. She denies any chest pain or shortness of breath.  She has a good appetite. She denies any nausea, vomiting, constipation, or diarrhea.  She has no melena or hematochezia.  She has no urinary complaints.  Patient offers no further specific complaints today.  REVIEW OF SYSTEMS:   Review of Systems  Constitutional: Negative for fever, malaise/fatigue and weight loss.  Respiratory: Negative.  Negative for cough and shortness of breath.   Cardiovascular: Negative.  Negative for chest pain and leg swelling.  Gastrointestinal: Negative.  Negative for abdominal pain.  Musculoskeletal: Negative.   Neurological: Negative.  Negative for weakness.  Endo/Heme/Allergies: Does not bruise/bleed easily.  Psychiatric/Behavioral: The patient is not nervous/anxious.     As per HPI. Otherwise, a complete review of systems is negative.  PAST MEDICAL HISTORY: Past Medical History:  Diagnosis Date  . Allergy   . Cataract   . Chronic kidney disease   . Chronic pain syndrome   . Hyperlipidemia   . Hypertension   . Leg pain   . Osteoporosis    hips  . Renal insufficiency   . Schizophrenia ([place])   . Thrombocytopenia ([place])   . Tobacco use disorder     PAST SURGICAL HISTORY: Past Surgical History:  Procedure Laterality Date  . ankle surgery Right    x's 2  . CATARACT EXTRACTION      FAMILY HISTORY Family History    Problem Relation Age of Onset  . Hyperlipidemia Mother   . Hypertension Mother        ADVANCED DIRECTIVES:    HEALTH MAINTENANCE: Social History  Substance Use Topics  . Smoking status: Current Some Day Smoker    Packs/day: 0.25    Types: Cigarettes  . Smokeless tobacco: Former Systems developer    Types: Snuff  . Alcohol use No     Colonoscopy:  PAP:  Bone density:  Lipid panel:  No Known Allergies  Current Outpatient Prescriptions  Medication Sig Dispense Refill  . atorvastatin (LIPITOR) 10 MG tablet Take 1 tablet (10 mg total) by mouth daily at 6 PM. 90 tablet 3  . HYDROcodone-acetaminophen (NORCO) 7.5-325 MG tablet Take 1 tablet by mouth 2 (two) times daily. 28 tablet 0  . lisinopril (PRINIVIL,ZESTRIL) 10 MG tablet TAKE ONE TABLET BY MOUTH DAILY 90 tablet 1  . raloxifene (EVISTA) 60 MG tablet Take 1 tablet (60 mg total) by mouth daily. 90 tablet 3  . risperiDONE (RISPERDAL) 2 MG tablet Take 0.5-1 tablets (1-2 mg total) by mouth at bedtime. 90 tablet 3  . sodium bicarbonate 650 MG tablet Take 650 mg by mouth 2 (two) times daily.     No current facility-administered medications for this visit.     OBJECTIVE: Vitals:   01/14/17 1038  BP: 116/75  Pulse: 80  Resp: 18  Temp: (!) 96.4 F (35.8 C)     Body mass index is 25.74 kg/m.    ECOG FS:0 - Asymptomatic  General: Well-developed, well-nourished, no acute distress. Eyes: Pink conjunctiva, anicteric sclera. Lungs: Clear to auscultation bilaterally. Heart: Regular rate and rhythm. No rubs, murmurs, or gallops. Abdomen: Soft, nontender, nondistended. No organomegaly noted, normoactive bowel sounds. Musculoskeletal: No edema, cyanosis, or clubbing. Neuro: Alert, answering all questions appropriately. Cranial nerves grossly intact. Skin: No rashes or petechiae noted. Occasional ecchymoses noted. Psych: Normal affect.   LAB RESULTS:  Lab Results  Component Value Date   NA 133 (L) 10/08/2016   K 5.4 (H) 10/08/2016    CL 99 10/08/2016   CO2 19 10/08/2016   GLUCOSE 99 10/08/2016   BUN 30 (H) 10/08/2016   CREATININE 1.49 (H) 10/08/2016   CALCIUM 9.2 10/08/2016   PROT 7.1 10/08/2016   ALBUMIN 4.5 10/08/2016   AST 10 10/08/2016   ALT 5 10/08/2016   ALKPHOS 63 10/08/2016   BILITOT 0.2 10/08/2016   GFRNONAA 36 (L) 10/08/2016   GFRAA 42 (L) 10/08/2016    Lab Results  Component Value Date   WBC 5.6 01/14/2017   NEUTROABS 3.6 01/14/2017   HGB 11.6 (L) 01/14/2017   HCT 33.9 (L) 01/14/2017   MCV 89.6 01/14/2017   PLT 60 (L) 01/14/2017     STUDIES: No results found.  ASSESSMENT: MGUS and ITP.  PLAN:    1.  ITP: Previously, bone marrow biopsy confirmed the results. Patient's platelet count is decreased, but stable. She does not require treatment at this time. She last received Rituxan in September 2013. Return to clinic in 6 months for laboratory work and further evaluation. Of note, patient had a response to prednisone but it was not durable.    2. MGUS:  Bone marrow biopsy revealed less than 5% plasma cells.  Continue to monitor SIEP once per year. 3. Weight loss: Patient's weight has been essentially unchanged since November 2016. Monitor. 4. Smoking history: Patient's primary care referred her to lung screening clinic which is scheduled for January 27, 2017.  Patient expressed understanding and was in agreement with this plan. She also understands that She can call clinic at any time with any questions, concerns, or complaints.    Lloyd Huger, MD   01/14/2017 10:46 AM

## 2017-01-13 NOTE — Telephone Encounter (Signed)
Received referral for initial lung cancer screening scan. Contacted patient and obtained smoking history,(current, 43 pack year) as well as answering questions related to screening process. Patient denies signs of lung cancer such as weight loss or hemoptysis. Patient denies comorbidity that would prevent curative treatment if lung cancer were found. Patient is tentatively scheduled for shared decision making visit and CT scan on 01/27/17, pending insurance approval from business office.

## 2017-01-14 ENCOUNTER — Inpatient Hospital Stay: Payer: Medicare Other | Attending: Oncology

## 2017-01-14 ENCOUNTER — Inpatient Hospital Stay (HOSPITAL_BASED_OUTPATIENT_CLINIC_OR_DEPARTMENT_OTHER): Payer: Medicare Other | Admitting: Oncology

## 2017-01-14 ENCOUNTER — Encounter: Payer: Self-pay | Admitting: *Deleted

## 2017-01-14 VITALS — BP 116/75 | HR 80 | Temp 96.4°F | Resp 18 | Wt 138.4 lb

## 2017-01-14 DIAGNOSIS — G894 Chronic pain syndrome: Secondary | ICD-10-CM | POA: Insufficient documentation

## 2017-01-14 DIAGNOSIS — R634 Abnormal weight loss: Secondary | ICD-10-CM

## 2017-01-14 DIAGNOSIS — D472 Monoclonal gammopathy: Secondary | ICD-10-CM

## 2017-01-14 DIAGNOSIS — F209 Schizophrenia, unspecified: Secondary | ICD-10-CM | POA: Diagnosis not present

## 2017-01-14 DIAGNOSIS — F17210 Nicotine dependence, cigarettes, uncomplicated: Secondary | ICD-10-CM

## 2017-01-14 DIAGNOSIS — I129 Hypertensive chronic kidney disease with stage 1 through stage 4 chronic kidney disease, or unspecified chronic kidney disease: Secondary | ICD-10-CM

## 2017-01-14 DIAGNOSIS — E785 Hyperlipidemia, unspecified: Secondary | ICD-10-CM | POA: Diagnosis not present

## 2017-01-14 DIAGNOSIS — D693 Immune thrombocytopenic purpura: Secondary | ICD-10-CM

## 2017-01-14 DIAGNOSIS — Z029 Encounter for administrative examinations, unspecified: Secondary | ICD-10-CM | POA: Insufficient documentation

## 2017-01-14 DIAGNOSIS — M818 Other osteoporosis without current pathological fracture: Secondary | ICD-10-CM | POA: Insufficient documentation

## 2017-01-14 DIAGNOSIS — Z79899 Other long term (current) drug therapy: Secondary | ICD-10-CM | POA: Insufficient documentation

## 2017-01-14 DIAGNOSIS — R630 Anorexia: Secondary | ICD-10-CM | POA: Insufficient documentation

## 2017-01-14 DIAGNOSIS — N189 Chronic kidney disease, unspecified: Secondary | ICD-10-CM | POA: Diagnosis not present

## 2017-01-14 LAB — CBC WITH DIFFERENTIAL/PLATELET
Basophils Absolute: 0.1 10*3/uL (ref 0–0.1)
Basophils Relative: 1 %
EOS ABS: 0.1 10*3/uL (ref 0–0.7)
EOS PCT: 1 %
HCT: 33.9 % — ABNORMAL LOW (ref 35.0–47.0)
Hemoglobin: 11.6 g/dL — ABNORMAL LOW (ref 12.0–16.0)
LYMPHS ABS: 1.4 10*3/uL (ref 1.0–3.6)
Lymphocytes Relative: 26 %
MCH: 30.7 pg (ref 26.0–34.0)
MCHC: 34.2 g/dL (ref 32.0–36.0)
MCV: 89.6 fL (ref 80.0–100.0)
MONO ABS: 0.5 10*3/uL (ref 0.2–0.9)
MONOS PCT: 8 %
Neutro Abs: 3.6 10*3/uL (ref 1.4–6.5)
Neutrophils Relative %: 64 %
PLATELETS: 60 10*3/uL — AB (ref 150–440)
RBC: 3.78 MIL/uL — AB (ref 3.80–5.20)
RDW: 13.6 % (ref 11.5–14.5)
WBC: 5.6 10*3/uL (ref 3.6–11.0)

## 2017-01-14 NOTE — Progress Notes (Signed)
Complains of decreased appetite. Has been seen by PCP, [name], who referred pt to lung CT screening program due to pts symptoms and smoking history. Pt is scheduled for CT screen on 3/20. Pt was encouraged to keep appt.

## 2017-01-23 ENCOUNTER — Telehealth: Payer: Self-pay | Admitting: Unknown Physician Specialty

## 2017-01-23 MED ORDER — HYDROCODONE-ACETAMINOPHEN 7.5-325 MG PO TABS: 1.0000 | ORAL_TABLET | Freq: Two times a day (BID) | ORAL | 0 refills | Status: DC

## 2017-01-23 NOTE — Telephone Encounter (Signed)
Called and let patient know that rx was ready to be picked up.

## 2017-01-23 NOTE — Telephone Encounter (Signed)
Patient called to see if her hydrocodone was available to pick up or if [name] was going to give the script to her this month.  [phone]

## 2017-01-23 NOTE — Telephone Encounter (Signed)
Routing to provider  

## 2017-01-27 ENCOUNTER — Other Ambulatory Visit: Payer: Self-pay | Admitting: *Deleted

## 2017-01-27 ENCOUNTER — Ambulatory Visit: Payer: Medicare Other

## 2017-01-27 ENCOUNTER — Inpatient Hospital Stay: Payer: Medicare Other | Admitting: Oncology

## 2017-01-27 DIAGNOSIS — Z87891 Personal history of nicotine dependence: Secondary | ICD-10-CM

## 2017-02-03 ENCOUNTER — Ambulatory Visit: Payer: Medicare Other

## 2017-02-03 ENCOUNTER — Ambulatory Visit
Admission: RE | Admit: 2017-02-03 | Discharge: 2017-02-03 | Disposition: A | Discharge status: Home / Self Care | Payer: Medicare Other | Source: Ambulatory Visit | Attending: Oncology | Admitting: Oncology

## 2017-02-03 ENCOUNTER — Inpatient Hospital Stay (HOSPITAL_BASED_OUTPATIENT_CLINIC_OR_DEPARTMENT_OTHER): Payer: Medicare Other | Admitting: Oncology

## 2017-02-03 ENCOUNTER — Encounter: Payer: Self-pay | Admitting: Oncology

## 2017-02-03 DIAGNOSIS — Z122 Encounter for screening for malignant neoplasm of respiratory organs: Secondary | ICD-10-CM

## 2017-02-03 DIAGNOSIS — Z87891 Personal history of nicotine dependence: Secondary | ICD-10-CM | POA: Diagnosis not present

## 2017-02-03 DIAGNOSIS — J439 Emphysema, unspecified: Secondary | ICD-10-CM | POA: Diagnosis not present

## 2017-02-03 NOTE — Assessment & Plan Note (Signed)
In accordance with CMS guidelines, patient has met eligibility criteria including age, absence of signs or symptoms of lung cancer.  Social History  Substance Use Topics  . Smoking status: Current Every Day Smoker    Packs/day: 1.00    Years: 43.00    Types: Cigarettes  . Smokeless tobacco: Former Systems developer    Types: Snuff  . Alcohol use No     A shared decision-making session was conducted prior to the performance of CT scan. This includes one or more decision aids, includes benefits and harms of screening, follow-up diagnostic testing, over-diagnosis, false positive rate, and total radiation exposure.  Counseling on the importance of adherence to annual lung cancer LDCT screening, impact of co-morbidities, and ability or willingness to undergo diagnosis and treatment is imperative for compliance of the program.  Counseling on the importance of continued smoking cessation for former smokers; the importance of smoking cessation for current smokers, and information about tobacco cessation interventions have been given to patient including [place] and 1800 quit [place] programs.  Written order for lung cancer screening with LDCT has been given to the patient and any and all questions have been answered to the best of my abilities.   Yearly follow up will be coordinated by Burgess Estelle, Thoracic Navigator.

## 2017-02-03 NOTE — Progress Notes (Signed)
Personal history of tobacco use, presenting hazards to health In accordance with CMS guidelines, patient has met eligibility criteria including age, absence of signs or symptoms of lung cancer.  Social History  Substance Use Topics  . Smoking status: Current Every Day Smoker    Packs/day: 1.00    Years: 43.00    Types: Cigarettes  . Smokeless tobacco: Former Systems developer    Types: Snuff  . Alcohol use No     A shared decision-making session was conducted prior to the performance of CT scan. This includes one or more decision aids, includes benefits and harms of screening, follow-up diagnostic testing, over-diagnosis, false positive rate, and total radiation exposure.  Counseling on the importance of adherence to annual lung cancer LDCT screening, impact of co-morbidities, and ability or willingness to undergo diagnosis and treatment is imperative for compliance of the program.  Counseling on the importance of continued smoking cessation for former smokers; the importance of smoking cessation for current smokers, and information about tobacco cessation interventions have been given to patient including [place] and 1800 quit [place] programs.  Written order for lung cancer screening with LDCT has been given to the patient and any and all questions have been answered to the best of my abilities.   Yearly follow up will be coordinated by Burgess Estelle, Thoracic Navigator.   Lucendia Herrlich, NP  02/03/17 1:55 PM

## 2017-02-05 ENCOUNTER — Telehealth: Payer: Self-pay | Admitting: Unknown Physician Specialty

## 2017-02-05 ENCOUNTER — Encounter: Payer: Self-pay | Admitting: *Deleted

## 2017-02-05 NOTE — Telephone Encounter (Signed)
Pt would like to know if she could get a refill on her pain meds. She stated that she had an appt with the pain clinic 02/24/17.

## 2017-02-06 MED ORDER — HYDROCODONE-ACETAMINOPHEN 7.5-325 MG PO TABS: 1.0000 | ORAL_TABLET | Freq: Two times a day (BID) | ORAL | 0 refills | Status: DC

## 2017-02-06 NOTE — Telephone Encounter (Signed)
Routing to provider  

## 2017-02-06 NOTE — Telephone Encounter (Signed)
Called and let patient know that prescription was ready to be picked up.

## 2017-02-18 DIAGNOSIS — E872 Acidosis: Secondary | ICD-10-CM | POA: Diagnosis not present

## 2017-02-18 DIAGNOSIS — N183 Chronic kidney disease, stage 3 (moderate): Secondary | ICD-10-CM | POA: Diagnosis not present

## 2017-02-18 DIAGNOSIS — D472 Monoclonal gammopathy: Secondary | ICD-10-CM | POA: Diagnosis not present

## 2017-02-18 DIAGNOSIS — I10 Essential (primary) hypertension: Secondary | ICD-10-CM | POA: Diagnosis not present

## 2017-02-24 ENCOUNTER — Telehealth: Payer: Self-pay | Admitting: Unknown Physician Specialty

## 2017-02-24 ENCOUNTER — Encounter (INDEPENDENT_AMBULATORY_CARE_PROVIDER_SITE_OTHER): Payer: Self-pay

## 2017-02-24 ENCOUNTER — Ambulatory Visit: Payer: Medicare Other | Attending: Pain Medicine | Admitting: Pain Medicine

## 2017-02-24 ENCOUNTER — Encounter: Payer: Self-pay | Admitting: Pain Medicine

## 2017-02-24 ENCOUNTER — Telehealth: Payer: Self-pay

## 2017-02-24 VITALS — BP 132/79 | HR 99 | Temp 97.4°F | Ht 66.0 in | Wt 140.0 lb

## 2017-02-24 DIAGNOSIS — M25571 Pain in right ankle and joints of right foot: Secondary | ICD-10-CM | POA: Insufficient documentation

## 2017-02-24 DIAGNOSIS — M48062 Spinal stenosis, lumbar region with neurogenic claudication: Secondary | ICD-10-CM | POA: Diagnosis not present

## 2017-02-24 DIAGNOSIS — F17210 Nicotine dependence, cigarettes, uncomplicated: Secondary | ICD-10-CM | POA: Insufficient documentation

## 2017-02-24 DIAGNOSIS — R937 Abnormal findings on diagnostic imaging of other parts of musculoskeletal system: Secondary | ICD-10-CM | POA: Diagnosis not present

## 2017-02-24 DIAGNOSIS — M79604 Pain in right leg: Secondary | ICD-10-CM

## 2017-02-24 DIAGNOSIS — M4807 Spinal stenosis, lumbosacral region: Secondary | ICD-10-CM | POA: Diagnosis not present

## 2017-02-24 DIAGNOSIS — M9983 Other biomechanical lesions of lumbar region: Secondary | ICD-10-CM | POA: Diagnosis not present

## 2017-02-24 DIAGNOSIS — M5442 Lumbago with sciatica, left side: Secondary | ICD-10-CM

## 2017-02-24 DIAGNOSIS — D693 Immune thrombocytopenic purpura: Secondary | ICD-10-CM | POA: Insufficient documentation

## 2017-02-24 DIAGNOSIS — Z79899 Other long term (current) drug therapy: Secondary | ICD-10-CM | POA: Diagnosis not present

## 2017-02-24 DIAGNOSIS — M5441 Lumbago with sciatica, right side: Secondary | ICD-10-CM

## 2017-02-24 DIAGNOSIS — F1190 Opioid use, unspecified, uncomplicated: Secondary | ICD-10-CM | POA: Insufficient documentation

## 2017-02-24 DIAGNOSIS — M48061 Spinal stenosis, lumbar region without neurogenic claudication: Secondary | ICD-10-CM | POA: Diagnosis not present

## 2017-02-24 DIAGNOSIS — M5116 Intervertebral disc disorders with radiculopathy, lumbar region: Secondary | ICD-10-CM | POA: Insufficient documentation

## 2017-02-24 DIAGNOSIS — M79605 Pain in left leg: Secondary | ICD-10-CM | POA: Insufficient documentation

## 2017-02-24 DIAGNOSIS — Z79891 Long term (current) use of opiate analgesic: Secondary | ICD-10-CM | POA: Diagnosis not present

## 2017-02-24 DIAGNOSIS — M25572 Pain in left ankle and joints of left foot: Secondary | ICD-10-CM | POA: Diagnosis not present

## 2017-02-24 DIAGNOSIS — G894 Chronic pain syndrome: Secondary | ICD-10-CM | POA: Diagnosis not present

## 2017-02-24 DIAGNOSIS — G8929 Other chronic pain: Secondary | ICD-10-CM

## 2017-02-24 MED ORDER — HYDROCODONE-ACETAMINOPHEN 7.5-325 MG PO TABS: 1.0000 | ORAL_TABLET | Freq: Two times a day (BID) | ORAL | 0 refills | Status: DC

## 2017-02-24 NOTE — Telephone Encounter (Signed)
Called and let patient know RX was ready to be picked up.

## 2017-02-24 NOTE — Telephone Encounter (Signed)
Instead of going to pain management?  No, I cannot do that

## 2017-02-24 NOTE — Addendum Note (Signed)
Addended by: [name] on: 02/24/2017 04:39 PM   Modules accepted: Orders

## 2017-02-24 NOTE — Telephone Encounter (Signed)
Called and spoke to patient. She stated that she saw Dr. Dossie Arbour today (note in chart). Per his OV note, he states that the visit today was just an initial consult and that they have not taken over patient's controlled substance management. Patient called to see if [name] would refill her medication because she states Dr. Dossie Arbour has not yet because he is sending her to have blood work and x-rays. She states she is going back to see him after she has all of this done and then see what he is going to do from there.

## 2017-02-24 NOTE — Telephone Encounter (Signed)
Patient called to see if [name] would give her the script for her pain meds for this month. Per patient she has several things she needs to take care of and several appts   [phone]

## 2017-02-24 NOTE — Telephone Encounter (Signed)
Routing to provider  

## 2017-02-24 NOTE — Telephone Encounter (Signed)
Opened in error

## 2017-02-24 NOTE — Progress Notes (Signed)
Safety precautions to be maintained throughout the outpatient stay will include: orient to surroundings, keep bed in low position, maintain call bell within reach at all times, provide assistance with transfer out of bed and ambulation.  

## 2017-02-24 NOTE — Patient Instructions (Addendum)
You have been insttructed to get labs and xrays done at the [facility]    Steps to Quit Smoking Smoking tobacco can be bad for your health. It can also affect almost every organ in your body. Smoking puts you and people around you at risk for many serious long-lasting (chronic) diseases. Quitting smoking is hard, but it is one of the best things that you can do for your health. It is never too late to quit. What are the benefits of quitting smoking? When you quit smoking, you lower your risk for getting serious diseases and conditions. They can include:  Lung cancer or lung disease.  Heart disease.  Stroke.  Heart attack.  Not being able to have children (infertility).  Weak bones (osteoporosis) and broken bones (fractures). If you have coughing, wheezing, and shortness of breath, those symptoms may get better when you quit. You may also get sick less often. If you are pregnant, quitting smoking can help to lower your chances of having a baby of low birth weight. What can I do to help me quit smoking? Talk with your doctor about what can help you quit smoking. Some things you can do (strategies) include:  Quitting smoking totally, instead of slowly cutting back how much you smoke over a period of time.  Going to in-person counseling. You are more likely to quit if you go to many counseling sessions.  Using resources and support systems, such as:  Online chats with a Social worker.  Phone quitlines.  Printed Furniture conservator/restorer.  Support groups or group counseling.  Text messaging programs.  Mobile phone apps or applications.  Taking medicines. Some of these medicines may have nicotine in them. If you are pregnant or breastfeeding, do not take any medicines to quit smoking unless your doctor says it is okay. Talk with your doctor about counseling or other things that can help you. Talk with your doctor about using more than one strategy at the same time, such as taking  medicines while you are also going to in-person counseling. This can help make quitting easier. What things can I do to make it easier to quit? Quitting smoking might feel very hard at first, but there is a lot that you can do to make it easier. Take these steps:  Talk to your family and friends. Ask them to support and encourage you.  Call phone quitlines, reach out to support groups, or work with a Social worker.  Ask people who smoke to not smoke around you.  Avoid places that make you want (trigger) to smoke, such as:  Bars.  Parties.  Smoke-break areas at work.  Spend time with people who do not smoke.  Lower the stress in your life. Stress can make you want to smoke. Try these things to help your stress:  Getting regular exercise.  Deep-breathing exercises.  Yoga.  Meditating.  Doing a body scan. To do this, close your eyes, focus on one area of your body at a time from head to toe, and notice which parts of your body are tense. Try to relax the muscles in those areas.  Download or buy apps on your mobile phone or tablet that can help you stick to your quit plan. There are many free apps, such as QuitGuide from the [employer] Office manager for Disease Control and Prevention). You can find more support from smokefree.gov and other websites. This information is not intended to replace advice given to you by your health care provider. Make sure you  discuss any questions you have with your health care provider. Document Released: 08/23/2009 Document Revised: 06/24/2016 Document Reviewed: 03/13/2015 Elsevier Interactive Patient Education  2017 [place].  _____________________________________________________________________________________________  Pain Score  Introduction: The pain score used by this practice is the Verbal Numerical Rating Scale (VNRS-11). This is an 11-point scale. It is for adults and children 10 years or older. There are significant differences in how the pain score  is reported, used, and applied. Forget everything you learned in the past and learn this scoring system.  General Information: The scale should reflect your current level of pain. Unless you are specifically asked for the level of your worst pain, or your average pain. If you are asked for one of these two, then it should be understood that it is over the past 24 hours.  Basic Activities of Daily Living (ADL): Personal hygiene, dressing, eating, transferring, and using restroom.  Instructions: Most patients tend to report their level of pain as a combination of two factors, their physical pain and their psychosocial pain. This last one is also known as "suffering" and it is reflection of how physical pain affects you socially and psychologically. From now on, report them separately. From this point on, when asked to report your pain level, report only your physical pain. Use the following table for reference.  Pain Clinic Pain Levels (0-5/10)  Pain Level Score Description  No Pain 0   Mild pain 1 Nagging, annoying, but does not interfere with basic activities of daily living (ADL). Patients are able to eat, bathe, get dressed, toileting (being able to get on and off the toilet and perform personal hygiene functions), transfer (move in and out of bed or a chair without assistance), and maintain continence (able to control bladder and bowel functions). Blood pressure and heart rate are unaffected. A normal heart rate for a healthy adult ranges from 60 to 100 bpm (beats per minute).   Mild to moderate pain 2 Noticeable and distracting. Impossible to hide from other people. More frequent flare-ups. Still possible to adapt and function close to normal. It can be very annoying and may have occasional stronger flare-ups. With discipline, patients may get used to it and adapt.   Moderate pain 3 Interferes significantly with activities of daily living (ADL). It becomes difficult to feed, bathe, get dressed,  get on and off the toilet or to perform personal hygiene functions. Difficult to get in and out of bed or a chair without assistance. Very distracting. With effort, it can be ignored when deeply involved in activities.   Moderately severe pain 4 Impossible to ignore for more than a few minutes. With effort, patients may still be able to manage work or participate in some social activities. Very difficult to concentrate. Signs of autonomic nervous system discharge are evident: dilated pupils (mydriasis); mild sweating (diaphoresis); sleep interference. Heart rate becomes elevated (>115 bpm). Diastolic blood pressure (lower number) rises above 100 mmHg. Patients find relief in laying down and not moving.   Severe pain 5 Intense and extremely unpleasant. Associated with frowning face and frequent crying. Pain overwhelms the senses.  Ability to do any activity or maintain social relationships becomes significantly limited. Conversation becomes difficult. Pacing back and forth is common, as getting into a comfortable position is nearly impossible. Pain wakes you up from deep sleep. Physical signs will be obvious: pupillary dilation; increased sweating; goosebumps; brisk reflexes; cold, clammy hands and feet; nausea, vomiting or dry heaves; loss of appetite; significant sleep disturbance  with inability to fall asleep or to remain asleep. When persistent, significant weight loss is observed due to the complete loss of appetite and sleep deprivation.  Blood pressure and heart rate becomes significantly elevated. Caution: If elevated blood pressure triggers a pounding headache associated with blurred vision, then the patient should immediately seek attention at an urgent or emergency care unit, as these may be signs of an impending stroke.    Emergency Department Pain Levels (6-10/10)  Emergency Room Pain 6 Severely limiting. Requires emergency care and should not be seen or managed at an outpatient pain management  facility. Communication becomes difficult and requires great effort. Assistance to reach the emergency department may be required. Facial flushing and profuse sweating along with potentially dangerous increases in heart rate and blood pressure will be evident.   Distressing pain 7 Self-care is very difficult. Assistance is required to transport, or use restroom. Assistance to reach the emergency department will be required. Tasks requiring coordination, such as bathing and getting dressed become very difficult.   Disabling pain 8 Self-care is no longer possible. At this level, pain is disabling. The individual is unable to do even the most "basic" activities such as walking, eating, bathing, dressing, transferring to a bed, or toileting. Fine motor skills are lost. It is difficult to think clearly.   Incapacitating pain 9 Pain becomes incapacitating. Thought processing is no longer possible. Difficult to remember your own name. Control of movement and coordination are lost.   The worst pain imaginable 10 At this level, most patients pass out from pain. When this level is reached, collapse of the autonomic nervous system occurs, leading to a sudden drop in blood pressure and heart rate. This in turn results in a temporary and dramatic drop in blood flow to the brain, leading to a loss of consciousness. Fainting is one of the body's self defense mechanisms. Passing out puts the brain in a calmed state and causes it to shut down for a while, in order to begin the healing process.    Summary: 1. Refer to this scale when providing [place] with your pain level. 2. Be accurate and careful when reporting your pain level. This will help with your care. 3. Over-reporting your pain level will lead to loss of credibility. 4. Even a level of 1/10 means that there is pain and will be treated at our facility. 5. High, inaccurate reporting will be documented as "Symptom Exaggeration", leading to loss of credibility and  suspicions of possible secondary gains such as obtaining more narcotics, or wanting to appear disabled, for fraudulent reasons. 6. Only pain levels of 5 or below will be seen at our facility. 7. Pain levels of 6 and above will be sent to the Emergency Department and the appointment cancelled. _____________________________________________________________________________________________

## 2017-02-24 NOTE — Progress Notes (Signed)
Patient's Name: Denise Everett  MRN: [id number]  Referring Provider: [name], NP  DOB: May 25, 1951  PCP: [name], NP  DOS: 02/24/2017  Note by: Kathlen Brunswick. Dossie Arbour, MD  Service setting: Ambulatory outpatient  Specialty: Interventional Pain Management  Location: [facility] (AMB) Pain Management Facility    Patient type: New Patient   Primary Reason(s) for Visit: Initial Patient Evaluation CC: Back Pain (low)  HPI  Denise Everett is a 66 y.o. year old, female patient, who comes today for an initial evaluation. She has Lumbar IVDD (intervertebral disc displacement); Neuritis or radiculitis due to rupture of lumbar intervertebral disc; Schizophrenia ([place]); Nervous breakdown; Osteoporosis; CKD (chronic kidney disease), stage III; Tobacco use disorder; Chronic low back pain (Location of Primary Source of Pain) (Bilateral) (R>L); Idiopathic thrombocytopenic purpura ([place]); Allergic rhinitis; Hypertensive CKD (chronic kidney disease); Chronic lower extremity pain (Location of Secondary source of pain) (Bilateral) (L>R); Metabolic acidosis; Hyperlipidemia; Hypertension; MGUS (monoclonal gammopathy of unknown significance); Advance care planning; Personal history of tobacco use, presenting hazards to health; Chronic pain syndrome; Long term (current) use of opiate analgesic; Long term prescription opiate use; Opiate use; Chronic ankle pain  (Location of Tertiary source of pain) (Bilateral) (R>L); Chronic Lumbar radiculitis; Lumbar HNP (herniated nucleus pulposus); Abnormal MRI, lumbar spine (06/22/2014); Lumbar central spinal stenosis (Severe at L3-4); Lumbar foraminal stenosis (Severe: Left L3-4; Right L4-5; Bilateral L5-S1); and Lumbar lateral recess stenosis (Severe: Left L3-4; Right L4-5; Left L5-S1) on her problem list.. Her primarily concern today is the Back Pain (low)  Pain Assessment: Self-Reported Pain Score: 10-Worst pain ever/10 Clinically the patient looks like a 2/10 Reported level is inconsistent  with clinical observations. Information on the proper use of the pain scale provided to the patient today Pain Type: Chronic pain Pain Location: Back Pain Orientation: Right, Left, Lower Pain Descriptors / Indicators: Aching, Cramping Pain Frequency: Constant  Onset and Duration: Present longer than 3 months Cause of pain: Arthritis Severity: No change since onset, NAS-11 at its worse: 10/10 and NAS-11 on the average: 10/10 Timing: Afternoon and Night Aggravating Factors: Kneeling and Walking uphill Alleviating Factors: Biofeedback, Medications, Resting and Walking Associated Problems: Pain that wakes patient up and Pain that does not allow patient to sleep Quality of Pain: Aching, Sharp and Stabbing Previous Examinations or Tests: The patient denies Any biopsies, bone scans, CPT, CT scans, CT myelograms, discograms, nerve conduction tests, EMG/PNCV, endoscopies, epidurograms, MRI scans, myelograms, nerve blocks, spinal taps, x-rays, neurological evaluations, neurosurgical evaluation, orthopedic evaluations, chiropractic evaluation, or psychiatric evaluations. Previous Treatments: Trigger point injections  The patient comes into the clinics today for the first time for a chronic pain management evaluation. According to the patient the worst pain is that of the lower back with the right side being worst on the left. She denies any prior surgeries, nerve blocks, physical therapy, or recent x-rays or MRIs. Following this is the lower extremity pain with the left being worst on the right. In the case of the left lower extremity the patient denies any prior surgeries, nerve blocks, or recent x-rays but she does admit to having had some physical therapy at the [facility] around 2015. The pain goes down to the ankle and it causes cramps. The pain travels all the way from the back to the ankle through the posterior aspect of the leg. His of the right lower extremity the patient admits to having had 2  prior ankle surgeries by an orthopedic surgeon 1998 when she fractured the ankle. He denies any  nerve blocks, physical therapy, recent x-rays. In addition to this, the patient complains of bilateral ankle pain with the right being worse than the left. The case of the right ankle this is secondary to the fractures that she suffered around 1998 but she indicates that the pain did not start until approximately 5 years ago. The distribution of the pain is that in the inner portion of the ankle following what appears to be an L4 dermatomal distribution. In the case of the left ankle the pain also follows an L4 dermatomal distribution and it is described to be associated with cramping, primarily at night but also present occasionally during the day.  The patient indicates that she normally controls the pain with hydrocodone/APAP 7.5/325. According to the [facility] her last prescription was written on 02/06/2017 by [name], FNP and was filled on 02/06/2017. The prescription was for hydrocodone/APAP 7.03/12/24/2028 for a period of 14 days. With this would suggest that the prescription was written to be used one tablet by mouth twice a day this represents a 15 MME/Day.  Today I took the time to provide the patient with information regarding my pain practice. The patient was informed that my practice is divided into two sections: an interventional pain management section, as well as a completely separate and distinct medication management section. I explained that I have procedure days for my interventional therapies, and evaluation days for follow-ups and medication management. Because of the amount of documentation required during both, they are kept separated. This means that there is the possibility that she may be scheduled for a procedure on one day, and medication management the next. I have also informed her that because of staffing and facility limitations, I no  longer take patients for medication management only. To illustrate the reasons for this, I gave the patient the example of surgeons, and how inappropriate it would be to refer a patient to his/her care, just to write for the post-surgical antibiotics on a surgery done by a different surgeon.   Because interventional pain management is my board-certified specialty, the patient was informed that joining my practice means that they are open to any and all interventional therapies. I made it clear that this does not mean that they will be forced to have any procedures done. What this means is that I believe interventional therapies to be essential part of the diagnosis and proper management of chronic pain conditions. Therefore, patients not interested in these interventional alternatives will be better served under the care of a different practitioner.  The patient was also made aware of my Comprehensive Pain Management Safety Guidelines where by joining my practice, they limit all of their nerve blocks and joint injections to those done by our practice, for as long as we are retained to manage their care.   Historic Controlled Substance Pharmacotherapy Review  PMP and historical list of controlled substances: Tramadol 50 mg 2 tablets by mouth every 6 hours; hydrocodone/APAP 7.5/325 one tablet by mouth twice a day Highest analgesic regimen found: Tramadol 50 mg 2 tablets by mouth every 6 hours Most recent analgesic: Hydrocodone/APAP 7.5/325 to the pill twice a day Highest recorded MME/day: 38.46 mg/day MME/day: 15 mg/day Medications: The patient did not bring the medication(s) to the appointment, as requested in our "New Patient Package" Pharmacodynamics: Desired effects: Analgesia: The patient reports >50% benefit. Reported improvement in function: The patient reports medication allows her to accomplish basic ADLs. Clinically meaningful improvement in function (CMIF):  Sustained CMIF goals  met Perceived effectiveness: Described as relatively effective, allowing for increase in activities of daily living (ADL) Undesirable effects: Side-effects or Adverse reactions: None reported Historical Monitoring: The patient  reports that she does not use drugs.. List of all UDS Test(s): No results found for: MDMA, COCAINSCRNUR, PCPSCRNUR, PCPQUANT, CANNABQUANT, THCU, [place] List of all Serum Drug Screening Test(s):  No results found for: AMPHSCRSER, BARBSCRSER, BENZOSCRSER, COCAINSCRSER, PCPSCRSER, PCPQUANT, THCSCRSER, CANNABQUANT, OPIATESCRSER, OXYSCRSER, PROPOXSCRSER Historical Background Evaluation: [place] PDMP: Six (6) year initial data search conducted.             [place] Department of public safety, offender search: Editor, commissioning Information) Non-contributory Risk Assessment Profile: Aberrant behavior: None observed or detected today Risk factors for fatal opioid overdose: None identified today Fatal overdose hazard ratio (HR): Calculation deferred Non-fatal overdose hazard ratio (HR): Calculation deferred Risk of opioid abuse or dependence: 0.7-3.0% with doses ? 36 MME/day and 6.1-26% with doses ? 120 MME/day. Substance use disorder (SUD) risk level: Pending results of Medical Psychology Evaluation for SUD Opioid risk tool (ORT) (Total Score): 6  ORT Scoring interpretation table:  Score <3 = Low Risk for SUD  Score between 4-7 = Moderate Risk for SUD  Score >8 = High Risk for Opioid Abuse   PHQ-2 Depression Scale:  Total score: 0  PHQ-2 Scoring interpretation table: (Score and probability of major depressive disorder)  Score 0 = No depression  Score 1 = 15.4% Probability  Score 2 = 21.1% Probability  Score 3 = 38.4% Probability  Score 4 = 45.5% Probability  Score 5 = 56.4% Probability  Score 6 = 78.6% Probability   PHQ-9 Depression Scale:  Total score: 0  PHQ-9 Scoring interpretation table:  Score 0-4 = No depression  Score 5-9 = Mild depression  Score 10-14 = Moderate depression   Score 15-19 = Moderately severe depression  Score 20-27 = Severe depression (2.4 times higher risk of SUD and 2.89 times higher risk of overuse)   Pharmacologic Plan: Pending ordered tests and/or consults  Meds  The patient has a current medication list which includes the following prescription(s): atorvastatin, hydrocodone-acetaminophen, lisinopril, raloxifene, and risperidone.  Current Outpatient Prescriptions on File Prior to Visit  Medication Sig  . atorvastatin (LIPITOR) 10 MG tablet Take 1 tablet (10 mg total) by mouth daily at 6 PM.  . HYDROcodone-acetaminophen (NORCO) 7.5-325 MG tablet Take 1 tablet by mouth 2 (two) times daily.  Marland Kitchen lisinopril (PRINIVIL,ZESTRIL) 10 MG tablet TAKE ONE TABLET BY MOUTH DAILY  . raloxifene (EVISTA) 60 MG tablet Take 1 tablet (60 mg total) by mouth daily.  . risperiDONE (RISPERDAL) 2 MG tablet Take 0.5-1 tablets (1-2 mg total) by mouth at bedtime.   No current facility-administered medications on file prior to visit.    Imaging Review  Lumbosacral Imaging: Lumbar MR wo contrast:  Results for orders placed in visit on 06/22/14  MR L Spine Ltd W/O Cm   Narrative * PRIOR REPORT IMPORTED FROM AN EXTERNAL SYSTEM *   CLINICAL DATA:  66 year old female with low back pain radiating to  both lower extremities to the knees. Multiple falls. Initial  encounter.   EXAM:  MRI LUMBAR SPINE WITHOUT CONTRAST   TECHNIQUE:  Multiplanar, multisequence MR imaging of the lumbar spine was  performed. No intravenous contrast was administered.   COMPARISON:  Lumbar spine radiograph 04/26/2014. CT Abdomen and  Pelvis 03/22/2013.   FINDINGS:  Normal lumbar segmentation demonstrated on comparisons. Vertebral  height and alignment appears stable. Mild marrow  edema in the L3 and  L4 vertebrae primarily along the endplates appears to be  degenerative in nature. See additional degenerative findings at that  level below. Superimposed scattered chronic endplate Schmorl  nodes.  No acute osseous abnormality identified.   Visualized lower thoracic spinal cord is normal with conus medularis  at L2.   Stable visualized abdominal viscera. Visualized posterior paraspinal  soft tissues are within normal limits.   T10-T11: Partially visible disc bulge. No definite spinal stenosis.   T11-T12: Disc bulge and mild facet hypertrophy without significant  stenosis.   T12-L1:  Negative.   L1-L2: Disc bulge with subtle superimposed left paracentral disc  protrusion best seen on series 5, image 9. Slight cephalad migration  of disc as seen on series 2, image 9. Mild left lateral recess  stenosis. No significant overall spinal stenosis.   L2-L3: Mild disc bulge. Mild to moderate facet hypertrophy. Overall  mild spinal stenosis.   L3-L4: Bulky left far lateral eccentric disc osteophyte complex.  Superimposed central disc extrusion best seen on series 2, image 8.  Superimposed moderate to severe facet hypertrophy. Trace facet joint  fluid.   Combined there is severe left L3 foraminal stenosis, severe left  lateral recess stenosis, and moderate to severe spinal stenosis.   L4-L5: Vacuum disc phenomena. Bulky right eccentric circumferential  disc osteophyte complex. Superimposed right paracentral  -subarticular disc extrusion best seen on series 2, image 7 and  series 5, image 29. Superimposed moderate facet hypertrophy with  trace facet joint fluid.   Combined there is severe right lateral recess stenosis and right L4  foraminal stenosis. No significant overall spinal stenosis.   L5-S1: Severe disc space loss and vacuum disc phenomena. Bulky  circumferential disc osteophyte complex. Superimposed left  paracentral caudal disc extrusion best seen on series 2, image 9 and  series 5, images 34 and 35. No definite sequestered disc fragment.  Superimposed mild facet and ligament flavum hypertrophy.   Combined there is severe left lateral recess stenosis. There  is  moderate to severe bilateral L5 foraminal stenosis primarily due to  endplate spurring. No significant spinal stenosis.   IMPRESSION:  1. Advanced disc and endplate degeneration S9-Q3 through L5-S1.  Multifactorial moderate to severe spinal stenosis at L3-L4 plus  severe lateral recess stenosis on the left at L3-L4, on the right at  L4-L5, and on the left at L5-S1.  2. Associated severe neural foraminal stenosis on the left at L3-L4,  on the right at L4-L5, and bilaterally at L5-S1.  3. Small disc herniation at L1-L2 resulting in mild left lateral  recess stenosis at that level.    Electronically Signed    By: Lars Pinks M.D.    On: 06/22/2014 10:47       Lumbar DG (Complete) 4+V:  Results for orders placed in visit on 04/26/14  DG Lumbar Spine Complete   Narrative * PRIOR REPORT IMPORTED FROM AN EXTERNAL SYSTEM *   CLINICAL DATA:  Leg pain and numbness.   EXAM:  LUMBAR SPINE - COMPLETE 4+ VIEW   COMPARISON:  None.   FINDINGS:  No evidence for fracture. Marked loss of disc height with endplate  degeneration is seen at L4-5 and L5-S1. There is also loss of disc  height at L3-4. Lower lumbar facet degeneration is associated. SI  joints are unremarkable. Course calcification over the central  anatomic pelvis is compatible with fibroids.   IMPRESSION:  Lower lumbar degenerative changes.    Electronically Signed  By: Misty Stanley M.D.    On: 04/26/2014 13:37       Note: Available results from prior imaging studies were reviewed.        ROS  Cardiovascular History: Hypertension Pulmonary or Respiratory History: Smoker Neurological History: Negative for epilepsy, stroke, urinary or fecal inontinence, spina bifida or tethered cord syndrome Review of Past Neurological Studies: No results found for this or any previous visit. Psychological-Psychiatric History: Psychiatric disorder  Gastrointestinal History: Negative for peptic ulcer disease, hiatal hernia, GERD,  IBS, hepatitis, cirrhosis or pancreatitis Genitourinary History: Negative for nephrolithiasis, hematuria, renal failure or chronic kidney disease Hematological History: Negative for anticoagulant therapy, anemia, bruising or bleeding easily, hemophilia, sickle cell disease or trait, thrombocytopenia or coagulupathies Endocrine History: Negative for diabetes or thyroid disease Rheumatologic History: Negative for lupus, osteoarthritis, rheumatoid arthritis, myositis, polymyositis or fibromyagia Musculoskeletal History: Negative for myasthenia gravis, muscular dystrophy, multiple sclerosis or malignant hyperthermia Work History: Retired  Allergies  Ms. Semidey has No Known Allergies.  Laboratory Chemistry  Inflammation Markers No results found for: CRP, ESRSEDRATE (CRP: Acute Phase) (ESR: Chronic Phase) Renal Function Markers Lab Results  Component Value Date   BUN 30 (H) 10/08/2016   CREATININE 1.49 (H) 10/08/2016   GFRAA 42 (L) 10/08/2016   GFRNONAA 36 (L) 10/08/2016   Hepatic Function Markers Lab Results  Component Value Date   AST 10 10/08/2016   ALT 5 10/08/2016   ALBUMIN 4.5 10/08/2016   ALKPHOS 63 10/08/2016   Electrolytes Lab Results  Component Value Date   NA 133 (L) 10/08/2016   K 5.4 (H) 10/08/2016   CL 99 10/08/2016   CALCIUM 9.2 10/08/2016   Neuropathy Markers No results found for: [id number] Bone Pathology Markers Lab Results  Component Value Date   ALKPHOS 63 10/08/2016   CALCIUM 9.2 10/08/2016   Coagulation Parameters Lab Results  Component Value Date   PLT 60 (L) 01/14/2017   Cardiovascular Markers Lab Results  Component Value Date   HGB 11.6 (L) 01/14/2017   HCT 33.9 (L) 01/14/2017   Note: Lab results reviewed.  [place]  Drug: Ms. Bertoni  reports that she does not use drugs. Alcohol:  reports that she does not drink alcohol. Tobacco:  reports that she has been smoking Cigarettes.  She has a 43.00 pack-year smoking history. She has quit using  smokeless tobacco. Her smokeless tobacco use included Snuff. Medical:  has a past medical history of Abnormal weight loss (06/07/2015); Allergy; Cataract; Chronic kidney disease; Chronic pain syndrome; Hyperlipidemia; Hypertension; Leg pain; Osteoporosis; Renal insufficiency; Schizophrenia ([place]); Thrombocytopenia ([place]); and Tobacco use disorder. Family: family history includes Hyperlipidemia in her mother; Hypertension in her mother.  Past Surgical History:  Procedure Laterality Date  . ankle surgery Right    x's 2  . CATARACT EXTRACTION     Active Ambulatory Problems    Diagnosis Date Noted  . Lumbar IVDD (intervertebral disc displacement) 09/29/2014  . Neuritis or radiculitis due to rupture of lumbar intervertebral disc 09/29/2014  . Schizophrenia ([place]) 06/07/2015  . Nervous breakdown 06/07/2015  . Osteoporosis 06/07/2015  . CKD (chronic kidney disease), stage III 06/07/2015  . Tobacco use disorder 06/07/2015  . Chronic low back pain (Location of Primary Source of Pain) (Bilateral) (R>L) 06/07/2015  . Idiopathic thrombocytopenic purpura ([place]) 06/07/2015  . Allergic rhinitis 06/07/2015  . Hypertensive CKD (chronic kidney disease) 06/07/2015  . Chronic lower extremity pain (Location of Secondary source of pain) (Bilateral) (L>R) 06/07/2015  . Metabolic acidosis 94/49/6759  . Hyperlipidemia 06/07/2015  .  Hypertension 06/07/2015  . MGUS (monoclonal gammopathy of unknown significance) 06/23/2016  . Advance care planning 10/08/2016  . Personal history of tobacco use, presenting hazards to health 02/03/2017  . Chronic pain syndrome 02/24/2017  . Long term (current) use of opiate analgesic 02/24/2017  . Long term prescription opiate use 02/24/2017  . Opiate use 02/24/2017  . Chronic ankle pain  (Location of Tertiary source of pain) (Bilateral) (R>L) 02/24/2017  . Chronic Lumbar radiculitis 09/29/2014  . Lumbar HNP (herniated nucleus pulposus) 09/29/2014  . Abnormal MRI, lumbar spine  (06/22/2014) 02/24/2017  . Lumbar central spinal stenosis (Severe at L3-4) 02/24/2017  . Lumbar foraminal stenosis (Severe: Left L3-4; Right L4-5; Bilateral L5-S1) 02/24/2017  . Lumbar lateral recess stenosis (Severe: Left L3-4; Right L4-5; Left L5-S1) 02/24/2017   Resolved Ambulatory Problems    Diagnosis Date Noted  . Abnormal weight loss 06/07/2015   Past Medical History:  Diagnosis Date  . Abnormal weight loss 06/07/2015  . Allergy   . Cataract   . Chronic kidney disease   . Chronic pain syndrome   . Hyperlipidemia   . Hypertension   . Leg pain   . Osteoporosis   . Renal insufficiency   . Schizophrenia ([place])   . Thrombocytopenia ([place])   . Tobacco use disorder    Constitutional Exam  General appearance: Well nourished, well developed, and well hydrated. In no apparent acute distress Vitals:   02/24/17 1324  BP: 132/79  Pulse: 99  Temp: 97.4 F (36.3 C)  SpO2: 100%  Weight: 140 lb (63.5 kg)  Height: '5\' 6"'  (1.676 m)   BMI Assessment: Estimated body mass index is 22.6 kg/m as calculated from the following:   Height as of this encounter: '5\' 6"'  (1.676 m).   Weight as of this encounter: 140 lb (63.5 kg).  BMI interpretation table: BMI level Category Range association with higher incidence of chronic pain  <18 kg/m2 Underweight   18.5-24.9 kg/m2 Ideal body weight   25-29.9 kg/m2 Overweight Increased incidence by 20%  30-34.9 kg/m2 Obese (Class I) Increased incidence by 68%  35-39.9 kg/m2 Severe obesity (Class II) Increased incidence by 136%  >40 kg/m2 Extreme obesity (Class III) Increased incidence by 254%   BMI Readings from Last 4 Encounters:  02/24/17 22.60 kg/m  02/03/17 25.65 kg/m  01/14/17 25.74 kg/m  01/06/17 25.65 kg/m   Wt Readings from Last 4 Encounters:  02/24/17 140 lb (63.5 kg)  02/03/17 138 lb (62.6 kg)  01/14/17 138 lb 7.2 oz (62.8 kg)  01/06/17 138 lb (62.6 kg)  Psych/Mental status: Alert, oriented x 3 (person, place, & time)       Eyes:  PERLA Respiratory: No evidence of acute respiratory distress  Cervical Spine Exam  Inspection: No masses, redness, or swelling Alignment: Symmetrical Functional ROM: Unrestricted ROM Stability: No instability detected Muscle strength & Tone: Functionally intact Sensory: Unimpaired Palpation: No palpable anomalies  Upper Extremity (UE) Exam    Side: Right upper extremity  Side: Left upper extremity  Inspection: No masses, redness, swelling, or asymmetry. No contractures  Inspection: No masses, redness, swelling, or asymmetry. No contractures  Functional ROM: Unrestricted ROM          Functional ROM: Unrestricted ROM          Muscle strength & Tone: Functionally intact  Muscle strength & Tone: Functionally intact  Sensory: Unimpaired  Sensory: Unimpaired  Palpation: No palpable anomalies  Palpation: No palpable anomalies  Specialized Test(s): Deferred  Specialized Test(s): Deferred          Thoracic Spine Exam  Inspection: No masses, redness, or swelling Alignment: Symmetrical Functional ROM: Unrestricted ROM Stability: No instability detected Sensory: Unimpaired Muscle strength & Tone: No palpable anomalies  Lumbar Spine Exam  Inspection: No masses, redness, or swelling Alignment: Symmetrical Functional ROM: Unrestricted ROM Stability: No instability detected Muscle strength & Tone: Functionally intact Sensory: Unimpaired Palpation: No palpable anomalies Provocative Tests: Lumbar Hyperextension and rotation test: evaluation deferred today       Patrick's Maneuver: evaluation deferred today              Gait & Posture Assessment  Ambulation: Patient ambulates using a cane Gait: Limited. Using assistive device to ambulate Posture: Antalgic   Lower Extremity Exam    Side: Right lower extremity  Side: Left lower extremity  Inspection: No masses, redness, swelling, or asymmetry. No contractures  Inspection: No masses, redness, swelling, or asymmetry. No contractures    Functional ROM: Unrestricted ROM          Functional ROM: Unrestricted ROM          Muscle strength & Tone: Functionally intact  Muscle strength & Tone: Functionally intact  Sensory: Unimpaired  Sensory: Unimpaired  Palpation: No palpable anomalies  Palpation: No palpable anomalies   Assessment  Primary Diagnosis & Pertinent Problem List: The primary encounter diagnosis was Chronic bilateral low back pain with bilateral sciatica. Diagnoses of Chronic lower extremity pain (Location of Secondary source of pain) (Bilateral) (L>R), Chronic ankle pain  (Location of Tertiary source of pain) (Bilateral) (R>L), Lumbar spinal stenosis, Lumbar foraminal stenosis, Lumbar lateral recess stenosis (severe: Left L3-4; Right L4-5; Left L5-S1), Abnormal MRI, lumbar spine (06/22/2014), Chronic pain syndrome, Idiopathic thrombocytopenic purpura ([place]), Long term (current) use of opiate analgesic, Long term prescription opiate use, and Opiate use were also pertinent to this visit.  Visit Diagnosis: 1. Chronic bilateral low back pain with bilateral sciatica   2. Chronic lower extremity pain (Location of Secondary source of pain) (Bilateral) (L>R)   3. Chronic ankle pain  (Location of Tertiary source of pain) (Bilateral) (R>L)   4. Lumbar spinal stenosis   5. Lumbar foraminal stenosis   6. Lumbar lateral recess stenosis (severe: Left L3-4; Right L4-5; Left L5-S1)   7. Abnormal MRI, lumbar spine (06/22/2014)   8. Chronic pain syndrome   9. Idiopathic thrombocytopenic purpura ([place])   10. Long term (current) use of opiate analgesic   11. Long term prescription opiate use   12. Opiate use    Plan of Care  Initial treatment plan:  Please be advised that as per protocol, today's visit has been an evaluation only. We have not taken over the patient's controlled substance management.  Problem-specific plan: No problem-specific Assessment & Plan notes found for this encounter.  Ordered Lab-work, Procedure(s),  Referral(s), & Consult(s): Orders Placed This Encounter  Procedures  . DG Lumbar Spine Complete W/Bend  . Compliance Drug Analysis, Ur  . C-reactive protein  . Magnesium  . Comprehensive metabolic panel  . Sedimentation rate  . Vitamin B12  . 25-Hydroxyvitamin D Lcms D2+D3  . Platelet function assay  . Platelet count  . Ambulatory referral to Psychology   Pharmacotherapy: Medications ordered:  No orders of the defined types were placed in this encounter.  Medications administered during this visit: Ms. Givhan had no medications administered during this visit.   Pharmacotherapy under consideration:  Opioid Analgesics: The patient was informed that there is no guarantee that  she would be a candidate for opioid analgesics. The decision will be made following CDC guidelines. This decision will be based on the results of diagnostic studies, as well as Ms. Kaley's risk profile.  Membrane stabilizer: To be determined at a later time Muscle relaxant: To be determined at a later time NSAID: To be determined at a later time Other analgesic(s): To be determined at a later time   Interventional therapies under consideration: Ms. Savoia was informed that there is no guarantee that she would be a candidate for interventional therapies. The decision will be based on the results of diagnostic studies, as well as Ms. Okonski's risk profile.  Possible procedure(s): Diagnostic bilateral lumbar facet block  Possible bilateral lumbar facet RFA  Diagnostic L3-4 translaminar lumbar epidural steroid injection  Diagnostic left-sided L3-4 transforaminal epidural steroid injection  Diagnostic right-sided L4-5 transforaminal epidural steroid injection  Diagnostic bilateral L5-S1 transforaminal epidural steroid injection    Provider-requested follow-up: Return for 2nd Visit, after [facility] evaluation.  Future Appointments Date Time Provider [place]  07/22/2017 2:00 PM [facility] None    07/22/2017 2:15 PM Lloyd Huger, MD [facility] None  09/23/2017 10:00 AM [name], NP [facility] None    Primary Care Physician: [name], NP Location: [facility] Note by: Kathlen Brunswick. Dossie Arbour, M.D, DABA, DABAPM, DABPM, DABIPP, FIPP Date: 02/24/2017; Time: 3:53 PM  Pain Score Disclaimer: We use the NRS-11 scale. This is a self-reported, subjective measurement of pain severity with only modest accuracy. It is used primarily to identify changes within a particular patient. It must be understood that outpatient pain scales are significantly less accurate that those used for research, where they can be applied under ideal controlled circumstances with minimal exposure to variables. In reality, the score is likely to be a combination of pain intensity and pain affect, where pain affect describes the degree of emotional arousal or changes in action readiness caused by the sensory experience of pain. Factors such as social and work situation, setting, emotional state, anxiety levels, expectation, and prior pain experience may influence pain perception and show large inter-individual differences that may also be affected by time variables.  Patient instructions provided during this appointment: Patient Instructions   You have been insttructed to get labs and xrays done at the medical mall    Steps to Quit Smoking Smoking tobacco can be bad for your health. It can also affect almost every organ in your body. Smoking puts you and people around you at risk for many serious long-lasting (chronic) diseases. Quitting smoking is hard, but it is one of the best things that you can do for your health. It is never too late to quit. What are the benefits of quitting smoking? When you quit smoking, you lower your risk for getting serious diseases and conditions. They can include:  Lung cancer or lung disease.  Heart disease.  Stroke.  Heart attack.  Not being  able to have children (infertility).  Weak bones (osteoporosis) and broken bones (fractures). If you have coughing, wheezing, and shortness of breath, those symptoms may get better when you quit. You may also get sick less often. If you are pregnant, quitting smoking can help to lower your chances of having a baby of low birth weight. What can I do to help me quit smoking? Talk with your doctor about what can help you quit smoking. Some things you can do (strategies) include:  Quitting smoking totally, instead of slowly cutting back how  much you smoke over a period of time.  Going to in-person counseling. You are more likely to quit if you go to many counseling sessions.  Using resources and support systems, such as:  Online chats with a Social worker.  Phone quitlines.  Printed Furniture conservator/restorer.  Support groups or group counseling.  Text messaging programs.  Mobile phone apps or applications.  Taking medicines. Some of these medicines may have nicotine in them. If you are pregnant or breastfeeding, do not take any medicines to quit smoking unless your doctor says it is okay. Talk with your doctor about counseling or other things that can help you. Talk with your doctor about using more than one strategy at the same time, such as taking medicines while you are also going to in-person counseling. This can help make quitting easier. What things can I do to make it easier to quit? Quitting smoking might feel very hard at first, but there is a lot that you can do to make it easier. Take these steps:  Talk to your family and friends. Ask them to support and encourage you.  Call phone quitlines, reach out to support groups, or work with a Social worker.  Ask people who smoke to not smoke around you.  Avoid places that make you want (trigger) to smoke, such as:  Bars.  Parties.  Smoke-break areas at work.  Spend time with people who do not smoke.  Lower the stress in your life.  Stress can make you want to smoke. Try these things to help your stress:  Getting regular exercise.  Deep-breathing exercises.  Yoga.  Meditating.  Doing a body scan. To do this, close your eyes, focus on one area of your body at a time from head to toe, and notice which parts of your body are tense. Try to relax the muscles in those areas.  Download or buy apps on your mobile phone or tablet that can help you stick to your quit plan. There are many free apps, such as QuitGuide from the [employer] Office manager for Disease Control and Prevention). You can find more support from smokefree.gov and other websites. This information is not intended to replace advice given to you by your health care provider. Make sure you discuss any questions you have with your health care provider. Document Released: 08/23/2009 Document Revised: 06/24/2016 Document Reviewed: 03/13/2015 Elsevier Interactive Patient Education  2017 [place].  _____________________________________________________________________________________________  Pain Score  Introduction: The pain score used by this practice is the Verbal Numerical Rating Scale (VNRS-11). This is an 11-point scale. It is for adults and children 10 years or older. There are significant differences in how the pain score is reported, used, and applied. Forget everything you learned in the past and learn this scoring system.  General Information: The scale should reflect your current level of pain. Unless you are specifically asked for the level of your worst pain, or your average pain. If you are asked for one of these two, then it should be understood that it is over the past 24 hours.  Basic Activities of Daily Living (ADL): Personal hygiene, dressing, eating, transferring, and using restroom.  Instructions: Most patients tend to report their level of pain as a combination of two factors, their physical pain and their psychosocial pain. This last one is also  known as "suffering" and it is reflection of how physical pain affects you socially and psychologically. From now on, report them separately. From this point on, when asked to report your  pain level, report only your physical pain. Use the following table for reference.  Pain Clinic Pain Levels (0-5/10)  Pain Level Score Description  No Pain 0   Mild pain 1 Nagging, annoying, but does not interfere with basic activities of daily living (ADL). Patients are able to eat, bathe, get dressed, toileting (being able to get on and off the toilet and perform personal hygiene functions), transfer (move in and out of bed or a chair without assistance), and maintain continence (able to control bladder and bowel functions). Blood pressure and heart rate are unaffected. A normal heart rate for a healthy adult ranges from 60 to 100 bpm (beats per minute).   Mild to moderate pain 2 Noticeable and distracting. Impossible to hide from other people. More frequent flare-ups. Still possible to adapt and function close to normal. It can be very annoying and may have occasional stronger flare-ups. With discipline, patients may get used to it and adapt.   Moderate pain 3 Interferes significantly with activities of daily living (ADL). It becomes difficult to feed, bathe, get dressed, get on and off the toilet or to perform personal hygiene functions. Difficult to get in and out of bed or a chair without assistance. Very distracting. With effort, it can be ignored when deeply involved in activities.   Moderately severe pain 4 Impossible to ignore for more than a few minutes. With effort, patients may still be able to manage work or participate in some social activities. Very difficult to concentrate. Signs of autonomic nervous system discharge are evident: dilated pupils (mydriasis); mild sweating (diaphoresis); sleep interference. Heart rate becomes elevated (>115 bpm). Diastolic blood pressure (lower number) rises above 100  mmHg. Patients find relief in laying down and not moving.   Severe pain 5 Intense and extremely unpleasant. Associated with frowning face and frequent crying. Pain overwhelms the senses.  Ability to do any activity or maintain social relationships becomes significantly limited. Conversation becomes difficult. Pacing back and forth is common, as getting into a comfortable position is nearly impossible. Pain wakes you up from deep sleep. Physical signs will be obvious: pupillary dilation; increased sweating; goosebumps; brisk reflexes; cold, clammy hands and feet; nausea, vomiting or dry heaves; loss of appetite; significant sleep disturbance with inability to fall asleep or to remain asleep. When persistent, significant weight loss is observed due to the complete loss of appetite and sleep deprivation.  Blood pressure and heart rate becomes significantly elevated. Caution: If elevated blood pressure triggers a pounding headache associated with blurred vision, then the patient should immediately seek attention at an urgent or emergency care unit, as these may be signs of an impending stroke.    Emergency Department Pain Levels (6-10/10)  Emergency Room Pain 6 Severely limiting. Requires emergency care and should not be seen or managed at an outpatient pain management facility. Communication becomes difficult and requires great effort. Assistance to reach the emergency department may be required. Facial flushing and profuse sweating along with potentially dangerous increases in heart rate and blood pressure will be evident.   Distressing pain 7 Self-care is very difficult. Assistance is required to transport, or use restroom. Assistance to reach the emergency department will be required. Tasks requiring coordination, such as bathing and getting dressed become very difficult.   Disabling pain 8 Self-care is no longer possible. At this level, pain is disabling. The individual is unable to do even the most  "basic" activities such as walking, eating, bathing, dressing, transferring to a bed, or toileting.  Fine motor skills are lost. It is difficult to think clearly.   Incapacitating pain 9 Pain becomes incapacitating. Thought processing is no longer possible. Difficult to remember your own name. Control of movement and coordination are lost.   The worst pain imaginable 10 At this level, most patients pass out from pain. When this level is reached, collapse of the autonomic nervous system occurs, leading to a sudden drop in blood pressure and heart rate. This in turn results in a temporary and dramatic drop in blood flow to the brain, leading to a loss of consciousness. Fainting is one of the body's self defense mechanisms. Passing out puts the brain in a calmed state and causes it to shut down for a while, in order to begin the healing process.    Summary: 1. Refer to this scale when providing [place] with your pain level. 2. Be accurate and careful when reporting your pain level. This will help with your care. 3. Over-reporting your pain level will lead to loss of credibility. 4. Even a level of 1/10 means that there is pain and will be treated at our facility. 5. High, inaccurate reporting will be documented as "Symptom Exaggeration", leading to loss of credibility and suspicions of possible secondary gains such as obtaining more narcotics, or wanting to appear disabled, for fraudulent reasons. 6. Only pain levels of 5 or below will be seen at our facility. 7. Pain levels of 6 and above will be sent to the Emergency Department and the appointment cancelled. _____________________________________________________________________________________________

## 2017-03-02 ENCOUNTER — Telehealth: Payer: Self-pay

## 2017-03-02 NOTE — Telephone Encounter (Signed)
Dr Dossie Arbour put in orders for this pt to have lab work completed before she can schedule her second visit appointment. The orders are due by 4/24 pt was going today 4/23 but she is sick with the flu. Pt would like the orders to be extended for two extra weeks so she can guarantee that she can go. Pt also has to give her transportation 3 day notice. Please send me a message when this is completed so that I can contact pt to let her know because she is worried about her next appointment

## 2017-03-03 ENCOUNTER — Encounter: Payer: Self-pay | Admitting: Pain Medicine

## 2017-03-03 DIAGNOSIS — F1290 Cannabis use, unspecified, uncomplicated: Secondary | ICD-10-CM | POA: Insufficient documentation

## 2017-03-03 LAB — COMPLIANCE DRUG ANALYSIS, UR

## 2017-03-03 NOTE — Progress Notes (Signed)
NOTE: This forensic urine drug screen (UDS) test was conducted using a state-of-the-art ultra high performance liquid chromatography and mass spectrometry system (UPLC/MS-MS), the most sophisticated and accurate method available. UPLC/MS-MS is 1,000 times more precise and accurate than standard gas chromatography and mass spectrometry (GC/MS). This system can analyze 26 drug categories and 180 drug compounds.  Positive, undisclosed use of illicit substance (Cannabinoids). Our program has a "Zero Tolerance" for the use of illicit substances. As a consequence of the results obtained on this test, we will no longer offer controlled substances as a therapeutic option for this patient, until the UDS return clear. In addition, we will check to see if this is a recurrent event. Today the patient will be given a final warning and informed that should this event happen again, we will no longer continue to offer opioids as an alternative treatment option. In the event that our records reveal that this warning had previously been given to the patient, we will then immediately discontinue this mode of therapy.

## 2017-03-10 ENCOUNTER — Ambulatory Visit
Admission: RE | Admit: 2017-03-10 | Discharge: 2017-03-10 | Disposition: A | Discharge status: Home / Self Care | Payer: Medicare Other | Source: Ambulatory Visit | Attending: Pain Medicine | Admitting: Pain Medicine

## 2017-03-10 ENCOUNTER — Other Ambulatory Visit
Admission: RE | Admit: 2017-03-10 | Discharge: 2017-03-10 | Disposition: A | Discharge status: Home / Self Care | Payer: Medicare Other | Source: Ambulatory Visit | Attending: Pain Medicine | Admitting: Pain Medicine

## 2017-03-10 DIAGNOSIS — G8929 Other chronic pain: Secondary | ICD-10-CM | POA: Diagnosis not present

## 2017-03-10 DIAGNOSIS — M5441 Lumbago with sciatica, right side: Principal | ICD-10-CM

## 2017-03-10 DIAGNOSIS — I708 Atherosclerosis of other arteries: Secondary | ICD-10-CM | POA: Diagnosis not present

## 2017-03-10 DIAGNOSIS — M47896 Other spondylosis, lumbar region: Secondary | ICD-10-CM | POA: Insufficient documentation

## 2017-03-10 DIAGNOSIS — M545 Low back pain: Secondary | ICD-10-CM | POA: Insufficient documentation

## 2017-03-10 DIAGNOSIS — G894 Chronic pain syndrome: Secondary | ICD-10-CM

## 2017-03-10 DIAGNOSIS — D693 Immune thrombocytopenic purpura: Secondary | ICD-10-CM

## 2017-03-10 DIAGNOSIS — M4316 Spondylolisthesis, lumbar region: Secondary | ICD-10-CM | POA: Diagnosis not present

## 2017-03-10 DIAGNOSIS — M5442 Lumbago with sciatica, left side: Principal | ICD-10-CM

## 2017-03-10 LAB — COMPREHENSIVE METABOLIC PANEL
ALT: 6 U/L — AB (ref 14–54)
ANION GAP: 7 (ref 5–15)
AST: 18 U/L (ref 15–41)
Albumin: 3.9 g/dL (ref 3.5–5.0)
Alkaline Phosphatase: 55 U/L (ref 38–126)
BUN: 12 mg/dL (ref 6–20)
CHLORIDE: 106 mmol/L (ref 101–111)
CO2: 18 mmol/L — ABNORMAL LOW (ref 22–32)
CREATININE: 1.72 mg/dL — AB (ref 0.44–1.00)
Calcium: 8.8 mg/dL — ABNORMAL LOW (ref 8.9–10.3)
GFR, EST AFRICAN AMERICAN: 34 mL/min — AB (ref >60)
GFR, EST NON AFRICAN AMERICAN: 30 mL/min — AB (ref >60)
Glucose, Bld: 109 mg/dL — ABNORMAL HIGH (ref 65–99)
POTASSIUM: 3.8 mmol/L (ref 3.5–5.1)
Sodium: 131 mmol/L — ABNORMAL LOW (ref 135–145)
TOTAL PROTEIN: 6.9 g/dL (ref 6.5–8.1)
Total Bilirubin: 0.8 mg/dL (ref 0.3–1.2)

## 2017-03-10 LAB — PLATELET COUNT: Platelets: UNDETERMINED 10*3/uL (ref 150–440)

## 2017-03-10 LAB — MAGNESIUM: MAGNESIUM: 1.7 mg/dL (ref 1.7–2.4)

## 2017-03-10 LAB — VITAMIN B12: VITAMIN B 12: 409 pg/mL (ref 180–914)

## 2017-03-10 LAB — PLATELET FUNCTION ASSAY

## 2017-03-10 LAB — SEDIMENTATION RATE: SED RATE: 12 mm/h (ref 0–30)

## 2017-03-10 LAB — C-REACTIVE PROTEIN

## 2017-03-14 LAB — 25-HYDROXYVITAMIN D LCMS D2+D3
25-HYDROXY, VITAMIN D-3: 25 ng/mL
25-HYDROXY, VITAMIN D: 26 ng/mL — AB

## 2017-03-18 ENCOUNTER — Encounter: Payer: Self-pay | Admitting: Nurse Practitioner

## 2017-03-18 DIAGNOSIS — E559 Vitamin D deficiency, unspecified: Secondary | ICD-10-CM | POA: Insufficient documentation

## 2017-03-18 NOTE — Telephone Encounter (Signed)
Called pt and informed that she needs to start taking Vitamin D OTC. Patient wrote instructions down and will go to Drug store and get medication- Also told pt that her labs will be sent to her PCP, she missed appt today before she stated she didn't feel well. Instructed to call PCP and make her appt.

## 2017-03-19 NOTE — Progress Notes (Signed)
Result Date: 03/10/2017 CLINICAL DATA:  Chronic lumbago and lower extremity radicular symptoms EXAM: LUMBAR SPINE - COMPLETE WITH BENDING VIEWS COMPARISON:  Lumbar spine radiographs April 26, 2014 and lumbar MRI June 22, 2014 FINDINGS: Frontal, standing neutral lateral, standing flexion lateral, standing extension lateral, spot lumbosacral lateral, and bilateral oblique -total 7-views obtained. There are 5 non-rib-bearing lumbar type vertebral bodies. There is no fracture. There is no appreciable spondylolisthesis. There is no change in lateral alignment with flexion-extension. There is moderately severe disc space narrowing at L3-4, L4-5, L5-S1, slightly progressed from prior study. There is facet osteoarthritic change at all levels bilaterally, most notably at L5-S1 bilaterally. Calcification in the pelvis is likely due to uterine leiomyomas. There is aortoiliac atherosclerosis. IMPRESSION: Multilevel osteoarthritic change with overall progression since prior study. No fracture or spondylolisthesis. No change in lateral alignment with flexion-extension. Calcifications in the pelvis likely represent uterine leiomyomas. There is aortoiliac atherosclerosis. Electronically Signed   By: Lowella Grip III M.D.   On: 03/10/2017 12:03    Lumbar DG (Complete) 4+V:    Note: Results of ordered imaging test(s) reviewed. Results to be discussed at next office visit.

## 2017-03-23 ENCOUNTER — Other Ambulatory Visit: Payer: Self-pay | Admitting: Unknown Physician Specialty

## 2017-03-23 ENCOUNTER — Telehealth: Payer: Self-pay | Admitting: Unknown Physician Specialty

## 2017-03-23 MED ORDER — HYDROCODONE-ACETAMINOPHEN 7.5-325 MG PO TABS: 1.0000 | ORAL_TABLET | Freq: Two times a day (BID) | ORAL | 0 refills | Status: DC

## 2017-03-23 NOTE — Telephone Encounter (Signed)
Prescription written by [name]. Called and let patient know that RX was up front and ready to be picked up.

## 2017-03-23 NOTE — Telephone Encounter (Signed)
Patient called in regards to her leg pain mediation (Hydrocodone-aceptomenophen). Patient is calling about her refill and also calling to see about what she should do as far as an appointment due to her having an appointment to go to the pain clinic. Patient states that the pain clinic informed her to see another provider before her appointment but patient wants to speak with her provider before she does anything.   Please Advise.   Thank you.

## 2017-03-23 NOTE — Telephone Encounter (Signed)
Called and spoke to patient. She states that she has seen pain management 1 time and that they did not prescribe any medications on the first visit. She states that pain management is sending her to [facility] before seeing her for her 2nd visit with pain management. Patient states that she had an appointment on the 9th with [place] but had to reschedule because she was sick. Patient states she is going to see them on the 24th of this month at 1 pm. Patient wants to know if her pain medication can be refilled until she sees them. (See documentation in chart from pain management.)

## 2017-04-15 DIAGNOSIS — M47816 Spondylosis without myelopathy or radiculopathy, lumbar region: Secondary | ICD-10-CM | POA: Insufficient documentation

## 2017-04-15 NOTE — Progress Notes (Signed)
Patient's Name: Denise Everett  MRN: [id number]  Referring Provider: [name], NP  DOB: 1951-05-31  PCP: [name], NP  DOS: 04/16/2017  Note by: Kathlen Brunswick. Dossie Arbour, MD  Service setting: Ambulatory outpatient  Specialty: Interventional Pain Management  Location: [facility] (AMB) Pain Management Facility    Patient type: Established   Primary Reason(s) for Visit: Encounter for evaluation before starting new chronic pain management plan of care (Level of risk: moderate) CC: Back Pain (low) and Leg Pain (thigh pain bilaterally)  HPI  Denise Everett is a 66 y.o. year old, female patient, who comes today for a follow-up evaluation to review the test results and decide on a treatment plan. She has Lumbar IVDD (intervertebral disc displacement); Neuritis or radiculitis due to rupture of lumbar intervertebral disc; Schizophrenia ([place]); Nervous breakdown; Osteoporosis; CKD (chronic kidney disease), stage III; Tobacco use disorder; Chronic low back pain (Location of Primary Source of Pain) (Bilateral) (R>L); Idiopathic thrombocytopenic purpura ([place]); Allergic rhinitis; Hypertensive CKD (chronic kidney disease); Chronic lower extremity pain (Location of Secondary source of pain) (Bilateral) (L>R); Metabolic acidosis; Hyperlipidemia; Hypertension; MGUS (monoclonal gammopathy of unknown significance); Advance care planning; Personal history of tobacco use, presenting hazards to health; Chronic pain syndrome; Long term (current) use of opiate analgesic; Long term prescription opiate use; Opiate use; Chronic ankle pain  (Location of Tertiary source of pain) (Bilateral) (R>L); Chronic Lumbar radiculitis; Lumbar HNP (herniated nucleus pulposus); Abnormal MRI, lumbar spine (06/22/2014); Lumbar central spinal stenosis (Severe at L3-4); Lumbar foraminal stenosis (Severe: Left L3-4; Right L4-5; Bilateral L5-S1); Lumbar lateral recess stenosis (Severe: Left L3-4; Right L4-5; Left L5-S1); Marijuana use; Vitamin D insufficiency;  Osteoarthritis of lumbar spine; Lumbar facet arthropathy ([place]); Lumbar facet syndrome (Bilateral) (R>L); and Positive urine drug screen (02/24/17) (+ THC) on her problem list. Her primarily concern today is the Back Pain (low) and Leg Pain (thigh pain bilaterally)  Pain Assessment: Self-Reported Pain Score: 5 /10 Clinically the patient looks like a 2/10 Reported level is inconsistent with clinical observations. Information on the proper use of the pain scale provided to the patient today Pain Type: Chronic pain Pain Location: Back (legs) Pain Orientation: Lower (low back, both thighs) Pain Descriptors / Indicators: Aching, Cramping Pain Frequency: Constant  Denise Everett comes in today for a follow-up visit after her initial evaluation on 02/24/2017. Today we went over the results of her tests. These were explained in "Layman's terms". During today's appointment we went over my diagnostic impression, as well as the proposed treatment plan. In evaluating the results of the lab work and x-rays we see that patient has significant spinal stenosis that would require intraspinal injections (epidural steroid injections). Unfortunately, she also has thrombocytopenia and a prolong bleeding time that may be secondary to poor platelet dysfunction. At this point, we will like to see if the patient's medical condition can be optimized so that we can safely do these injections without the risk of intraspinal hematoma that may lead to permanent paralysis. The patient is currently seen at the cancer center by Dr. Delight Hoh. We will be contacting him to see if there is anything that he can do to lessen this risk for the patient. Normally, we will rely more on the patient's medications, but my evaluation has led me to believe that the patient is not very compliant with her treatments or medications. By her own admission, she has not done the things that she was told to do to minimize the damage to her kidneys. Today I  took  the time to try to explain to her the importance of following the physician's recommendations so that she would not end up with kidney failure. Unfortunately, the patient's UDS was positive for [facility] and therefore will not be prescribing any medications as long as this is present. Today we have retested her but I have warned her that if it comes back positive again, we will not be using any type of controlled substances. She understood and accepted.  In considering the treatment plan options, Denise Everett was reminded that I no longer take patients for medication management only. I asked her to let me know if she had no intention of taking advantage of the interventional therapies, so that we could make arrangements to provide this space to someone interested. I also made it clear that undergoing interventional therapies for the purpose of getting pain medications is very inappropriate on the part of a patient, and it will not be tolerated in this practice. This type of behavior would suggest true addiction and therefore it requires referral to an addiction specialist.   Further details on both, my assessment(s), as well as the proposed treatment plan, please see below. Controlled Substance Pharmacotherapy Assessment REMS (Risk Evaluation and Mitigation Strategy)  Analgesic: Hydrocodone/APAP 7.5/325 to the pill twice a day Highest recorded MME/day: 38.46 mg/day MME/day: 15 mg/day Pill Count: None expected due to no prior prescriptions written by our practice. Pharmacokinetics: Liberation and absorption (onset of action): WNL Distribution (time to peak effect): WNL Metabolism and excretion (duration of action): WNL         Pharmacodynamics: Desired effects: Analgesia: Ms. Karn reports >50% benefit. Functional ability: Patient reports that medication allows her to accomplish basic ADLs Clinically meaningful improvement in function (CMIF): Sustained CMIF goals met Perceived effectiveness: Described  as relatively effective, allowing for increase in activities of daily living (ADL) Undesirable effects: Side-effects or Adverse reactions: None reported Monitoring: [place] PMP: Online review of the past 60-monthperiod previously conducted. Not applicable at this point since we have not taken over the patient's medication management yet. List of all Serum Drug Screening Test(s):  No results found for: AMPHSCRSER, BARBSCRSER, BENZOSCRSER, COCAINSCRSER, PCPSCRSER, THCSCRSER, OPIATESCRSER, OMaud PD[place]List of all UDS test(s) done:  Lab Results  Component Value Date   SUMMARY FINAL 02/24/2017   Last UDS on record: Summary  Date Value Ref Range Status  02/24/2017 FINAL  Final    Comment:    ==================================================================== TOXASSURE COMP DRUG ANALYSIS,UR ==================================================================== Test                             Result       Flag       Units Drug Present and Declared for Prescription Verification   Risperidone                    PRESENT      EXPECTED Drug Present not Declared for Prescription Verification   Carboxy-THC                    32           UNEXPECTED ng/mg creat    Carboxy-THC is a metabolite of tetrahydrocannabinol  (THC).    [facility] of [facility] most commonly illicit, but THC is also present    in a scheduled prescription medication.   Salicylate  PRESENT      UNEXPECTED Drug Absent but Declared for Prescription Verification   Hydrocodone                    Not Detected UNEXPECTED ng/mg creat   Acetaminophen                  Not Detected UNEXPECTED    Acetaminophen, as indicated in the declared medication list, is    not always detected even when used as directed. ==================================================================== Test                      Result    Flag   Units      Ref Range   Creatinine              22               mg/dL       >=20 ==================================================================== Declared Medications:  The flagging and interpretation on this report are based on the  following declared medications.  Unexpected results may arise from  inaccuracies in the declared medications.  **Note: The testing scope of this panel includes these medications:  Hydrocodone (Norco)  Risperidone  **Note: The testing scope of this panel does not include small to  moderate amounts of these reported medications:  Acetaminophen (Norco)  **Note: The testing scope of this panel does not include following  reported medications:  Atorvastatin (Lipitor)  Lisinopril  Raloxifene (Evista) ==================================================================== For clinical consultation, please call [phone]. ====================================================================    UDS interpretation: No unexpected findings.          Medication Assessment Form: Patient introduced to form today Treatment compliance: Treatment may start today if patient agrees with proposed plan. Evaluation of compliance is not applicable at this point Risk Assessment Profile: Aberrant behavior: See initial evaluations. None observed or detected today Comorbid factors increasing risk of overdose: See initial evaluation. No additional risks detected today Risk Mitigation Strategies:  Patient opioid safety counseling: Completed today. Counseling provided to patient as per "Patient Counseling Document". Document signed by patient, attesting to counseling and understanding Patient-Prescriber Agreement (PPA): Obtained today  Controlled substance notification to other providers: Written and sent today  Pharmacologic Plan: Today we may be taking over the patient's pharmacological regimen. See below  Laboratory Chemistry  Inflammation Markers Lab Results  Component Value Date   CRP <0.8 03/10/2017   ESRSEDRATE 12 03/10/2017   (CRP: Acute  Phase) (ESR: Chronic Phase) Renal Function Markers Lab Results  Component Value Date   BUN 12 03/10/2017   CREATININE 1.72 (H) 03/10/2017   GFRAA 34 (L) 03/10/2017   GFRNONAA 30 (L) 03/10/2017   Hepatic Function Markers Lab Results  Component Value Date   AST 18 03/10/2017   ALT 6 (L) 03/10/2017   ALBUMIN 3.9 03/10/2017   ALKPHOS 55 03/10/2017   Electrolytes Lab Results  Component Value Date   NA 131 (L) 03/10/2017   K 3.8 03/10/2017   CL 106 03/10/2017   CALCIUM 8.8 (L) 03/10/2017   MG 1.7 03/10/2017   Neuropathy Markers Lab Results  Component Value Date   VITAMINB12 409 03/10/2017   Bone Pathology Markers Lab Results  Component Value Date   ALKPHOS 55 03/10/2017   25OHVITD1 26 (L) 03/10/2017   25OHVITD2 <1.0 03/10/2017   25OHVITD3 25 03/10/2017   CALCIUM 8.8 (L) 03/10/2017   Coagulation Parameters Lab Results  Component Value Date   PLT PLATELET CLUMPS NOTED ON SMEAR,  UNABLE TO ESTIMATE 03/10/2017   Cardiovascular Markers Lab Results  Component Value Date   HGB 11.6 (L) 01/14/2017   HCT 33.9 (L) 01/14/2017   Note: Lab results reviewed.  Recent Diagnostic Imaging Review  Dg Lumbar Spine Complete W/bend Result Date: 03/10/2017 CLINICAL DATA:  Chronic lumbago and lower extremity radicular symptoms EXAM: LUMBAR SPINE - COMPLETE WITH BENDING VIEWS COMPARISON:  Lumbar spine radiographs April 26, 2014 and lumbar MRI June 22, 2014 FINDINGS: Frontal, standing neutral lateral, standing flexion lateral, standing extension lateral, spot lumbosacral lateral, and bilateral oblique -total 7-views obtained. There are 5 non-rib-bearing lumbar type vertebral bodies. There is no fracture. There is no appreciable spondylolisthesis. There is no change in lateral alignment with flexion-extension. There is moderately severe disc space narrowing at L3-4, L4-5, L5-S1, slightly progressed from prior study. There is facet osteoarthritic change at all levels bilaterally, most notably at  L5-S1 bilaterally. Calcification in the pelvis is likely due to uterine leiomyomas. There is aortoiliac atherosclerosis. IMPRESSION: Multilevel osteoarthritic change with overall progression since prior study. No fracture or spondylolisthesis. No change in lateral alignment with flexion-extension. Calcifications in the pelvis likely represent uterine leiomyomas. There is aortoiliac atherosclerosis. Electronically Signed   By: Lowella Grip III M.D.   On: 03/10/2017 12:03   Lumbosacral Imaging: Lumbar MR wo contrast:  Results for orders placed in visit on 06/22/14  MR L Spine Ltd W/O Cm   Narrative * PRIOR REPORT IMPORTED FROM AN EXTERNAL SYSTEM *   CLINICAL DATA:  66 year old female with low back pain radiating to  both lower extremities to the knees. Multiple falls. Initial  encounter.   EXAM:  MRI LUMBAR SPINE WITHOUT CONTRAST   TECHNIQUE:  Multiplanar, multisequence MR imaging of the lumbar spine was  performed. No intravenous contrast was administered.   COMPARISON:  Lumbar spine radiograph 04/26/2014. CT Abdomen and  Pelvis 03/22/2013.   FINDINGS:  Normal lumbar segmentation demonstrated on comparisons. Vertebral  height and alignment appears stable. Mild marrow edema in the L3 and  L4 vertebrae primarily along the endplates appears to be  degenerative in nature. See additional degenerative findings at that  level below. Superimposed scattered chronic endplate Schmorl nodes.  No acute osseous abnormality identified.   Visualized lower thoracic spinal cord is normal with conus medularis  at L2.   Stable visualized abdominal viscera. Visualized posterior paraspinal  soft tissues are within normal limits.   T10-T11: Partially visible disc bulge. No definite spinal stenosis.   T11-T12: Disc bulge and mild facet hypertrophy without significant  stenosis.   T12-L1:  Negative.   L1-L2: Disc bulge with subtle superimposed left paracentral disc  protrusion best seen on  series 5, image 9. Slight cephalad migration  of disc as seen on series 2, image 9. Mild left lateral recess  stenosis. No significant overall spinal stenosis.   L2-L3: Mild disc bulge. Mild to moderate facet hypertrophy. Overall  mild spinal stenosis.   L3-L4: Bulky left far lateral eccentric disc osteophyte complex.  Superimposed central disc extrusion best seen on series 2, image 8.  Superimposed moderate to severe facet hypertrophy. Trace facet joint  fluid.   Combined there is severe left L3 foraminal stenosis, severe left  lateral recess stenosis, and moderate to severe spinal stenosis.   L4-L5: Vacuum disc phenomena. Bulky right eccentric circumferential  disc osteophyte complex. Superimposed right paracentral  -subarticular disc extrusion best seen on series 2, image 7 and  series 5, image 29. Superimposed moderate facet hypertrophy with  trace facet  joint fluid.   Combined there is severe right lateral recess stenosis and right L4  foraminal stenosis. No significant overall spinal stenosis.   L5-S1: Severe disc space loss and vacuum disc phenomena. Bulky  circumferential disc osteophyte complex. Superimposed left  paracentral caudal disc extrusion best seen on series 2, image 9 and  series 5, images 34 and 35. No definite sequestered disc fragment.  Superimposed mild facet and ligament flavum hypertrophy.   Combined there is severe left lateral recess stenosis. There is  moderate to severe bilateral L5 foraminal stenosis primarily due to  endplate spurring. No significant spinal stenosis.   IMPRESSION:  1. Advanced disc and endplate degeneration T4-S5 through L5-S1.  Multifactorial moderate to severe spinal stenosis at L3-L4 plus  severe lateral recess stenosis on the left at L3-L4, on the right at  L4-L5, and on the left at L5-S1.  2. Associated severe neural foraminal stenosis on the left at L3-L4,  on the right at L4-L5, and bilaterally at L5-S1.  3. Small disc  herniation at L1-L2 resulting in mild left lateral  recess stenosis at that level.    Electronically Signed    By: Lars Pinks M.D.    On: 06/22/2014 10:47       Lumbar DG (Complete) 4+V:  Results for orders placed in visit on 04/26/14  DG Lumbar Spine Complete   Narrative * PRIOR REPORT IMPORTED FROM AN EXTERNAL SYSTEM *   CLINICAL DATA:  Leg pain and numbness.   EXAM:  LUMBAR SPINE - COMPLETE 4+ VIEW   COMPARISON:  None.   FINDINGS:  No evidence for fracture. Marked loss of disc height with endplate  degeneration is seen at L4-5 and L5-S1. There is also loss of disc  height at L3-4. Lower lumbar facet degeneration is associated. SI  joints are unremarkable. Course calcification over the central  anatomic pelvis is compatible with fibroids.   IMPRESSION:  Lower lumbar degenerative changes.    Electronically Signed    By: Misty Stanley M.D.    On: 04/26/2014 13:37       Lumbar DG Bending views:  Results for orders placed during the hospital encounter of 03/10/17  DG Lumbar Spine Complete W/Bend   Narrative CLINICAL DATA:  Chronic lumbago and lower extremity radicular symptoms  EXAM: LUMBAR SPINE - COMPLETE WITH BENDING VIEWS  COMPARISON:  Lumbar spine radiographs April 26, 2014 and lumbar MRI June 22, 2014  FINDINGS: Frontal, standing neutral lateral, standing flexion lateral, standing extension lateral, spot lumbosacral lateral, and bilateral oblique -total 7-views obtained. There are 5 non-rib-bearing lumbar type vertebral bodies. There is no fracture. There is no appreciable spondylolisthesis. There is no change in lateral alignment with flexion-extension. There is moderately severe disc space narrowing at L3-4, L4-5, L5-S1, slightly progressed from prior study. There is facet osteoarthritic change at all levels bilaterally, most notably at L5-S1 bilaterally. Calcification in the pelvis is likely due to uterine leiomyomas. There is aortoiliac  atherosclerosis.  IMPRESSION: Multilevel osteoarthritic change with overall progression since prior study. No fracture or spondylolisthesis. No change in lateral alignment with flexion-extension. Calcifications in the pelvis likely represent uterine leiomyomas. There is aortoiliac atherosclerosis.   Electronically Signed   By: Lowella Grip III M.D.   On: 03/10/2017 12:03    Note: Results of ordered imaging test(s) reviewed and explained to patient in Layman's terms. Copy of results provided to patient  Meds  The patient has a current medication list which includes the following prescription(s): atorvastatin, hydrocodone-acetaminophen,  lisinopril, raloxifene, and risperidone.  Current Outpatient Prescriptions on File Prior to Visit  Medication Sig  . atorvastatin (LIPITOR) 10 MG tablet Take 1 tablet (10 mg total) by mouth daily at 6 PM.  . HYDROcodone-acetaminophen (NORCO) 7.5-325 MG tablet Take 1 tablet by mouth 2 (two) times daily.  Marland Kitchen lisinopril (PRINIVIL,ZESTRIL) 10 MG tablet TAKE ONE TABLET BY MOUTH DAILY  . raloxifene (EVISTA) 60 MG tablet Take 1 tablet (60 mg total) by mouth daily.  . risperiDONE (RISPERDAL) 2 MG tablet Take 0.5-1 tablets (1-2 mg total) by mouth at bedtime.   No current facility-administered medications on file prior to visit.    ROS  Constitutional: Denies any fever or chills Gastrointestinal: No reported hemesis, hematochezia, vomiting, or acute GI distress Musculoskeletal: Denies any acute onset joint swelling, redness, loss of ROM, or weakness Neurological: No reported episodes of acute onset apraxia, aphasia, dysarthria, agnosia, amnesia, paralysis, loss of coordination, or loss of consciousness  Allergies  Ms. Koogler has No Known Allergies.  [place]  Drug: Ms. Snead  reports that she does not use drugs. Alcohol:  reports that she does not drink alcohol. Tobacco:  reports that she has been smoking Cigarettes.  She has a 43.00 pack-year smoking  history. She has quit using smokeless tobacco. Her smokeless tobacco use included Snuff. Medical:  has a past medical history of Abnormal weight loss (06/07/2015); Allergy; Cataract; Chronic kidney disease; Chronic pain syndrome; Hyperlipidemia; Hypertension; Leg pain; Osteoporosis; Renal insufficiency; Schizophrenia ([place]); Thrombocytopenia ([place]); and Tobacco use disorder. Family: family history includes Hyperlipidemia in her mother; Hypertension in her mother.  Past Surgical History:  Procedure Laterality Date  . ankle surgery Right    x's 2  . CATARACT EXTRACTION     Constitutional Exam  General appearance: Well nourished, well developed, and well hydrated. In no apparent acute distress Vitals:   04/16/17 1043  BP: 114/68  Pulse: 60  Resp: 20  Temp: 97 F (36.1 C)  SpO2: 100%  Weight: 140 lb (63.5 kg)  Height: '5\' 5"'  (1.651 m)   BMI Assessment: Estimated body mass index is 23.3 kg/m as calculated from the following:   Height as of this encounter: '5\' 5"'  (1.651 m).   Weight as of this encounter: 140 lb (63.5 kg).  BMI interpretation table: BMI level Category Range association with higher incidence of chronic pain  <18 kg/m2 Underweight   18.5-24.9 kg/m2 Ideal body weight   25-29.9 kg/m2 Overweight Increased incidence by 20%  30-34.9 kg/m2 Obese (Class I) Increased incidence by 68%  35-39.9 kg/m2 Severe obesity (Class II) Increased incidence by 136%  >40 kg/m2 Extreme obesity (Class III) Increased incidence by 254%   BMI Readings from Last 4 Encounters:  04/16/17 23.30 kg/m  02/24/17 22.60 kg/m  02/03/17 25.65 kg/m  01/14/17 25.74 kg/m   Wt Readings from Last 4 Encounters:  04/16/17 140 lb (63.5 kg)  02/24/17 140 lb (63.5 kg)  02/03/17 138 lb (62.6 kg)  01/14/17 138 lb 7.2 oz (62.8 kg)  Psych/Mental status: Alert, oriented x 3 (person, place, & time)       Eyes: PERLA Respiratory: No evidence of acute respiratory distress  Cervical Spine Exam  Inspection: No  masses, redness, or swelling Alignment: Symmetrical Functional ROM: Unrestricted ROM      Stability: No instability detected Muscle strength & Tone: Functionally intact Sensory: Unimpaired Palpation: No palpable anomalies              Upper Extremity (UE) Exam    Side: Right upper  extremity  Side: Left upper extremity  Inspection: No masses, redness, swelling, or asymmetry. No contractures  Inspection: No masses, redness, swelling, or asymmetry. No contractures  Functional ROM: Unrestricted ROM          Functional ROM: Unrestricted ROM          Muscle strength & Tone: Functionally intact  Muscle strength & Tone: Functionally intact  Sensory: Unimpaired  Sensory: Unimpaired  Palpation: No palpable anomalies              Palpation: No palpable anomalies              Specialized Test(s): Deferred         Specialized Test(s): Deferred          Thoracic Spine Exam  Inspection: No masses, redness, or swelling Alignment: Symmetrical Functional ROM: Unrestricted ROM Stability: No instability detected Sensory: Unimpaired Muscle strength & Tone: No palpable anomalies  Lumbar Spine Exam  Inspection: No masses, redness, or swelling Alignment: Symmetrical Functional ROM: Minimal ROM      Stability: No instability detected Muscle strength & Tone: Functionally intact Sensory: Movement-associated pain Palpation: Complains of area being tender to palpation       Provocative Tests: Lumbar Hyperextension and rotation test: Positive bilaterally for facet joint pain. Patrick's Maneuver: evaluation deferred today                    Gait & Posture Assessment  Ambulation: Patient ambulates using a cane Gait: Very limited, using assistive device to ambulate Posture: WNL   Lower Extremity Exam    Side: Right lower extremity  Side: Left lower extremity  Inspection: No masses, redness, swelling, or asymmetry. No contractures  Inspection: No masses, redness, swelling, or asymmetry. No contractures   Functional ROM: Unrestricted ROM          Functional ROM: Unrestricted ROM          Muscle strength & Tone: Deconditioned  Muscle strength & Tone: Deconditioned  Sensory: Unimpaired  Sensory: Unimpaired  Palpation: No palpable anomalies  Palpation: No palpable anomalies   Assessment & Plan  Primary Diagnosis & Pertinent Problem List: The primary encounter diagnosis was Chronic low back pain (Location of Primary Source of Pain) (Bilateral) (R>L). Diagnoses of Osteoarthritis of lumbar spine, Lumbar facet arthropathy (HCC), Lumbar facet syndrome (Bilateral) (R>L), Lumbar foraminal stenosis (Severe: Left L3-4; Right L4-5; Bilateral L5-S1), Lumbar lateral recess stenosis (Severe: Left L3-4; Right L4-5; Left L5-S1), Chronic Lumbar radiculitis, Chronic lower extremity pain (Location of Secondary source of pain) (Bilateral) (L>R), Chronic ankle pain  (Location of Tertiary source of pain) (Bilateral) (R>L), Vitamin D insufficiency, Idiopathic thrombocytopenic purpura ([place]), CKD (chronic kidney disease), stage III, Positive urine drug screen, Marijuana use, Long term prescription opiate use, and Opiate use were also pertinent to this visit.  Visit Diagnosis: 1. Chronic low back pain (Location of Primary Source of Pain) (Bilateral) (R>L)   2. Osteoarthritis of lumbar spine   3. Lumbar facet arthropathy ([place])   4. Lumbar facet syndrome (Bilateral) (R>L)   5. Lumbar foraminal stenosis (Severe: Left L3-4; Right L4-5; Bilateral L5-S1)   6. Lumbar lateral recess stenosis (Severe: Left L3-4; Right L4-5; Left L5-S1)   7. Chronic Lumbar radiculitis   8. Chronic lower extremity pain (Location of Secondary source of pain) (Bilateral) (L>R)   9. Chronic ankle pain  (Location of Tertiary source of pain) (Bilateral) (R>L)   10. Vitamin D insufficiency   11. Idiopathic thrombocytopenic purpura ([place])   12.  CKD (chronic kidney disease), stage III   13. Positive urine drug screen   14. Marijuana use   15. Long term  prescription opiate use   16. Opiate use    Problems updated and reviewed during this visit: Problem  Positive urine drug screen (02/24/17) (+ THC)  Ckd (Chronic Kidney Disease), Stage III  Idiopathic Thrombocytopenic Purpura ([facility])   Problem-specific Plan(s): No problem-specific Assessment & Plan notes found for this encounter.  Assessment & plan notes cannot be loaded without a specified hospital service.  Plan of Care  Pharmacotherapy (Medications Ordered): No orders of the defined types were placed in this encounter.  Lab-work, procedure(s), and/or referral(s): Orders Placed This Encounter  Procedures  . MR LUMBAR SPINE WO CONTRAST  . ToxASSURE Select 13 (MW), Urine    Pharmacotherapy: Opioid Analgesics: We'll take over management today. See above orders Membrane stabilizer: We have discussed the possibility of optimizing this mode of therapy, if tolerated Muscle relaxant: We have discussed the possibility of a trial NSAID: We have discussed the possibility of a trial Other analgesic(s): To be determined at a later time   Interventional therapies: Planned, scheduled, and/or pending:    Diagnostic bilateral lumbar facet block (Pending clearance from Dr. Grayland Ormond)   Considering:   Diagnostic bilateral lumbar facet block  Possible bilateral lumbar facet RFA  Diagnostic L3-4 translaminar lumbar epidural steroid injection  Diagnostic left-sided L3-4 transforaminal epidural steroid injection  Diagnostic right-sided L4-5 transforaminal epidural steroid injection  Diagnostic bilateral L5-S1 transforaminal epidural steroid injection    PRN Procedures:   To be determined at a later time   Provider-requested follow-up: Return for procedure (w/ sedation), when cleared by Dr. Grayland Ormond, by MD.  Future Appointments Date Time Provider [place]  07/22/2017 2:00 PM [facility] None  07/22/2017 2:15 PM Grayland Ormond, Kathlene November, MD [facility] None  09/23/2017 10:00  AM [name], NP [facility] None    Primary Care Physician: [name], NP Location: [facility] Note by: Kathlen Brunswick. Dossie Arbour, M.D, DABA, DABAPM, DABPM, DABIPP, FIPP Date: 04/16/2017; Time: 12:49 PM  Patient instructions provided during this appointment: Patient Instructions  ____________________________________________________________________________________________  Preparing for Procedure with Sedation Instructions: . Oral Intake: Do not eat or drink anything for at least 8 hours prior to your procedure. . Transportation: Public transportation is not allowed. Bring an adult driver. The driver must be physically present in our waiting room before any procedure can be started. Marland Kitchen Physical Assistance: Bring an adult physically capable of assisting you, in the event you need help. This adult should keep you company at home for at least 6 hours after the procedure. . Blood Pressure Medicine: Take your blood pressure medicine with a sip of water the morning of the procedure. . Blood thinners:  . Diabetics on insulin: Notify the staff so that you can be scheduled 1st case in the morning. If your diabetes requires high dose insulin, take only  of your normal insulin dose the morning of the procedure and notify the staff that you have done so. . Preventing infections: Shower with an antibacterial soap the morning of your procedure. . Build-up your immune system: Take 1000 mg of Vitamin C with every meal (3 times a day) the day prior to your procedure. Marland Kitchen Antibiotics: Inform the staff if you have a condition or reason that requires you to take antibiotics before dental procedures. . Pregnancy: If you are pregnant, call and cancel the procedure. . Sickness: If you have a cold, fever,  or any active infections, call and cancel the procedure. . Arrival: You must be in the facility at least 30 minutes prior to your scheduled procedure. . Children: Do not bring children  with you. . Dress appropriately: Bring dark clothing that you would not mind if they get stained. . Valuables: Do not bring any jewelry or valuables. Procedure appointments are reserved for interventional treatments only. Marland Kitchen No Prescription Refills. . No medication changes will be discussed during procedure appointments. . No disability issues will be discussed. ____________________________________________________________________________________________

## 2017-04-16 ENCOUNTER — Telehealth: Payer: Self-pay | Admitting: Unknown Physician Specialty

## 2017-04-16 ENCOUNTER — Encounter: Payer: Self-pay | Admitting: Pain Medicine

## 2017-04-16 ENCOUNTER — Ambulatory Visit: Payer: Medicare Other | Attending: Pain Medicine | Admitting: Pain Medicine

## 2017-04-16 VITALS — BP 114/68 | HR 60 | Temp 97.0°F | Resp 20 | Ht 65.0 in | Wt 140.0 lb

## 2017-04-16 DIAGNOSIS — N183 Chronic kidney disease, stage 3 unspecified: Secondary | ICD-10-CM

## 2017-04-16 DIAGNOSIS — I129 Hypertensive chronic kidney disease with stage 1 through stage 4 chronic kidney disease, or unspecified chronic kidney disease: Secondary | ICD-10-CM | POA: Insufficient documentation

## 2017-04-16 DIAGNOSIS — G8929 Other chronic pain: Secondary | ICD-10-CM

## 2017-04-16 DIAGNOSIS — H269 Unspecified cataract: Secondary | ICD-10-CM | POA: Insufficient documentation

## 2017-04-16 DIAGNOSIS — M4807 Spinal stenosis, lumbosacral region: Secondary | ICD-10-CM | POA: Insufficient documentation

## 2017-04-16 DIAGNOSIS — E559 Vitamin D deficiency, unspecified: Secondary | ICD-10-CM

## 2017-04-16 DIAGNOSIS — M79604 Pain in right leg: Secondary | ICD-10-CM | POA: Diagnosis not present

## 2017-04-16 DIAGNOSIS — Z9849 Cataract extraction status, unspecified eye: Secondary | ICD-10-CM | POA: Insufficient documentation

## 2017-04-16 DIAGNOSIS — M47816 Spondylosis without myelopathy or radiculopathy, lumbar region: Secondary | ICD-10-CM | POA: Diagnosis not present

## 2017-04-16 DIAGNOSIS — M9983 Other biomechanical lesions of lumbar region: Secondary | ICD-10-CM | POA: Insufficient documentation

## 2017-04-16 DIAGNOSIS — M4696 Unspecified inflammatory spondylopathy, lumbar region: Secondary | ICD-10-CM | POA: Insufficient documentation

## 2017-04-16 DIAGNOSIS — F1290 Cannabis use, unspecified, uncomplicated: Secondary | ICD-10-CM | POA: Insufficient documentation

## 2017-04-16 DIAGNOSIS — Z8249 Family history of ischemic heart disease and other diseases of the circulatory system: Secondary | ICD-10-CM | POA: Diagnosis not present

## 2017-04-16 DIAGNOSIS — M79605 Pain in left leg: Secondary | ICD-10-CM | POA: Diagnosis not present

## 2017-04-16 DIAGNOSIS — M25571 Pain in right ankle and joints of right foot: Secondary | ICD-10-CM

## 2017-04-16 DIAGNOSIS — M25572 Pain in left ankle and joints of left foot: Secondary | ICD-10-CM | POA: Diagnosis not present

## 2017-04-16 DIAGNOSIS — F17200 Nicotine dependence, unspecified, uncomplicated: Secondary | ICD-10-CM | POA: Diagnosis not present

## 2017-04-16 DIAGNOSIS — M5442 Lumbago with sciatica, left side: Secondary | ICD-10-CM

## 2017-04-16 DIAGNOSIS — E785 Hyperlipidemia, unspecified: Secondary | ICD-10-CM | POA: Insufficient documentation

## 2017-04-16 DIAGNOSIS — R825 Elevated urine levels of drugs, medicaments and biological substances: Secondary | ICD-10-CM | POA: Insufficient documentation

## 2017-04-16 DIAGNOSIS — M5416 Radiculopathy, lumbar region: Secondary | ICD-10-CM | POA: Diagnosis not present

## 2017-04-16 DIAGNOSIS — Z9889 Other specified postprocedural states: Secondary | ICD-10-CM | POA: Diagnosis not present

## 2017-04-16 DIAGNOSIS — G894 Chronic pain syndrome: Secondary | ICD-10-CM | POA: Diagnosis not present

## 2017-04-16 DIAGNOSIS — F1190 Opioid use, unspecified, uncomplicated: Secondary | ICD-10-CM | POA: Diagnosis not present

## 2017-04-16 DIAGNOSIS — M5441 Lumbago with sciatica, right side: Secondary | ICD-10-CM

## 2017-04-16 DIAGNOSIS — M810 Age-related osteoporosis without current pathological fracture: Secondary | ICD-10-CM | POA: Diagnosis not present

## 2017-04-16 DIAGNOSIS — M48061 Spinal stenosis, lumbar region without neurogenic claudication: Secondary | ICD-10-CM | POA: Insufficient documentation

## 2017-04-16 DIAGNOSIS — D693 Immune thrombocytopenic purpura: Secondary | ICD-10-CM | POA: Diagnosis not present

## 2017-04-16 DIAGNOSIS — Z79891 Long term (current) use of opiate analgesic: Secondary | ICD-10-CM | POA: Insufficient documentation

## 2017-04-16 DIAGNOSIS — M545 Low back pain: Secondary | ICD-10-CM | POA: Diagnosis not present

## 2017-04-16 NOTE — Telephone Encounter (Signed)
Ms Denise Everett called to see if Denise Everett would prescribe another month of pain med for her. She has been seen by the pain clinic but they are waiting to do injections it it.   She asked for Denise Everett.  Thanks  [phone]

## 2017-04-16 NOTE — Patient Instructions (Signed)

## 2017-04-16 NOTE — Progress Notes (Signed)
Safety precautions to be maintained throughout the outpatient stay will include: orient to surroundings, keep bed in low position, maintain call bell within reach at all times, provide assistance with transfer out of bed and ambulation.  

## 2017-04-17 ENCOUNTER — Telehealth: Payer: Self-pay | Admitting: Pain Medicine

## 2017-04-17 NOTE — Telephone Encounter (Signed)
Called and spoke to patient. I explained to her what [name] said. Patient stated that she was hoping [name] could help her out until pain management gave her the shots for her pain. Patient states that she is having to go to oncology before they give her any shots because of low platelets. Patient states that she was hoping that [name] could give her enough medication until then. I explained that I would ask [name] but explained that she may not be able to since she is under the care of pain management now.

## 2017-04-17 NOTE — Telephone Encounter (Signed)
Routing to provider  

## 2017-04-17 NOTE — Telephone Encounter (Signed)
I have referred her for management of pain and any medication should go through the pain clinic.

## 2017-04-17 NOTE — Telephone Encounter (Signed)
Called and let patient know what Denise Everett said. Patient states that she understands but she states that she is going to try to find another pain doctor who can treat her sooner because she states she is in a lot of pain.

## 2017-04-17 NOTE — Telephone Encounter (Signed)
If she feels like she needs pain meds before her shots she needs to ask them

## 2017-04-17 NOTE — Telephone Encounter (Signed)
Patient left vmail stating she wants to speak with Nurse [phone]

## 2017-04-20 ENCOUNTER — Telehealth: Payer: Self-pay | Admitting: Unknown Physician Specialty

## 2017-04-20 MED ORDER — HYDROCODONE-ACETAMINOPHEN 7.5-325 MG PO TABS: 1.0000 | ORAL_TABLET | Freq: Two times a day (BID) | ORAL | 0 refills | Status: DC

## 2017-04-20 NOTE — Telephone Encounter (Signed)
Patient would like pain medication but provider at pain management clinic has not prescribed medication and will not prescribe medication for pain until patient is well established. Clinic is asking if Denise Everett could prescribe the pain medication.   Please Advise.  Thank you

## 2017-04-20 NOTE — Telephone Encounter (Signed)
Asking what to do about her pain. Has been seen by Dr. Dossie Arbour twice, he does not prescribe at this time. Patient informed that she should contact her PCP. States she has and they wont prescribe because she is a patient here. I will call [facility] office and notify them that Dr. Dossie Arbour does not prescribe pain meds at this time.

## 2017-04-20 NOTE — Telephone Encounter (Signed)
I will prescribe one more time.  Please let pt know the pain clinic will need to prescribe meds from now on

## 2017-04-20 NOTE — Telephone Encounter (Signed)
Routing to provider. Call from pain clinic.

## 2017-04-20 NOTE — Telephone Encounter (Signed)
Patient called to see if pain clinic called in regards to her medicine. Patient asked if provider could call her in regards to if she will prescribe her pain medicine of not.

## 2017-04-20 NOTE — Telephone Encounter (Signed)
Called and let patient know what [name] said and about prescription being ready for pick up. Patient verbalized understanding.

## 2017-04-23 LAB — TOXASSURE SELECT 13 (MW), URINE

## 2017-04-28 ENCOUNTER — Ambulatory Visit: Payer: Medicare Other

## 2017-05-29 ENCOUNTER — Encounter: Payer: Self-pay | Admitting: Emergency Medicine

## 2017-05-29 ENCOUNTER — Emergency Department
Admission: EM | Admit: 2017-05-29 | Discharge: 2017-05-29 | Disposition: A | Discharge status: Home / Self Care | Payer: Medicare Other | Attending: Emergency Medicine | Admitting: Emergency Medicine

## 2017-05-29 ENCOUNTER — Emergency Department: Payer: Medicare Other

## 2017-05-29 DIAGNOSIS — M25552 Pain in left hip: Secondary | ICD-10-CM | POA: Diagnosis not present

## 2017-05-29 DIAGNOSIS — Z79891 Long term (current) use of opiate analgesic: Secondary | ICD-10-CM | POA: Insufficient documentation

## 2017-05-29 DIAGNOSIS — F17210 Nicotine dependence, cigarettes, uncomplicated: Secondary | ICD-10-CM | POA: Insufficient documentation

## 2017-05-29 DIAGNOSIS — Z79899 Other long term (current) drug therapy: Secondary | ICD-10-CM | POA: Diagnosis not present

## 2017-05-29 DIAGNOSIS — I129 Hypertensive chronic kidney disease with stage 1 through stage 4 chronic kidney disease, or unspecified chronic kidney disease: Secondary | ICD-10-CM | POA: Diagnosis not present

## 2017-05-29 DIAGNOSIS — N183 Chronic kidney disease, stage 3 (moderate): Secondary | ICD-10-CM | POA: Diagnosis not present

## 2017-05-29 DIAGNOSIS — R1030 Lower abdominal pain, unspecified: Secondary | ICD-10-CM | POA: Diagnosis not present

## 2017-05-29 MED ORDER — TRAMADOL HCL 50 MG PO TABS: 50.0000 mg | ORAL_TABLET | Freq: Four times a day (QID) | ORAL | 0 refills | Status: AC | PRN

## 2017-05-29 NOTE — ED Notes (Signed)

## 2017-05-29 NOTE — ED Triage Notes (Signed)
Pt c/o left groin pain radiating towards her buttocks. Pt states it is painful to walk and noticed it more when she was carrying a bag with her right arm.  Pt states she has been having pain in her legs, but that pain has gone away. Pt was referred to pain clinic but did not go back because she states they weren't treating her.

## 2017-05-29 NOTE — ED Provider Notes (Signed)
[facility] Emergency Department Provider Note  ____________________________________________  Time seen: Approximately 6:30 PM  I have reviewed the triage vital signs and the nursing notes.   HISTORY  Chief Complaint Groin Pain    HPI Denise Everett is a 66 y.o. female presenting to the emergency department with 10 out of 10 left hip pain. Patient states that her pain is worsened with ambulation and relieved with rest. Patient states that she had a fall approximately 1 month ago and had x-rays. Patient states that she is being seen by pain management facility. Patient states that her pain is worsening and she presents to the emergency department for "answers". Patient denies radiculopathy, weakness or changes in sensation of the left lower extremity. No recent falls. No alleviating measures have been attempted.    Past Medical History:  Diagnosis Date  . Abnormal weight loss 06/07/2015  . Allergy   . Cataract   . Chronic kidney disease   . Chronic pain syndrome   . Hyperlipidemia   . Hypertension   . Leg pain   . Osteoporosis    hips  . Renal insufficiency   . Schizophrenia ([place])   . Thrombocytopenia ([place])   . Tobacco use disorder     Patient Active Problem List   Diagnosis Date Noted  . Positive urine drug screen (02/24/17) (+ THC) 04/16/2017  . Osteoarthritis of lumbar spine 04/15/2017  . Lumbar facet arthropathy ([place]) 04/15/2017  . Lumbar facet syndrome (Bilateral) (R>L) 04/15/2017  . Vitamin D insufficiency 03/18/2017  . Marijuana use 03/03/2017  . Chronic pain syndrome 02/24/2017  . Long term (current) use of opiate analgesic 02/24/2017  . Long term prescription opiate use 02/24/2017  . Opiate use 02/24/2017  . Chronic ankle pain  (Location of Tertiary source of pain) (Bilateral) (R>L) 02/24/2017  . Abnormal MRI, lumbar spine (06/22/2014) 02/24/2017  . Lumbar central spinal stenosis (Severe at L3-4) 02/24/2017  . Lumbar foraminal  stenosis (Severe: Left L3-4; Right L4-5; Bilateral L5-S1) 02/24/2017  . Lumbar lateral recess stenosis (Severe: Left L3-4; Right L4-5; Left L5-S1) 02/24/2017  . Personal history of tobacco use, presenting hazards to health 02/03/2017  . Advance care planning 10/08/2016  . MGUS (monoclonal gammopathy of unknown significance) 06/23/2016  . Schizophrenia ([place]) 06/07/2015  . Nervous breakdown 06/07/2015  . Osteoporosis 06/07/2015  . CKD (chronic kidney disease), stage III 06/07/2015  . Tobacco use disorder 06/07/2015  . Chronic low back pain (Location of Primary Source of Pain) (Bilateral) (R>L) 06/07/2015  . Idiopathic thrombocytopenic purpura ([place]) 06/07/2015  . Allergic rhinitis 06/07/2015  . Hypertensive CKD (chronic kidney disease) 06/07/2015  . Chronic lower extremity pain (Location of Secondary source of pain) (Bilateral) (L>R) 06/07/2015  . Metabolic acidosis 67/10/4579  . Hyperlipidemia 06/07/2015  . Hypertension 06/07/2015  . Lumbar IVDD (intervertebral disc displacement) 09/29/2014  . Neuritis or radiculitis due to rupture of lumbar intervertebral disc 09/29/2014  . Chronic Lumbar radiculitis 09/29/2014  . Lumbar HNP (herniated nucleus pulposus) 09/29/2014    Past Surgical History:  Procedure Laterality Date  . ankle surgery Right    x's 2  . CATARACT EXTRACTION      Prior to Admission medications   Medication Sig Start Date End Date Taking? Authorizing Provider  atorvastatin (LIPITOR) 10 MG tablet Take 1 tablet (10 mg total) by mouth daily at 6 PM. 07/07/16   [name], NP  HYDROcodone-acetaminophen (NORCO) 7.5-325 MG tablet Take 1 tablet by mouth 2 (two) times daily. 04/20/17   [name],  NP  lisinopril (PRINIVIL,ZESTRIL) 10 MG tablet TAKE ONE TABLET BY MOUTH DAILY 01/09/17   Wynetta Emery, Megan P, DO  raloxifene (EVISTA) 60 MG tablet Take 1 tablet (60 mg total) by mouth daily. 07/07/16   [name], NP  risperiDONE (RISPERDAL) 2 MG tablet Take 0.5-1 tablets (1-2 mg  total) by mouth at bedtime. 07/07/16   [name], NP  traMADol (ULTRAM) 50 MG tablet Take 1 tablet (50 mg total) by mouth every 6 (six) hours as needed. 05/29/17 06/01/17  Lannie Fields, PA-C    Allergies Patient has no known allergies.  Family History  Problem Relation Age of Onset  . Hyperlipidemia Mother   . Hypertension Mother     Social History Social History  Substance Use Topics  . Smoking status: Current Every Day Smoker    Packs/day: 1.00    Years: 43.00    Types: Cigarettes  . Smokeless tobacco: Former Systems developer    Types: Snuff  . Alcohol use No     Review of Systems  Constitutional: No fever/chills Eyes: No visual changes. No discharge ENT: No upper respiratory complaints. Cardiovascular: no chest pain. Respiratory: no cough. No SOB. Gastrointestinal: No abdominal pain.  No nausea, no vomiting.  No diarrhea.  No constipation. Genitourinary: Negative for dysuria. No hematuria Musculoskeletal: Patient has left hip pain.  Skin: Negative for rash, abrasions, lacerations, ecchymosis. Neurological: Negative for headaches, focal weakness or numbness.   ____________________________________________   PHYSICAL EXAM:  VITAL SIGNS: ED Triage Vitals  Enc Vitals Group     BP 05/29/17 1746 118/66     Pulse Rate 05/29/17 1746 92     Resp 05/29/17 1746 18     Temp 05/29/17 1746 99.1 F (37.3 C)     Temp Source 05/29/17 1746 Oral     SpO2 05/29/17 1746 100 %     Weight 05/29/17 1746 140 lb (63.5 kg)     Height 05/29/17 1746 5\' 5"  (1.651 m)     Head Circumference --      Peak Flow --      Pain Score 05/29/17 1750 5     Pain Loc --      Pain Edu? --      Excl. in [place]? --      Constitutional: Alert and oriented. Well appearing and in no acute distress. Eyes: Conjunctivae are normal. PERRL. EOMI. Head: Atraumatic. Cardiovascular: Normal rate, regular rhythm. Normal S1 and S2.  Good peripheral circulation. Respiratory: Normal respiratory effort without tachypnea  or retractions. Lungs CTAB. Good air entry to the bases with no decreased or absent breath sounds. Musculoskeletal: Patient has groin pain reproduced with internal and external rotation at the left hip. External and internal rotation at the left hip is limited. Palpable dorsalis pedis pulse bilaterally and symmetrically. Neurologic:  Normal speech and language. No gross focal neurologic deficits are appreciated.  Skin:  Skin is warm, dry and intact. No rash noted. Psychiatric: Mood and affect are normal. Speech and behavior are normal. Patient exhibits appropriate insight and judgement.   ____________________________________________   LABS (all labs ordered are listed, but only abnormal results are displayed)  Labs Reviewed - No data to display ____________________________________________  EKG   ____________________________________________  RADIOLOGY Unk Pinto, personally viewed and evaluated these images (plain radiographs) as part of my medical decision making, as well as reviewing the written report by the radiologist.    Dg Hip Unilat With Pelvis 2-3 Views Left  Result Date: 05/29/2017 CLINICAL DATA:  Left groin pain. EXAM: DG HIP (WITH OR WITHOUT PELVIS) 2-3V LEFT COMPARISON:  None. FINDINGS: Frontal pelvis shows normal SI joints and symphysis pubis. Joint space in the hips is preserved and symmetric. No substantial degenerative change in either hip. Coarse calcification over the central pelvis likely related to uterine fibroids. AP and frog-leg lateral views of the left hip show no evidence for femoral neck fracture. IMPRESSION: Negative. Electronically Signed   By: Misty Stanley M.D.   On: 05/29/2017 18:48    ____________________________________________    PROCEDURES  Procedure(s) performed:    Procedures    Medications - No data to display   ____________________________________________   INITIAL IMPRESSION / ASSESSMENT AND PLAN / ED COURSE  Pertinent  labs & imaging results that were available during my care of the patient were reviewed by me and considered in my medical decision making (see chart for details).  Review of the [place] CSRS was performed in accordance of the [place] prior to dispensing any controlled drugs.     Assessment and Plan: Left Hip Pain:  Patient presents to the emergency department with left hip pain reproduced with internal and external rotation at the left hip. X-ray examination in the emergency department was unremarkable for concerning features. She was observed ambulating without difficulty. Patient was discharged with tramadol and advised to follow-up with pain management as needed. Vital signs are reassuring prior to discharge. All patient questions were answered.    ____________________________________________  FINAL CLINICAL IMPRESSION(S) / ED DIAGNOSES  Final diagnoses:  Left hip pain      NEW MEDICATIONS STARTED DURING THIS VISIT:  New Prescriptions   TRAMADOL (ULTRAM) 50 MG TABLET    Take 1 tablet (50 mg total) by mouth every 6 (six) hours as needed.        This chart was dictated using voice recognition software/Dragon. Despite best efforts to proofread, errors can occur which can change the meaning. Any change was purely unintentional.    Lannie Fields, PA-C 05/29/17 1912    Schuyler Amor, MD 05/30/17 0001

## 2017-06-02 ENCOUNTER — Other Ambulatory Visit: Payer: Self-pay

## 2017-06-02 NOTE — Patient Outreach (Signed)
[name]) Care Management  06/02/2017  [name] July 20, 1951 [id number]     Telephone Screen  Referral Date: 06/02/17 Referral Source: [facility] ED Census Report/EMMI Prevent Call Referral Reason: "HTN, presented to ED on  Insurance: [facility] Medicare   Outreach attempt # 1 to patient. No answer at present and unable to leave voicemail message.     Plan: RN CM will make outreach attempt to patient within three business days.   Enzo Montgomery, RN,BSN,CCM [place] Management Telephonic Care Management Coordinator Direct Phone: [phone] Toll Free: [phone] Fax: [phone]

## 2017-06-03 ENCOUNTER — Ambulatory Visit (INDEPENDENT_AMBULATORY_CARE_PROVIDER_SITE_OTHER): Payer: Medicare Other | Admitting: Unknown Physician Specialty

## 2017-06-03 ENCOUNTER — Encounter: Payer: Self-pay | Admitting: Unknown Physician Specialty

## 2017-06-03 DIAGNOSIS — M76892 Other specified enthesopathies of left lower limb, excluding foot: Secondary | ICD-10-CM | POA: Diagnosis not present

## 2017-06-03 MED ORDER — METHYLPREDNISOLONE 4 MG PO TBPK: ORAL_TABLET | ORAL | 0 refills | Status: DC

## 2017-06-03 NOTE — Progress Notes (Signed)
   BP 112/75   Pulse 66   Temp 98.2 F (36.8 C)   Wt 129 lb (58.5 kg)   LMP  (LMP Unknown)   SpO2 100%   BMI 21.47 kg/m    Subjective:    Patient ID: Denise Everett, female    DOB: 1951/02/08, 66 y.o.   MRN: [id number]  HPI: Denise Everett is a 66 y.o. female  Chief Complaint  Patient presents with  . Hip Pain    pt states she has been having pain in her left hip since last Monday, states she went to the ER and that they told her it was her joints    Pt is complaining of pain in left groin.  Presented to the ER on 05/29/2017 without fracture noted.  Pain is worse when she walks.  She was taking Tramadol from the ER.  Going to the pain clinic but not receiving Hydrocodone.  Has an appointment set up with Orthopedics.    Relevant past medical, surgical, family and social history reviewed and updated as indicated. Interim medical history since our last visit reviewed. Allergies and medications reviewed and updated.  Review of Systems  Per HPI unless specifically indicated above     Objective:    BP 112/75   Pulse 66   Temp 98.2 F (36.8 C)   Wt 129 lb (58.5 kg)   LMP  (LMP Unknown)   SpO2 100%   BMI 21.47 kg/m   Wt Readings from Last 3 Encounters:  06/03/17 129 lb (58.5 kg)  05/29/17 140 lb (63.5 kg)  04/16/17 140 lb (63.5 kg)    Physical Exam  Constitutional: She is oriented to person, place, and time. She appears well-developed and well-nourished. No distress.  HENT:  Head: Normocephalic and atraumatic.  Eyes: Conjunctivae and lids are normal. Right eye exhibits no discharge. Left eye exhibits no discharge. No scleral icterus.  Cardiovascular: Normal rate.   Pulmonary/Chest: Effort normal.  Abdominal: Normal appearance. There is no splenomegaly or hepatomegaly.  Musculoskeletal:       Left hip: She exhibits decreased range of motion and decreased strength.  Pain with straight leg raise  Neurological: She is alert and oriented to person, place, and time.    Skin: Skin is intact. No rash noted. No pallor.  Psychiatric: She has a normal mood and affect. Her behavior is normal. Judgment and thought content normal.    Results for orders placed or performed in visit on 04/16/17  ToxASSURE Select 13 (MW), Urine  Result Value Ref Range   Summary FINAL       Assessment & Plan:   Problem List Items Addressed This Visit      Unprioritized   Hip tendonitis, left    Suspect problem is a tendonitis.  Cannot take NSAIDS with a GFR of 34.  Will rx a Medrol dose pack for relief.  Appointment pending for Orthopedics          Follow up plan: Return if symptoms worsen or fail to improve.

## 2017-06-03 NOTE — Assessment & Plan Note (Signed)
Suspect problem is a tendonitis.  Cannot take NSAIDS with a GFR of 34.  Will rx a Medrol dose pack for relief.  Appointment pending for Orthopedics

## 2017-06-05 ENCOUNTER — Other Ambulatory Visit: Payer: Self-pay

## 2017-06-05 NOTE — Patient Outreach (Signed)
[facility]) Care Management  06/05/2017  Denise Everett 05-15-1951 [id number]   Telephone Screen  Referral Date: 06/02/17 Referral Source: [facility] ED Census Report/EMMI Prevent Call Referral Reason: "HTN, presented to ED on  Insurance: [facility] Medicare     Outreach attempt #2 to patient. No answer at present and unable to leave message.    Plan: RN CM will make outreach attempt to patient within three business days.   Enzo Montgomery, RN,BSN,CCM [place] Management Telephonic Care Management Coordinator Direct Phone: [phone] Toll Free: [phone] Fax: [phone]

## 2017-06-09 ENCOUNTER — Other Ambulatory Visit: Payer: Self-pay

## 2017-06-09 ENCOUNTER — Telehealth: Payer: Self-pay | Admitting: Unknown Physician Specialty

## 2017-06-09 DIAGNOSIS — M25552 Pain in left hip: Secondary | ICD-10-CM | POA: Diagnosis not present

## 2017-06-09 NOTE — Patient Outreach (Addendum)
[facility]) Care Management  06/09/2017  Denise Everett [birth year] [id number]    Telephone Screen  Referral Date: 06/02/17 Referral Source: [facility] ED Census Report/EMMI Prevent Call Referral Reason: "HTN, presented to ED on 05/29/17" Insurance: [facility] Medicare    Outreach attempt #2 to patient. No answer.     Plan: RN CM will send unsuccessful outreach letter to patient and close case if no response from patient within 10 business days.   Enzo Montgomery, RN,BSN,CCM [place] Management Telephonic Care Management Coordinator Direct Phone: [phone] Toll Free: [phone] Fax: [phone]

## 2017-06-09 NOTE — Telephone Encounter (Signed)
Routing to provider  

## 2017-06-09 NOTE — Telephone Encounter (Signed)
Patient states she is going to have to get MRI so she will not be given anything for her pain. She would like for [facility] to send in the pain medication she had her on before this last one as it did not help with her pain.   [place] [phone]  Thanks

## 2017-06-10 MED ORDER — TRAMADOL HCL 50 MG PO TABS: 50.0000 mg | ORAL_TABLET | Freq: Three times a day (TID) | ORAL | 0 refills | Status: DC | PRN

## 2017-06-10 NOTE — Telephone Encounter (Signed)
Called and spoke to patient. I explained to the patient that Denise Everett cannot keep writing her controlled substances because she is seeing pain management. Patient wants to know if she can have anything at all for her pain because she states she can barely walk. I told the patient that I wasn't sure if Denise Everett could write for anything else but that I would ask her.

## 2017-06-10 NOTE — Telephone Encounter (Signed)
She is under the care of pain management and I cannot

## 2017-06-10 NOTE — Telephone Encounter (Signed)
Tried calling patient, got a busy signal. Will try to call again after lunch.

## 2017-06-10 NOTE — Telephone Encounter (Signed)
Tried calling patient again, still got a busy signal. Will try again shortly.

## 2017-06-10 NOTE — Telephone Encounter (Signed)
Prescription faxed to [facility].

## 2017-06-10 NOTE — Telephone Encounter (Signed)
She was given Tramadol in the ER.  I could refill this if it helped.

## 2017-06-10 NOTE — Telephone Encounter (Signed)
Called and spoke to patient. She states that the tramadol did not help her much but she would like to try them again. She would like the prescription sent in to [facility]. I told her that we would send it in shortly.

## 2017-06-12 ENCOUNTER — Other Ambulatory Visit: Payer: Self-pay | Admitting: Orthopedic Surgery

## 2017-06-12 DIAGNOSIS — M25552 Pain in left hip: Secondary | ICD-10-CM

## 2017-06-19 ENCOUNTER — Ambulatory Visit
Admission: RE | Admit: 2017-06-19 | Discharge: 2017-06-19 | Disposition: A | Discharge status: Home / Self Care | Payer: Medicare Other | Source: Ambulatory Visit | Attending: Orthopedic Surgery | Admitting: Orthopedic Surgery

## 2017-06-23 ENCOUNTER — Other Ambulatory Visit: Payer: Self-pay

## 2017-06-23 NOTE — Patient Outreach (Signed)
[facility]) Care Management  06/23/2017  Denise Everett 03/25/1951 [id number]   Telephone Screen  Referral Date: 06/02/17 Referral Source: [facility] ED Census Report/EMMI Prevent Call Referral Reason: "HTN, presented to ED on 05/29/17" Insurance: [facility]   Multiple attempts to establish contact with patient without success. No response from letter mailed to patient. Case is being closed at this time.    Plan: RN CM will notify [facility] administrative assistant of case status. RN CM will send MD case closure letter.   Enzo [place], RN,BSN,CCM [place] [facility] Telephonic Care Management Coordinator Direct Phone: [phone] Toll Free: [phone] Fax: [phone]

## 2017-07-14 ENCOUNTER — Telehealth: Payer: Self-pay | Admitting: Oncology

## 2017-07-14 NOTE — Telephone Encounter (Signed)
Rsch & conf with patient for 08/05/17 (date and time per pt) per, Dr Woodfin Ganja on [facility] 07/22/17. MF

## 2017-07-16 ENCOUNTER — Other Ambulatory Visit: Payer: Self-pay | Admitting: Unknown Physician Specialty

## 2017-07-16 ENCOUNTER — Other Ambulatory Visit: Payer: Self-pay | Admitting: Family Medicine

## 2017-07-21 DIAGNOSIS — D472 Monoclonal gammopathy: Secondary | ICD-10-CM | POA: Diagnosis not present

## 2017-07-21 DIAGNOSIS — N183 Chronic kidney disease, stage 3 (moderate): Secondary | ICD-10-CM | POA: Diagnosis not present

## 2017-07-21 DIAGNOSIS — I10 Essential (primary) hypertension: Secondary | ICD-10-CM | POA: Diagnosis not present

## 2017-07-21 DIAGNOSIS — E872 Acidosis: Secondary | ICD-10-CM | POA: Diagnosis not present

## 2017-07-21 LAB — HEPATIC FUNCTION PANEL
ALT: 5 — AB (ref 7–35)
AST: 13 (ref 13–35)
Alkaline Phosphatase: 75 (ref 25–125)
Bilirubin, Total: 0.2

## 2017-07-21 LAB — BASIC METABOLIC PANEL
BUN: 9 (ref 4–21)
CREATININE: 1.6 — AB (ref 0.5–1.1)
GLUCOSE: 101
POTASSIUM: 3.9 (ref 3.4–5.3)
Sodium: 136 — AB (ref 137–147)

## 2017-07-21 LAB — CBC AND DIFFERENTIAL
HCT: 34 — AB (ref 36–46)
Hemoglobin: 11 — AB (ref 12.0–16.0)
Platelets: 83 — AB (ref 150–399)
WBC: 5.5

## 2017-07-22 ENCOUNTER — Ambulatory Visit: Payer: Medicare Other | Admitting: Oncology

## 2017-07-22 ENCOUNTER — Other Ambulatory Visit: Payer: Medicare Other

## 2017-08-04 NOTE — Progress Notes (Signed)
[place]  [phone] [phone]  ID: [name] OB: 11-08-1951  MR#: [id number]  VVO#:[id number]  Patient Care Team: [name], NP as PCP - General (Nurse Practitioner)  CHIEF COMPLAINT: MGUS and ITP  INTERVAL HISTORY: Patient returns to clinic today for routine 6 month evaluation and laboratory work. She has had unintentional weight loss over the past several months, but otherwise feels well and is asymptomatic. She denies fevers or night sweats. She does not complain of easy bleeding or bruising today.  She has no neurologic complaints. She denies any chest pain or shortness of breath. She denies any nausea, vomiting, constipation, or diarrhea.  She has no melena or hematochezia.  She has no urinary complaints.  Patient offers no further specific complaints today.  REVIEW OF SYSTEMS:   Review of Systems  Constitutional: Positive for weight loss. Negative for fever and malaise/fatigue.  Respiratory: Negative.  Negative for cough and shortness of breath.   Cardiovascular: Negative.  Negative for chest pain and leg swelling.  Gastrointestinal: Negative.  Negative for abdominal pain.  Musculoskeletal: Negative.   Neurological: Negative.  Negative for weakness.  Endo/Heme/Allergies: Does not bruise/bleed easily.  Psychiatric/Behavioral: The patient is not nervous/anxious.     As per HPI. Otherwise, a complete review of systems is negative.  PAST MEDICAL HISTORY: Past Medical History:  Diagnosis Date  . Abnormal weight loss 06/07/2015  . Allergy   . Cataract   . Chronic kidney disease   . Chronic pain syndrome   . Hyperlipidemia   . Hypertension   . Leg pain   . Osteoporosis    hips  . Renal insufficiency   . Schizophrenia ([place])   . Thrombocytopenia ([place])   . Tobacco use disorder     PAST SURGICAL HISTORY: Past Surgical History:  Procedure Laterality Date  . ankle surgery Right    x's 2  . CATARACT EXTRACTION       FAMILY HISTORY Family History  Problem Relation Age of Onset  . Hyperlipidemia Mother   . Hypertension Mother        ADVANCED DIRECTIVES:    HEALTH MAINTENANCE: Social History  Substance Use Topics  . Smoking status: Current Every Day Smoker    Packs/day: 1.00    Years: 43.00    Types: Cigarettes  . Smokeless tobacco: Former Systems developer    Types: Snuff  . Alcohol use No     Colonoscopy:  PAP:  Bone density:  Lipid panel:  Allergies  Allergen Reactions  . No Known Allergies     Current Outpatient Prescriptions  Medication Sig Dispense Refill  . atorvastatin (LIPITOR) 10 MG tablet Take 1 tablet (10 mg total) by mouth daily at 6 PM. 90 tablet 3  . lisinopril (PRINIVIL,ZESTRIL) 10 MG tablet TAKE 1 TABLET BY MOUTH EVERY DAY 90 tablet 0  . raloxifene (EVISTA) 60 MG tablet Take 1 tablet (60 mg total) by mouth daily. 90 tablet 3  . risperiDONE (RISPERDAL) 2 MG tablet TAKE ONE-HALF TO ONE TABLET BY MOUTH AT BEDTIME 90 tablet 0   No current facility-administered medications for this visit.     OBJECTIVE: Vitals:   08/05/17 1126  BP: 105/69  Pulse: (!) 56  Temp: (!) 97.1 F (36.2 C)     Body mass index is 20.34 kg/m.    ECOG FS:0 - Asymptomatic  General: Well-developed, well-nourished, no acute distress. Eyes: Pink conjunctiva, anicteric sclera. Lungs: Clear to auscultation bilaterally. Heart: Regular rate and rhythm. No rubs, murmurs, or  gallops. Abdomen: Soft, nontender, nondistended. No organomegaly noted, normoactive bowel sounds. Musculoskeletal: No edema, cyanosis, or clubbing. Neuro: Alert, answering all questions appropriately. Cranial nerves grossly intact. Skin: No rashes or petechiae noted. Occasional ecchymoses noted. Psych: Normal affect.   LAB RESULTS:  Lab Results  Component Value Date   NA 131 (L) 03/10/2017   K 3.8 03/10/2017   CL 106 03/10/2017   CO2 18 (L) 03/10/2017   GLUCOSE 109 (H) 03/10/2017   BUN 12 03/10/2017   CREATININE 1.72 (H)  03/10/2017   CALCIUM 8.8 (L) 03/10/2017   PROT 6.9 03/10/2017   ALBUMIN 3.9 03/10/2017   AST 18 03/10/2017   ALT 6 (L) 03/10/2017   ALKPHOS 55 03/10/2017   BILITOT 0.8 03/10/2017   GFRNONAA 30 (L) 03/10/2017   GFRAA 34 (L) 03/10/2017    Lab Results  Component Value Date   WBC 5.6 01/14/2017   NEUTROABS 3.6 01/14/2017   HGB 11.6 (L) 01/14/2017   HCT 33.9 (L) 01/14/2017   MCV 89.6 01/14/2017   PLT PLATELET CLUMPS NOTED ON SMEAR, UNABLE TO ESTIMATE 03/10/2017     STUDIES: No results found.  ASSESSMENT: MGUS and ITP.  PLAN:    1.  ITP: Previously, bone marrow biopsy confirmed the results. Patient's platelet count is decreased, but stable. Today's specimen was clotted, therefore no results were given. Patient has declined returning to the lab for repeat bloodwork. She does not require treatment at this time. She last received Rituxan in September 2013. Return to clinic in 1 months for laboratory work and further evaluation. Of note, patient had a response to prednisone but it was not durable.    2. MGUS:  Bone marrow biopsy revealed less than 5% plasma cells.  His recent M spike was stable at 0.7. Continue to monitor SIEP once per year. 3. Weight loss: Patient's weight continues to decline. Return to clinic in 1 month as above. If she continues to lose weight, will consider imaging to further evaluate.  4. Smoking history: Patient underwent CT screening of the chest on February 03, 2017.  Patient expressed understanding and was in agreement with this plan. She also understands that She can call clinic at any time with any questions, concerns, or complaints.    Lloyd Huger, MD   08/09/2017 8:27 AM

## 2017-08-05 ENCOUNTER — Inpatient Hospital Stay: Payer: Medicare Other | Attending: Oncology

## 2017-08-05 ENCOUNTER — Encounter: Payer: Self-pay | Admitting: Oncology

## 2017-08-05 ENCOUNTER — Inpatient Hospital Stay (HOSPITAL_BASED_OUTPATIENT_CLINIC_OR_DEPARTMENT_OTHER): Payer: Medicare Other | Admitting: Oncology

## 2017-08-05 VITALS — BP 105/69 | HR 56 | Temp 97.1°F | Wt 122.2 lb

## 2017-08-05 DIAGNOSIS — D696 Thrombocytopenia, unspecified: Secondary | ICD-10-CM | POA: Insufficient documentation

## 2017-08-05 DIAGNOSIS — M810 Age-related osteoporosis without current pathological fracture: Secondary | ICD-10-CM | POA: Insufficient documentation

## 2017-08-05 DIAGNOSIS — N189 Chronic kidney disease, unspecified: Secondary | ICD-10-CM | POA: Insufficient documentation

## 2017-08-05 DIAGNOSIS — R634 Abnormal weight loss: Secondary | ICD-10-CM | POA: Diagnosis not present

## 2017-08-05 DIAGNOSIS — D472 Monoclonal gammopathy: Secondary | ICD-10-CM

## 2017-08-05 DIAGNOSIS — D693 Immune thrombocytopenic purpura: Secondary | ICD-10-CM | POA: Insufficient documentation

## 2017-08-05 DIAGNOSIS — F209 Schizophrenia, unspecified: Secondary | ICD-10-CM

## 2017-08-05 DIAGNOSIS — I129 Hypertensive chronic kidney disease with stage 1 through stage 4 chronic kidney disease, or unspecified chronic kidney disease: Secondary | ICD-10-CM | POA: Insufficient documentation

## 2017-08-05 DIAGNOSIS — F17210 Nicotine dependence, cigarettes, uncomplicated: Secondary | ICD-10-CM

## 2017-08-05 DIAGNOSIS — Z79899 Other long term (current) drug therapy: Secondary | ICD-10-CM | POA: Insufficient documentation

## 2017-08-05 DIAGNOSIS — E785 Hyperlipidemia, unspecified: Secondary | ICD-10-CM

## 2017-08-06 LAB — PROTEIN ELECTROPHORESIS, SERUM
A/G Ratio: 1.1 (ref 0.7–1.7)
ALBUMIN ELP: 3.1 g/dL (ref 2.9–4.4)
ALPHA-1-GLOBULIN: 0.2 g/dL (ref 0.0–0.4)
ALPHA-2-GLOBULIN: 0.6 g/dL (ref 0.4–1.0)
BETA GLOBULIN: 0.7 g/dL (ref 0.7–1.3)
GAMMA GLOBULIN: 1.1 g/dL (ref 0.4–1.8)
Globulin, Total: 2.7 g/dL (ref 2.2–3.9)
M-Spike, %: 0.7 g/dL — ABNORMAL HIGH
Total Protein ELP: 5.8 g/dL — ABNORMAL LOW (ref 6.0–8.5)

## 2017-08-06 LAB — IGG, IGA, IGM
IgA: 73 mg/dL — ABNORMAL LOW (ref 87–352)
IgG (Immunoglobin G), Serum: 1041 mg/dL (ref 700–1600)
IgM (Immunoglobulin M), Srm: 102 mg/dL (ref 26–217)

## 2017-08-06 LAB — KAPPA/LAMBDA LIGHT CHAINS
KAPPA, LAMDA LIGHT CHAIN RATIO: 1.36 (ref 0.26–1.65)
Kappa free light chain: 60.2 mg/L — ABNORMAL HIGH (ref 3.3–19.4)
Lambda free light chains: 44.4 mg/L — ABNORMAL HIGH (ref 5.7–26.3)

## 2017-08-11 ENCOUNTER — Other Ambulatory Visit: Payer: Self-pay | Admitting: Unknown Physician Specialty

## 2017-09-02 ENCOUNTER — Inpatient Hospital Stay: Payer: Medicare Other | Admitting: Oncology

## 2017-09-02 ENCOUNTER — Inpatient Hospital Stay: Payer: Medicare Other

## 2017-09-09 ENCOUNTER — Ambulatory Visit: Payer: Medicare Other

## 2017-09-16 ENCOUNTER — Ambulatory Visit (INDEPENDENT_AMBULATORY_CARE_PROVIDER_SITE_OTHER): Payer: Medicare Other

## 2017-09-16 VITALS — BP 131/81 | HR 51 | Temp 98.4°F | Resp 18 | Ht 62.0 in | Wt 128.6 lb

## 2017-09-16 DIAGNOSIS — Z0000 Encounter for general adult medical examination without abnormal findings: Secondary | ICD-10-CM | POA: Diagnosis not present

## 2017-09-16 DIAGNOSIS — Z23 Encounter for immunization: Secondary | ICD-10-CM

## 2017-09-16 DIAGNOSIS — Z1211 Encounter for screening for malignant neoplasm of colon: Secondary | ICD-10-CM | POA: Diagnosis not present

## 2017-09-16 NOTE — Patient Instructions (Signed)
Denise Everett , Thank you for taking time to come for your Medicare Wellness Visit. I appreciate your ongoing commitment to your health goals. Please review the following plan we discussed and let me know if I can assist you in the future.   Screening recommendations/referrals: Colonoscopy: due now Mammogram: completed 10/21/2016 Bone Density: completed 04/17/2010 Recommended yearly ophthalmology/optometry visit for glaucoma screening and checkup Recommended yearly dental visit for hygiene and checkup  Vaccinations: Influenza vaccine: done today Pneumococcal vaccine: up to date Tdap vaccine: up to date Shingles vaccine: due, check with your insurance company for coverage    Advanced directives: Advance directive discussed with you today. I have provided a copy for you to complete at home and have notarized. Once this is complete please bring a copy in to our office so we can scan it into your chart.  Conditions/risks identified: recommend to decrease soda intake, increase water to 6-7 glasses of water a day   Next appointment: Follow up on 09/23/2017 at 10:00am with Regino Schultze. Follow up in one year for your annual wellness exam.    Preventive Care 65 Years and Older, Female Preventive care refers to lifestyle choices and visits with your health care provider that can promote health and wellness. What does preventive care include?  A yearly physical exam. This is also called an annual well check.  Dental exams once or twice a year.  Routine eye exams. Ask your health care provider how often you should have your eyes checked.  Personal lifestyle choices, including:  Daily care of your teeth and gums.  Regular physical activity.  Eating a healthy diet.  Avoiding tobacco and drug use.  Limiting alcohol use.  Practicing safe sex.  Taking low-dose aspirin every day.  Taking vitamin and mineral supplements as recommended by your health care provider. What happens during an  annual well check? The services and screenings done by your health care provider during your annual well check will depend on your age, overall health, lifestyle risk factors, and family history of disease. Counseling  Your health care provider may ask you questions about your:  Alcohol use.  Tobacco use.  Drug use.  Emotional well-being.  Home and relationship well-being.  Sexual activity.  Eating habits.  History of falls.  Memory and ability to understand (cognition).  Work and work Statistician.  Reproductive health. Screening  You may have the following tests or measurements:  Height, weight, and BMI.  Blood pressure.  Lipid and cholesterol levels. These may be checked every 5 years, or more frequently if you are over 34 years old.  Skin check.  Lung cancer screening. You may have this screening every year starting at age 71 if you have a 30-pack-year history of smoking and currently smoke or have quit within the past 15 years.  Fecal occult blood test (FOBT) of the stool. You may have this test every year starting at age 27.  Flexible sigmoidoscopy or colonoscopy. You may have a sigmoidoscopy every 5 years or a colonoscopy every 10 years starting at age 75.  Hepatitis C blood test.  Hepatitis B blood test.  Sexually transmitted disease (STD) testing.  Diabetes screening. This is done by checking your blood sugar (glucose) after you have not eaten for a while (fasting). You may have this done every 1-3 years.  Bone density scan. This is done to screen for osteoporosis. You may have this done starting at age 35.  Mammogram. This may be done every 1-2 years. Talk to  your health care provider about how often you should have regular mammograms. Talk with your health care provider about your test results, treatment options, and if necessary, the need for more tests. Vaccines  Your health care provider may recommend certain vaccines, such as:  Influenza  vaccine. This is recommended every year.  Tetanus, diphtheria, and acellular pertussis (Tdap, Td) vaccine. You may need a Td booster every 10 years.  Zoster vaccine. You may need this after age 45.  Pneumococcal 13-valent conjugate (PCV13) vaccine. One dose is recommended after age 55.  Pneumococcal polysaccharide (PPSV23) vaccine. One dose is recommended after age 22. Talk to your health care provider about which screenings and vaccines you need and how often you need them. This information is not intended to replace advice given to you by your health care provider. Make sure you discuss any questions you have with your health care provider. Document Released: 11/23/2015 Document Revised: 07/16/2016 Document Reviewed: 08/28/2015 Elsevier Interactive Patient Education  2017 [place] Prevention in the Home Falls can cause injuries. They can happen to people of all ages. There are many things you can do to make your home safe and to help prevent falls. What can I do on the outside of my home?  Regularly fix the edges of walkways and driveways and fix any cracks.  Remove anything that might make you trip as you walk through a door, such as a raised step or threshold.  Trim any bushes or trees on the path to your home.  Use bright outdoor lighting.  Clear any walking paths of anything that might make someone trip, such as rocks or tools.  Regularly check to see if handrails are loose or broken. Make sure that both sides of any steps have handrails.  Any raised decks and porches should have guardrails on the edges.  Have any leaves, snow, or ice cleared regularly.  Use sand or salt on walking paths during winter.  Clean up any spills in your garage right away. This includes oil or grease spills. What can I do in the bathroom?  Use night lights.  Install grab bars by the toilet and in the tub and shower. Do not use towel bars as grab bars.  Use non-skid mats or decals  in the tub or shower.  If you need to sit down in the shower, use a plastic, non-slip stool.  Keep the floor dry. Clean up any water that spills on the floor as soon as it happens.  Remove soap buildup in the tub or shower regularly.  Attach bath mats securely with double-sided non-slip rug tape.  Do not have throw rugs and other things on the floor that can make you trip. What can I do in the bedroom?  Use night lights.  Make sure that you have a light by your bed that is easy to reach.  Do not use any sheets or blankets that are too big for your bed. They should not hang down onto the floor.  Have a firm chair that has side arms. You can use this for support while you get dressed.  Do not have throw rugs and other things on the floor that can make you trip. What can I do in the kitchen?  Clean up any spills right away.  Avoid walking on wet floors.  Keep items that you use a lot in easy-to-reach places.  If you need to reach something above you, use a strong step stool that has  a grab bar.  Keep electrical cords out of the way.  Do not use floor polish or wax that makes floors slippery. If you must use wax, use non-skid floor wax.  Do not have throw rugs and other things on the floor that can make you trip. What can I do with my stairs?  Do not leave any items on the stairs.  Make sure that there are handrails on both sides of the stairs and use them. Fix handrails that are broken or loose. Make sure that handrails are as long as the stairways.  Check any carpeting to make sure that it is firmly attached to the stairs. Fix any carpet that is loose or worn.  Avoid having throw rugs at the top or bottom of the stairs. If you do have throw rugs, attach them to the floor with carpet tape.  Make sure that you have a light switch at the top of the stairs and the bottom of the stairs. If you do not have them, ask someone to add them for you. What else can I do to help  prevent falls?  Wear shoes that:  Do not have high heels.  Have rubber bottoms.  Are comfortable and fit you well.  Are closed at the toe. Do not wear sandals.  If you use a stepladder:  Make sure that it is fully opened. Do not climb a closed stepladder.  Make sure that both sides of the stepladder are locked into place.  Ask someone to hold it for you, if possible.  Clearly mark and make sure that you can see:  Any grab bars or handrails.  First and last steps.  Where the edge of each step is.  Use tools that help you move around (mobility aids) if they are needed. These include:  Canes.  Walkers.  Scooters.  Crutches.  Turn on the lights when you go into a dark area. Replace any light bulbs as soon as they burn out.  Set up your furniture so you have a clear path. Avoid moving your furniture around.  If any of your floors are uneven, fix them.  If there are any pets around you, be aware of where they are.  Review your medicines with your doctor. Some medicines can make you feel dizzy. This can increase your chance of falling. Ask your doctor what other things that you can do to help prevent falls. This information is not intended to replace advice given to you by your health care provider. Make sure you discuss any questions you have with your health care provider. Document Released: 08/23/2009 Document Revised: 04/03/2016 Document Reviewed: 12/01/2014 Elsevier Interactive Patient Education  2017 [employer].

## 2017-09-16 NOTE — Progress Notes (Signed)
Subjective:   Denise Everett is a 66 y.o. female who presents for Medicare Annual (Subsequent) preventive examination.  Review of Systems:  Cardiac Risk Factors include: dyslipidemia;advanced age (>46men, >25 women);sedentary lifestyle;smoking/ tobacco exposure     Objective:     Vitals: BP 131/81 (BP Location: Left Arm, Patient Position: Sitting)   Pulse (!) 51   Temp 98.4 F (36.9 C) (Temporal)   Resp 18   Ht 5\' 2"  (1.575 m)   Wt 128 lb 9.6 oz (58.3 kg)   LMP  (LMP Unknown)   BMI 23.52 kg/m   Body mass index is 23.52 kg/m.   Tobacco Social History   Tobacco Use  Smoking Status Former Smoker  . Packs/day: 1.00  . Years: 43.00  . Pack years: 43.00  . Types: Cigarettes  . Last attempt to quit: 07/21/2017  . Years since quitting: 0.1  Smokeless Tobacco Former Systems developer  . Types: Snuff     Counseling given: Not Answered   Past Medical History:  Diagnosis Date  . Abnormal weight loss 06/07/2015  . Allergy   . Cataract   . Chronic kidney disease   . Chronic pain syndrome   . Hyperlipidemia   . Hypertension   . Leg pain   . Osteoporosis    hips  . Renal insufficiency   . Schizophrenia ([place])   . Thrombocytopenia ([place])   . Tobacco use disorder    Past Surgical History:  Procedure Laterality Date  . ankle surgery Right    x's 2  . CATARACT EXTRACTION     Family History  Problem Relation Age of Onset  . Hyperlipidemia Mother   . Hypertension Mother    Social History   Substance and Sexual Activity  Sexual Activity Yes    Outpatient Encounter Medications as of 09/16/2017  Medication Sig  . acetaminophen (TYLENOL) 325 MG tablet Take 650 mg every 6 (six) hours as needed by mouth.  . Aspirin-Acetaminophen-Caffeine (GOODYS EXTRA STRENGTH) [id number] MG PACK Take by mouth.  Marland Kitchen atorvastatin (LIPITOR) 10 MG tablet TAKE ONE TABLET BY MOUTH DAILY AT 6PM  . lisinopril (PRINIVIL,ZESTRIL) 10 MG tablet TAKE 1 TABLET BY MOUTH EVERY DAY  . raloxifene (EVISTA) 60 MG  tablet TAKE ONE TABLET BY MOUTH DAILY  . risperiDONE (RISPERDAL) 2 MG tablet TAKE ONE-HALF TO ONE TABLET BY MOUTH AT BEDTIME   No facility-administered encounter medications on file as of 09/16/2017.     Activities of Daily Living In your present state of health, do you have any difficulty performing the following activities: 09/16/2017  Hearing? N  Vision? N  Difficulty concentrating or making decisions? N  Walking or climbing stairs? N  Dressing or bathing? N  Doing errands, shopping? N  Preparing Food and eating ? N  Using the Toilet? N  In the past six months, have you accidently leaked urine? N  Do you have problems with loss of bowel control? N  Managing your Medications? N  Managing your Finances? Y  Housekeeping or managing your Housekeeping? N  Some recent data might be hidden    Patient Care Team: [name], NP as PCP - General (Nurse Practitioner) Lloyd Huger, MD as Consulting Physician (Oncology)    Assessment:     Exercise Activities and Dietary recommendations Current Exercise Habits: The patient does not participate in regular exercise at present, Exercise limited by: None identified  Goals    None     Fall Risk Fall Risk  09/16/2017 04/16/2017 02/24/2017 07/07/2016  Falls in the past year? No Yes No No  Number falls in past yr: - 1 - -  Injury with Fall? - Yes - -  Comment - bruised left knee.  had xray - -  Risk for fall due to : - (No Data) - -  Risk for fall due to: Comment - fell off of  he bed - -  Follow up - Falls prevention discussed - -   Depression Screen PHQ 2/9 Scores 09/16/2017 04/16/2017 02/24/2017 07/07/2016  PHQ - 2 Score 1 0 0 0  PHQ- 9 Score 2 - - -     Cognitive Function     6CIT Screen 09/16/2017  What Year? 0 points  What month? 0 points  What time? 0 points  Count back from 20 0 points  Months in reverse 0 points  Repeat phrase 8 points  Total Score 8    Immunization History  Administered Date(s) Administered    . Influenza, High Dose Seasonal PF 08/08/2016, 09/16/2017  . Influenza,inj,Quad PF,6+ Mos 01/01/2016  . Pneumococcal Conjugate-13 10/16/2014  . Pneumococcal Polysaccharide-23 01/01/2016  . Td 04/15/1999, 07/26/2009   Screening Tests Health Maintenance  Topic Date Due  . COLONOSCOPY  08/14/2013  . MAMMOGRAM  10/21/2018  . TETANUS/TDAP  07/27/2019  . INFLUENZA VACCINE  Completed  . DEXA SCAN  Completed  . Hepatitis C Screening  Completed  . PNA vac Low Risk Adult  Completed      Plan:     I have personally reviewed and addressed the Medicare Annual Wellness questionnaire and have noted the following in the patient's chart:  A. Medical and social history B. Use of alcohol, tobacco or illicit drugs  C. Current medications and supplements D. Functional ability and status E.  Nutritional status F.  Physical activity G. Advance directives H. List of other physicians I.  Hospitalizations, surgeries, and ER visits in previous 12 months J.  [place] such as hearing and vision if needed, cognitive and depression L. Referrals and appointments   In addition, I have reviewed and discussed with patient certain preventive protocols, quality metrics, and best practice recommendations. A written personalized care plan for preventive services as well as general preventive health recommendations were provided to patient.   Signed,  Tyler Aas, LPN Nurse Health Advisor   Nurse Notes:none

## 2017-09-20 NOTE — Progress Notes (Deleted)
[place]  [phone] Fax:(336) [phone]  ID: Denise Everett OB: Feb 25, 1951  MR#: [id number]  UYQ#:[id number]  Patient Care Team: [name], NP as PCP - General (Nurse Practitioner) Lloyd Huger, MD as Consulting Physician (Oncology)  CHIEF COMPLAINT: MGUS and ITP  INTERVAL HISTORY: Patient returns to clinic today for routine 6 month evaluation and laboratory work. She has had unintentional weight loss over the past several months, but otherwise feels well and is asymptomatic. She denies fevers or night sweats. She does not complain of easy bleeding or bruising today.  She has no neurologic complaints. She denies any chest pain or shortness of breath. She denies any nausea, vomiting, constipation, or diarrhea.  She has no melena or hematochezia.  She has no urinary complaints.  Patient offers no further specific complaints today.  REVIEW OF SYSTEMS:   Review of Systems  Constitutional: Positive for weight loss. Negative for fever and malaise/fatigue.  Respiratory: Negative.  Negative for cough and shortness of breath.   Cardiovascular: Negative.  Negative for chest pain and leg swelling.  Gastrointestinal: Negative.  Negative for abdominal pain.  Musculoskeletal: Negative.   Neurological: Negative.  Negative for weakness.  Endo/Heme/Allergies: Does not bruise/bleed easily.  Psychiatric/Behavioral: The patient is not nervous/anxious.     As per HPI. Otherwise, a complete review of systems is negative.  PAST MEDICAL HISTORY: Past Medical History:  Diagnosis Date  . Abnormal weight loss 06/07/2015  . Allergy   . Cataract   . Chronic kidney disease   . Chronic pain syndrome   . Hyperlipidemia   . Hypertension   . Leg pain   . Osteoporosis    hips  . Renal insufficiency   . Schizophrenia ([place])   . Thrombocytopenia ([place])   . Tobacco use disorder     PAST SURGICAL HISTORY: Past Surgical History:  Procedure Laterality Date  .  ankle surgery Right    x's 2  . CATARACT EXTRACTION      FAMILY HISTORY Family History  Problem Relation Age of Onset  . Hyperlipidemia Mother   . Hypertension Mother        ADVANCED DIRECTIVES:    HEALTH MAINTENANCE: Social History   Tobacco Use  . Smoking status: Former Smoker    Packs/day: 1.00    Years: 43.00    Pack years: 43.00    Types: Cigarettes    Last attempt to quit: 07/21/2017    Years since quitting: 0.1  . Smokeless tobacco: Former Systems developer    Types: Snuff  Substance Use Topics  . Alcohol use: No  . Drug use: No     Colonoscopy:  PAP:  Bone density:  Lipid panel:  Allergies  Allergen Reactions  . No Known Allergies     Current Outpatient Medications  Medication Sig Dispense Refill  . acetaminophen (TYLENOL) 325 MG tablet Take 650 mg every 6 (six) hours as needed by mouth.    . Aspirin-Acetaminophen-Caffeine (GOODYS EXTRA STRENGTH) [phone] MG PACK Take by mouth.    Marland Kitchen atorvastatin (LIPITOR) 10 MG tablet TAKE ONE TABLET BY MOUTH DAILY AT 6PM 90 tablet 1  . lisinopril (PRINIVIL,ZESTRIL) 10 MG tablet TAKE 1 TABLET BY MOUTH EVERY DAY 90 tablet 0  . raloxifene (EVISTA) 60 MG tablet TAKE ONE TABLET BY MOUTH DAILY 90 tablet 1  . risperiDONE (RISPERDAL) 2 MG tablet TAKE ONE-HALF TO ONE TABLET BY MOUTH AT BEDTIME 90 tablet 0   No current facility-administered medications for this visit.  OBJECTIVE: There were no vitals filed for this visit.   There is no height or weight on file to calculate BMI.    ECOG FS:0 - Asymptomatic  General: Well-developed, well-nourished, no acute distress. Eyes: Pink conjunctiva, anicteric sclera. Lungs: Clear to auscultation bilaterally. Heart: Regular rate and rhythm. No rubs, murmurs, or gallops. Abdomen: Soft, nontender, nondistended. No organomegaly noted, normoactive bowel sounds. Musculoskeletal: No edema, cyanosis, or clubbing. Neuro: Alert, answering all questions appropriately. Cranial nerves grossly  intact. Skin: No rashes or petechiae noted. Occasional ecchymoses noted. Psych: Normal affect.   LAB RESULTS:  Lab Results  Component Value Date   NA 136 (A) 07/21/2017   K 3.9 07/21/2017   CL 106 03/10/2017   CO2 18 (L) 03/10/2017   GLUCOSE 109 (H) 03/10/2017   BUN 9 07/21/2017   CREATININE 1.6 (A) 07/21/2017   CALCIUM 8.8 (L) 03/10/2017   PROT 6.9 03/10/2017   ALBUMIN 3.9 03/10/2017   AST 13 07/21/2017   ALT 5 (A) 07/21/2017   ALKPHOS 75 07/21/2017   BILITOT 0.8 03/10/2017   GFRNONAA 30 (L) 03/10/2017   GFRAA 34 (L) 03/10/2017    Lab Results  Component Value Date   WBC 5.5 07/21/2017   NEUTROABS 3.6 01/14/2017   HGB 11.0 (A) 07/21/2017   HCT 34 (A) 07/21/2017   MCV 89.6 01/14/2017   PLT 83 (A) 07/21/2017     STUDIES: No results found.  ASSESSMENT: MGUS and ITP.  PLAN:    1.  ITP: Previously, bone marrow biopsy confirmed the results. Patient's platelet count is decreased, but stable. Today's specimen was clotted, therefore no results were given. Patient has declined returning to the lab for repeat bloodwork. She does not require treatment at this time. She last received Rituxan in September 2013. Return to clinic in 1 months for laboratory work and further evaluation. Of note, patient had a response to prednisone but it was not durable.    2. MGUS:  Bone marrow biopsy revealed less than 5% plasma cells.  His recent M spike was stable at 0.7. Continue to monitor SIEP once per year. 3. Weight loss: Patient's weight continues to decline. Return to clinic in 1 month as above. If she continues to lose weight, will consider imaging to further evaluate.  4. Smoking history: Patient underwent CT screening of the chest on February 03, 2017.  Patient expressed understanding and was in agreement with this plan. She also understands that She can call clinic at any time with any questions, concerns, or complaints.    Lloyd Huger, MD   09/20/2017 10:52 AM

## 2017-09-22 ENCOUNTER — Inpatient Hospital Stay: Payer: Medicare Other | Admitting: Oncology

## 2017-09-22 ENCOUNTER — Inpatient Hospital Stay: Payer: Medicare Other

## 2017-09-23 ENCOUNTER — Ambulatory Visit (INDEPENDENT_AMBULATORY_CARE_PROVIDER_SITE_OTHER): Payer: Medicare Other | Admitting: Unknown Physician Specialty

## 2017-09-23 ENCOUNTER — Encounter: Payer: Self-pay | Admitting: Unknown Physician Specialty

## 2017-09-23 DIAGNOSIS — F259 Schizoaffective disorder, unspecified: Secondary | ICD-10-CM

## 2017-09-23 DIAGNOSIS — I10 Essential (primary) hypertension: Secondary | ICD-10-CM

## 2017-09-23 DIAGNOSIS — I129 Hypertensive chronic kidney disease with stage 1 through stage 4 chronic kidney disease, or unspecified chronic kidney disease: Secondary | ICD-10-CM | POA: Diagnosis not present

## 2017-09-23 DIAGNOSIS — E7800 Pure hypercholesterolemia, unspecified: Secondary | ICD-10-CM | POA: Diagnosis not present

## 2017-09-23 DIAGNOSIS — N183 Chronic kidney disease, stage 3 unspecified: Secondary | ICD-10-CM

## 2017-09-23 DIAGNOSIS — G8929 Other chronic pain: Secondary | ICD-10-CM | POA: Diagnosis not present

## 2017-09-23 DIAGNOSIS — M5442 Lumbago with sciatica, left side: Secondary | ICD-10-CM

## 2017-09-23 DIAGNOSIS — M5441 Lumbago with sciatica, right side: Secondary | ICD-10-CM

## 2017-09-23 NOTE — Assessment & Plan Note (Signed)
Check lipid panel today 

## 2017-09-23 NOTE — Progress Notes (Signed)
BP 127/76   Pulse 66   Temp 97.9 F (36.6 C) (Oral)   Ht 5\' 2"  (1.575 m)   Wt 127 lb 3.2 oz (57.7 kg)   LMP  (LMP Unknown)   SpO2 98%   BMI 23.27 kg/m    Subjective:    Patient ID: Denise Everett, female    DOB: Jul 08, 1951, 66 y.o.   MRN: [id number]  HPI: Denise Everett is a 66 y.o. female  Chief Complaint  Patient presents with  . Annual Exam    pt had [facility]16/17   Hip and leg pain Pt states she has "the same old problem" with her legs.  She went to the pain clinic but stopped as they didn't give her the treatments she needed.  Looks like an MRI of the back and the hip was ordered but she didn't go.   CKD Followed by Nephrology every 3 months.  Last GFR I see is 34.    Thrombocytopenia Notes reviewed from Dr. Grayland Ormond.  Followed every 6 months  Hypertension Using medications without difficulty Average home BPs Not checking  No problems or lightheadedness No chest pain with exertion or shortness of breath No Edema  Hyperlipidemia Using medications without problems Diet compliance:Exercise:walks a lot  Schitzophrenia Stable with Risperdal.  Has not seen psychiatry for quite some time   Relevant past medical, surgical, family and social history reviewed and updated as indicated. Interim medical history since our last visit reviewed. Allergies and medications reviewed and updated.  Review of Systems  Constitutional: Negative.   HENT: Negative.   Eyes: Negative.   Respiratory: Negative.   Cardiovascular: Negative.   Gastrointestinal: Negative.   Endocrine: Negative.   Genitourinary: Negative.   Allergic/Immunologic: Negative.   Neurological: Negative.   Hematological: Negative.   Psychiatric/Behavioral: Negative.     Per HPI unless specifically indicated above     Objective:    BP 127/76   Pulse 66   Temp 97.9 F (36.6 C) (Oral)   Ht 5\' 2"  (1.575 m)   Wt 127 lb 3.2 oz (57.7 kg)   LMP  (LMP Unknown)   SpO2 98%   BMI 23.27  kg/m   Wt Readings from Last 3 Encounters:  09/23/17 127 lb 3.2 oz (57.7 kg)  09/16/17 128 lb 9.6 oz (58.3 kg)  08/05/17 122 lb 3.2 oz (55.4 kg)    Physical Exam  Constitutional: She is oriented to person, place, and time. She appears well-developed and well-nourished.  HENT:  Head: Normocephalic and atraumatic.  Eyes: Pupils are equal, round, and reactive to light. Right eye exhibits no discharge. Left eye exhibits no discharge. No scleral icterus.  Neck: Normal range of motion. Neck supple. Carotid bruit is not present. No thyromegaly present.  Cardiovascular: Normal rate, regular rhythm and normal heart sounds. Exam reveals no gallop and no friction rub.  No murmur heard. Pulmonary/Chest: Effort normal and breath sounds normal. No respiratory distress. She has no wheezes. She has no rales.  Abdominal: Soft. Bowel sounds are normal. There is no tenderness. There is no rebound.  Genitourinary: No breast swelling, tenderness or discharge.  Musculoskeletal: Normal range of motion.  Lymphadenopathy:    She has no cervical adenopathy.  Neurological: She is alert and oriented to person, place, and time.  Skin: Skin is warm, dry and intact. No rash noted.  Psychiatric: She has a normal mood and affect. Her speech is normal and behavior is normal. Judgment and thought content normal.  Cognition and memory are normal.    Results for orders placed or performed in visit on 08/12/17  CBC and differential  Result Value Ref Range   Hemoglobin 11.0 (A) 12.0 - 16.0   HCT 34 (A) 36 - 46   Platelets 83 (A) 150 - 399   WBC 5.5   Basic metabolic panel  Result Value Ref Range   Glucose 101    BUN 9 4 - 21   Creatinine 1.6 (A) 0.5 - 1.1   Potassium 3.9 3.4 - 5.3   Sodium 136 (A) 137 - 147  Hepatic function panel  Result Value Ref Range   Alkaline Phosphatase 75 25 - 125   ALT 5 (A) 7 - 35   AST 13 13 - 35   Bilirubin, Total 0.2       Assessment & Plan:      Problem List Items Addressed  This Visit      Unprioritized   Chronic low back pain (Location of Primary Source of Pain) (Bilateral) (R>L) (Chronic)    Biggest problem for pt.  Unable to get help at the pain clinic.  Encouraged pt to follow throug      Hyperlipidemia    Check lipid panel today      Relevant Orders   Lipid Panel w/o Chol/HDL Ratio   Hypertension    Stable, continue present medications.        Hypertensive CKD (chronic kidney disease)    Stable, continue present medications.        Relevant Orders   Comprehensive metabolic panel   Lipid Panel w/o Chol/HDL Ratio   Schizoaffective disorder, unspecified (HCC)    Stable, continue present medications.            Follow up plan: Return in about 6 months (around 03/23/2018).

## 2017-09-23 NOTE — Assessment & Plan Note (Signed)
Biggest problem for pt.  Unable to get help at the pain clinic.  Encouraged pt to follow throug

## 2017-09-23 NOTE — Assessment & Plan Note (Signed)
Stable, continue present medications.   

## 2017-09-24 LAB — COMPREHENSIVE METABOLIC PANEL
A/G RATIO: 1.7 (ref 1.2–2.2)
ALT: 4 IU/L (ref 0–32)
AST: 8 IU/L (ref 0–40)
Albumin: 4 g/dL (ref 3.6–4.8)
Alkaline Phosphatase: 59 IU/L (ref 39–117)
BUN/Creatinine Ratio: 13 (ref 12–28)
BUN: 19 mg/dL (ref 8–27)
Bilirubin Total: <0.2 mg/dL (ref 0.0–1.2)
CALCIUM: 8.6 mg/dL — AB (ref 8.7–10.3)
CHLORIDE: 102 mmol/L (ref 96–106)
CO2: 16 mmol/L — ABNORMAL LOW (ref 20–29)
Creatinine, Ser: 1.44 mg/dL — ABNORMAL HIGH (ref 0.57–1.00)
GFR calc non Af Amer: 37 mL/min/{1.73_m2} — ABNORMAL LOW (ref >59)
GFR, EST AFRICAN AMERICAN: 43 mL/min/{1.73_m2} — AB (ref >59)
Globulin, Total: 2.3 g/dL (ref 1.5–4.5)
Glucose: 100 mg/dL — ABNORMAL HIGH (ref 65–99)
POTASSIUM: 4.8 mmol/L (ref 3.5–5.2)
Sodium: 132 mmol/L — ABNORMAL LOW (ref 134–144)
TOTAL PROTEIN: 6.3 g/dL (ref 6.0–8.5)

## 2017-09-24 LAB — LIPID PANEL W/O CHOL/HDL RATIO
CHOLESTEROL TOTAL: 167 mg/dL (ref 100–199)
HDL: 53 mg/dL (ref >39)
LDL Calculated: 98 mg/dL (ref 0–99)
TRIGLYCERIDES: 78 mg/dL (ref 0–149)
VLDL CHOLESTEROL CAL: 16 mg/dL (ref 5–40)

## 2017-09-25 ENCOUNTER — Encounter: Payer: Self-pay | Admitting: Unknown Physician Specialty

## 2017-10-04 NOTE — Progress Notes (Deleted)
[place]  Telephone:(336) [phone] Fax:(336) [phone]  ID: Denise Everett OB: 1950-12-07  MR#: [id number]  EYC#:[id number]  Patient Care Team: [name], NP as PCP - General (Nurse Practitioner) Lloyd Huger, MD as Consulting Physician (Oncology)  CHIEF COMPLAINT: MGUS and ITP  INTERVAL HISTORY: Patient returns to clinic today for routine 6 month evaluation and laboratory work. She has had unintentional weight loss over the past several months, but otherwise feels well and is asymptomatic. She denies fevers or night sweats. She does not complain of easy bleeding or bruising today.  She has no neurologic complaints. She denies any chest pain or shortness of breath. She denies any nausea, vomiting, constipation, or diarrhea.  She has no melena or hematochezia.  She has no urinary complaints.  Patient offers no further specific complaints today.  REVIEW OF SYSTEMS:   Review of Systems  Constitutional: Positive for weight loss. Negative for fever and malaise/fatigue.  Respiratory: Negative.  Negative for cough and shortness of breath.   Cardiovascular: Negative.  Negative for chest pain and leg swelling.  Gastrointestinal: Negative.  Negative for abdominal pain.  Musculoskeletal: Negative.   Neurological: Negative.  Negative for weakness.  Endo/Heme/Allergies: Does not bruise/bleed easily.  Psychiatric/Behavioral: The patient is not nervous/anxious.     As per HPI. Otherwise, a complete review of systems is negative.  PAST MEDICAL HISTORY: Past Medical History:  Diagnosis Date  . Abnormal weight loss 06/07/2015  . Allergy   . Cataract   . Chronic kidney disease   . Chronic pain syndrome   . Hyperlipidemia   . Hypertension   . Leg pain   . Osteoporosis    hips  . Renal insufficiency   . Schizophrenia ([place])   . Thrombocytopenia ([place])   . Tobacco use disorder     PAST SURGICAL HISTORY: Past Surgical History:  Procedure Laterality Date  .  ankle surgery Right    x's 2  . CATARACT EXTRACTION      FAMILY HISTORY Family History  Problem Relation Age of Onset  . Hyperlipidemia Mother   . Hypertension Mother        ADVANCED DIRECTIVES:    HEALTH MAINTENANCE: Social History   Tobacco Use  . Smoking status: Former Smoker    Packs/day: 1.00    Years: 43.00    Pack years: 43.00    Types: Cigarettes    Last attempt to quit: 07/21/2017    Years since quitting: 0.2  . Smokeless tobacco: Former Systems developer    Types: Snuff  Substance Use Topics  . Alcohol use: No  . Drug use: No     Colonoscopy:  PAP:  Bone density:  Lipid panel:  Allergies  Allergen Reactions  . No Known Allergies     Current Outpatient Medications  Medication Sig Dispense Refill  . acetaminophen (TYLENOL) 325 MG tablet Take 650 mg every 6 (six) hours as needed by mouth.    . Aspirin-Acetaminophen-Caffeine (GOODYS EXTRA STRENGTH) [phone] MG PACK Take by mouth.    Marland Kitchen atorvastatin (LIPITOR) 10 MG tablet TAKE ONE TABLET BY MOUTH DAILY AT 6PM 90 tablet 1  . lisinopril (PRINIVIL,ZESTRIL) 10 MG tablet TAKE 1 TABLET BY MOUTH EVERY DAY 90 tablet 0  . raloxifene (EVISTA) 60 MG tablet TAKE ONE TABLET BY MOUTH DAILY 90 tablet 1  . risperiDONE (RISPERDAL) 2 MG tablet TAKE ONE-HALF TO ONE TABLET BY MOUTH AT BEDTIME 90 tablet 0   No current facility-administered medications for this visit.  OBJECTIVE: There were no vitals filed for this visit.   There is no height or weight on file to calculate BMI.    ECOG FS:0 - Asymptomatic  General: Well-developed, well-nourished, no acute distress. Eyes: Pink conjunctiva, anicteric sclera. Lungs: Clear to auscultation bilaterally. Heart: Regular rate and rhythm. No rubs, murmurs, or gallops. Abdomen: Soft, nontender, nondistended. No organomegaly noted, normoactive bowel sounds. Musculoskeletal: No edema, cyanosis, or clubbing. Neuro: Alert, answering all questions appropriately. Cranial nerves grossly  intact. Skin: No rashes or petechiae noted. Occasional ecchymoses noted. Psych: Normal affect.   LAB RESULTS:  Lab Results  Component Value Date   NA 132 (L) 09/23/2017   K 4.8 09/23/2017   CL 102 09/23/2017   CO2 16 (L) 09/23/2017   GLUCOSE 100 (H) 09/23/2017   BUN 19 09/23/2017   CREATININE 1.44 (H) 09/23/2017   CALCIUM 8.6 (L) 09/23/2017   PROT 6.3 09/23/2017   ALBUMIN 4.0 09/23/2017   AST 8 09/23/2017   ALT 4 09/23/2017   ALKPHOS 59 09/23/2017   BILITOT <0.2 09/23/2017   GFRNONAA 37 (L) 09/23/2017   GFRAA 43 (L) 09/23/2017    Lab Results  Component Value Date   WBC 5.5 07/21/2017   NEUTROABS 3.6 01/14/2017   HGB 11.0 (A) 07/21/2017   HCT 34 (A) 07/21/2017   MCV 89.6 01/14/2017   PLT 83 (A) 07/21/2017     STUDIES: No results found.  ASSESSMENT: MGUS and ITP.  PLAN:    1.  ITP: Previously, bone marrow biopsy confirmed the results. Patient's platelet count is decreased, but stable. Today's specimen was clotted, therefore no results were given. Patient has declined returning to the lab for repeat bloodwork. She does not require treatment at this time. She last received Rituxan in September 2013. Return to clinic in 1 months for laboratory work and further evaluation. Of note, patient had a response to prednisone but it was not durable.    2. MGUS:  Bone marrow biopsy revealed less than 5% plasma cells.  His recent M spike was stable at 0.7. Continue to monitor SIEP once per year. 3. Weight loss: Patient's weight continues to decline. Return to clinic in 1 month as above. If she continues to lose weight, will consider imaging to further evaluate.  4. Smoking history: Patient underwent CT screening of the chest on February 03, 2017.  Patient expressed understanding and was in agreement with this plan. She also understands that She can call clinic at any time with any questions, concerns, or complaints.    [place] Huger, MD   10/04/2017 9:30 AM

## 2017-10-06 ENCOUNTER — Inpatient Hospital Stay: Payer: Medicare Other

## 2017-10-06 ENCOUNTER — Inpatient Hospital Stay: Payer: Medicare Other | Admitting: Oncology

## 2017-10-14 ENCOUNTER — Other Ambulatory Visit: Payer: Self-pay | Admitting: Family Medicine

## 2017-10-14 ENCOUNTER — Other Ambulatory Visit: Payer: Self-pay | Admitting: Unknown Physician Specialty

## 2017-10-27 ENCOUNTER — Other Ambulatory Visit: Payer: Medicare Other

## 2017-10-27 ENCOUNTER — Ambulatory Visit: Payer: Medicare Other | Admitting: Oncology

## 2017-10-28 DIAGNOSIS — Z1231 Encounter for screening mammogram for malignant neoplasm of breast: Secondary | ICD-10-CM | POA: Diagnosis not present

## 2017-10-29 ENCOUNTER — Other Ambulatory Visit: Payer: Medicare Other

## 2017-10-29 ENCOUNTER — Ambulatory Visit: Payer: Medicare Other | Admitting: Oncology

## 2017-10-29 DIAGNOSIS — M3501 Sicca syndrome with keratoconjunctivitis: Secondary | ICD-10-CM | POA: Diagnosis not present

## 2017-11-16 ENCOUNTER — Other Ambulatory Visit: Payer: Self-pay

## 2017-11-16 DIAGNOSIS — Z1211 Encounter for screening for malignant neoplasm of colon: Secondary | ICD-10-CM

## 2017-11-16 DIAGNOSIS — Z1212 Encounter for screening for malignant neoplasm of rectum: Principal | ICD-10-CM

## 2017-11-16 NOTE — Progress Notes (Signed)
Gastroenterology Pre-Procedure Review  Request Date: 01/28/18 Requesting Physician: Dr. Vicente Males  PATIENT REVIEW QUESTIONS: The patient responded to the following health history questions as indicated:    1. Are you having any GI issues? no 2. Do you have a personal history of Polyps? no 3. Do you have a family history of Colon Cancer or Polyps? no 4. Diabetes Mellitus? no 5. Joint replacements in the past 12 months?no 6. Major health problems in the past 3 months?no 7. Any artificial heart valves, MVP, or defibrillator?no    MEDICATIONS & ALLERGIES:    Patient reports the following regarding taking any anticoagulation/antiplatelet therapy:   Plavix, Coumadin, Eliquis, Xarelto, Lovenox, Pradaxa, Brilinta, or Effient? no Aspirin? no  Patient confirms/reports the following medications:  Current Outpatient Medications  Medication Sig Dispense Refill  . acetaminophen (TYLENOL) 325 MG tablet Take 650 mg every 6 (six) hours as needed by mouth.    . Aspirin-Acetaminophen-Caffeine (GOODYS EXTRA STRENGTH) [phone] MG PACK Take by mouth.    Marland Kitchen atorvastatin (LIPITOR) 10 MG tablet TAKE ONE TABLET BY MOUTH DAILY AT 6PM 90 tablet 1  . lisinopril (PRINIVIL,ZESTRIL) 10 MG tablet TAKE 1 TABLET BY MOUTH EVERY DAY 90 tablet 0  . raloxifene (EVISTA) 60 MG tablet TAKE ONE TABLET BY MOUTH DAILY 90 tablet 1  . risperiDONE (RISPERDAL) 2 MG tablet TAKE ONE-HALF TO ONE TABLET BY MOUTH AT BEDTIME 90 tablet 0   No current facility-administered medications for this visit.     Patient confirms/reports the following allergies:  Allergies  Allergen Reactions  . No Known Allergies     No orders of the defined types were placed in this encounter.   AUTHORIZATION INFORMATION Primary Insurance: 1D#: Group #:  Secondary Insurance: 1D#: Group #:  SCHEDULE INFORMATION: Date: 01/28/18 Time: Location: [facility] .

## 2017-11-22 NOTE — Progress Notes (Signed)
[place]  [phone]) [phone] [phone]) [phone]  ID: [name] OB: 30-Oct-1951  MR#: [id number]  AOZ#:[id number]  Patient Care Team: [name], NP as PCP - General (Nurse Practitioner) Lloyd Huger, MD as Consulting Physician (Oncology)  CHIEF COMPLAINT: MGUS and ITP  INTERVAL HISTORY: Patient returns to clinic today for routine 6 month evaluation and laboratory work. She currently feels well and is asymptomatic.  She does not complain of any further weight loss.  She denies fevers or night sweats. She does not complain of easy bleeding or bruising today.  She has no neurologic complaints. She denies any chest pain or shortness of breath. She denies any nausea, vomiting, constipation, or diarrhea.  She has no melena or hematochezia.  She has no urinary complaints.  Patient offers no further specific complaints today.  REVIEW OF SYSTEMS:   Review of Systems  Constitutional: Negative.  Negative for fever, malaise/fatigue and weight loss.  Respiratory: Negative.  Negative for cough and shortness of breath.   Cardiovascular: Negative.  Negative for chest pain and leg swelling.  Gastrointestinal: Negative.  Negative for abdominal pain, blood in stool and melena.  Genitourinary: Negative.   Musculoskeletal: Negative.   Skin: Negative.  Negative for rash.  Neurological: Negative.  Negative for weakness.  Endo/Heme/Allergies: Does not bruise/bleed easily.  Psychiatric/Behavioral: Negative.  The patient is not nervous/anxious.     As per HPI. Otherwise, a complete review of systems is negative.  PAST MEDICAL HISTORY: Past Medical History:  Diagnosis Date  . Abnormal weight loss 06/07/2015  . Allergy   . Cataract   . Chronic kidney disease   . Chronic pain syndrome   . Hyperlipidemia   . Hypertension   . Leg pain   . Osteoporosis    hips  . Renal insufficiency   . Schizophrenia ([place])   . Thrombocytopenia ([place])   . Tobacco use disorder      PAST SURGICAL HISTORY: Past Surgical History:  Procedure Laterality Date  . ankle surgery Right    x's 2  . CATARACT EXTRACTION      FAMILY HISTORY Family History  Problem Relation Age of Onset  . Hyperlipidemia Mother   . Hypertension Mother        ADVANCED DIRECTIVES:    HEALTH MAINTENANCE: Social History   Tobacco Use  . Smoking status: Former Smoker    Packs/day: 1.00    Years: 43.00    Pack years: 43.00    Types: Cigarettes    Last attempt to quit: 07/21/2017    Years since quitting: 0.3  . Smokeless tobacco: Former Systems developer    Types: Snuff  Substance Use Topics  . Alcohol use: No  . Drug use: No     Colonoscopy:  PAP:  Bone density:  Lipid panel:  Allergies  Allergen Reactions  . No Known Allergies     Current Outpatient Medications  Medication Sig Dispense Refill  . acetaminophen (TYLENOL) 325 MG tablet Take 650 mg every 6 (six) hours as needed by mouth.    . Aspirin-Acetaminophen-Caffeine (GOODYS EXTRA STRENGTH) [id number] MG PACK Take by mouth.    Marland Kitchen atorvastatin (LIPITOR) 10 MG tablet TAKE ONE TABLET BY MOUTH DAILY AT 6PM 90 tablet 1  . lisinopril (PRINIVIL,ZESTRIL) 10 MG tablet TAKE 1 TABLET BY MOUTH EVERY DAY 90 tablet 0  . raloxifene (EVISTA) 60 MG tablet TAKE ONE TABLET BY MOUTH DAILY 90 tablet 1  . risperiDONE (RISPERDAL) 2 MG tablet TAKE ONE-HALF TO ONE TABLET BY MOUTH  AT BEDTIME 90 tablet 0   No current facility-administered medications for this visit.     OBJECTIVE: Vitals:   11/24/17 1121  BP: 104/72  Pulse: 82  Resp: 18  Temp: (!) 96.3 F (35.7 C)     Body mass index is 23.65 kg/m.    ECOG FS:0 - Asymptomatic  General: Well-developed, well-nourished, no acute distress. Eyes: Pink conjunctiva, anicteric sclera. Lungs: Clear to auscultation bilaterally. Heart: Regular rate and rhythm. No rubs, murmurs, or gallops. Abdomen: Soft, nontender, nondistended. No organomegaly noted, normoactive bowel sounds. Musculoskeletal: No  edema, cyanosis, or clubbing. Neuro: Alert, answering all questions appropriately. Cranial nerves grossly intact. Skin: No rashes or petechiae noted. Occasional ecchymoses noted. Psych: Normal affect.   LAB RESULTS:  Lab Results  Component Value Date   NA 129 (L) 11/24/2017   K 6.0 (H) 11/24/2017   CL 99 (L) 11/24/2017   CO2 22 11/24/2017   GLUCOSE 99 11/24/2017   BUN 27 (H) 11/24/2017   CREATININE 1.74 (H) 11/24/2017   CALCIUM 8.7 (L) 11/24/2017   PROT 6.8 11/24/2017   ALBUMIN 3.7 11/24/2017   AST 13 (L) 11/24/2017   ALT 6 (L) 11/24/2017   ALKPHOS 45 11/24/2017   BILITOT 0.4 11/24/2017   GFRNONAA 29 (L) 11/24/2017   GFRAA 34 (L) 11/24/2017    Lab Results  Component Value Date   WBC 4.6 11/24/2017   NEUTROABS 2.7 11/24/2017   HGB 11.1 (L) 11/24/2017   HCT 34.0 (L) 11/24/2017   MCV 93.0 11/24/2017   PLT 53 (L) 11/24/2017     STUDIES: No results found.  ASSESSMENT: MGUS and ITP.  PLAN:    1.  ITP: Previously, bone marrow biopsy confirmed the results. Patient's platelet count is decreased, but stable.  She does not require treatment at this time. She last received Rituxan in September 2013.  Patient had a response to prednisone, but it was not durable.  Despite patient's thrombocytopenia, she has requested no further follow-up.  Please continue to monitor patient's platelet count 2-3 times per year and if it trends down below 30,000 or she becomes symptomatic please refer her back for reevaluation. 2. MGUS:  Bone marrow biopsy revealed less than 5% plasma cells.  His recent M spike was stable at 0.7. Continue to monitor SIEP once per year.  No follow-up per request of patient as above. 3. Weight loss: Patient does not complain of any further weight loss.  4. Smoking history: Patient underwent CT screening of the chest on February 03, 2017.  Patient expressed understanding and was in agreement with this plan. She also understands that She can call clinic at any time with  any questions, concerns, or complaints.    Lloyd Huger, MD   11/27/2017 3:17 PM

## 2017-11-24 ENCOUNTER — Inpatient Hospital Stay: Payer: Medicare Other

## 2017-11-24 ENCOUNTER — Inpatient Hospital Stay: Payer: Medicare Other | Attending: Oncology | Admitting: Oncology

## 2017-11-24 VITALS — BP 104/72 | HR 82 | Temp 96.3°F | Resp 18 | Wt 129.3 lb

## 2017-11-24 DIAGNOSIS — I129 Hypertensive chronic kidney disease with stage 1 through stage 4 chronic kidney disease, or unspecified chronic kidney disease: Secondary | ICD-10-CM | POA: Diagnosis not present

## 2017-11-24 DIAGNOSIS — F209 Schizophrenia, unspecified: Secondary | ICD-10-CM | POA: Insufficient documentation

## 2017-11-24 DIAGNOSIS — Z79899 Other long term (current) drug therapy: Secondary | ICD-10-CM

## 2017-11-24 DIAGNOSIS — D472 Monoclonal gammopathy: Secondary | ICD-10-CM | POA: Insufficient documentation

## 2017-11-24 DIAGNOSIS — D693 Immune thrombocytopenic purpura: Secondary | ICD-10-CM

## 2017-11-24 DIAGNOSIS — N189 Chronic kidney disease, unspecified: Secondary | ICD-10-CM | POA: Insufficient documentation

## 2017-11-24 DIAGNOSIS — E785 Hyperlipidemia, unspecified: Secondary | ICD-10-CM | POA: Insufficient documentation

## 2017-11-24 DIAGNOSIS — Z87891 Personal history of nicotine dependence: Secondary | ICD-10-CM | POA: Diagnosis not present

## 2017-11-24 DIAGNOSIS — M810 Age-related osteoporosis without current pathological fracture: Secondary | ICD-10-CM | POA: Diagnosis not present

## 2017-11-24 LAB — COMPREHENSIVE METABOLIC PANEL
ALT: 6 U/L — ABNORMAL LOW (ref 14–54)
ANION GAP: 8 (ref 5–15)
AST: 13 U/L — AB (ref 15–41)
Albumin: 3.7 g/dL (ref 3.5–5.0)
Alkaline Phosphatase: 45 U/L (ref 38–126)
BUN: 27 mg/dL — ABNORMAL HIGH (ref 6–20)
CHLORIDE: 99 mmol/L — AB (ref 101–111)
CO2: 22 mmol/L (ref 22–32)
Calcium: 8.7 mg/dL — ABNORMAL LOW (ref 8.9–10.3)
Creatinine, Ser: 1.74 mg/dL — ABNORMAL HIGH (ref 0.44–1.00)
GFR calc Af Amer: 34 mL/min — ABNORMAL LOW (ref >60)
GFR calc non Af Amer: 29 mL/min — ABNORMAL LOW (ref >60)
GLUCOSE: 99 mg/dL (ref 65–99)
POTASSIUM: 6 mmol/L — AB (ref 3.5–5.1)
SODIUM: 129 mmol/L — AB (ref 135–145)
Total Bilirubin: 0.4 mg/dL (ref 0.3–1.2)
Total Protein: 6.8 g/dL (ref 6.5–8.1)

## 2017-11-24 LAB — CBC WITH DIFFERENTIAL/PLATELET
Basophils Absolute: 0.1 10*3/uL (ref 0–0.1)
Basophils Relative: 1 %
Eosinophils Absolute: 0.1 10*3/uL (ref 0–0.7)
Eosinophils Relative: 2 %
HEMATOCRIT: 34 % — AB (ref 35.0–47.0)
Hemoglobin: 11.1 g/dL — ABNORMAL LOW (ref 12.0–16.0)
LYMPHS ABS: 1.4 10*3/uL (ref 1.0–3.6)
LYMPHS PCT: 30 %
MCH: 30.3 pg (ref 26.0–34.0)
MCHC: 32.6 g/dL (ref 32.0–36.0)
MCV: 93 fL (ref 80.0–100.0)
MONO ABS: 0.4 10*3/uL (ref 0.2–0.9)
Monocytes Relative: 9 %
NEUTROS ABS: 2.7 10*3/uL (ref 1.4–6.5)
Neutrophils Relative %: 58 %
Platelets: 53 10*3/uL — ABNORMAL LOW (ref 150–440)
RBC: 3.66 MIL/uL — ABNORMAL LOW (ref 3.80–5.20)
RDW: 13.7 % (ref 11.5–14.5)
WBC: 4.6 10*3/uL (ref 3.6–11.0)

## 2018-01-12 ENCOUNTER — Other Ambulatory Visit: Payer: Self-pay | Admitting: Unknown Physician Specialty

## 2018-01-18 LAB — FECAL OCCULT BLOOD, GUAIAC: Fecal Occult Blood: NEGATIVE

## 2018-01-26 DIAGNOSIS — E872 Acidosis: Secondary | ICD-10-CM | POA: Diagnosis not present

## 2018-01-26 DIAGNOSIS — N183 Chronic kidney disease, stage 3 (moderate): Secondary | ICD-10-CM | POA: Diagnosis not present

## 2018-01-26 DIAGNOSIS — I10 Essential (primary) hypertension: Secondary | ICD-10-CM | POA: Diagnosis not present

## 2018-01-26 DIAGNOSIS — D631 Anemia in chronic kidney disease: Secondary | ICD-10-CM | POA: Diagnosis not present

## 2018-01-28 ENCOUNTER — Encounter: Admission: RE | Payer: Self-pay | Source: Ambulatory Visit

## 2018-01-28 ENCOUNTER — Ambulatory Visit: Admission: RE | Admit: 2018-01-28 | Payer: Medicare Other | Source: Ambulatory Visit | Admitting: Gastroenterology

## 2018-01-28 SURGERY — COLONOSCOPY WITH PROPOFOL
Anesthesia: General

## 2018-02-05 ENCOUNTER — Telehealth: Payer: Self-pay | Admitting: *Deleted

## 2018-02-05 DIAGNOSIS — Z122 Encounter for screening for malignant neoplasm of respiratory organs: Secondary | ICD-10-CM

## 2018-02-05 DIAGNOSIS — Z87891 Personal history of nicotine dependence: Secondary | ICD-10-CM

## 2018-02-05 NOTE — Telephone Encounter (Signed)
Notified patient that annual lung cancer screening low dose CT scan is due currently or will be in near future. Confirmed that patient is within the age range of 55-77, and asymptomatic, (no signs or symptoms of lung cancer). Patient denies illness that would prevent curative treatment for lung cancer if found. Verified smoking history, (former, quit 07/21/17, 43 pack year). The shared decision making visit was done 02/03/17. Patient is agreeable for CT scan being scheduled.

## 2018-02-15 ENCOUNTER — Ambulatory Visit: Admission: RE | Admit: 2018-02-15 | Payer: Medicare Other | Source: Ambulatory Visit

## 2018-02-15 ENCOUNTER — Inpatient Hospital Stay: Admission: RE | Admit: 2018-02-15 | Payer: Medicare Other | Source: Ambulatory Visit

## 2018-02-23 ENCOUNTER — Other Ambulatory Visit: Payer: Self-pay | Admitting: Unknown Physician Specialty

## 2018-02-23 ENCOUNTER — Other Ambulatory Visit: Payer: Self-pay | Admitting: Family Medicine

## 2018-03-02 ENCOUNTER — Ambulatory Visit (INDEPENDENT_AMBULATORY_CARE_PROVIDER_SITE_OTHER): Payer: Medicare Other | Admitting: Unknown Physician Specialty

## 2018-03-02 ENCOUNTER — Encounter: Payer: Self-pay | Admitting: Unknown Physician Specialty

## 2018-03-02 VITALS — BP 128/77 | HR 60 | Temp 97.7°F | Ht 62.0 in | Wt 123.4 lb

## 2018-03-02 DIAGNOSIS — I10 Essential (primary) hypertension: Secondary | ICD-10-CM

## 2018-03-02 DIAGNOSIS — E7800 Pure hypercholesterolemia, unspecified: Secondary | ICD-10-CM

## 2018-03-02 DIAGNOSIS — Z7189 Other specified counseling: Secondary | ICD-10-CM | POA: Diagnosis not present

## 2018-03-02 DIAGNOSIS — G894 Chronic pain syndrome: Secondary | ICD-10-CM | POA: Diagnosis not present

## 2018-03-02 DIAGNOSIS — J069 Acute upper respiratory infection, unspecified: Secondary | ICD-10-CM | POA: Diagnosis not present

## 2018-03-02 DIAGNOSIS — R634 Abnormal weight loss: Secondary | ICD-10-CM

## 2018-03-02 MED ORDER — GABAPENTIN 300 MG PO CAPS: 300.0000 mg | ORAL_CAPSULE | Freq: Three times a day (TID) | ORAL | 3 refills | Status: DC

## 2018-03-02 NOTE — Progress Notes (Signed)
BP 128/77   Pulse 60   Temp 97.7 F (36.5 C) (Oral)   Ht 5\' 2"  (1.575 m)   Wt 123 lb 6.4 oz (56 kg)   LMP  (LMP Unknown)   SpO2 100%   BMI 22.57 kg/m    Subjective:    Patient ID: Denise Everett, female    DOB: 16-Jun-1951, 67 y.o.   MRN: [id number]  HPI: Denise Everett is a 67 y.o. female  Chief Complaint  Patient presents with  . Hyperlipidemia  . Hypertension  . Pain    pt states a nurse from [facility] came out and did a home assessment, recommended a cream for pain for the patient to use  . URI    pt states she has had a cough, congestion, and runny nose for 2 weeks   Hypertension Using medications without difficulty Average home BPs   No problems or lightheadedness No chest pain with exertion or shortness of breath No Edema   Hyperlipidemia Using medications without problems: Muscle aches but no change on or off the medication Diet compliance:Exercise:Walks a lot  Chronic leg pain Long-standing.  Evaluation done with neurology and vascular.  States the whole leg hurts.  Walking helps.  We have tried Gabapentin in the past.  Willing to try again  URI   This is a new problem. Episode onset: 2 weeks. The problem has been gradually improving. There has been no fever. Associated symptoms include congestion, coughing and rhinorrhea. Pertinent negatives include no ear pain, sore throat or wheezing. She has tried nothing for the symptoms.   Fall Risk  09/16/2017 04/16/2017 02/24/2017 07/07/2016  Falls in the past year? No Yes No No  Number falls in past yr: - 1 - -  Injury with Fall? - Yes - -  Comment - bruised left knee.  had xray - -  Risk for fall due to : - (No Data) - -  Risk for fall due to: Comment - fell off of  he bed - -  Follow up - Falls prevention discussed - -       Relevant past medical, surgical, family and social history reviewed and updated as indicated. Interim medical history since our last visit reviewed. Allergies and medications reviewed and  updated.  Review of Systems  HENT: Positive for congestion and rhinorrhea. Negative for ear pain and sore throat.   Respiratory: Positive for cough. Negative for wheezing.     Per HPI unless specifically indicated above     Objective:    BP 128/77   Pulse 60   Temp 97.7 F (36.5 C) (Oral)   Ht 5\' 2"  (1.575 m)   Wt 123 lb 6.4 oz (56 kg)   LMP  (LMP Unknown)   SpO2 100%   BMI 22.57 kg/m   Wt Readings from Last 3 Encounters:  03/02/18 123 lb 6.4 oz (56 kg)  11/24/17 129 lb 4.8 oz (58.7 kg)  09/23/17 127 lb 3.2 oz (57.7 kg)    Physical Exam  Constitutional: She is oriented to person, place, and time. She appears well-developed and well-nourished. No distress.  HENT:  Head: Normocephalic and atraumatic.  Right Ear: Tympanic membrane and ear canal normal.  Left Ear: Tympanic membrane and ear canal normal.  Nose: Rhinorrhea present. Right sinus exhibits no maxillary sinus tenderness and no frontal sinus tenderness. Left sinus exhibits no maxillary sinus tenderness and no frontal sinus tenderness.  Mouth/Throat: Mucous membranes are normal. Posterior oropharyngeal erythema present.  Eyes:  Conjunctivae and lids are normal. Right eye exhibits no discharge. Left eye exhibits no discharge. No scleral icterus.  Cardiovascular: Normal rate and regular rhythm.  Pulmonary/Chest: Effort normal and breath sounds normal. No respiratory distress.  Abdominal: Normal appearance. There is no splenomegaly or hepatomegaly.  Musculoskeletal: Normal range of motion.  Neurological: She is alert and oriented to person, place, and time.  Skin: Skin is intact. No rash noted. No pallor.  Psychiatric: She has a normal mood and affect. Her behavior is normal. Judgment and thought content normal.    Results for orders placed or performed in visit on 02/08/18  Fecal Occult Blood, [place]ai[place]  Result Value Ref Range   Fecal Occult Blood Negative       Assessment & Plan:   Problem List Items Addressed This  Visit      Unprioritized   Advance care planning    A voluntary discussion about advance care planning including the explanation and discussion of advance directives was extensively discussed  with the patient.  Explanation about the health care proxy and Living will was reviewed and packet with forms with explanation of how to fill them out was given.  During this discussion, the patient was able to identify a health care proxy as her brother Fritz Pickerel and plans to fill out the paperwork required.  Patient was offered a separate [place] visit for further assistance with forms.  Time spent: 15 minutes       Individuals present: patient       Chronic pain syndrome (Chronic)    Chronic leg pain.  Start Gabapentin 200 mg TID      Hyperlipidemia    Check lipid panel      Relevant Orders   Lipid Panel w/o Chol/HDL Ratio   Hypertension    Stable, continue present medications.        Relevant Orders   Comprehensive metabolic panel    Other Visit Diagnoses    Viral upper respiratory tract infection    -  Primary   Continue supportive care.  Seems to be resolving   Weight loss       Describes poor appetite.  Check labs.  Recheck 1 month   Relevant Orders   TSH   CBC with Differential/Platelet      HM Low dose CT is pending  Follow up plan: Return in about 1 month (around 03/30/2018).

## 2018-03-02 NOTE — Assessment & Plan Note (Signed)
Chronic leg pain.  Start Gabapentin 200 mg TID

## 2018-03-02 NOTE — Assessment & Plan Note (Signed)
Check lipid panel  

## 2018-03-02 NOTE — Assessment & Plan Note (Signed)
Stable, continue present medications.   

## 2018-03-02 NOTE — Assessment & Plan Note (Signed)
A voluntary discussion about advance care planning including the explanation and discussion of advance directives was extensively discussed  with the patient.  Explanation about the health care proxy and Living will was reviewed and packet with forms with explanation of how to fill them out was given.  During this discussion, the patient was able to identify a health care proxy as her brother Fritz Pickerel and plans to fill out the paperwork required.  Patient was offered a separate [place] visit for further assistance with forms.  Time spent: 15 minutes       Individuals present: patient

## 2018-03-03 LAB — CBC WITH DIFFERENTIAL/PLATELET
Basophils Absolute: 0 10*3/uL (ref 0.0–0.2)
Basos: 0 %
EOS (ABSOLUTE): 0.1 10*3/uL (ref 0.0–0.4)
EOS: 3 %
Hematocrit: 33.1 % — ABNORMAL LOW (ref 34.0–46.6)
Hemoglobin: 11 g/dL — ABNORMAL LOW (ref 11.1–15.9)
IMMATURE GRANULOCYTES: 1 %
Immature Grans (Abs): 0 10*3/uL (ref 0.0–0.1)
LYMPHS: 30 %
Lymphocytes Absolute: 1.3 10*3/uL (ref 0.7–3.1)
MCH: 29.2 pg (ref 26.6–33.0)
MCHC: 33.2 g/dL (ref 31.5–35.7)
MCV: 88 fL (ref 79–97)
MONOS ABS: 0.4 10*3/uL (ref 0.1–0.9)
Monocytes: 10 %
NEUTROS PCT: 56 %
Neutrophils Absolute: 2.4 10*3/uL (ref 1.4–7.0)
Platelets: 52 10*3/uL — CL (ref 150–379)
RBC: 3.77 x10E6/uL (ref 3.77–5.28)
RDW: 14.6 % (ref 12.3–15.4)
WBC: 4.3 10*3/uL (ref 3.4–10.8)

## 2018-03-03 LAB — COMPREHENSIVE METABOLIC PANEL
ALBUMIN: 4 g/dL (ref 3.6–4.8)
ALT: 6 IU/L (ref 0–32)
AST: 13 IU/L (ref 0–40)
Albumin/Globulin Ratio: 1.4 (ref 1.2–2.2)
Alkaline Phosphatase: 63 IU/L (ref 39–117)
BUN/Creatinine Ratio: 7 — ABNORMAL LOW (ref 12–28)
BUN: 12 mg/dL (ref 8–27)
Bilirubin Total: <0.2 mg/dL (ref 0.0–1.2)
CO2: 20 mmol/L (ref 20–29)
CREATININE: 1.69 mg/dL — AB (ref 0.57–1.00)
Calcium: 9.1 mg/dL (ref 8.7–10.3)
Chloride: 98 mmol/L (ref 96–106)
GFR calc Af Amer: 35 mL/min/{1.73_m2} — ABNORMAL LOW (ref >59)
GFR calc non Af Amer: 31 mL/min/{1.73_m2} — ABNORMAL LOW (ref >59)
GLUCOSE: 95 mg/dL (ref 65–99)
Globulin, Total: 2.8 g/dL (ref 1.5–4.5)
Potassium: 4.1 mmol/L (ref 3.5–5.2)
Sodium: 135 mmol/L (ref 134–144)
Total Protein: 6.8 g/dL (ref 6.0–8.5)

## 2018-03-03 LAB — LIPID PANEL W/O CHOL/HDL RATIO
CHOLESTEROL TOTAL: 148 mg/dL (ref 100–199)
HDL: 42 mg/dL (ref >39)
LDL CALC: 94 mg/dL (ref 0–99)
TRIGLYCERIDES: 60 mg/dL (ref 0–149)
VLDL CHOLESTEROL CAL: 12 mg/dL (ref 5–40)

## 2018-03-03 LAB — TSH: TSH: 0.538 u[IU]/mL (ref 0.450–4.500)

## 2018-03-05 ENCOUNTER — Telehealth: Payer: Self-pay | Admitting: Gastroenterology

## 2018-03-05 ENCOUNTER — Encounter: Payer: Self-pay | Admitting: Unknown Physician Specialty

## 2018-03-05 NOTE — Telephone Encounter (Signed)
Please call pt to reschedule colonoscopy we received another referral for pt and looks like she was scheduled but procedure was cancelled

## 2018-03-09 ENCOUNTER — Other Ambulatory Visit: Payer: Self-pay

## 2018-03-09 DIAGNOSIS — Z1212 Encounter for screening for malignant neoplasm of rectum: Principal | ICD-10-CM

## 2018-03-09 DIAGNOSIS — Z1211 Encounter for screening for malignant neoplasm of colon: Secondary | ICD-10-CM

## 2018-03-23 ENCOUNTER — Ambulatory Visit: Payer: Medicare Other | Admitting: Unknown Physician Specialty

## 2018-04-01 ENCOUNTER — Telehealth: Payer: Self-pay | Admitting: *Deleted

## 2018-04-01 DIAGNOSIS — Z87891 Personal history of nicotine dependence: Secondary | ICD-10-CM

## 2018-04-01 DIAGNOSIS — Z122 Encounter for screening for malignant neoplasm of respiratory organs: Secondary | ICD-10-CM

## 2018-04-01 NOTE — Telephone Encounter (Signed)
Contacted patient regarding scheduling lung screening scan. Patient is agreeable to rescheduling missed appt in July. See prior documentation for eligibility information.

## 2018-04-02 ENCOUNTER — Ambulatory Visit: Payer: Medicare Other | Admitting: Unknown Physician Specialty

## 2018-04-07 ENCOUNTER — Encounter: Payer: Self-pay | Admitting: *Deleted

## 2018-04-12 ENCOUNTER — Other Ambulatory Visit: Payer: Self-pay | Admitting: Family Medicine

## 2018-04-19 ENCOUNTER — Telehealth: Payer: Self-pay

## 2018-04-19 NOTE — Telephone Encounter (Signed)
Contacted pt to reschedule colonoscopy from 04/29/18 to another date.  Pt requested to be rescheduled to August.  She has been rescheduled to 06/29/18. Notified Marsha in Endo to change date.  Thanks [employer]

## 2018-04-20 ENCOUNTER — Ambulatory Visit (INDEPENDENT_AMBULATORY_CARE_PROVIDER_SITE_OTHER): Payer: Medicare Other | Admitting: Unknown Physician Specialty

## 2018-04-20 ENCOUNTER — Encounter: Payer: Self-pay | Admitting: Unknown Physician Specialty

## 2018-04-20 DIAGNOSIS — G8929 Other chronic pain: Secondary | ICD-10-CM | POA: Diagnosis not present

## 2018-04-20 DIAGNOSIS — R634 Abnormal weight loss: Secondary | ICD-10-CM

## 2018-04-20 DIAGNOSIS — I10 Essential (primary) hypertension: Secondary | ICD-10-CM | POA: Diagnosis not present

## 2018-04-20 DIAGNOSIS — M79606 Pain in leg, unspecified: Secondary | ICD-10-CM | POA: Diagnosis not present

## 2018-04-20 MED ORDER — GABAPENTIN 300 MG PO CAPS: 300.0000 mg | ORAL_CAPSULE | Freq: Three times a day (TID) | ORAL | 3 refills | Status: DC

## 2018-04-20 NOTE — Progress Notes (Signed)
BP 103/66   Pulse 78   Temp 98.3 F (36.8 C) (Oral)   Ht 5\' 2"  (1.575 m)   Wt 128 lb 3.2 oz (58.2 kg)   LMP  (LMP Unknown)   SpO2 97%   BMI 23.45 kg/m    Subjective:    Patient ID: Denise Everett, female    DOB: 04/29/1951, 67 y.o.   MRN: [id number]  HPI: Denise Everett is a 67 y.o. female  Chief Complaint  Patient presents with  . Pain    pt states that the gabapentin is helping  . Weight Loss    f/up   Leg pain Last visit, started Gabapentin.   Working well for leg pain.    Weight loss This seems to be stabilized and gained 5 pounds from last visit. Low dose CT is pending.  Overdue for colonoscopy.  She has been scheduled and supposed to get both next month.  Also noted by hematology in evaluation of Thrombocytopenia  Thrombocytopenia - secondary to MGIS and ITP appears lot to f/u 6 months.  Getting needed [facility] from [facility].  Should be seen by hematology his fall.    Hypertension Using medications without difficulty Average home BPs Not checking   Occasional problems or lightheadedness No chest pain with exertion or shortness of breath No Edema   Relevant past medical, surgical, family and social history reviewed and updated as indicated. Interim medical history since our last visit reviewed. Allergies and medications reviewed and updated.  Review of Systems  Per HPI unless specifically indicated above     Objective:    BP 103/66   Pulse 78   Temp 98.3 F (36.8 C) (Oral)   Ht 5\' 2"  (1.575 m)   Wt 128 lb 3.2 oz (58.2 kg)   LMP  (LMP Unknown)   SpO2 97%   BMI 23.45 kg/m   Wt Readings from Last 3 Encounters:  04/20/18 128 lb 3.2 oz (58.2 kg)  03/02/18 123 lb 6.4 oz (56 kg)  11/24/17 129 lb 4.8 oz (58.7 kg)    Physical Exam  Constitutional: She is oriented to person, place, and time. She appears well-developed and well-nourished. No distress.  HENT:  Head: Normocephalic and atraumatic.  Eyes: Conjunctivae and lids are normal. Right eye exhibits  no discharge. Left eye exhibits no discharge. No scleral icterus.  Neck: Normal range of motion. Neck supple. No JVD present. Carotid bruit is not present.  Cardiovascular: Normal rate, regular rhythm and normal heart sounds.  Pulmonary/Chest: Effort normal and breath sounds normal.  Abdominal: Normal appearance. There is no splenomegaly or hepatomegaly.  Musculoskeletal: Normal range of motion.  Neurological: She is alert and oriented to person, place, and time.  Skin: Skin is warm, dry and intact. No rash noted. No pallor.  Psychiatric: She has a normal mood and affect. Her behavior is normal. Judgment and thought content normal.    Results for orders placed or performed in visit on 03/02/18  TSH  Result Value Ref Range   TSH 0.538 0.450 - 4.500 uIU/mL  CBC with Differential/Platelet  Result Value Ref Range   WBC 4.3 3.4 - 10.8 x10E3/uL   RBC 3.77 3.77 - 5.28 x10E6/uL   Hemoglobin 11.0 (L) 11.1 - 15.9 g/dL   Hematocrit 33.1 (L) 34.0 - 46.6 %   MCV 88 79 - 97 fL   MCH 29.2 26.6 - 33.0 pg   MCHC 33.2 31.5 - 35.7 g/dL   RDW 14.6 12.3 - 15.4 %  Platelets 52 (LL) 150 - 379 x10E3/uL   Neutrophils 56 Not Estab. %   Lymphs 30 Not Estab. %   Monocytes 10 Not Estab. %   Eos 3 Not Estab. %   Basos 0 Not Estab. %   Neutrophils Absolute 2.4 1.4 - 7.0 x10E3/uL   Lymphocytes Absolute 1.3 0.7 - 3.1 x10E3/uL   Monocytes Absolute 0.4 0.1 - 0.9 x10E3/uL   EOS (ABSOLUTE) 0.1 0.0 - 0.4 x10E3/uL   Basophils Absolute 0.0 0.0 - 0.2 x10E3/uL   Immature Granulocytes 1 Not Estab. %   Immature Grans (Abs) 0.0 0.0 - 0.1 x10E3/uL   Hematology Comments: Note:   Comprehensive metabolic panel  Result Value Ref Range   Glucose 95 65 - 99 mg/dL   BUN 12 8 - 27 mg/dL   Creatinine, Ser 1.69 (H) 0.57 - 1.00 mg/dL   GFR calc non Af Amer 31 (L) >59 mL/min/1.73   GFR calc Af Amer 35 (L) >59 mL/min/1.73   BUN/Creatinine Ratio 7 (L) 12 - 28   Sodium 135 134 - 144 mmol/L   Potassium 4.1 3.5 - 5.2 mmol/L    Chloride 98 96 - 106 mmol/L   CO2 20 20 - 29 mmol/L   Calcium 9.1 8.7 - 10.3 mg/dL   Total Protein 6.8 6.0 - 8.5 g/dL   Albumin 4.0 3.6 - 4.8 g/dL   Globulin, Total 2.8 1.5 - 4.5 g/dL   Albumin/Globulin Ratio 1.4 1.2 - 2.2   Bilirubin Total <0.2 0.0 - 1.2 mg/dL   Alkaline Phosphatase 63 39 - 117 IU/L   AST 13 0 - 40 IU/L   ALT 6 0 - 32 IU/L  Lipid Panel w/o Chol/HDL Ratio  Result Value Ref Range   Cholesterol, Total 148 100 - 199 mg/dL   Triglycerides 60 0 - 149 mg/dL   HDL 42 >39 mg/dL   VLDL Cholesterol Cal 12 5 - 40 mg/dL   LDL Calculated 94 0 - 99 mg/dL      Assessment & Plan:   Problem List Items Addressed This Visit      Unprioritized   Chronic leg pain (Chronic)    Improved with Gabapentin.  Usually doesn't need it 3 times/day      Relevant Medications   gabapentin (NEURONTIN) 300 MG capsule   Hypertension    Decrease Lisinopril to 5 mg due to low normal BP.  F/u with Nephrology      Weight loss    Improved.  Pending colonoscopy and low dose CT. Recheck in 3 months          Follow up plan: Return in about 3 months (around 07/21/2018).

## 2018-04-20 NOTE — Assessment & Plan Note (Signed)
Improved.  Pending colonoscopy and low dose CT. Recheck in 3 months

## 2018-04-20 NOTE — Assessment & Plan Note (Signed)
Decrease Lisinopril to 5 mg due to low normal BP.  F/u with Nephrology

## 2018-04-20 NOTE — Assessment & Plan Note (Signed)
Improved with Gabapentin.  Usually doesn't need it 3 times/day

## 2018-04-21 ENCOUNTER — Ambulatory Visit: Payer: Medicare Other | Admitting: Unknown Physician Specialty

## 2018-05-11 ENCOUNTER — Ambulatory Visit: Admission: RE | Admit: 2018-05-11 | Payer: Medicare Other | Source: Ambulatory Visit

## 2018-05-21 ENCOUNTER — Encounter: Payer: Self-pay | Admitting: *Deleted

## 2018-06-01 DIAGNOSIS — D631 Anemia in chronic kidney disease: Secondary | ICD-10-CM | POA: Diagnosis not present

## 2018-06-01 DIAGNOSIS — N183 Chronic kidney disease, stage 3 (moderate): Secondary | ICD-10-CM | POA: Diagnosis not present

## 2018-06-01 DIAGNOSIS — I10 Essential (primary) hypertension: Secondary | ICD-10-CM | POA: Diagnosis not present

## 2018-06-01 DIAGNOSIS — E872 Acidosis: Secondary | ICD-10-CM | POA: Diagnosis not present

## 2018-06-29 ENCOUNTER — Ambulatory Visit: Admission: RE | Admit: 2018-06-29 | Payer: Medicare Other | Source: Ambulatory Visit | Admitting: Gastroenterology

## 2018-06-29 ENCOUNTER — Encounter: Admission: RE | Payer: Self-pay | Source: Ambulatory Visit

## 2018-06-29 SURGERY — COLONOSCOPY WITH PROPOFOL
Anesthesia: General

## 2018-07-12 ENCOUNTER — Other Ambulatory Visit: Payer: Self-pay | Admitting: Unknown Physician Specialty

## 2018-07-13 NOTE — Telephone Encounter (Signed)
Risperidone refill Last Refill:04/12/18 # 90 Last OV: 09/13/17 PCP: last seen by [place]

## 2018-07-14 NOTE — Telephone Encounter (Signed)
Pt calling to check on this.  Pt states she only has one pill left.

## 2018-07-14 NOTE — Telephone Encounter (Signed)
She needs to establish with new provider before more refills.

## 2018-07-30 ENCOUNTER — Ambulatory Visit (INDEPENDENT_AMBULATORY_CARE_PROVIDER_SITE_OTHER): Payer: Medicare Other | Admitting: Unknown Physician Specialty

## 2018-07-30 ENCOUNTER — Encounter: Payer: Self-pay | Admitting: Unknown Physician Specialty

## 2018-07-30 VITALS — BP 104/68 | HR 48 | Temp 97.6°F | Ht 62.0 in | Wt 139.6 lb

## 2018-07-30 DIAGNOSIS — G8929 Other chronic pain: Secondary | ICD-10-CM | POA: Diagnosis not present

## 2018-07-30 DIAGNOSIS — M79606 Pain in leg, unspecified: Secondary | ICD-10-CM | POA: Diagnosis not present

## 2018-07-30 DIAGNOSIS — R634 Abnormal weight loss: Secondary | ICD-10-CM | POA: Diagnosis not present

## 2018-07-30 DIAGNOSIS — Z23 Encounter for immunization: Secondary | ICD-10-CM | POA: Diagnosis not present

## 2018-07-30 DIAGNOSIS — R001 Bradycardia, unspecified: Secondary | ICD-10-CM | POA: Diagnosis not present

## 2018-07-30 MED ORDER — ATORVASTATIN CALCIUM 10 MG PO TABS: 10.0000 mg | ORAL_TABLET | Freq: Every day | ORAL | 1 refills | Status: DC

## 2018-07-30 MED ORDER — GABAPENTIN 300 MG PO CAPS: 300.0000 mg | ORAL_CAPSULE | Freq: Three times a day (TID) | ORAL | 3 refills | Status: DC

## 2018-07-30 NOTE — Assessment & Plan Note (Signed)
Resolved with recent weight gain

## 2018-07-30 NOTE — Progress Notes (Signed)
BP 104/68   Pulse (!) 48   Temp 97.6 F (36.4 C) (Oral)   Ht 5\' 2"  (1.575 m)   Wt 139 lb 9.6 oz (63.3 kg)   LMP  (LMP Unknown)   SpO2 99%   BMI 25.53 kg/m    Subjective:    Patient ID: Denise Everett, female    DOB: 05-07-1951, 67 y.o.   MRN: [id number]  HPI: Denise Everett is a 67 y.o. female  Chief Complaint  Patient presents with  . Hypertension  . Pain   Leg Pain This has been chronic and ongoing.  On multiple medications in the past including Opioids.  6 months ago we started on Gabapentin and states "that really helps."  Weight loss This is resolved.  States appetite is back.  She is now cooking and eating.    CKD Being seen every 6 months.   Relevant past medical, surgical, family and social history reviewed and updated as indicated. Interim medical history since our last visit reviewed. Allergies and medications reviewed and updated.  Review of Systems  Per HPI unless specifically indicated above     Objective:    BP 104/68   Pulse (!) 48   Temp 97.6 F (36.4 C) (Oral)   Ht 5\' 2"  (1.575 m)   Wt 139 lb 9.6 oz (63.3 kg)   LMP  (LMP Unknown)   SpO2 99%   BMI 25.53 kg/m   Wt Readings from Last 3 Encounters:  07/30/18 139 lb 9.6 oz (63.3 kg)  04/20/18 128 lb 3.2 oz (58.2 kg)  03/02/18 123 lb 6.4 oz (56 kg)    Physical Exam  Constitutional: She is oriented to person, place, and time. She appears well-developed and well-nourished. No distress.  HENT:  Head: Normocephalic and atraumatic.  Eyes: Conjunctivae and lids are normal. Right eye exhibits no discharge. Left eye exhibits no discharge. No scleral icterus.  Neck: Normal range of motion. Neck supple. No JVD present. Carotid bruit is not present.  Cardiovascular: Normal rate, regular rhythm and normal heart sounds.  Pulmonary/Chest: Effort normal and breath sounds normal.  Abdominal: Normal appearance. There is no splenomegaly or hepatomegaly.  Musculoskeletal: Normal range of motion.    Neurological: She is alert and oriented to person, place, and time.  Skin: Skin is warm, dry and intact. No rash noted. No pallor.  Psychiatric: She has a normal mood and affect. Her behavior is normal. Judgment and thought content normal.   EKG" No STTW changes.  Sinus bradycardia without heart block  Results for orders placed or performed in visit on 03/02/18  TSH  Result Value Ref Range   TSH 0.538 0.450 - 4.500 uIU/mL  CBC with Differential/Platelet  Result Value Ref Range   WBC 4.3 3.4 - 10.8 x10E3/uL   RBC 3.77 3.77 - 5.28 x10E6/uL   Hemoglobin 11.0 (L) 11.1 - 15.9 g/dL   Hematocrit 33.1 (L) 34.0 - 46.6 %   MCV 88 79 - 97 fL   MCH 29.2 26.6 - 33.0 pg   MCHC 33.2 31.5 - 35.7 g/dL   RDW 14.6 12.3 - 15.4 %   Platelets 52 (LL) 150 - 379 x10E3/uL   Neutrophils 56 Not Estab. %   Lymphs 30 Not Estab. %   Monocytes 10 Not Estab. %   Eos 3 Not Estab. %   Basos 0 Not Estab. %   Neutrophils Absolute 2.4 1.4 - 7.0 x10E3/uL   Lymphocytes Absolute 1.3 0.7 - 3.1 x10E3/uL  Monocytes Absolute 0.4 0.1 - 0.9 x10E3/uL   EOS (ABSOLUTE) 0.1 0.0 - 0.4 x10E3/uL   Basophils Absolute 0.0 0.0 - 0.2 x10E3/uL   Immature Granulocytes 1 Not Estab. %   Immature Grans (Abs) 0.0 0.0 - 0.1 x10E3/uL   Hematology Comments: Note:   Comprehensive metabolic panel  Result Value Ref Range   Glucose 95 65 - 99 mg/dL   BUN 12 8 - 27 mg/dL   Creatinine, Ser 1.69 (H) 0.57 - 1.00 mg/dL   GFR calc non Af Amer 31 (L) >59 mL/min/1.73   GFR calc Af Amer 35 (L) >59 mL/min/1.73   BUN/Creatinine Ratio 7 (L) 12 - 28   Sodium 135 134 - 144 mmol/L   Potassium 4.1 3.5 - 5.2 mmol/L   Chloride 98 96 - 106 mmol/L   CO2 20 20 - 29 mmol/L   Calcium 9.1 8.7 - 10.3 mg/dL   Total Protein 6.8 6.0 - 8.5 g/dL   Albumin 4.0 3.6 - 4.8 g/dL   Globulin, Total 2.8 1.5 - 4.5 g/dL   Albumin/Globulin Ratio 1.4 1.2 - 2.2   Bilirubin Total <0.2 0.0 - 1.2 mg/dL   Alkaline Phosphatase 63 39 - 117 IU/L   AST 13 0 - 40 IU/L   ALT 6 0 -  32 IU/L  Lipid Panel w/o Chol/HDL Ratio  Result Value Ref Range   Cholesterol, Total 148 100 - 199 mg/dL   Triglycerides 60 0 - 149 mg/dL   HDL 42 >39 mg/dL   VLDL Cholesterol Cal 12 5 - 40 mg/dL   LDL Calculated 94 0 - 99 mg/dL      Assessment & Plan:   Problem List Items Addressed This Visit      Unprioritized   Bradycardia    Symptomatic.  EKG without heart block.  Will continue to monitor      Relevant Orders   EKG 12-Lead (Completed)   Chronic leg pain (Chronic)    Controlled with Gbapentin.  Continue present medication.        Relevant Medications   gabapentin (NEURONTIN) 300 MG capsule   RESOLVED: Weight loss    Resolved with recent weight gain       Other Visit Diagnoses    Need for influenza vaccination    -  Primary   Relevant Orders   Flu vaccine HIGH DOSE PF (Completed)       Follow up plan: Return in about 6 months (around 01/28/2019).

## 2018-07-30 NOTE — Assessment & Plan Note (Signed)
Controlled with Gbapentin.  Continue present medication.

## 2018-07-30 NOTE — Assessment & Plan Note (Signed)
Symptomatic.  EKG without heart block.  Will continue to monitor

## 2018-07-30 NOTE — Patient Instructions (Addendum)
Influenza (Flu) Vaccine (Inactivated or Recombinant): What You Need to Know 1. Why get vaccinated? Influenza ("flu") is a contagious disease that spreads around the [place] every year, usually between October and May. Flu is caused by influenza viruses, and is spread mainly by coughing, sneezing, and close contact. Anyone can get flu. Flu strikes suddenly and can last several days. Symptoms vary by age, but can include:  fever/chills  sore throat  muscle aches  fatigue  cough  headache  runny or stuffy nose  Flu can also lead to pneumonia and blood infections, and cause diarrhea and seizures in children. If you have a medical condition, such as heart or lung disease, flu can make it worse. Flu is more dangerous for some people. Infants and young children, people 23 years of age and older, pregnant women, and people with certain health conditions or a weakened immune system are at greatest risk. Each year thousands of people in the [place] die from flu, and many more are hospitalized. Flu vaccine can:  keep you from getting flu,  make flu less severe if you do get it, and  keep you from spreading flu to your family and other people. 2. Inactivated and recombinant flu vaccines A dose of flu vaccine is recommended every flu season. Children 6 months through 91 years of age may need two doses during the same flu season. Everyone else needs only one dose each flu season. Some inactivated flu vaccines contain a very small amount of a mercury-based preservative called thimerosal. Studies have not shown thimerosal in vaccines to be harmful, but flu vaccines that do not contain thimerosal are available. There is no live flu virus in flu shots. They cannot cause the flu. There are many flu viruses, and they are always changing. Each year a new flu vaccine is made to protect against three or four viruses that are likely to cause disease in the upcoming flu season. But even when the  vaccine doesn't exactly match these viruses, it may still provide some protection. Flu vaccine cannot prevent:  flu that is caused by a virus not covered by the vaccine, or  illnesses that look like flu but are not.  It takes about 2 weeks for protection to develop after vaccination, and protection lasts through the flu season. 3. Some people should not get this vaccine Tell the person who is giving you the vaccine:  If you have any severe, life-threatening allergies. If you ever had a life-threatening allergic reaction after a dose of flu vaccine, or have a severe allergy to any part of this vaccine, you may be advised not to get vaccinated. Most, but not all, types of flu vaccine contain a small amount of egg protein.  If you ever had Guillain-Barr Syndrome (also called GBS). Some people with a history of GBS should not get this vaccine. This should be discussed with your doctor.  If you are not feeling well. It is usually okay to get flu vaccine when you have a mild illness, but you might be asked to come back when you feel better.  4. Risks of a vaccine reaction With any medicine, including vaccines, there is a chance of reactions. These are usually mild and go away on their own, but serious reactions are also possible. Most people who get a flu shot do not have any problems with it. Minor problems following a flu shot include:  soreness, redness, or swelling where the shot was given  hoarseness  sore,  red or itchy eyes  cough  fever  aches  headache  itching  fatigue  If these problems occur, they usually begin soon after the shot and last 1 or 2 days. More serious problems following a flu shot can include the following:  There may be a small increased risk of Guillain-Barre Syndrome (GBS) after inactivated flu vaccine. This risk has been estimated at 1 or 2 additional cases per million people vaccinated. This is much lower than the risk of severe complications from  flu, which can be prevented by flu vaccine.  Young children who get the flu shot along with pneumococcal vaccine (PCV13) and/or DTaP vaccine at the same time might be slightly more likely to have a seizure caused by fever. Ask your doctor for more information. Tell your doctor if a child who is getting flu vaccine has ever had a seizure.  Problems that could happen after any injected vaccine:  People sometimes faint after a medical procedure, including vaccination. Sitting or lying down for about 15 minutes can help prevent fainting, and injuries caused by a fall. Tell your doctor if you feel dizzy, or have vision changes or ringing in the ears.  Some people get severe pain in the shoulder and have difficulty moving the arm where a shot was given. This happens very rarely.  Any medication can cause a severe allergic reaction. Such reactions from a vaccine are very rare, estimated at about 1 in a million doses, and would happen within a few minutes to a few hours after the vaccination. As with any medicine, there is a very remote chance of a vaccine causing a serious injury or death. The safety of vaccines is always being monitored. For more information, visit: http://www.aguilar.org/ 5. What if there is a serious reaction? What should I look for? Look for anything that concerns you, such as signs of a severe allergic reaction, very high fever, or unusual behavior. Signs of a severe allergic reaction can include hives, swelling of the face and throat, difficulty breathing, a fast heartbeat, dizziness, and weakness. These would start a few minutes to a few hours after the vaccination. What should I do?  If you think it is a severe allergic reaction or other emergency that can't wait, call 9-1-1 and get the person to the nearest hospital. Otherwise, call your doctor.  Reactions should be reported to the Vaccine Adverse Event Reporting System (VAERS). Your doctor should file this report, or you  can do it yourself through the VAERS web site at www.vaers.SamedayNews.es, or by calling 6094730752. ? VAERS does not give medical advice. 6. The National Vaccine Injury Compensation Program The [employer] Vaccine Injury Compensation Program (VICP) is a federal program that was created to compensate people who may have been injured by certain vaccines. Persons who believe they may have been injured by a vaccine can learn about the program and about filing a claim by calling 458-267-6070 or visiting the [place] website at GoldCloset.com.ee. There is a time limit to file a claim for compensation. 7. How can I learn more?  Ask your healthcare provider. He or she can give you the vaccine package insert or suggest other sources of information.  Call your local or state health department.  Contact the Centers for Disease Control and Prevention (CDC): ? Call (540)164-9661 (1-800-CDC-INFO) or ? Visit CDC's website at https://gibson.com/ Vaccine Information Statement, Inactivated Influenza Vaccine (06/16/2014) This information is not intended to replace advice given to you by your health care provider. Make sure  you discuss any questions you have with your health care provider. ------------------------------------------------------------- Please do call to schedule your mammogram; the number to schedule one at either [facility] or [facility] Outpatient Radiology is [phone]

## 2018-08-09 ENCOUNTER — Other Ambulatory Visit: Payer: Self-pay | Admitting: Physician Assistant

## 2018-08-09 ENCOUNTER — Other Ambulatory Visit: Payer: Self-pay | Admitting: Unknown Physician Specialty

## 2018-08-09 NOTE — Telephone Encounter (Signed)
Lisinopril 10 mg tab  refill Last Refill:04/14/18 # 90 Last OV: 07/30/18     NOV  01/28/19  With Marnee Guarneri PCP: former pt of [facility]  Per chart visit on 04/20/18, lisinopril was supposed to be reduced to 5 mg  Daily. The med list does not reflect this change. Please clarify.

## 2018-08-10 ENCOUNTER — Other Ambulatory Visit: Payer: Self-pay | Admitting: Unknown Physician Specialty

## 2018-08-10 MED ORDER — LISINOPRIL 10 MG PO TABS: 10.0000 mg | ORAL_TABLET | Freq: Every day | ORAL | 1 refills | Status: DC

## 2018-08-10 MED ORDER — RALOXIFENE HCL 60 MG PO TABS: 60.0000 mg | ORAL_TABLET | Freq: Every day | ORAL | 3 refills | Status: DC

## 2018-08-10 NOTE — Telephone Encounter (Signed)
Requested medication (s) are due for refill today yes  Requested medication (s) are on the active medication list yes  Future visit scheduled yes   Requested Prescriptions  Pending Prescriptions Disp Refills   risperiDONE (RISPERDAL) 2 MG tablet 30 tablet 0    Sig: TAKE ONE-HALF TO ONE TABLET BY MOUTH AT BEDTIME     Not Delegated - Psychiatry:  Antipsychotics - Second Generation (Atypical) - risperidone Failed - 08/10/2018 11:58 AM      Failed - This refill cannot be delegated      Failed - Prolactin Level (serum) in normal range and within 180 days    No results found for: PROLACTIN, TOTPROLACTIN       Passed - ALT in normal range and within 180 days    ALT  Date Value Ref Range Status  03/02/2018 6 0 - 32 IU/L Final         Passed - AST in normal range and within 180 days    AST  Date Value Ref Range Status  03/02/2018 13 0 - 40 IU/L Final         Passed - Valid encounter within last 6 months    Recent Outpatient Visits          1 week ago Need for influenza vaccination   [facility] [name], NP   3 months ago Essential hypertension   [facility] [name], NP   5 months ago Viral upper respiratory tract infection   [facility] [name], NP   10 months ago Schizoaffective disorder, unspecified type ([place])   [place] [name], NP   1 year ago Hip tendonitis, left   [facility] [name], NP      Future Appointments            In 5 months Cannady, Barbaraann Faster, NP [facility]         Signed Prescriptions Disp Refills   lisinopril (PRINIVIL,ZESTRIL) 10 MG tablet 90 tablet 1    Sig: Take 1 tablet (10 mg total) by mouth daily.     Cardiovascular:  ACE Inhibitors Failed - 08/10/2018 11:58 AM      Failed - Cr in normal range and within 180 days    Creatinine  Date Value Ref Range Status  06/23/2014 1.57 (H) 0.60 - 1.30 mg/dL Final   Creatinine, Ser   Date Value Ref Range Status  03/02/2018 1.69 (H) 0.57 - 1.00 mg/dL Final         Passed - K in normal range and within 180 days    Potassium  Date Value Ref Range Status  03/02/2018 4.1 3.5 - 5.2 mmol/L Final  06/23/2014 5.0 3.5 - 5.1 mmol/L Final         Passed - Patient is not pregnant      Passed - Last BP in normal range    BP Readings from Last 1 Encounters:  07/30/18 104/68         Passed - Valid encounter within last 6 months    Recent Outpatient Visits          1 week ago Need for influenza vaccination   [facility] [name], NP   3 months ago Essential hypertension   [facility] [name], NP   5 months ago Viral upper respiratory tract infection   [facility] [name], NP   10 months ago Schizoaffective disorder, unspecified type (  [place])   [facility] [name], NP   1 year ago Hip tendonitis, left   [facility] [name], NP      Future Appointments            In 5 months Cannady, Barbaraann Faster, NP [facility]          raloxifene (EVISTA) 60 MG tablet 90 tablet 3    Sig: Take 1 tablet (60 mg total) by mouth daily.     OB/GYN:  Selective Estrogen Receptor Modulators Passed - 08/10/2018 11:58 AM      Passed - Valid encounter within last 12 months    Recent Outpatient Visits          1 week ago Need for influenza vaccination   [facility] [name], NP   3 months ago Essential hypertension   [facility] [name], NP   5 months ago Viral upper respiratory tract infection   [facility] [name], NP   10 months ago Schizoaffective disorder, unspecified type [facility])   [place] [name], NP   1 year ago Hip tendonitis, left   [facility] [name], NP      Future Appointments            In 5 months Cannady, Barbaraann Faster, NP [facility]

## 2018-08-10 NOTE — Telephone Encounter (Signed)
Copied from [place] [phone]. Topic: Quick Communication - See Telephone Encounter >> Aug 10, 2018 11:33 AM Conception Chancy, NT wrote: CRM for notification. See Telephone encounter for: 08/10/18.  Patient is calling and requesting a refill on the following medications.  lisinopril (PRINIVIL,ZESTRIL) 10 MG tablet raloxifene (EVISTA) 60 MG tablet risperiDONE (RISPERDAL) 2 MG tablet  [facility] [place] [place] [address] [place] [address] Phone: [phone] Fax: [phone]

## 2018-08-11 MED ORDER — RISPERIDONE 2 MG PO TABS: ORAL_TABLET | ORAL | 5 refills | Status: DC

## 2018-09-30 ENCOUNTER — Ambulatory Visit: Payer: Medicare Other

## 2018-11-11 ENCOUNTER — Ambulatory Visit (INDEPENDENT_AMBULATORY_CARE_PROVIDER_SITE_OTHER): Payer: Medicare Other

## 2018-11-11 ENCOUNTER — Ambulatory Visit: Payer: Medicare Other

## 2018-11-11 VITALS — BP 118/60 | HR 73 | Temp 98.2°F | Ht 62.0 in | Wt 137.0 lb

## 2018-11-11 DIAGNOSIS — E2839 Other primary ovarian failure: Secondary | ICD-10-CM

## 2018-11-11 DIAGNOSIS — Z0000 Encounter for general adult medical examination without abnormal findings: Secondary | ICD-10-CM | POA: Diagnosis not present

## 2018-11-11 NOTE — Patient Instructions (Addendum)
Denise Everett , Thank you for taking time to come for your Medicare Wellness Visit. I appreciate your ongoing commitment to your health goals. Please review the following plan we discussed and let me know if I can assist you in the future.   Screening recommendations/referrals: Colonoscopy due, please schedule and make sure you have someone to pick you up and drop you off Mammogram due, please go when it is scheduled. Here is number for you to confirm the appointment [phone] at [facility] Bone Density up to date Recommended yearly ophthalmology/optometry visit for glaucoma screening and checkup Recommended yearly dental visit for hygiene and checkup  Vaccinations: Influenza vaccine up to date Pneumococcal vaccine up to date, completed Tdap vaccine up to date, due 07/27/2019 Shingles vaccine due, declined  Advanced directives: Advance directive discussed with you today. I have provided a copy for you to complete at home and have notarized. Once this is complete please bring a copy in to our office so we can scan it into your chart.  Conditions/risks identified: none  Next appointment: Cannady NP 01/28/2019 @ 1:30pm           Medicare Wellness Visit 11/16/2019 @ 11am   Preventive Care 70 Years and Older, Female Preventive care refers to lifestyle choices and visits with your health care provider that can promote health and wellness. What does preventive care include?  A yearly physical exam. This is also called an annual well check.  Dental exams once or twice a year.  Routine eye exams. Ask your health care provider how often you should have your eyes checked.  Personal lifestyle choices, including:  Daily care of your teeth and gums.  Regular physical activity.  Eating a healthy diet.  Avoiding tobacco and drug use.  Limiting alcohol use.  Practicing safe sex.  Taking low-dose aspirin every day.  Taking vitamin and mineral supplements as recommended by  your health care provider. What happens during an annual well check? The services and screenings done by your health care provider during your annual well check will depend on your age, overall health, lifestyle risk factors, and family history of disease. Counseling  Your health care provider may ask you questions about your:  Alcohol use.  Tobacco use.  Drug use.  Emotional well-being.  Home and relationship well-being.  Sexual activity.  Eating habits.  History of falls.  Memory and ability to understand (cognition).  Work and work Statistician.  Reproductive health. Screening  You may have the following tests or measurements:  Height, weight, and BMI.  Blood pressure.  Lipid and cholesterol levels. These may be checked every 5 years, or more frequently if you are over 45 years old.  Skin check.  Lung cancer screening. You may have this screening every year starting at age 67 if you have a 30-pack-year history of smoking and currently smoke or have quit within the past 15 years.  Fecal occult blood test (FOBT) of the stool. You may have this test every year starting at age 26.  Flexible sigmoidoscopy or colonoscopy. You may have a sigmoidoscopy every 5 years or a colonoscopy every 10 years starting at age 30.  Hepatitis C blood test.  Hepatitis B blood test.  Sexually transmitted disease (STD) testing.  Diabetes screening. This is done by checking your blood sugar (glucose) after you have not eaten for a while (fasting). You may have this done every 1-3 years.  Bone density scan. This is done to screen for osteoporosis. You  may have this done starting at age 43.  Mammogram. This may be done every 1-2 years. Talk to your health care provider about how often you should have regular mammograms. Talk with your health care provider about your test results, treatment options, and if necessary, the need for more tests. Vaccines  Your health care provider may  recommend certain vaccines, such as:  Influenza vaccine. This is recommended every year.  Tetanus, diphtheria, and acellular pertussis (Tdap, Td) vaccine. You may need a Td booster every 10 years.  Zoster vaccine. You may need this after age 60.  Pneumococcal 13-valent conjugate (PCV13) vaccine. One dose is recommended after age 35.  Pneumococcal polysaccharide (PPSV23) vaccine. One dose is recommended after age 67. Talk to your health care provider about which screenings and vaccines you need and how often you need them. This information is not intended to replace advice given to you by your health care provider. Make sure you discuss any questions you have with your health care provider. Document Released: 11/23/2015 Document Revised: 07/16/2016 Document Reviewed: 08/28/2015 Elsevier Interactive Patient Education  2017 [place] Prevention in the Home Falls can cause injuries. They can happen to people of all ages. There are many things you can do to make your home safe and to help prevent falls. What can I do on the outside of my home?  Regularly fix the edges of walkways and driveways and fix any cracks.  Remove anything that might make you trip as you walk through a door, such as a raised step or threshold.  Trim any bushes or trees on the path to your home.  Use bright outdoor lighting.  Clear any walking paths of anything that might make someone trip, such as rocks or tools.  Regularly check to see if handrails are loose or broken. Make sure that both sides of any steps have handrails.  Any raised decks and porches should have guardrails on the edges.  Have any leaves, snow, or ice cleared regularly.  Use sand or salt on walking paths during winter.  Clean up any spills in your garage right away. This includes oil or grease spills. What can I do in the bathroom?  Use night lights.  Install grab bars by the toilet and in the tub and shower. Do not use towel  bars as grab bars.  Use non-skid mats or decals in the tub or shower.  If you need to sit down in the shower, use a plastic, non-slip stool.  Keep the floor dry. Clean up any water that spills on the floor as soon as it happens.  Remove soap buildup in the tub or shower regularly.  Attach bath mats securely with double-sided non-slip rug tape.  Do not have throw rugs and other things on the floor that can make you trip. What can I do in the bedroom?  Use night lights.  Make sure that you have a light by your bed that is easy to reach.  Do not use any sheets or blankets that are too big for your bed. They should not hang down onto the floor.  Have a firm chair that has side arms. You can use this for support while you get dressed.  Do not have throw rugs and other things on the floor that can make you trip. What can I do in the kitchen?  Clean up any spills right away.  Avoid walking on wet floors.  Keep items that you use a lot  in easy-to-reach places.  If you need to reach something above you, use a strong step stool that has a grab bar.  Keep electrical cords out of the way.  Do not use floor polish or wax that makes floors slippery. If you must use wax, use non-skid floor wax.  Do not have throw rugs and other things on the floor that can make you trip. What can I do with my stairs?  Do not leave any items on the stairs.  Make sure that there are handrails on both sides of the stairs and use them. Fix handrails that are broken or loose. Make sure that handrails are as long as the stairways.  Check any carpeting to make sure that it is firmly attached to the stairs. Fix any carpet that is loose or worn.  Avoid having throw rugs at the top or bottom of the stairs. If you do have throw rugs, attach them to the floor with carpet tape.  Make sure that you have a light switch at the top of the stairs and the bottom of the stairs. If you do not have them, ask someone to  add them for you. What else can I do to help prevent falls?  Wear shoes that:  Do not have high heels.  Have rubber bottoms.  Are comfortable and fit you well.  Are closed at the toe. Do not wear sandals.  If you use a stepladder:  Make sure that it is fully opened. Do not climb a closed stepladder.  Make sure that both sides of the stepladder are locked into place.  Ask someone to hold it for you, if possible.  Clearly mark and make sure that you can see:  Any grab bars or handrails.  First and last steps.  Where the edge of each step is.  Use tools that help you move around (mobility aids) if they are needed. These include:  Canes.  Walkers.  Scooters.  Crutches.  Turn on the lights when you go into a dark area. Replace any light bulbs as soon as they burn out.  Set up your furniture so you have a clear path. Avoid moving your furniture around.  If any of your floors are uneven, fix them.  If there are any pets around you, be aware of where they are.  Review your medicines with your doctor. Some medicines can make you feel dizzy. This can increase your chance of falling. Ask your doctor what other things that you can do to help prevent falls. This information is not intended to replace advice given to you by your health care provider. Make sure you discuss any questions you have with your health care provider. Document Released: 08/23/2009 Document Revised: 04/03/2016 Document Reviewed: 12/01/2014 Elsevier Interactive Patient Education  2017 [employer].

## 2018-11-11 NOTE — Progress Notes (Signed)
Subjective:   Denise Everett is a 68 y.o. female who presents for Medicare Annual (Subsequent) preventive examination.    Objective:     Vitals: BP 118/60 (BP Location: Left Arm, Patient Position: Sitting)   Pulse 73   Temp 98.2 F (36.8 C) (Oral)   Ht 5\' 2"  (1.575 m)   Wt 137 lb (62.1 kg)   LMP  (LMP Unknown)   SpO2 97%   BMI 25.06 kg/m   Body mass index is 25.06 kg/m.  Advanced Directives 11/11/2018 11/24/2017 09/16/2017 05/29/2017 02/24/2017 01/14/2017 07/16/2016  Does Patient Have a Medical Advance Directive? No No No No No No No  Would patient like information on creating a medical advance directive? Yes (MAU/Ambulatory/Procedural Areas - Information given) - Yes (MAU/Ambulatory/Procedural Areas - Information given) No - Patient declined No - Patient declined No - Patient declined No - patient declined information    Tobacco Social History   Tobacco Use  Smoking Status Former Smoker  . Packs/day: 1.00  . Years: 43.00  . Pack years: 43.00  . Types: Cigarettes  . Last attempt to quit: 07/21/2017  . Years since quitting: 1.3  Smokeless Tobacco Former Systems developer  . Types: Snuff     Counseling given: Not Answered   Clinical Intake:  Pre-visit preparation completed: No  Pain : No/denies pain     Nutritional Risks: None Diabetes: No  How often do you need to have someone help you when you read instructions, pamphlets, or other written materials from your doctor or pharmacy?: 3 - Sometimes What is the last grade level you completed in school?: 6th grade  Interpreter Needed?: No  Information entered by :: Tyson Dense, RN  Past Medical History:  Diagnosis Date  . Abnormal weight loss 06/07/2015  . Allergy   . Cataract   . Chronic kidney disease   . Chronic pain syndrome   . Hyperlipidemia   . Hypertension   . Leg pain   . Osteoporosis    hips  . Renal insufficiency   . Schizophrenia ([place])   . Thrombocytopenia ([place])   . Tobacco use disorder    Past Surgical  History:  Procedure Laterality Date  . ankle surgery Right    x's 2  . CATARACT EXTRACTION     Family History  Problem Relation Age of Onset  . Hyperlipidemia Mother   . Hypertension Mother    Social History   Socioeconomic History  . Marital status: Single    Spouse name: Not on file  . Number of children: Not on file  . Years of education: Not on file  . Highest education level: Not on file  Occupational History  . Not on file  Social Needs  . Financial resource strain: Not hard at all  . Food insecurity:    Worry: Never true    Inability: Never true  . Transportation needs:    Medical: No    Non-medical: No  Tobacco Use  . Smoking status: Former Smoker    Packs/day: 1.00    Years: 43.00    Pack years: 43.00    Types: Cigarettes    Last attempt to quit: 07/21/2017    Years since quitting: 1.3  . Smokeless tobacco: Former Systems developer    Types: Snuff  Substance and Sexual Activity  . Alcohol use: No  . Drug use: No  . Sexual activity: Yes  Lifestyle  . Physical activity:    Days per week: 5 days    Minutes per  session: 40 min  . Stress: Not at all  Relationships  . Social connections:    Talks on phone: More than three times a week    Gets together: More than three times a week    Attends religious service: Never    Active member of club or organization: No    Attends meetings of clubs or organizations: Never    Relationship status: Divorced  Other Topics Concern  . Not on file  Social History Narrative  . Not on file    Outpatient Encounter Medications as of 11/11/2018  Medication Sig  . acetaminophen (TYLENOL) 325 MG tablet Take 650 mg every 6 (six) hours as needed by mouth.  . Aspirin-Acetaminophen-Caffeine (GOODYS EXTRA STRENGTH) [phone] MG PACK Take by mouth.  Marland Kitchen atorvastatin (LIPITOR) 10 MG tablet Take 1 tablet (10 mg total) by mouth daily at 6 PM.  . gabapentin (NEURONTIN) 300 MG capsule Take 1 capsule (300 mg total) by mouth 3 (three) times daily.  Start with 1 daily for 3 days and gradually increase to three times/day  . raloxifene (EVISTA) 60 MG tablet Take 1 tablet (60 mg total) by mouth daily.  . risperiDONE (RISPERDAL) 2 MG tablet TAKE 1/2 TO 1 TABLET BY MOUTH AT BEDTIME  . lisinopril (PRINIVIL,ZESTRIL) 10 MG tablet Take 0.5 tablets (5 mg total) by mouth daily.   No facility-administered encounter medications on file as of 11/11/2018.     Activities of Daily Living In your present state of health, do you have any difficulty performing the following activities: 11/11/2018  Hearing? N  Vision? N  Difficulty concentrating or making decisions? N  Walking or climbing stairs? N  Dressing or bathing? N  Doing errands, shopping? N  Preparing Food and eating ? N  Using the Toilet? N  In the past six months, have you accidently leaked urine? N  Do you have problems with loss of bowel control? N  Managing your Medications? N  Managing your Finances? N  Housekeeping or managing your Housekeeping? N  Some recent data might be hidden    Patient Care Team: [name], NP as PCP - General (Nurse Practitioner) Lloyd Huger, MD as Consulting Physician (Oncology)    Assessment:   This is a routine wellness examination for Denise Everett.  Exercise Activities and Dietary recommendations Current Exercise Habits: Home exercise routine, Type of exercise: walking, Time (Minutes): 45, Frequency (Times/Week): 5, Weekly Exercise (Minutes/Week): 225, Intensity: Mild, Exercise limited by: None identified  Goals    . Decrease soda or juice intake     Decrease soda intake    . Increase water intake     Recommend drinking at least 6-7 glasses of water a day        Fall Risk Fall Risk  11/11/2018 09/16/2017 04/16/2017 02/24/2017 07/07/2016  Falls in the past year? 0 No Yes No No  Number falls in past yr: - - 1 - -  Injury with Fall? - - Yes - -  Comment - - bruised left knee.  had xray - -  Risk for fall due to : - - (No Data) - -  Risk for  fall due to: Comment - - fell off of  he bed - -  Follow up - - Falls prevention discussed - -   Is the patient's home free of loose throw rugs in walkways, pet beds, electrical cords, etc?   yes      Grab bars in the bathroom? no  Handrails on the stairs?   yes      Adequate lighting?   yes  Depression Screen PHQ 2/9 Scores 11/11/2018 09/16/2017 04/16/2017 02/24/2017  PHQ - 2 Score 0 1 0 0  PHQ- 9 Score - 2 - -     Cognitive Function     6CIT Screen 11/11/2018 09/16/2017  What Year? 0 points 0 points  What month? 0 points 0 points  What time? 0 points 0 points  Count back from 20 0 points 0 points  Months in reverse 2 points 0 points  Repeat phrase 4 points 8 points  Total Score 6 8    Immunization History  Administered Date(s) Administered  . Influenza, High Dose Seasonal PF 08/08/2016, 09/16/2017, 07/30/2018  . Influenza,inj,Quad PF,6+ Mos 01/01/2016  . Pneumococcal Conjugate-13 10/16/2014  . Pneumococcal Polysaccharide-23 01/01/2016  . Td 04/15/1999, 07/26/2009    Qualifies for Shingles Vaccine? Yes, educated and declined  Screening Tests Health Maintenance  Topic Date Due  . COLONOSCOPY  08/14/2013  . MAMMOGRAM  10/21/2018  . COLON CANCER SCREENING ANNUAL FOBT  01/19/2019  . TETANUS/TDAP  07/27/2019  . INFLUENZA VACCINE  Completed  . DEXA SCAN  Completed  . Hepatitis C Screening  Completed  . PNA vac Low Risk Adult  Completed    Cancer Screenings: Lung: Low Dose CT Chest recommended if Age 31-80 years, 30 pack-year currently smoking OR have quit w/in 15years. Patient does not qualify. Breast:  Up to date on Mammogram? No , patient said it's already scheduled Up to date of Bone Density/Dexa? Yes, patient has diagnosis of osteoporosis  Colorectal: due, patient will call to schedule  Additional Screenings:  Hepatitis C Screening: declined     Plan:    I have personally reviewed and addressed the Medicare Annual Wellness questionnaire and have noted the  following in the patient's chart:  A. Medical and social history B. Use of alcohol, tobacco or illicit drugs  C. Current medications and supplements D. Functional ability and status E.  Nutritional status F.  Physical activity G. Advance directives H. List of other physicians I.  Hospitalizations, surgeries, and ER visits in previous 12 months J.  [place] to include hearing, vision, cognitive, depression L. Referrals and appointments - none  In addition, I have reviewed and discussed with patient certain preventive protocols, quality metrics, and best practice recommendations. A written personalized care plan for preventive services as well as general preventive health recommendations were provided to patient.  See attached scanned questionnaire for additional information.   Signed,   Tyson Dense, RN Nurse Health Advisor  Patient concerns: None

## 2019-01-28 ENCOUNTER — Encounter: Payer: Self-pay | Admitting: Nurse Practitioner

## 2019-01-28 ENCOUNTER — Other Ambulatory Visit: Payer: Self-pay

## 2019-01-28 ENCOUNTER — Ambulatory Visit (INDEPENDENT_AMBULATORY_CARE_PROVIDER_SITE_OTHER): Payer: Medicare Other | Admitting: Nurse Practitioner

## 2019-01-28 VITALS — BP 129/78 | HR 80 | Temp 98.3°F | Ht 62.0 in | Wt 137.0 lb

## 2019-01-28 DIAGNOSIS — Z1329 Encounter for screening for other suspected endocrine disorder: Secondary | ICD-10-CM | POA: Diagnosis not present

## 2019-01-28 DIAGNOSIS — I10 Essential (primary) hypertension: Secondary | ICD-10-CM | POA: Diagnosis not present

## 2019-01-28 DIAGNOSIS — G8929 Other chronic pain: Secondary | ICD-10-CM

## 2019-01-28 DIAGNOSIS — E7800 Pure hypercholesterolemia, unspecified: Secondary | ICD-10-CM

## 2019-01-28 DIAGNOSIS — Z1239 Encounter for other screening for malignant neoplasm of breast: Secondary | ICD-10-CM

## 2019-01-28 DIAGNOSIS — D693 Immune thrombocytopenic purpura: Secondary | ICD-10-CM

## 2019-01-28 DIAGNOSIS — M4696 Unspecified inflammatory spondylopathy, lumbar region: Secondary | ICD-10-CM | POA: Insufficient documentation

## 2019-01-28 DIAGNOSIS — F209 Schizophrenia, unspecified: Secondary | ICD-10-CM

## 2019-01-28 DIAGNOSIS — Z1211 Encounter for screening for malignant neoplasm of colon: Secondary | ICD-10-CM

## 2019-01-28 DIAGNOSIS — M79606 Pain in leg, unspecified: Secondary | ICD-10-CM | POA: Diagnosis not present

## 2019-01-28 MED ORDER — ATORVASTATIN CALCIUM 10 MG PO TABS: 10.0000 mg | ORAL_TABLET | Freq: Every day | ORAL | 1 refills | Status: DC

## 2019-01-28 MED ORDER — LISINOPRIL 10 MG PO TABS: 5.0000 mg | ORAL_TABLET | Freq: Every day | ORAL | 3 refills | Status: DC

## 2019-01-28 MED ORDER — GABAPENTIN 300 MG PO CAPS: 300.0000 mg | ORAL_CAPSULE | Freq: Three times a day (TID) | ORAL | 3 refills | Status: DC

## 2019-01-28 MED ORDER — RISPERIDONE 2 MG PO TABS: 1.0000 mg | ORAL_TABLET | Freq: Every day | ORAL | 5 refills | Status: DC

## 2019-01-28 NOTE — Assessment & Plan Note (Signed)
Chronic, ongoing, well-controlled on current Gabapentin.  Continue current medication regimen.

## 2019-01-28 NOTE — Assessment & Plan Note (Signed)
Chronic, ongoing.  Continue current medication regimen.  Lipid panel and CMP today.

## 2019-01-28 NOTE — Assessment & Plan Note (Signed)
Chronic, ongoing.  Continue current medication regimen.  BP below goal.

## 2019-01-28 NOTE — Assessment & Plan Note (Signed)
Have scheduled appointment for April 29th with Dr. Grayland Ormond and will obtain CBC today, if PLT low to notify him and will schedule sooner.

## 2019-01-28 NOTE — Assessment & Plan Note (Signed)
Chronic, ongoing.  Well-controlled on current Risperdal dosing.  Continue medication regimen.

## 2019-01-28 NOTE — Patient Instructions (Addendum)
Please call to this number to schedule you mammogram. [phone] Fat and Cholesterol Restricted Eating Plan Getting too much fat and cholesterol in your diet may cause health problems. Choosing the right foods helps keep your fat and cholesterol at normal levels. This can keep you from getting certain diseases. Your doctor may recommend an eating plan that includes:  Total fat: ______% or less of total calories a day.  Saturated fat: ______% or less of total calories a day.  Cholesterol: less than _________mg a day.  Fiber: ______g a day. What are tips for following this plan? Meal planning  At meals, divide your plate into four equal parts: ? Fill one-half of your plate with vegetables and green salads. ? Fill one-fourth of your plate with whole grains. ? Fill one-fourth of your plate with low-fat (lean) protein foods.  Eat fish that is high in omega-3 fats at least two times a week. This includes mackerel, tuna, sardines, and salmon.  Eat foods that are high in fiber, such as whole grains, beans, apples, broccoli, carrots, peas, and barley. General tips   Work with your doctor to lose weight if you need to.  Avoid: ? Foods with added sugar. ? Fried foods. ? Foods with partially hydrogenated oils.  Limit alcohol intake to no more than 1 drink a day for nonpregnant women and 2 drinks a day for men. One drink equals 12 oz of beer, 5 oz of wine, or 1 oz of hard liquor. Reading food labels  Check food labels for: ? Trans fats. ? Partially hydrogenated oils. ? Saturated fat (g) in each serving. ? Cholesterol (mg) in each serving. ? Fiber (g) in each serving.  Choose foods with healthy fats, such as: ? Monounsaturated fats. ? Polyunsaturated fats. ? Omega-3 fats.  Choose grain products that have whole grains. Look for the word "whole" as the first word in the ingredient list. Cooking  Cook foods using low-fat methods. These include baking, boiling, grilling, and  broiling.  Eat more home-cooked foods. Eat at restaurants and buffets less often.  Avoid cooking using saturated fats, such as butter, cream, palm oil, palm kernel oil, and coconut oil. Recommended foods  Fruits  All fresh, canned (in natural juice), or frozen fruits. Vegetables  Fresh or frozen vegetables (raw, steamed, roasted, or grilled). Green salads. Grains  Whole grains, such as whole wheat or whole grain breads, crackers, cereals, and pasta. Unsweetened oatmeal, bulgur, barley, quinoa, or brown rice. Corn or whole wheat flour tortillas. Meats and other protein foods  Ground beef (85% or leaner), grass-fed beef, or beef trimmed of fat. Skinless chicken or [place]. Ground chicken or [place]. Pork trimmed of fat. All fish and seafood. Egg whites. Dried beans, peas, or lentils. Unsalted nuts or seeds. Unsalted canned beans. Nut butters without added sugar or oil. Dairy  Low-fat or nonfat dairy products, such as skim or 1% milk, 2% or reduced-fat cheeses, low-fat and fat-free ricotta or cottage cheese, or plain low-fat and nonfat yogurt. Fats and oils  Tub margarine without trans fats. Light or reduced-fat mayonnaise and salad dressings. Avocado. Olive, canola, sesame, or safflower oils. The items listed above may not be a complete list of foods and beverages you can eat. Contact a dietitian for more information. Foods to avoid Fruits  Canned fruit in heavy syrup. Fruit in cream or butter sauce. Fried fruit. Vegetables  Vegetables cooked in cheese, cream, or butter sauce. Fried vegetables. Grains  White bread. White pasta. White rice. Cornbread. Bagels, pastries,  and croissants. Crackers and snack foods that contain trans fat and hydrogenated oils. Meats and other protein foods  Fatty cuts of meat. Ribs, chicken wings, bacon, sausage, bologna, salami, chitterlings, fatback, hot dogs, bratwurst, and packaged lunch meats. Liver and organ meats. Whole eggs and egg yolks. Chicken  and [place] with skin. Fried meat. Dairy  Whole or 2% milk, cream, half-and-half, and cream cheese. Whole milk cheeses. Whole-fat or sweetened yogurt. Full-fat cheeses. Nondairy creamers and whipped toppings. Processed cheese, cheese spreads, and cheese curds. Beverages  Alcohol. Sugar-sweetened drinks such as sodas, lemonade, and fruit drinks. Fats and oils  Butter, stick margarine, lard, shortening, ghee, or bacon fat. Coconut, palm kernel, and palm oils. Sweets and desserts  Corn syrup, sugars, honey, and molasses. Candy. Jam and jelly. Syrup. Sweetened cereals. Cookies, pies, cakes, donuts, muffins, and ice cream. The items listed above may not be a complete list of foods and beverages you should avoid. Contact a dietitian for more information. Summary  Choosing the right foods helps keep your fat and cholesterol at normal levels. This can keep you from getting certain diseases.  At meals, fill one-half of your plate with vegetables and green salads.  Eat high-fiber foods, like whole grains, beans, apples, carrots, peas, and barley.  Limit added sugar, saturated fats, alcohol, and fried foods. This information is not intended to replace advice given to you by your health care provider. Make sure you discuss any questions you have with your health care provider. Document Released: 04/27/2012 Document Revised: 06/30/2018 Document Reviewed: 07/14/2017 Elsevier Interactive Patient Education  2019 [employer].

## 2019-01-28 NOTE — Progress Notes (Signed)
BP 129/78   Pulse 80   Temp 98.3 F (36.8 C) (Oral)   Ht 5\' 2"  (1.575 m)   Wt 137 lb (62.1 kg)   LMP  (LMP Unknown)   SpO2 98%   BMI 25.06 kg/m    Subjective:    Patient ID: Denise Everett, female    DOB: August 22, 1951, 68 y.o.   MRN: [id number]  HPI: Denise Everett is a 68 y.o. female  Chief Complaint  Patient presents with  . Hypertension    6/u  . Leg Pain    bilateral  . Medication Refill    risperdal, lisinopril   HYPERTENSION Hypertension status: controlled  Satisfied with current treatment? yes Duration of hypertension: chronic BP monitoring frequency:  not checking BP range:  BP medication side effects:  no Medication compliance: good compliance Aspirin: no Recurrent headaches: no Visual changes: no Palpitations: no Dyspnea: no Chest pain: no Lower extremity edema: no Dizzy/lightheaded: no   LEG PAIN: Was started on Gabapentin six months ago.  Reports the pain is better, states "it hurts, but not that bad anymore".    SCHIZOPHRENIA: Reports this is well-controlled with her medication.  Prior to medication she had auditory hallucinations.  Diagnosed about 20 years ago.  Denies any behaviors or auditory hallucinations.    IDIOPATHIC THROMBOCYTOPENIC PURPURA: Chronic, ongoing.  Followed by Dr. Grayland Ormond, but has missed several follow-up visits.  States she has "started bruising again" and reports "I was told to see him if I bruise a lot".  Denies any hematuria, blood in stool, or bleeding gums.  Reports bruises noted to BUE, but does not recall any recent trauma.  On review PLT average mid-40's to 60's.  States her left arm is a "little swollen today", but reports this has been going on for years as she lies on this side when she sleeps or naps.  Was sleeping prior to coming to appointment.  Denies any pain or decrease in ROM.  Relevant past medical, surgical, family and social history reviewed and updated as indicated. Interim medical history since our last  visit reviewed. Allergies and medications reviewed and updated.  Review of Systems  Constitutional: Negative for activity change, appetite change, diaphoresis, fatigue and fever.  Respiratory: Negative for cough, chest tightness and shortness of breath.   Cardiovascular: Negative for chest pain, palpitations and leg swelling.  Gastrointestinal: Negative for abdominal distention, abdominal pain, constipation, diarrhea, nausea and vomiting.  Endocrine: Negative for cold intolerance, heat intolerance, polydipsia, polyphagia and polyuria.  Skin: Negative for wound.       Bruising to arms  Neurological: Negative for dizziness, syncope, weakness, light-headedness, numbness and headaches.  Psychiatric/Behavioral: Negative.     Per HPI unless specifically indicated above     Objective:    BP 129/78   Pulse 80   Temp 98.3 F (36.8 C) (Oral)   Ht 5\' 2"  (1.575 m)   Wt 137 lb (62.1 kg)   LMP  (LMP Unknown)   SpO2 98%   BMI 25.06 kg/m   Wt Readings from Last 3 Encounters:  01/28/19 137 lb (62.1 kg)  11/11/18 137 lb (62.1 kg)  07/30/18 139 lb 9.6 oz (63.3 kg)    Physical Exam Vitals signs and nursing note reviewed.  Constitutional:      General: She is awake.     Appearance: She is well-developed.  HENT:     Head: Normocephalic.  Eyes:     General:        Right  eye: No discharge.        Left eye: No discharge.     Conjunctiva/sclera: Conjunctivae normal.     Pupils: Pupils are equal, round, and reactive to light.  Neck:     Musculoskeletal: Normal range of motion and neck supple.     Thyroid: No thyromegaly.     Vascular: No carotid bruit or JVD.  Cardiovascular:     Rate and Rhythm: Normal rate and regular rhythm.     Heart sounds: Normal heart sounds. No murmur. No gallop.   Pulmonary:     Effort: Pulmonary effort is normal.     Breath sounds: Normal breath sounds.  Abdominal:     General: Bowel sounds are normal.     Palpations: Abdomen is soft.  Musculoskeletal:      Right lower leg: No edema.     Left lower leg: No edema.  Lymphadenopathy:     Cervical: No cervical adenopathy.  Skin:    General: Skin is warm and dry.     Comments: Two small, approx walnut size, pale purple bruises to left upper inner arm.  No tenderness.  Left arm with mild swelling to wrist and hand.  One small, approx almond size, pale purple bruise to right upper inner arm.  No abrasions or wounds noted.    Neurological:     Mental Status: She is alert and oriented to person, place, and time.  Psychiatric:        Behavior: Behavior normal. Behavior is cooperative.        Thought Content: Thought content normal.        Judgment: Judgment normal.     Results for orders placed or performed in visit on 03/02/18  TSH  Result Value Ref Range   TSH 0.538 0.450 - 4.500 uIU/mL  CBC with Differential/Platelet  Result Value Ref Range   WBC 4.3 3.4 - 10.8 x10E3/uL   RBC 3.77 3.77 - 5.28 x10E6/uL   Hemoglobin 11.0 (L) 11.1 - 15.9 g/dL   Hematocrit 33.1 (L) 34.0 - 46.6 %   MCV 88 79 - 97 fL   MCH 29.2 26.6 - 33.0 pg   MCHC 33.2 31.5 - 35.7 g/dL   RDW 14.6 12.3 - 15.4 %   Platelets 52 (LL) 150 - 379 x10E3/uL   Neutrophils 56 Not Estab. %   Lymphs 30 Not Estab. %   Monocytes 10 Not Estab. %   Eos 3 Not Estab. %   Basos 0 Not Estab. %   Neutrophils Absolute 2.4 1.4 - 7.0 x10E3/uL   Lymphocytes Absolute 1.3 0.7 - 3.1 x10E3/uL   Monocytes Absolute 0.4 0.1 - 0.9 x10E3/uL   EOS (ABSOLUTE) 0.1 0.0 - 0.4 x10E3/uL   Basophils Absolute 0.0 0.0 - 0.2 x10E3/uL   Immature Granulocytes 1 Not Estab. %   Immature Grans (Abs) 0.0 0.0 - 0.1 x10E3/uL   Hematology Comments: Note:   Comprehensive metabolic panel  Result Value Ref Range   Glucose 95 65 - 99 mg/dL   BUN 12 8 - 27 mg/dL   Creatinine, Ser 1.69 (H) 0.57 - 1.00 mg/dL   GFR calc non Af Amer 31 (L) >59 mL/min/1.73   GFR calc Af Amer 35 (L) >59 mL/min/1.73   BUN/Creatinine Ratio 7 (L) 12 - 28   Sodium 135 134 - 144 mmol/L    Potassium 4.1 3.5 - 5.2 mmol/L   Chloride 98 96 - 106 mmol/L   CO2 20 20 - 29 mmol/L   Calcium  9.1 8.7 - 10.3 mg/dL   Total Protein 6.8 6.0 - 8.5 g/dL   Albumin 4.0 3.6 - 4.8 g/dL   Globulin, Total 2.8 1.5 - 4.5 g/dL   Albumin/Globulin Ratio 1.4 1.2 - 2.2   Bilirubin Total <0.2 0.0 - 1.2 mg/dL   Alkaline Phosphatase 63 39 - 117 IU/L   AST 13 0 - 40 IU/L   ALT 6 0 - 32 IU/L  Lipid Panel w/o Chol/HDL Ratio  Result Value Ref Range   Cholesterol, Total 148 100 - 199 mg/dL   Triglycerides 60 0 - 149 mg/dL   HDL 42 >39 mg/dL   VLDL Cholesterol Cal 12 5 - 40 mg/dL   LDL Calculated 94 0 - 99 mg/dL      Assessment & Plan:   Problem List Items Addressed This Visit      Cardiovascular and Mediastinum   Hypertension    Chronic, ongoing.  Continue current medication regimen.  BP below goal.      Relevant Medications   atorvastatin (LIPITOR) 10 MG tablet   lisinopril (PRINIVIL,ZESTRIL) 10 MG tablet   Other Relevant Orders   CBC with Differential/Platelet   Comprehensive metabolic panel     Musculoskeletal and Integument   Idiopathic thrombocytopenic purpura (HCC) (Chronic)    Have scheduled appointment for April 29th with Dr. Grayland Ormond and will obtain CBC today, if PLT low to notify him and will schedule sooner.        Other   Chronic leg pain (Chronic)    Chronic, ongoing, well-controlled on current Gabapentin.  Continue current medication regimen.        Relevant Medications   gabapentin (NEURONTIN) 300 MG capsule   Schizophrenia ([place]) - Primary    Chronic, ongoing.  Well-controlled on current Risperdal dosing.  Continue medication regimen.      Hyperlipidemia    Chronic, ongoing.  Continue current medication regimen.  Lipid panel and CMP today.      Relevant Medications   atorvastatin (LIPITOR) 10 MG tablet   lisinopril (PRINIVIL,ZESTRIL) 10 MG tablet   Other Relevant Orders   Comprehensive metabolic panel   Lipid Panel w/o Chol/HDL Ratio    Other Visit Diagnoses     Colon cancer screening       Relevant Orders   Ambulatory referral to Gastroenterology   Breast cancer screening       Relevant Orders   MM DIGITAL SCREENING BILATERAL   Thyroid disorder screen       Relevant Orders   TSH       Follow up plan: No follow-ups on file.

## 2019-01-29 LAB — COMPREHENSIVE METABOLIC PANEL
ALT: 4 IU/L (ref 0–32)
AST: 13 IU/L (ref 0–40)
Albumin/Globulin Ratio: 1.8 (ref 1.2–2.2)
Albumin: 4.2 g/dL (ref 3.8–4.8)
Alkaline Phosphatase: 56 IU/L (ref 39–117)
BUN/Creatinine Ratio: 6 — ABNORMAL LOW (ref 12–28)
BUN: 12 mg/dL (ref 8–27)
Bilirubin Total: 0.3 mg/dL (ref 0.0–1.2)
CALCIUM: 9 mg/dL (ref 8.7–10.3)
CO2: 24 mmol/L (ref 20–29)
Chloride: 96 mmol/L (ref 96–106)
Creatinine, Ser: 1.89 mg/dL — ABNORMAL HIGH (ref 0.57–1.00)
GFR calc Af Amer: 31 mL/min/{1.73_m2} — ABNORMAL LOW (ref >59)
GFR calc non Af Amer: 27 mL/min/{1.73_m2} — ABNORMAL LOW (ref >59)
Globulin, Total: 2.4 g/dL (ref 1.5–4.5)
Glucose: 70 mg/dL (ref 65–99)
Potassium: 3.8 mmol/L (ref 3.5–5.2)
Sodium: 139 mmol/L (ref 134–144)
Total Protein: 6.6 g/dL (ref 6.0–8.5)

## 2019-01-29 LAB — CBC WITH DIFFERENTIAL/PLATELET
BASOS ABS: 0.1 10*3/uL (ref 0.0–0.2)
Basos: 1 %
EOS (ABSOLUTE): 0.1 10*3/uL (ref 0.0–0.4)
Eos: 2 %
Hematocrit: 33.6 % — ABNORMAL LOW (ref 34.0–46.6)
Hemoglobin: 11.3 g/dL (ref 11.1–15.9)
IMMATURE GRANS (ABS): 0 10*3/uL (ref 0.0–0.1)
IMMATURE GRANULOCYTES: 1 %
Lymphocytes Absolute: 2.1 10*3/uL (ref 0.7–3.1)
Lymphs: 42 %
MCH: 29 pg (ref 26.6–33.0)
MCHC: 33.6 g/dL (ref 31.5–35.7)
MCV: 86 fL (ref 79–97)
MONOCYTES: 10 %
Monocytes Absolute: 0.5 10*3/uL (ref 0.1–0.9)
NEUTROS PCT: 44 %
Neutrophils Absolute: 2.2 10*3/uL (ref 1.4–7.0)
PLATELETS: 62 10*3/uL — AB (ref 150–450)
RBC: 3.9 x10E6/uL (ref 3.77–5.28)
RDW: 14 % (ref 11.7–15.4)
WBC: 5 10*3/uL (ref 3.4–10.8)

## 2019-01-29 LAB — LIPID PANEL W/O CHOL/HDL RATIO
Cholesterol, Total: 170 mg/dL (ref 100–199)
HDL: 53 mg/dL (ref >39)
LDL Calculated: 102 mg/dL — ABNORMAL HIGH (ref 0–99)
Triglycerides: 77 mg/dL (ref 0–149)
VLDL Cholesterol Cal: 15 mg/dL (ref 5–40)

## 2019-01-29 LAB — TSH: TSH: 0.517 u[IU]/mL (ref 0.450–4.500)

## 2019-01-31 NOTE — Progress Notes (Signed)
Thank you :)

## 2019-03-03 ENCOUNTER — Telehealth: Payer: Self-pay | Admitting: Gastroenterology

## 2019-03-03 NOTE — Telephone Encounter (Signed)
Gastroenterology Pre-Procedure Review  Request Date: Denise Everett 06/29/2019    [facility] Requesting Physician: Dr. Bonna Gains  PATIENT REVIEW QUESTIONS: The patient responded to the following health history questions as indicated:    1. Are you having any GI issues? no 2. Do you have a personal history of Polyps? no 3. Do you have a family history of Colon Cancer or Polyps? yes (Brother - polyps) 4. Diabetes Mellitus? no 5. Joint replacements in the past 12 months?no 6. Major health problems in the past 3 months?no 7. Any artificial heart valves, MVP, or defibrillator?no    MEDICATIONS & ALLERGIES:    Patient reports the following regarding taking any anticoagulation/antiplatelet therapy:   Plavix, Coumadin, Eliquis, Xarelto, Lovenox, Pradaxa, Brilinta, or Effient? no Aspirin? no  Patient confirms/reports the following medications:  Current Outpatient Medications  Medication Sig Dispense Refill  . Aspirin-Acetaminophen-Caffeine (GOODYS EXTRA STRENGTH) [phone] MG PACK Take by mouth.    Marland Kitchen atorvastatin (LIPITOR) 10 MG tablet Take 1 tablet (10 mg total) by mouth daily at 6 PM. 90 tablet 1  . gabapentin (NEURONTIN) 300 MG capsule Take 1 capsule (300 mg total) by mouth 3 (three) times daily. Start with 1 daily for 3 days and gradually increase to three times/day 90 capsule 3  . lisinopril (PRINIVIL,ZESTRIL) 10 MG tablet Take 0.5 tablets (5 mg total) by mouth daily. 30 tablet 3  . raloxifene (EVISTA) 60 MG tablet Take 1 tablet (60 mg total) by mouth daily. 90 tablet 3  . risperiDONE (RISPERDAL) 2 MG tablet Take 0.5-1 tablets (1-2 mg total) by mouth at bedtime. 30 tablet 5   No current facility-administered medications for this visit.     Patient confirms/reports the following allergies:  Allergies  Allergen Reactions  . No Known Allergies     No orders of the defined types were placed in this encounter.   AUTHORIZATION INFORMATION Primary Insurance: 1D#: Group #:  Secondary  Insurance: 1D#: Group #:  SCHEDULE INFORMATION: Date:  Time: Location:

## 2019-03-04 ENCOUNTER — Other Ambulatory Visit: Payer: Self-pay

## 2019-03-04 DIAGNOSIS — Z1211 Encounter for screening for malignant neoplasm of colon: Secondary | ICD-10-CM

## 2019-03-04 DIAGNOSIS — Z1212 Encounter for screening for malignant neoplasm of rectum: Principal | ICD-10-CM

## 2019-03-05 IMAGING — CR DG LUMBAR SPINE COMPLETE W/ BEND
7 series · 7 of 7 positions shown · non-contrast
Comparison: Lumbar spine radiographs April 26, 2014 and lumbar MRI
June 22, 2014

CLINICAL DATA: Chronic lumbago and lower extremity radicular
symptoms

EXAM:
LUMBAR SPINE - COMPLETE WITH BENDING VIEWS

[l-spine ap]
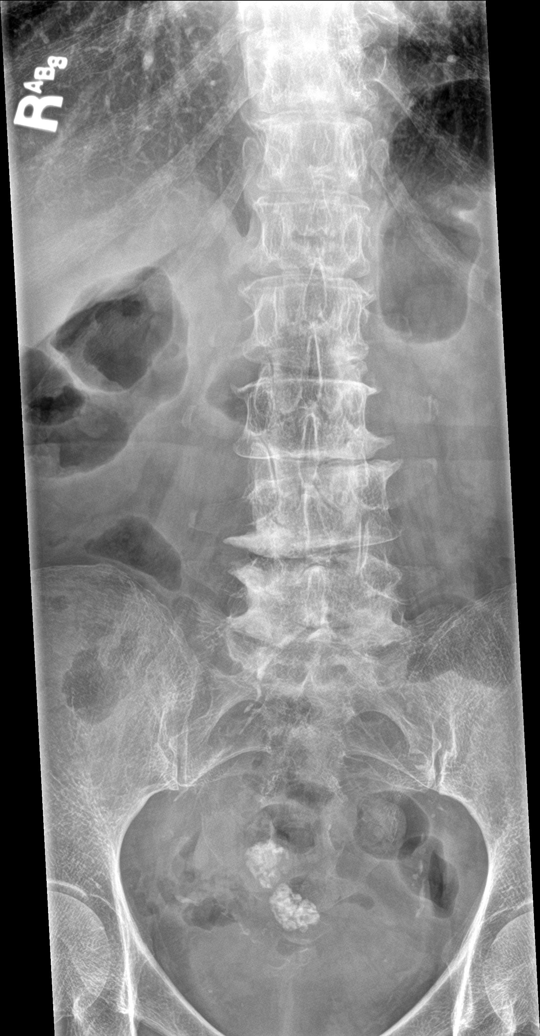

[l-spine obl (1 of 2)]
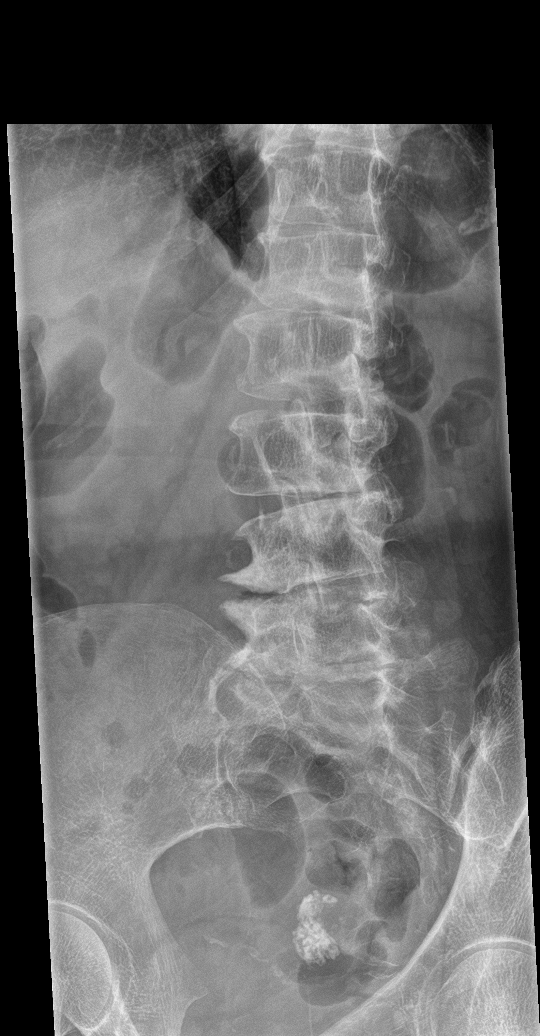

[l-spine obl (2 of 2)]
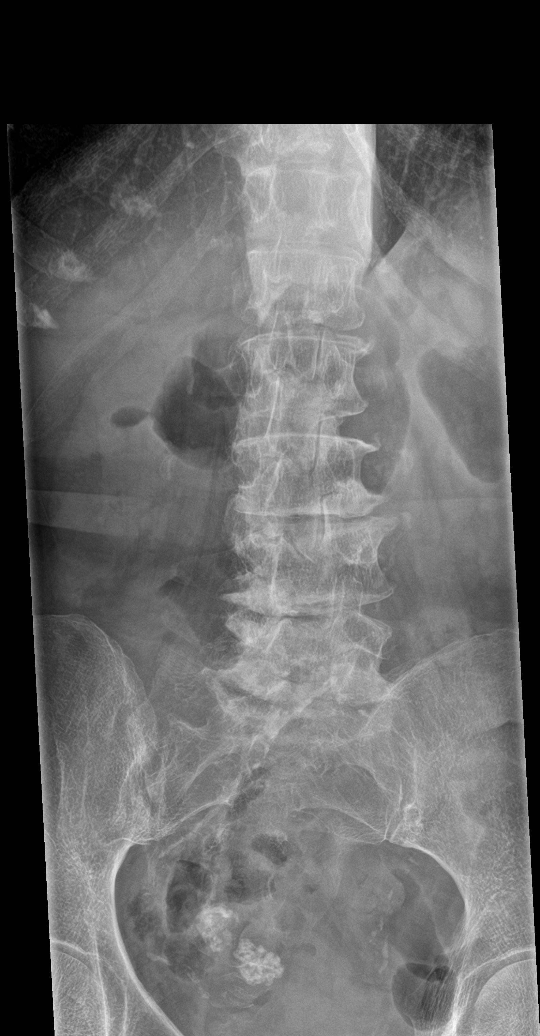

[l-spine lat]
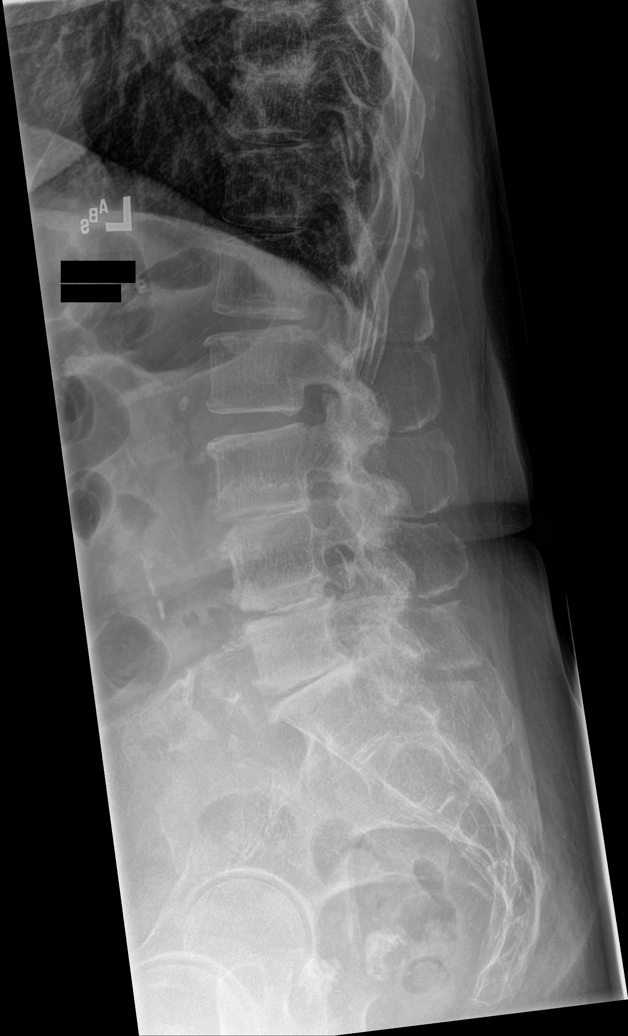

[l-spine flex]
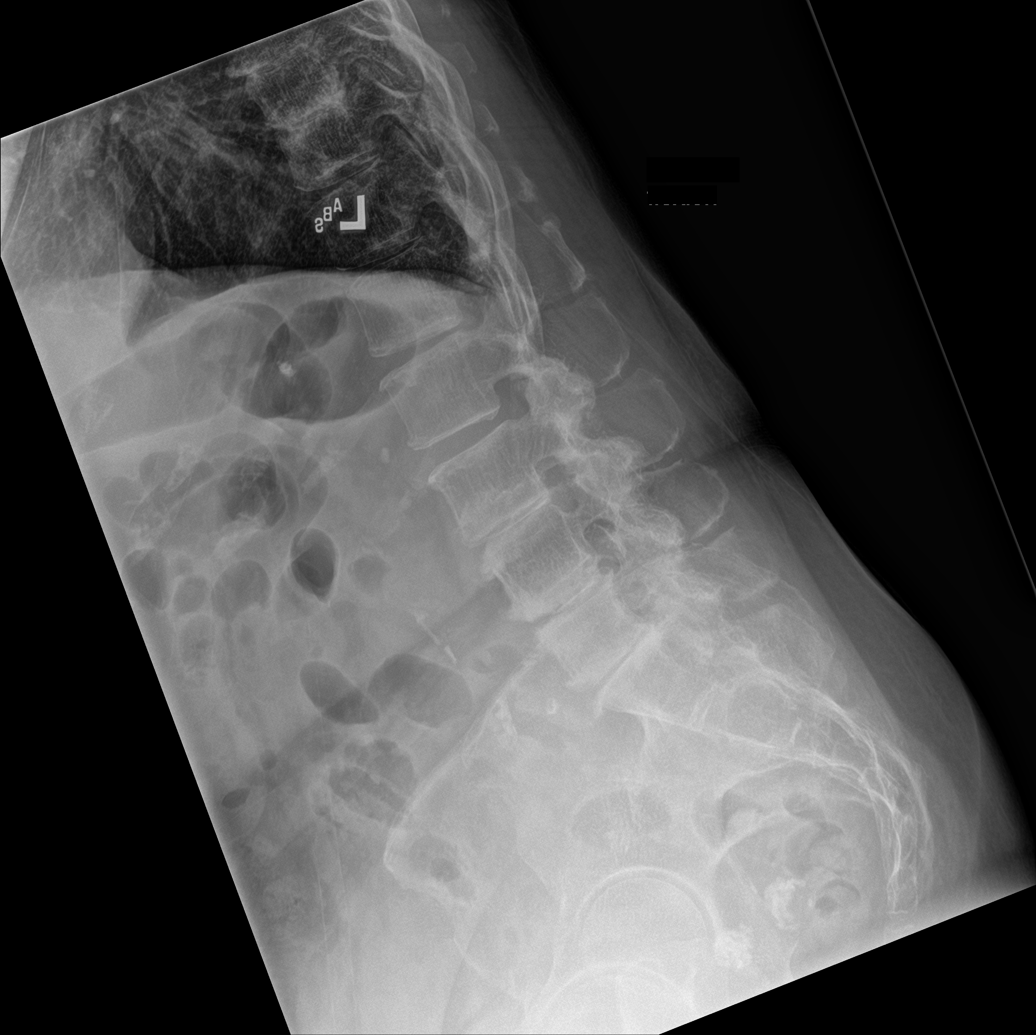

[l-spine ext]
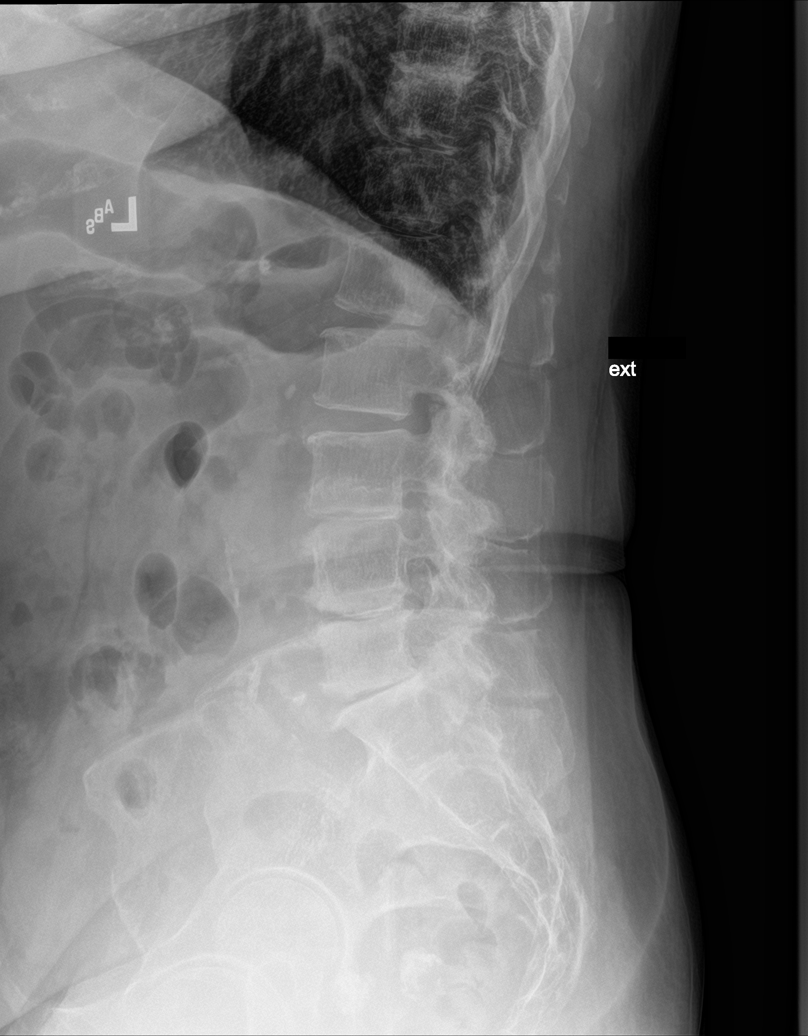

[l-spine spot]
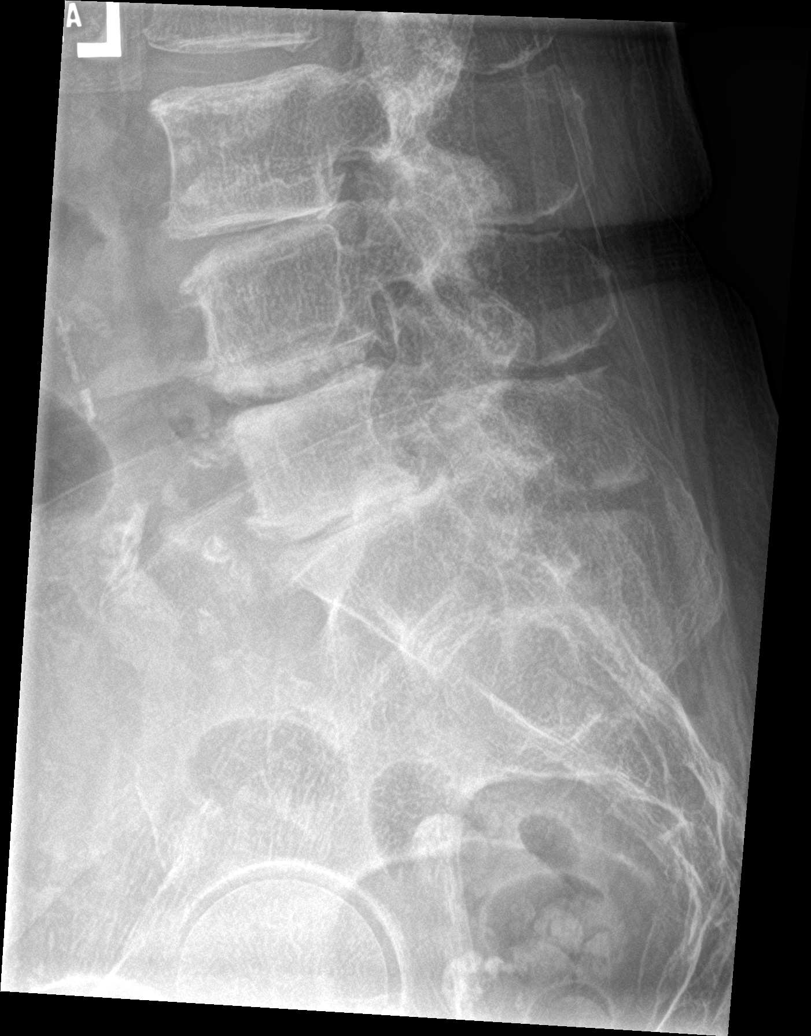

[7 of 7 positions shown; findings below may reference images not displayed]

FINDINGS: Frontal, standing neutral lateral, standing flexion lateral,
standing extension lateral, spot lumbosacral lateral, and bilateral
oblique -total 7-views obtained. There are 5 non-rib-bearing lumbar
type vertebral bodies. There is no fracture. There is no appreciable
spondylolisthesis. There is no change in lateral alignment with
flexion-extension. There is moderately severe disc space narrowing
at L3-4, L4-5, L5-S1, slightly progressed from prior study. There is
facet osteoarthritic change at all levels bilaterally, most notably
at L5-S1 bilaterally. Calcification in the pelvis is likely due to
uterine leiomyomas. There is aortoiliac atherosclerosis.
IMPRESSION: Multilevel osteoarthritic change with overall progression since
prior study. No fracture or spondylolisthesis. No change in lateral
alignment with flexion-extension. Calcifications in the pelvis
likely represent uterine leiomyomas. There is aortoiliac
atherosclerosis.

## 2019-03-09 ENCOUNTER — Ambulatory Visit: Payer: Medicare Other | Admitting: Oncology

## 2019-05-31 ENCOUNTER — Ambulatory Visit: Payer: Medicare Other | Admitting: Nurse Practitioner

## 2019-06-06 ENCOUNTER — Encounter: Payer: Self-pay | Admitting: Nurse Practitioner

## 2019-06-06 ENCOUNTER — Other Ambulatory Visit: Payer: Self-pay

## 2019-06-06 ENCOUNTER — Ambulatory Visit (INDEPENDENT_AMBULATORY_CARE_PROVIDER_SITE_OTHER): Payer: Medicare Other | Admitting: Nurse Practitioner

## 2019-06-06 VITALS — BP 136/84 | HR 55 | Temp 98.6°F

## 2019-06-06 DIAGNOSIS — D693 Immune thrombocytopenic purpura: Secondary | ICD-10-CM | POA: Diagnosis not present

## 2019-06-06 DIAGNOSIS — N183 Chronic kidney disease, stage 3 (moderate): Secondary | ICD-10-CM

## 2019-06-06 DIAGNOSIS — E7800 Pure hypercholesterolemia, unspecified: Secondary | ICD-10-CM

## 2019-06-06 DIAGNOSIS — M25512 Pain in left shoulder: Secondary | ICD-10-CM

## 2019-06-06 DIAGNOSIS — I129 Hypertensive chronic kidney disease with stage 1 through stage 4 chronic kidney disease, or unspecified chronic kidney disease: Secondary | ICD-10-CM

## 2019-06-06 DIAGNOSIS — I10 Essential (primary) hypertension: Secondary | ICD-10-CM

## 2019-06-06 DIAGNOSIS — F209 Schizophrenia, unspecified: Secondary | ICD-10-CM

## 2019-06-06 MED ORDER — PREDNISONE 10 MG PO TABS: 30.0000 mg | ORAL_TABLET | Freq: Every day | ORAL | 0 refills | Status: AC

## 2019-06-06 NOTE — Assessment & Plan Note (Addendum)
Chronic, ongoing with initial BP elevated, but repeat at goal.  Continue current medication regimen and adjust as needed.  CMP today.

## 2019-06-06 NOTE — Progress Notes (Signed)
BP 136/84 (BP Location: Left Arm, Patient Position: Sitting)   Pulse (!) 55   Temp 98.6 F (37 C) (Oral)   LMP  (LMP Unknown)   SpO2 100%    Subjective:    Patient ID: Denise Everett, female    DOB: 05-19-1951, 68 y.o.   MRN: [id number]  HPI: Denise Everett is a 68 y.o. female  Chief Complaint  Patient presents with  . Hyperlipidemia  . Hypertension   HYPERTENSION / HYPERLIPIDEMIA Taking Atorvastatin 10 MG daily and Lisinopril 5 MG daily.  Nephrology cancelled her recent appointment and she is working on rescheduling. Satisfied with current treatment? yes Duration of hypertension: chronic BP monitoring frequency: not checking BP range:  BP medication side effects: no Duration of hyperlipidemia: chronic Cholesterol medication side effects: yes Cholesterol supplements: none Medication compliance: good compliance Aspirin: no Recent stressors: no Recurrent headaches: no Visual changes: no Palpitations: no Dyspnea: no Chest pain: no Lower extremity edema: no Dizzy/lightheaded: no   SCHIZOPHRENIA: Reports this is well-controlled with her medication.  Prior to medication she had auditory hallucinations.  Diagnosed about 20 years ago.  Denies any behaviors or auditory hallucinations.    IDIOPATHIC THROMBOCYTOPENIC PURPURA: Chronic, ongoing.  Followed by Dr. Grayland Ormond, but has missed several follow-up visits.  Sees him on August 25th, 2020.  On review PLT average mid-40's to 60's. Denies any pain, increased bruising, or decrease in ROM.  SHOULDER PAIN Has this issue every year and in past had injection to shoulder. Duration: days Involved shoulder: left Mechanism of injury: unknown Location: posterior Onset:gradual Severity: 6/10  Quality:  dull and aching Frequency: intermittent Radiation: no Aggravating factors: lifting and sleep  Alleviating factors: Gabapentin  Status: fluctuating Treatments attempted: Gabapentin Relief with NSAIDs?:  No NSAIDs Taken  Weakness: no Numbness: no Decreased grip strength: no Redness: no Swelling: no Bruising: no Fevers: no  Relevant past medical, surgical, family and social history reviewed and updated as indicated. Interim medical history since our last visit reviewed. Allergies and medications reviewed and updated.  Review of Systems  Constitutional: Negative for activity change, appetite change, diaphoresis, fatigue and fever.  Respiratory: Negative for cough, chest tightness and shortness of breath.   Cardiovascular: Negative for chest pain, palpitations and leg swelling.  Gastrointestinal: Negative for abdominal distention, abdominal pain, constipation, diarrhea, nausea and vomiting.  Musculoskeletal: Positive for arthralgias.  Neurological: Negative for dizziness, syncope, weakness, light-headedness, numbness and headaches.  Psychiatric/Behavioral: Negative.     Per HPI unless specifically indicated above     Objective:    BP 136/84 (BP Location: Left Arm, Patient Position: Sitting)   Pulse (!) 55   Temp 98.6 F (37 C) (Oral)   LMP  (LMP Unknown)   SpO2 100%   Wt Readings from Last 3 Encounters:  01/28/19 137 lb (62.1 kg)  11/11/18 137 lb (62.1 kg)  07/30/18 139 lb 9.6 oz (63.3 kg)    Physical Exam Vitals signs and nursing note reviewed.  Constitutional:      General: She is awake. She is not in acute distress.    Appearance: She is well-developed and overweight. She is not ill-appearing.  HENT:     Head: Normocephalic.     Right Ear: Hearing normal.     Left Ear: Hearing normal.     Nose: Nose normal.     Mouth/Throat:     Mouth: Mucous membranes are moist.  Eyes:     General: Lids are normal.  Right eye: No discharge.        Left eye: No discharge.     Conjunctiva/sclera: Conjunctivae normal.     Pupils: Pupils are equal, round, and reactive to light.  Neck:     Musculoskeletal: Normal range of motion and neck supple.     Thyroid: No thyromegaly.     Vascular:  No carotid bruit.  Cardiovascular:     Rate and Rhythm: Normal rate and regular rhythm.     Heart sounds: Normal heart sounds. No murmur. No gallop.   Pulmonary:     Effort: Pulmonary effort is normal. No accessory muscle usage or respiratory distress.     Breath sounds: Normal breath sounds.  Abdominal:     General: Bowel sounds are normal.     Palpations: Abdomen is soft. There is no hepatomegaly or splenomegaly.  Musculoskeletal:     Right shoulder: She exhibits normal range of motion, no tenderness, no swelling, no crepitus, no deformity, no pain and normal strength.     Left shoulder: She exhibits normal range of motion, no tenderness, no swelling, no crepitus, no deformity, no laceration, no pain and normal strength.     Right lower leg: No edema.     Left lower leg: No edema.     Comments: Bilateral shoulders with full ROM.  Left shoulder noted mild discomfort with extension, no discomfort with any other ROM movements and no decreased strength.  Negative empty can, Neers, [place].  Lymphadenopathy:     Cervical: No cervical adenopathy.  Skin:    General: Skin is warm and dry.  Neurological:     Mental Status: She is alert and oriented to person, place, and time.     Deep Tendon Reflexes:     Reflex Scores:      Brachioradialis reflexes are 2+ on the right side and 2+ on the left side.      Patellar reflexes are 2+ on the right side and 2+ on the left side. Psychiatric:        Attention and Perception: Attention normal.        Mood and Affect: Mood normal.        Behavior: Behavior normal. Behavior is cooperative.        Thought Content: Thought content normal.        Judgment: Judgment normal.     Results for orders placed or performed in visit on 01/28/19  CBC with Differential/Platelet  Result Value Ref Range   WBC 5.0 3.4 - 10.8 x10E3/uL   RBC 3.90 3.77 - 5.28 x10E6/uL   Hemoglobin 11.3 11.1 - 15.9 g/dL   Hematocrit 33.6 (L) 34.0 - 46.6 %   MCV 86 79 - 97 fL   MCH  29.0 26.6 - 33.0 pg   MCHC 33.6 31.5 - 35.7 g/dL   RDW 14.0 11.7 - 15.4 %   Platelets 62 (LL) 150 - 450 x10E3/uL   Neutrophils 44 Not Estab. %   Lymphs 42 Not Estab. %   Monocytes 10 Not Estab. %   Eos 2 Not Estab. %   Basos 1 Not Estab. %   Neutrophils Absolute 2.2 1.4 - 7.0 x10E3/uL   Lymphocytes Absolute 2.1 0.7 - 3.1 x10E3/uL   Monocytes Absolute 0.5 0.1 - 0.9 x10E3/uL   EOS (ABSOLUTE) 0.1 0.0 - 0.4 x10E3/uL   Basophils Absolute 0.1 0.0 - 0.2 x10E3/uL   Immature Granulocytes 1 Not Estab. %   Immature Grans (Abs) 0.0 0.0 - 0.1 x10E3/uL  Hematology Comments: Note:   Comprehensive metabolic panel  Result Value Ref Range   Glucose 70 65 - 99 mg/dL   BUN 12 8 - 27 mg/dL   Creatinine, Ser 1.89 (H) 0.57 - 1.00 mg/dL   GFR calc non Af Amer 27 (L) >59 mL/min/1.73   GFR calc Af Amer 31 (L) >59 mL/min/1.73   BUN/Creatinine Ratio 6 (L) 12 - 28   Sodium 139 134 - 144 mmol/L   Potassium 3.8 3.5 - 5.2 mmol/L   Chloride 96 96 - 106 mmol/L   CO2 24 20 - 29 mmol/L   Calcium 9.0 8.7 - 10.3 mg/dL   Total Protein 6.6 6.0 - 8.5 g/dL   Albumin 4.2 3.8 - 4.8 g/dL   Globulin, Total 2.4 1.5 - 4.5 g/dL   Albumin/Globulin Ratio 1.8 1.2 - 2.2   Bilirubin Total 0.3 0.0 - 1.2 mg/dL   Alkaline Phosphatase 56 39 - 117 IU/L   AST 13 0 - 40 IU/L   ALT 4 0 - 32 IU/L  Lipid Panel w/o Chol/HDL Ratio  Result Value Ref Range   Cholesterol, Total 170 100 - 199 mg/dL   Triglycerides 77 0 - 149 mg/dL   HDL 53 >39 mg/dL   VLDL Cholesterol Cal 15 5 - 40 mg/dL   LDL Calculated 102 (H) 0 - 99 mg/dL  TSH  Result Value Ref Range   TSH 0.517 0.450 - 4.500 uIU/mL      Assessment & Plan:   Problem List Items Addressed This Visit      Cardiovascular and Mediastinum   Hypertension    Chronic, ongoing with initial BP elevated, but repeat at goal.  Continue current medication regimen and adjust as needed.  CMP today.          Musculoskeletal and Integument   Idiopathic thrombocytopenic purpura (HCC)  (Chronic)    Continue to collaborate with hematology, next visit in one month.        Genitourinary   Hypertensive CKD (chronic kidney disease)    Chronic, ongoing.  Continue current medication regimen and collaboration with nephrology.  CMP today.        Other   Schizophrenia ([place]) - Primary    Chronic, stable.  Continue current medication regimen, Risperdal, and adjust as needed.        Hyperlipidemia    Chronic, ongoing.  Continue current medication regimen and adjust as needed.  Lipid panel today.        Relevant Orders   Lipid Panel w/o Chol/HDL Ratio   Comprehensive metabolic panel    Other Visit Diagnoses    Acute pain of left shoulder       Acute, script for 5 days 30 MG Prednisone.  Recommend alternate heat and ice as needed.  Return for worsening or continued symptoms.       Follow up plan: Return in about 6 months (around 12/07/2019) for HTN/HLD, Schizophrenia.

## 2019-06-06 NOTE — Patient Instructions (Signed)
Voltaren  Diclofenac skin gel What is this medicine? DICLOFENAC (dye KLOE fen ak) is a non-steroidal anti-inflammatory drug (NSAID). The 1% skin gel is used to treat osteoarthritis of the hands or knees. The 3% skin gel is used to treat actinic keratosis. This medicine may be used for other purposes; ask your health care provider or pharmacist if you have questions. COMMON BRAND NAME(S): DSG Sherrye Payor, Solaravix, Solaraze, Voltaren Gel What should I tell my health care provider before I take this medicine? They need to know if you have any of these conditions:  asthma  bleeding problems  coronary artery bypass graft (CABG) surgery within the past 2 weeks  heart disease  high blood pressure  if you frequently drink alcohol containing drinks  kidney disease  liver disease  open or infected skin  stomach problems  an unusual or allergic reaction to diclofenac, aspirin, other NSAIDs, other medicines, benzyl alcohol (3% gel only), foods, dyes, or preservatives  pregnant or trying to get pregnant  breast-feeding How should I use this medicine? This medicine is for external use only. Follow the directions on the prescription label. Wash hands before and after use. Do not get this medicine in your eyes. If you do, rinse out with plenty of cool tap water. Use your doses at regular intervals. Do not use your medicine more often than directed. A special MedGuide will be given to you by the pharmacist with each prescription and refill of the 1% gel. Be sure to read this information carefully each time. Talk to your pediatrician regarding the use of this medicine in children. Special care may be needed. The 3% gel is not approved for use in children. Overdosage: If you think you have taken too much of this medicine contact a poison control center or emergency room at once. NOTE: This medicine is only for you. Do not share this medicine with others. What if I miss a dose? If you miss a  dose, use it as soon as you can. If it is almost time for your next dose, use only that dose. Do not use double or extra doses. What may interact with this medicine?  aspirin  NSAIDs, medicines for pain and inflammation, like ibuprofen or naproxen Do not use any other skin products without telling your doctor or health care professional. This list may not describe all possible interactions. Give your health care provider a list of all the medicines, herbs, non-prescription drugs, or dietary supplements you use. Also tell them if you smoke, drink alcohol, or use illegal drugs. Some items may interact with your medicine. What should I watch for while using this medicine? Tell your doctor or healthcare provider if your symptoms do not start to get better or if they get worse. You will need to follow up with your healthcare provider to monitor your progress. You may need to be treated for up to 3 months if you are using the 3% gel, but the full effect may not occur until 1 month after stopping treatment. If you develop a severe skin reaction, contact your doctor or healthcare provider immediately. This medicine may cause serious skin reactions. They can happen weeks to months after starting the medicine. Contact your healthcare provider right away if you notice fevers or flu-like symptoms with a rash. The rash may be red or purple and then turn into blisters or peeling of the skin. Or, you might notice a red rash with swelling of the face, lips or lymph nodes in  your neck or under your arms. This medicine can make you more sensitive to the sun. Keep out of the sun. If you cannot avoid being in the sun, wear protective clothing and use sunscreen. Do not use sun lamps or tanning beds/booths. Do not take medicines such as ibuprofen and naproxen with this medicine. Side effects such as stomach upset, nausea, or ulcers may be more likely to occur. Many medicines available without a prescription should not be  taken with this medicine. This medicine does not prevent heart attack or stroke. In fact, this medicine may increase the chance of a heart attack or stroke. The chance may increase with longer use of this medicine and in people who have heart disease. If you take aspirin to prevent heart attack or stroke, talk with your doctor or healthcare provider. This medicine can cause ulcers and bleeding in the stomach and intestines at any time during treatment. Do not smoke cigarettes or drink alcohol. These increase irritation to your stomach and can make it more susceptible to damage from this medicine. Ulcers and bleeding can happen without warning symptoms and can cause death. You may get drowsy or dizzy. Do not drive, use machinery, or do anything that needs mental alertness until you know how this medicine affects you. Do not stand or sit up quickly, especially if you are an older patient. This reduces the risk of dizzy or fainting spells. This medicine can cause you to bleed more easily. Try to avoid damage to your teeth and gums when you brush or floss your teeth. What side effects may I notice from receiving this medicine? Side effects that you should report to your doctor or health care professional as soon as possible:  allergic reactions like skin rash, itching or hives, swelling of the face, lips, or tongue  black or bloody stools, blood in the urine or vomit  blurred vision  chest pain  difficulty breathing or wheezing  nausea or vomiting  rash, fever, and swollen lymph nodes  redness, blistering, peeling or loosening of the skin, including inside the mouth  slurred speech or weakness on one side of the body  trouble passing urine or change in the amount of urine  unexplained weight gain or swelling  unusually weak or tired  yellowing of eyes or skin Side effects that usually do not require medical attention (report to your doctor or health care professional if they continue or  are bothersome):  dizziness  dry skin  headache  heartburn  increased sensitivity to the sun  stomach pain  tingling at the application site This list may not describe all possible side effects. Call your doctor for medical advice about side effects. You may report side effects to FDA at 1-800-FDA-1088. Where should I keep my medicine? Keep out of the reach of children. Store the 1% gel at room temperature between 15 and 30 degrees C (59 and 86 degrees F). Store the 3% gel at room temperature between 20 and 25 degrees C (68 and 77 degrees F). Protect from light. Throw away any unused medicine after the expiration date. NOTE: This sheet is a summary. It may not cover all possible information. If you have questions about this medicine, talk to your doctor, pharmacist, or health care provider.  2020 Elsevier/Gold Standard (2019-01-12 13:05:18)

## 2019-06-06 NOTE — Assessment & Plan Note (Signed)
Chronic, stable.  Continue current medication regimen, Risperdal, and adjust as needed.

## 2019-06-06 NOTE — Assessment & Plan Note (Signed)
Continue to collaborate with hematology, next visit in one month.

## 2019-06-06 NOTE — Assessment & Plan Note (Signed)
Chronic, ongoing.  Continue current medication regimen and collaboration with nephrology.  CMP today.

## 2019-06-06 NOTE — Assessment & Plan Note (Signed)
Chronic, ongoing.  Continue current medication regimen and adjust as needed. Lipid panel today. 

## 2019-06-07 ENCOUNTER — Encounter: Payer: Self-pay | Admitting: Nurse Practitioner

## 2019-06-07 LAB — COMPREHENSIVE METABOLIC PANEL
ALT: 5 IU/L (ref 0–32)
AST: 11 IU/L (ref 0–40)
Albumin/Globulin Ratio: 1.8 (ref 1.2–2.2)
Albumin: 4 g/dL (ref 3.8–4.8)
Alkaline Phosphatase: 57 IU/L (ref 39–117)
BUN/Creatinine Ratio: 5 — ABNORMAL LOW (ref 12–28)
BUN: 7 mg/dL — ABNORMAL LOW (ref 8–27)
Bilirubin Total: 0.3 mg/dL (ref 0.0–1.2)
CO2: 22 mmol/L (ref 20–29)
Calcium: 8.5 mg/dL — ABNORMAL LOW (ref 8.7–10.3)
Chloride: 99 mmol/L (ref 96–106)
Creatinine, Ser: 1.55 mg/dL — ABNORMAL HIGH (ref 0.57–1.00)
GFR calc Af Amer: 39 mL/min/{1.73_m2} — ABNORMAL LOW (ref >59)
GFR calc non Af Amer: 34 mL/min/{1.73_m2} — ABNORMAL LOW (ref >59)
Globulin, Total: 2.2 g/dL (ref 1.5–4.5)
Glucose: 72 mg/dL (ref 65–99)
Potassium: 3.3 mmol/L — ABNORMAL LOW (ref 3.5–5.2)
Sodium: 139 mmol/L (ref 134–144)
Total Protein: 6.2 g/dL (ref 6.0–8.5)

## 2019-06-07 LAB — LIPID PANEL W/O CHOL/HDL RATIO
Cholesterol, Total: 150 mg/dL (ref 100–199)
HDL: 50 mg/dL (ref >39)
LDL Calculated: 85 mg/dL (ref 0–99)
Triglycerides: 77 mg/dL (ref 0–149)
VLDL Cholesterol Cal: 15 mg/dL (ref 5–40)

## 2019-06-07 NOTE — Progress Notes (Signed)
Lab result letter

## 2019-06-24 ENCOUNTER — Other Ambulatory Visit: Admission: RE | Admit: 2019-06-24 | Payer: Medicare Other | Source: Ambulatory Visit

## 2019-06-29 ENCOUNTER — Encounter: Admission: RE | Payer: Self-pay | Source: Home / Self Care

## 2019-06-29 ENCOUNTER — Ambulatory Visit: Admission: RE | Admit: 2019-06-29 | Payer: Medicare Other | Source: Home / Self Care | Admitting: Gastroenterology

## 2019-06-29 SURGERY — COLONOSCOPY WITH PROPOFOL
Anesthesia: General

## 2019-07-01 NOTE — Progress Notes (Deleted)
[place]  [phone] [phone]) [phone]  ID: Denise Everett OB: Sep 15, 1951  MR#: [id number]  GUY#:[id number]  Patient Care Team: Venita Lick, NP as PCP - General (Nurse Practitioner) Lloyd Huger, MD as Consulting Physician (Oncology)  CHIEF COMPLAINT: MGUS and ITP  INTERVAL HISTORY: Patient returns to clinic today for routine 6 month evaluation and laboratory work. She currently feels well and is asymptomatic.  She does not complain of any further weight loss.  She denies fevers or night sweats. She does not complain of easy bleeding or bruising today.  She has no neurologic complaints. She denies any chest pain or shortness of breath. She denies any nausea, vomiting, constipation, or diarrhea.  She has no melena or hematochezia.  She has no urinary complaints.  Patient offers no further specific complaints today.  REVIEW OF SYSTEMS:   Review of Systems  Constitutional: Negative.  Negative for fever, malaise/fatigue and weight loss.  Respiratory: Negative.  Negative for cough and shortness of breath.   Cardiovascular: Negative.  Negative for chest pain and leg swelling.  Gastrointestinal: Negative.  Negative for abdominal pain, blood in stool and melena.  Genitourinary: Negative.   Musculoskeletal: Negative.   Skin: Negative.  Negative for rash.  Neurological: Negative.  Negative for weakness.  Endo/Heme/Allergies: Does not bruise/bleed easily.  Psychiatric/Behavioral: Negative.  The patient is not nervous/anxious.     As per HPI. Otherwise, a complete review of systems is negative.  PAST MEDICAL HISTORY: Past Medical History:  Diagnosis Date  . Abnormal weight loss 06/07/2015  . Allergy   . Cataract   . Chronic kidney disease   . Chronic pain syndrome   . Hyperlipidemia   . Hypertension   . Leg pain   . Osteoporosis    hips  . Renal insufficiency   . Schizophrenia ([place])   . Thrombocytopenia ([place])   . Tobacco use disorder      PAST SURGICAL HISTORY: Past Surgical History:  Procedure Laterality Date  . ankle surgery Right    x's 2  . CATARACT EXTRACTION      FAMILY HISTORY Family History  Problem Relation Age of Onset  . Hyperlipidemia Mother   . Hypertension Mother        ADVANCED DIRECTIVES:    HEALTH MAINTENANCE: Social History   Tobacco Use  . Smoking status: Former Smoker    Packs/day: 1.00    Years: 43.00    Pack years: 43.00    Types: Cigarettes    Quit date: 07/21/2017    Years since quitting: 1.9  . Smokeless tobacco: Former Systems developer    Types: Snuff  Substance Use Topics  . Alcohol use: No  . Drug use: No     Colonoscopy:  PAP:  Bone density:  Lipid panel:  Allergies  Allergen Reactions  . No Known Allergies     Current Outpatient Medications  Medication Sig Dispense Refill  . Aspirin-Acetaminophen-Caffeine (GOODYS EXTRA STRENGTH) [phone] MG PACK Take by mouth.    Marland Kitchen atorvastatin (LIPITOR) 10 MG tablet Take 1 tablet (10 mg total) by mouth daily at 6 PM. 90 tablet 1  . gabapentin (NEURONTIN) 300 MG capsule Take 1 capsule (300 mg total) by mouth 3 (three) times daily. Start with 1 daily for 3 days and gradually increase to three times/day 90 capsule 3  . lisinopril (PRINIVIL,ZESTRIL) 10 MG tablet Take 0.5 tablets (5 mg total) by mouth daily. 30 tablet 3  . raloxifene (EVISTA) 60 MG tablet Take 1 tablet (  60 mg total) by mouth daily. 90 tablet 3  . risperiDONE (RISPERDAL) 2 MG tablet Take 0.5-1 tablets (1-2 mg total) by mouth at bedtime. 30 tablet 5   No current facility-administered medications for this visit.     OBJECTIVE: There were no vitals filed for this visit.   There is no height or weight on file to calculate BMI.    ECOG FS:0 - Asymptomatic  General: Well-developed, well-nourished, no acute distress. Eyes: Pink conjunctiva, anicteric sclera. Lungs: Clear to auscultation bilaterally. Heart: Regular rate and rhythm. No rubs, murmurs, or gallops. Abdomen:  Soft, nontender, nondistended. No organomegaly noted, normoactive bowel sounds. Musculoskeletal: No edema, cyanosis, or clubbing. Neuro: Alert, answering all questions appropriately. Cranial nerves grossly intact. Skin: No rashes or petechiae noted. Occasional ecchymoses noted. Psych: Normal affect.   LAB RESULTS:  Lab Results  Component Value Date   NA 139 06/06/2019   K 3.3 (L) 06/06/2019   CL 99 06/06/2019   CO2 22 06/06/2019   GLUCOSE 72 06/06/2019   BUN 7 (L) 06/06/2019   CREATININE 1.55 (H) 06/06/2019   CALCIUM 8.5 (L) 06/06/2019   PROT 6.2 06/06/2019   ALBUMIN 4.0 06/06/2019   AST 11 06/06/2019   ALT 5 06/06/2019   ALKPHOS 57 06/06/2019   BILITOT 0.3 06/06/2019   GFRNONAA 34 (L) 06/06/2019   GFRAA 39 (L) 06/06/2019    Lab Results  Component Value Date   WBC 5.0 01/28/2019   NEUTROABS 2.2 01/28/2019   HGB 11.3 01/28/2019   HCT 33.6 (L) 01/28/2019   MCV 86 01/28/2019   PLT 62 (LL) 01/28/2019     STUDIES: No results found.  ASSESSMENT: MGUS and ITP.  PLAN:    1.  ITP: Previously, bone marrow biopsy confirmed the results. Patient's platelet count is decreased, but stable.  She does not require treatment at this time. She last received Rituxan in September 2013.  Patient had a response to prednisone, but it was not durable.  Despite patient's thrombocytopenia, she has requested no further follow-up.  Please continue to monitor patient's platelet count 2-3 times per year and if it trends down below 30,000 or she becomes symptomatic please refer her back for reevaluation. 2. MGUS:  Bone marrow biopsy revealed less than 5% plasma cells.  His recent M spike was stable at 0.7. Continue to monitor SIEP once per year.  No follow-up per request of patient as above. 3. Weight loss: Patient does not complain of any further weight loss.  4. Smoking history: Patient underwent CT screening of the chest on February 03, 2017.  Patient expressed understanding and was in agreement  with this plan. She also understands that She can call clinic at any time with any questions, concerns, or complaints.    Lloyd Huger, MD   07/01/2019 5:08 PM

## 2019-07-04 ENCOUNTER — Other Ambulatory Visit: Payer: Self-pay

## 2019-07-05 ENCOUNTER — Inpatient Hospital Stay: Payer: Medicare Other | Admitting: Oncology

## 2019-08-05 ENCOUNTER — Other Ambulatory Visit: Payer: Self-pay

## 2019-08-05 ENCOUNTER — Ambulatory Visit: Payer: Medicare Other

## 2019-08-05 NOTE — Patient Outreach (Signed)
[facility]) Care Management  08/05/2019  Denise Everett [birth year] [id number]   Medication Adherence call to Denise Everett [facility] Identifiers Verify spoke with patient she is past due on Atorvastatin 10 mg patient explain she is still taking 1 tablet daily patient ask if we can call the pharmacy an order this medication,pharmacy will have it ready for the patient. Denise Everett is showing past due under [place].   [place] Management Direct Dial [phone]  Fax [phone] [email or web address]

## 2019-08-13 ENCOUNTER — Other Ambulatory Visit: Payer: Self-pay | Admitting: Nurse Practitioner

## 2019-08-13 NOTE — Telephone Encounter (Signed)
Requested medication (s) are due for refill today: yes  Requested medication (s) are on the active medication list: yes  Last refill:  07/15/2019  Future visit scheduled: yes  Notes to clinic: review for refill   Requested Prescriptions  Pending Prescriptions Disp Refills   risperiDONE (RISPERDAL) 2 MG tablet [Pharmacy Med Name: RISPERIDONE 2 MG TAB] 30 tablet 5    Sig: TAKE ONE-HALF TO ONE TABLET BY MOUTH AT BEDTIME     Not Delegated - Psychiatry:  Antipsychotics - Second Generation (Atypical) - risperidone Failed - 08/13/2019  8:50 AM      Failed - This refill cannot be delegated      Failed - Prolactin Level (serum) in normal range and within 180 days    No results found for: PROLACTIN, TOTPROLACTIN       Passed - ALT in normal range and within 180 days    ALT  Date Value Ref Range Status  06/06/2019 5 0 - 32 IU/L Final         Passed - AST in normal range and within 180 days    AST  Date Value Ref Range Status  06/06/2019 11 0 - 40 IU/L Final         Passed - Valid encounter within last 6 months    Recent Outpatient Visits          2 months ago Schizophrenia, unspecified type ([place])   [place], Jolene T, NP   6 months ago Schizophrenia, unspecified type ([place])   [place], Henrine Screws T, NP   1 year ago Need for influenza vaccination   [facility] [name], NP   1 year ago Essential hypertension   [facility] [name], NP   1 year ago Viral upper respiratory tract infection   [facility] [name], NP      Future Appointments            In 3 months  [place], [place]   In 3 months Cannady, Barbaraann Faster, NP [facility], [facility]            gabapentin (NEURONTIN) 300 MG capsule [employer] Med Name: GABAPENTIN 300 MG CAP] 90 capsule 3    Sig: TAKE 1 CAPSULE BY MOUTH 3 TIMES DAILY     Neurology: Anticonvulsants - gabapentin Passed - 08/13/2019  8:50  AM      Passed - Valid encounter within last 12 months    Recent Outpatient Visits          2 months ago Schizophrenia, unspecified type ([place])   [place], Jolene T, NP   6 months ago Schizophrenia, unspecified type ([place])   [place], Henrine Screws T, NP   1 year ago Need for influenza vaccination   [facility] [name], NP   1 year ago Essential hypertension   [facility] [name], NP   1 year ago Viral upper respiratory tract infection   [facility] [name], NP      Future Appointments            In 3 months  [place], [place]   In 3 months Cannady, Barbaraann Faster, NP [employer] [facility], [place]

## 2019-08-15 NOTE — Telephone Encounter (Signed)
Routing to provider. Disregard previous routing you will see at the bottom. Not thinking I sent it to Dr. Jeananne Rama.

## 2019-08-15 NOTE — Telephone Encounter (Signed)
Routing to provider  

## 2019-08-16 ENCOUNTER — Ambulatory Visit (INDEPENDENT_AMBULATORY_CARE_PROVIDER_SITE_OTHER): Payer: Medicare Other

## 2019-08-16 ENCOUNTER — Other Ambulatory Visit: Payer: Self-pay

## 2019-08-16 DIAGNOSIS — Z23 Encounter for immunization: Secondary | ICD-10-CM | POA: Diagnosis not present

## 2019-09-12 ENCOUNTER — Other Ambulatory Visit: Payer: Self-pay | Admitting: Unknown Physician Specialty

## 2019-09-12 NOTE — Telephone Encounter (Signed)
Routing to provider  

## 2019-09-12 NOTE — Telephone Encounter (Signed)
Requested medication (s) are due for refill today: yes  Requested medication (s) are on the active medication list: yes  Last refill:  06/16/2019  Future visit scheduled: yes  Notes to clinic:  Review for refill    Requested Prescriptions  Pending Prescriptions Disp Refills   raloxifene (EVISTA) 60 MG tablet [Pharmacy Med Name: RALOXIFENE HCL 60 MG TAB] 90 tablet 3    Sig: TAKE 1 TABLET BY MOUTH EVERY DAY     OB/GYN:  Selective Estrogen Receptor Modulators Passed - 09/12/2019 11:08 AM      Passed - Valid encounter within last 12 months    Recent Outpatient Visits          3 months ago Schizophrenia, unspecified type ([place])   [place], Jolene T, NP   7 months ago Schizophrenia, unspecified type ([place])   [place], Henrine Screws T, NP   1 year ago Need for influenza vaccination   [facility] [name], NP   1 year ago Essential hypertension   [place] [name], NP   1 year ago Viral upper respiratory tract infection   [place] [name], NP      Future Appointments            In 2 months  [place], [place]   In 2 months Cannady, Barbaraann Faster, NP [employer] [facility], [place]

## 2019-09-17 ENCOUNTER — Other Ambulatory Visit: Payer: Self-pay | Admitting: Nurse Practitioner

## 2019-09-17 NOTE — Telephone Encounter (Signed)
Forwarding medication refill request to PCP for review. 

## 2019-09-28 DIAGNOSIS — Z1231 Encounter for screening mammogram for malignant neoplasm of breast: Secondary | ICD-10-CM | POA: Diagnosis not present

## 2019-10-04 ENCOUNTER — Telehealth: Payer: Self-pay | Admitting: Nurse Practitioner

## 2019-10-04 NOTE — Chronic Care Management (AMB) (Signed)
°  Chronic Care Management   Outreach Note  10/04/2019 Name: Denise Everett MRN: [id number] DOB: [birth year]  Referred by: Venita Lick, NP Reason for referral : Chronic Care Management (Initial CCM outreach was unsuccessful. )   An unsuccessful telephone outreach was attempted today. The patient was referred to the case management team by for assistance with care management and care coordination.   Follow Up Plan: A HIPPA compliant phone message was left for the patient providing contact information and requesting a return call.  The care management team will reach out to the patient again over the next 7 days.  If patient returns call to provider office, please advise to call [place] at [place] [place] [facility]  [place], [place] [address] Direct Dial: [email or web address]  Website: [email or web address]

## 2019-10-19 NOTE — Chronic Care Management (AMB) (Signed)
Chronic Care Management   Note  10/19/2019 Name: Denise Everett MRN: [id number] DOB: 1951-09-14  Denise Everett is a 68 y.o. year old female who is a primary care patient of Cannady, Barbaraann Faster, NP. I reached out to [name] by phone today in response to a referral sent by [name] health plan.     [name] was given information about Chronic Care Management services today including:  1. CCM service includes personalized support from designated clinical staff supervised by her physician, including individualized plan of care and coordination with other care providers 2. 24/7 contact phone numbers for assistance for urgent and routine care needs. 3. Service will only be billed when office clinical staff spend 20 minutes or more in a month to coordinate care. 4. Only one practitioner may furnish and bill the service in a calendar month. 5. The patient may stop CCM services at any time (effective at the end of the month) by phone call to the office staff. 6. The patient will be responsible for cost sharing (co-pay) of up to 20% of the service fee (after annual deductible is met).  Patient agreed to services and verbal consent obtained.   Follow up plan: Telephone appointment with CCM team member scheduled for: 11/25/2019  [place], [place] [facility]  [name], [place] [address] Direct Dial: [email or web address]  Website: [email or web address]

## 2019-11-09 ENCOUNTER — Other Ambulatory Visit: Payer: Self-pay | Admitting: Unknown Physician Specialty

## 2019-11-09 NOTE — Telephone Encounter (Signed)
Requested medication (s) are due for refill today: no  Requested medication (s) are on the active medication list: yes  Last refill:  06/10/2017  Future visit scheduled: yes  Notes to clinic:  This refill cannot be delegated    Requested Prescriptions  Pending Prescriptions Disp Refills   traMADol (ULTRAM) 50 MG tablet [Pharmacy Med Name: TRAMADOL HCL 50 MG TAB] 30 tablet     Sig: TAKE 1 TABLET BY MOUTH EVERY 8 HOURS AS NEEDED      Not Delegated - Analgesics:  Opioid Agonists Failed - 11/09/2019  8:42 AM      Failed - This refill cannot be delegated      Failed - Urine Drug Screen completed in last 360 days.      Passed - Valid encounter within last 6 months    Recent Outpatient Visits           5 months ago Schizophrenia, unspecified type ([place])   [place], Jolene T, NP   9 months ago Schizophrenia, unspecified type ([place])   [place], Barbaraann Faster, NP   1 year ago Need for influenza vaccination   [facility] [name], NP   1 year ago Essential hypertension   [place] [name], NP   1 year ago Viral upper respiratory tract infection   [facility] [name], NP       Future Appointments             In 1 week  [facility], [place]   In 3 weeks Cannady, Barbaraann Faster, NP [facility]

## 2019-11-09 NOTE — Telephone Encounter (Signed)
Would need visit for this, has not had since 2018.  For acute pain would only get 5 day supply during visit and if needed for chronic pain, then would have to discuss other options.  Thank you.

## 2019-11-09 NOTE — Telephone Encounter (Signed)
Called patient. She states that she thinks she is taking tramadol but verified med list with patient and she said we have listed is correct.  Called and verified with the pharmacy that medication has not be dispensed in the last 2 years. Please refuse.

## 2019-11-09 NOTE — Telephone Encounter (Signed)
Routing to provider. Patient last seen 06/06/19

## 2019-11-16 ENCOUNTER — Ambulatory Visit (INDEPENDENT_AMBULATORY_CARE_PROVIDER_SITE_OTHER): Payer: Medicare Other

## 2019-11-16 VITALS — Ht 62.0 in | Wt 132.0 lb

## 2019-11-16 DIAGNOSIS — Z0000 Encounter for general adult medical examination without abnormal findings: Secondary | ICD-10-CM | POA: Diagnosis not present

## 2019-11-16 DIAGNOSIS — Z1211 Encounter for screening for malignant neoplasm of colon: Secondary | ICD-10-CM | POA: Diagnosis not present

## 2019-11-16 NOTE — Patient Instructions (Signed)
Denise Everett , Thank you for taking time to come for your Medicare Wellness Visit. I appreciate your ongoing commitment to your health goals. Please review the following plan we discussed and let me know if I can assist you in the future.   Screening recommendations/referrals: Colonoscopy: cologuard ordered  Mammogram: Please call [phone] to schedule your mammogram.  Bone Density: up to date  Recommended yearly ophthalmology/optometry visit for glaucoma screening and checkup Recommended yearly dental visit for hygiene and checkup  Vaccinations: Influenza vaccine: up to date  Pneumococcal vaccine: up to date Tdap vaccine: due now  Shingles vaccine: shingrix eligible     Advanced directives: Advance directive discussed with you today.Once this is complete please bring a copy in to our office so we can scan it into your chart.  Conditions/risks identified: someone will contact you in regards to possible financial assistance programs.   Next appointment: follow up in one year for your annual wellness visit    Preventive Care 65 Years and Older, Female Preventive care refers to lifestyle choices and visits with your health care provider that can promote health and wellness. What does preventive care include?  A yearly physical exam. This is also called an annual well check.69  Dental exams once or twice a year.  Routine eye exams. Ask your health care provider how often you should have your eyes checked.  Personal lifestyle choices, including:  Daily care of your teeth and gums.  Regular physical activity.  Eating a healthy diet.  Avoiding tobacco and drug use.  Limiting alcohol use.  Practicing safe sex.  Taking low-dose aspirin every day.  Taking vitamin and mineral supplements as recommended by your health care provider. What happens during an annual well check? The services and screenings done by your health care provider during your annual well check will depend on  your age, overall health, lifestyle risk factors, and family history of disease. Counseling  Your health care provider may ask you questions about your:  Alcohol use.  Tobacco use.  Drug use.  Emotional well-being.  Home and relationship well-being.  Sexual activity.  Eating habits.  History of falls.  Memory and ability to understand (cognition).  Work and work Statistician.  Reproductive health. Screening  You may have the following tests or measurements:  Height, weight, and BMI.  Blood pressure.  Lipid and cholesterol levels. These may be checked every 5 years, or more frequently if you are over 71 years old.  Skin check.  Lung cancer screening. You may have this screening every year starting at age 33 if you have a 30-pack-year history of smoking and currently smoke or have quit within the past 15 years.  Fecal occult blood test (FOBT) of the stool. You may have this test every year starting at age 71.  Flexible sigmoidoscopy or colonoscopy. You may have a sigmoidoscopy every 5 years or a colonoscopy every 10 years starting at age 71.  Hepatitis C blood test.  Hepatitis B blood test.  Sexually transmitted disease (STD) testing.  Diabetes screening. This is done by checking your blood sugar (glucose) after you have not eaten for a while (fasting). You may have this done every 1-3 years.  Bone density scan. This is done to screen for osteoporosis. You may have this done starting at age 71.  Mammogram. This may be done every 1-2 years. Talk to your health care provider about how often you should have regular mammograms. Talk with your health care provider about your test  results, treatment options, and if necessary, the need for more tests. Vaccines  Your health care provider may recommend certain vaccines, such as:  Influenza vaccine. This is recommended every year.  Tetanus, diphtheria, and acellular pertussis (Tdap, Td) vaccine. You may need a Td booster  every 10 years.  Zoster vaccine. You may need this after age 71.  Pneumococcal 13-valent conjugate (PCV13) vaccine. One dose is recommended after age 71.  Pneumococcal polysaccharide (PPSV23) vaccine. One dose is recommended after age 71. Talk to your health care provider about which screenings and vaccines you need and how often you need them. This information is not intended to replace advice given to you by your health care provider. Make sure you discuss any questions you have with your health care provider. Document Released: 11/23/2015 Document Revised: 07/16/2016 Document Reviewed: 08/28/2015 Elsevier Interactive Patient Education  2017 [place] Prevention in the Home Falls can cause injuries. They can happen to people of all ages. There are many things you can do to make your home safe and to help prevent falls. What can I do on the outside of my home?  Regularly fix the edges of walkways and driveways and fix any cracks.  Remove anything that might make you trip as you walk through a door, such as a raised step or threshold.  Trim any bushes or trees on the path to your home.  Use bright outdoor lighting.  Clear any walking paths of anything that might make someone trip, such as rocks or tools.  Regularly check to see if handrails are loose or broken. Make sure that both sides of any steps have handrails.  Any raised decks and porches should have guardrails on the edges.  Have any leaves, snow, or ice cleared regularly.  Use sand or salt on walking paths during winter.  Clean up any spills in your garage right away. This includes oil or grease spills. What can I do in the bathroom?  Use night lights.  Install grab bars by the toilet and in the tub and shower. Do not use towel bars as grab bars.  Use non-skid mats or decals in the tub or shower.  If you need to sit down in the shower, use a plastic, non-slip stool.  Keep the floor dry. Clean up any  water that spills on the floor as soon as it happens.  Remove soap buildup in the tub or shower regularly.  Attach bath mats securely with double-sided non-slip rug tape.  Do not have throw rugs and other things on the floor that can make you trip. What can I do in the bedroom?  Use night lights.  Make sure that you have a light by your bed that is easy to reach.  Do not use any sheets or blankets that are too big for your bed. They should not hang down onto the floor.  Have a firm chair that has side arms. You can use this for support while you get dressed.  Do not have throw rugs and other things on the floor that can make you trip. What can I do in the kitchen?  Clean up any spills right away.  Avoid walking on wet floors.  Keep items that you use a lot in easy-to-reach places.  If you need to reach something above you, use a strong step stool that has a grab bar.  Keep electrical cords out of the way.  Do not use floor polish or wax that makes  floors slippery. If you must use wax, use non-skid floor wax.  Do not have throw rugs and other things on the floor that can make you trip. What can I do with my stairs?  Do not leave any items on the stairs.  Make sure that there are handrails on both sides of the stairs and use them. Fix handrails that are broken or loose. Make sure that handrails are as long as the stairways.  Check any carpeting to make sure that it is firmly attached to the stairs. Fix any carpet that is loose or worn.  Avoid having throw rugs at the top or bottom of the stairs. If you do have throw rugs, attach them to the floor with carpet tape.  Make sure that you have a light switch at the top of the stairs and the bottom of the stairs. If you do not have them, ask someone to add them for you. What else can I do to help prevent falls?  Wear shoes that:  Do not have high heels.  Have rubber bottoms.  Are comfortable and fit you well.  Are closed  at the toe. Do not wear sandals.  If you use a stepladder:  Make sure that it is fully opened. Do not climb a closed stepladder.  Make sure that both sides of the stepladder are locked into place.  Ask someone to hold it for you, if possible.  Clearly mark and make sure that you can see:  Any grab bars or handrails.  First and last steps.  Where the edge of each step is.  Use tools that help you move around (mobility aids) if they are needed. These include:  Canes.  Walkers.  Scooters.  Crutches.  Turn on the lights when you go into a dark area. Replace any light bulbs as soon as they burn out.  Set up your furniture so you have a clear path. Avoid moving your furniture around.  If any of your floors are uneven, fix them.  If there are any pets around you, be aware of where they are.  Review your medicines with your doctor. Some medicines can make you feel dizzy. This can increase your chance of falling. Ask your doctor what other things that you can do to help prevent falls. This information is not intended to replace advice given to you by your health care provider. Make sure you discuss any questions you have with your health care provider. Document Released: 08/23/2009 Document Revised: 04/03/2016 Document Reviewed: 12/01/2014 Elsevier Interactive Patient Education  2017 [employer].

## 2019-11-16 NOTE — Progress Notes (Signed)
Subjective:   Denise Everett is a 69 y.o. female who presents for Medicare Annual (Subsequent) preventive examination.  This visit is being conducted via phone call  - after an attmept to do on video chat - due to the COVID-19 pandemic. This patient has given me verbal consent via phone to conduct this visit, patient states they are participating from their home address. Some vital signs may be absent or patient reported.   Patient identification: identified by name, DOB, and current address.    Review of Systems:   Cardiac Risk Factors include: advanced age (>70men, >53 women);hypertension;dyslipidemia;smoking/ tobacco exposure     Objective:     Vitals: Ht 5\' 2"  (1.575 m)   Wt 132 lb (59.9 kg)   LMP  (LMP Unknown)   BMI 24.14 kg/m   Body mass index is 24.14 kg/m.  Advanced Directives 11/16/2019 11/11/2018 11/24/2017 09/16/2017 05/29/2017 02/24/2017 01/14/2017  Does Patient Have a Medical Advance Directive? No No No No No No No  Would patient like information on creating a medical advance directive? - Yes (MAU/Ambulatory/Procedural Areas - Information given) - Yes (MAU/Ambulatory/Procedural Areas - Information given) No - Patient declined No - Patient declined No - Patient declined    Tobacco Social History   Tobacco Use  Smoking Status Former Smoker  . Packs/day: 1.00  . Years: 43.00  . Pack years: 43.00  . Types: Cigarettes  . Quit date: 07/21/2017  . Years since quitting: 2.3  Smokeless Tobacco Former Systems developer  . Types: Snuff     Counseling given: Not Answered   Clinical Intake:  Pre-visit preparation completed: Yes  Pain : No/denies pain     Nutritional Status: BMI of 19-24  Normal Nutritional Risks: None Diabetes: No  How often do you need to have someone help you when you read instructions, pamphlets, or other written materials from your doctor or pharmacy?: 1 - Never  Interpreter Needed?: No  Information entered by :: Denise Virgil,LPN  Past Medical  History:  Diagnosis Date  . Abnormal weight loss 06/07/2015  . Allergy   . Cataract   . Chronic kidney disease   . Chronic pain syndrome   . Hyperlipidemia   . Hypertension   . Leg pain   . Osteoporosis    hips  . Renal insufficiency   . Schizophrenia ([place])   . Thrombocytopenia ([place])   . Tobacco use disorder    Past Surgical History:  Procedure Laterality Date  . ankle surgery Right    x's 2  . CATARACT EXTRACTION     Family History  Problem Relation Age of Onset  . Hyperlipidemia Mother   . Hypertension Mother    Social History   Socioeconomic History  . Marital status: Single    Spouse name: Not on file  . Number of children: Not on file  . Years of education: Not on file  . Highest education level: Not on file  Occupational History  . Not on file  Tobacco Use  . Smoking status: Former Smoker    Packs/day: 1.00    Years: 43.00    Pack years: 43.00    Types: Cigarettes    Quit date: 07/21/2017    Years since quitting: 2.3  . Smokeless tobacco: Former Systems developer    Types: Snuff  Substance and Sexual Activity  . Alcohol use: No  . Drug use: No  . Sexual activity: Yes  Other Topics Concern  . Not on file  Social History Narrative  .  Not on file   Social Determinants of Health   Financial Resource Strain:   . Difficulty of Paying Living Expenses: Not on file  Food Insecurity:   . Worried About Charity fundraiser in the Last Year: Not on file  . Ran Out of Food in the Last Year: Not on file  Transportation Needs:   . Lack of Transportation (Medical): Not on file  . Lack of Transportation (Non-Medical): Not on file  Physical Activity:   . Days of Exercise per Week: Not on file  . Minutes of Exercise per Session: Not on file  Stress:   . Feeling of Stress : Not on file  Social Connections:   . Frequency of Communication with Friends and Family: Not on file  . Frequency of Social Gatherings with Friends and Family: Not on file  . Attends Religious  Services: Not on file  . Active Member of Clubs or Organizations: Not on file  . Attends Archivist Meetings: Not on file  . Marital Status: Not on file    Outpatient Encounter Medications as of 11/16/2019  Medication Sig  . Aspirin-Acetaminophen-Caffeine (GOODYS EXTRA STRENGTH) [phone] MG PACK Take by mouth.  Marland Kitchen atorvastatin (LIPITOR) 10 MG tablet Take 1 tablet (10 mg total) by mouth daily at 6 PM.  . gabapentin (NEURONTIN) 300 MG capsule TAKE 1 CAPSULE BY MOUTH 3 TIMES DAILY  . lisinopril (ZESTRIL) 10 MG tablet TAKE ONE-HALF TABLET BY MOUTH DAILY  . raloxifene (EVISTA) 60 MG tablet TAKE 1 TABLET BY MOUTH EVERY DAY  . risperiDONE (RISPERDAL) 2 MG tablet TAKE ONE-HALF TO ONE TABLET BY MOUTH AT BEDTIME   No facility-administered encounter medications on file as of 11/16/2019.    Activities of Daily Living In your present state of health, do you have any difficulty performing the following activities: 11/16/2019  Hearing? N  Comment no hearing aids  Vision? N  Comment [facility]. glasses  Difficulty concentrating or making decisions? N  Walking or climbing stairs? Y  Dressing or bathing? N  Doing errands, shopping? N  Preparing Food and eating ? N  Using the Toilet? N  In the past six months, have you accidently leaked urine? N  Do you have problems with loss of bowel control? N  Managing your Medications? N  Managing your Finances? N  Housekeeping or managing your Housekeeping? N  Some recent data might be hidden    Patient Care Team: Denise Lick, NP as PCP - General (Nurse Practitioner) Lloyd Huger, MD as Consulting Physician (Oncology) Minor, Dalbert Garnet, RN as [place] Management    Assessment:   This is a routine wellness examination for Denise Everett.  Exercise Activities and Dietary recommendations Current Exercise Habits: Home exercise routine, Type of exercise: walking, Time (Minutes): 20, Frequency (Times/Week): 7, Weekly  Exercise (Minutes/Week): 140, Intensity: Mild, Exercise limited by: None identified  Goals    . Decrease soda or juice intake     Decrease soda intake    . Increase water intake     Recommend drinking at least 6-7 glasses of water a day        Fall Risk: Fall Risk  11/16/2019 11/11/2018 09/16/2017 04/16/2017 02/24/2017  Falls in the past year? 0 0 No Yes No  Number falls in past yr: 0 - - 1 -  Injury with Fall? 0 - - Yes -  Comment - - - bruised left knee.  had xray -  Risk for fall  due to : - - - (No Data) -  Risk for fall due to: Comment - - - fell off of  he bed -  Follow up - - - Falls prevention discussed -    FALL RISK PREVENTION PERTAINING TO THE HOME:  Any stairs in or around the home? No  If so, are there any without handrails? No   Home free of loose throw rugs in walkways, pet beds, electrical cords, etc? Yes  Adequate lighting in your home to reduce risk of falls? Yes   ASSISTIVE DEVICES UTILIZED TO PREVENT FALLS:  Life alert? No  Use of a cane, walker or w/c? Yes  cane as needed  Grab bars in the bathroom? No  Shower chair or bench in shower? No  Elevated toilet seat or a handicapped toilet? No   DME ORDERS:  DME order needed?  No   TIMED UP AND GO:  Unable to perform    Depression Screen PHQ 2/9 Scores 11/16/2019 11/11/2018 09/16/2017 04/16/2017  PHQ - 2 Score 0 0 1 0  PHQ- 9 Score - - 2 -     Cognitive Function     6CIT Screen 11/11/2018 09/16/2017  What Year? 0 points 0 points  What month? 0 points 0 points  What time? 0 points 0 points  Count back from 20 0 points 0 points  Months in reverse 2 points 0 points  Repeat phrase 4 points 8 points  Total Score 6 8    Immunization History  Administered Date(s) Administered  . Fluad Quad(high Dose 65+) 08/16/2019  . Influenza, High Dose Seasonal PF 08/08/2016, 09/16/2017, 07/30/2018  . Influenza,inj,Quad PF,6+ Mos 01/01/2016  . Pneumococcal Conjugate-13 10/16/2014  . Pneumococcal Polysaccharide-23  01/01/2016  . Td 04/15/1999, 07/26/2009    Qualifies for Shingles Vaccine? Yes  Zostavax completed n/a. Due for Shingrix. Education has been provided regarding the importance of this vaccine. Pt has been advised to call insurance company to determine out of pocket expense. Advised may also receive vaccine at local pharmacy or Health Dept. Verbalized acceptance and understanding.  Tdap: Discussed need for TD/TDAP vaccine, patient verbalized understanding that this is not covered as a preventative with there insurance and to call the office if she develops any new skin injuries, ie: cuts, scrapes, bug bites, or open wounds.  Flu Vaccine:up to date   Pneumococcal Vaccine: up to date                                          Screening Tests Health Maintenance  Topic Date Due  . Fecal DNA (Cologuard)  03/13/1999  . MAMMOGRAM  10/21/2018  . COLON CANCER SCREENING ANNUAL FOBT  01/19/2019  . TETANUS/TDAP  11/15/2020 (Originally 07/27/2019)  . INFLUENZA VACCINE  Completed  . DEXA SCAN  Completed  . Hepatitis C Screening  Completed  . PNA vac Low Risk Adult  Completed    Cancer Screenings:  Colorectal Screening: cologuard ordered   Mammogram: ordered   Bone Density: completed 04/17/2010  Lung Cancer Screening: (Low Dose CT Chest recommended if Age 40-80 years, 30 pack-year currently smoking OR have quit w/in 15years.) does qualify.   Cancelled due to virus. Will get Shawn to reach back out to pt to reschedule   Additional Screening:  Hepatitis C Screening: does qualify; Completed 2017  Vision Screening: Recommended annual ophthalmology exams for early detection of glaucoma and  other disorders of the eye. Is the patient up to date with their annual eye exam?  No  Who is the provider or what is the name of the office in which the pt attends annual eye exams? [facility]    Dental Screening: Recommended annual dental exams for proper oral hygiene  Community Resource Referral:   CRR required this visit?  Yes       Plan:  I have personally reviewed and addressed the Medicare Annual Wellness questionnaire and have noted the following in the patient's chart:  A. Medical and social history B. Use of alcohol, tobacco or illicit drugs  C. Current medications and supplements D. Functional ability and status E.  Nutritional status F.  Physical activity G. Advance directives H. List of other physicians I.  Hospitalizations, surgeries, and ER visits in previous 12 months J.  [place] such as hearing and vision if needed, cognitive and depression L. Referrals and appointments   In addition, I have reviewed and discussed with patient certain preventive protocols, quality metrics, and best practice recommendations. A written personalized care plan for preventive services as well as general preventive health recommendations were provided to patient.  Signed,    Bevelyn Ngo, LPN  0/06/8109 Nurse Health Advisor   Nurse Notes: states she picked up last refill of risperidone. Has appt 12/05/2018

## 2019-11-23 ENCOUNTER — Other Ambulatory Visit: Payer: Self-pay | Admitting: Nurse Practitioner

## 2019-11-25 ENCOUNTER — Ambulatory Visit: Payer: Medicare Other

## 2019-11-25 NOTE — Chronic Care Management (AMB) (Signed)
  Chronic Care Management   Outreach Note  11/25/2019 Name: Denise Everett MRN: [id number] DOB: [birth year]  Referred by: Venita Lick, NP Reason for referral : No chief complaint on file.   An unsuccessful telephone outreach was attempted today. The patient was referred to the case management team by for assistance with care management and care coordination. Spoke to [name] who took down the number for the CM.  Was trying to reach the patient to discuss help with light bill and other questions or concerns.   Follow Up Plan: The care management team will reach out to the patient again over the next 7 days.   Noreene Larsson RN, MSN, [facility] Mobile: [phone]

## 2019-11-29 ENCOUNTER — Ambulatory Visit: Payer: Self-pay | Admitting: General Practice

## 2019-11-29 ENCOUNTER — Telehealth: Payer: Medicare Other

## 2019-11-29 NOTE — Chronic Care Management (AMB) (Signed)
  Chronic Care Management   Outreach Note  11/29/2019 Name: Denise Everett MRN: [id number] DOB: 1951-07-16  Referred by: Venita Lick, NP Reason for referral : No chief complaint on file.   An unsuccessful telephone outreach was attempted today. The patient was referred to the case management team by for assistance with care management and care coordination.   Follow Up Plan: Telephone follow up appointment with care management team member scheduled for: 12-20-2019 at 10 am  [place], MSN, [facility] Mobile: [phone]

## 2019-12-06 ENCOUNTER — Encounter: Payer: Self-pay | Admitting: Nurse Practitioner

## 2019-12-06 ENCOUNTER — Ambulatory Visit (INDEPENDENT_AMBULATORY_CARE_PROVIDER_SITE_OTHER): Payer: Medicare Other | Admitting: Nurse Practitioner

## 2019-12-06 DIAGNOSIS — E7800 Pure hypercholesterolemia, unspecified: Secondary | ICD-10-CM | POA: Diagnosis not present

## 2019-12-06 DIAGNOSIS — I129 Hypertensive chronic kidney disease with stage 1 through stage 4 chronic kidney disease, or unspecified chronic kidney disease: Secondary | ICD-10-CM

## 2019-12-06 DIAGNOSIS — N1831 Chronic kidney disease, stage 3a: Secondary | ICD-10-CM | POA: Diagnosis not present

## 2019-12-06 DIAGNOSIS — I10 Essential (primary) hypertension: Secondary | ICD-10-CM

## 2019-12-06 DIAGNOSIS — F209 Schizophrenia, unspecified: Secondary | ICD-10-CM | POA: Diagnosis not present

## 2019-12-06 DIAGNOSIS — D693 Immune thrombocytopenic purpura: Secondary | ICD-10-CM | POA: Diagnosis not present

## 2019-12-06 NOTE — Assessment & Plan Note (Signed)
Chronic, stable.  Continue current medication regimen, Risperdal, and adjust as needed.  Return in 6 months.

## 2019-12-06 NOTE — Patient Instructions (Signed)
DASH Eating Plan DASH stands for "Dietary Approaches to Stop Hypertension." The DASH eating plan is a healthy eating plan that has been shown to reduce high blood pressure (hypertension). It may also reduce your risk for type 2 diabetes, heart disease, and stroke. The DASH eating plan may also help with weight loss. What are tips for following this plan?  General guidelines  Avoid eating more than 2,300 mg (milligrams) of salt (sodium) a day. If you have hypertension, you may need to reduce your sodium intake to 1,500 mg a day.  Limit alcohol intake to no more than 1 drink a day for nonpregnant women and 2 drinks a day for men. One drink equals 12 oz of beer, 5 oz of wine, or 1 oz of hard liquor.  Work with your health care provider to maintain a healthy body weight or to lose weight. Ask what an ideal weight is for you.  Get at least 30 minutes of exercise that causes your heart to beat faster (aerobic exercise) most days of the week. Activities may include walking, swimming, or biking.  Work with your health care provider or diet and nutrition specialist (dietitian) to adjust your eating plan to your individual calorie needs. Reading food labels   Check food labels for the amount of sodium per serving. Choose foods with less than 5 percent of the Daily Value of sodium. Generally, foods with less than 300 mg of sodium per serving fit into this eating plan.  To find whole grains, look for the word "whole" as the first word in the ingredient list. Shopping  Buy products labeled as "low-sodium" or "no salt added."  Buy fresh foods. Avoid canned foods and premade or frozen meals. Cooking  Avoid adding salt when cooking. Use salt-free seasonings or herbs instead of table salt or sea salt. Check with your health care provider or pharmacist before using salt substitutes.  Do not fry foods. Cook foods using healthy methods such as baking, boiling, grilling, and broiling instead.  Cook with  heart-healthy oils, such as olive, canola, soybean, or sunflower oil. Meal planning  Eat a balanced diet that includes: ? 5 or more servings of fruits and vegetables each day. At each meal, try to fill half of your plate with fruits and vegetables. ? Up to 6-8 servings of whole grains each day. ? Less than 6 oz of lean meat, poultry, or fish each day. A 3-oz serving of meat is about the same size as a deck of cards. One egg equals 1 oz. ? 2 servings of low-fat dairy each day. ? A serving of nuts, seeds, or beans 5 times each week. ? Heart-healthy fats. Healthy fats called Omega-3 fatty acids are found in foods such as flaxseeds and coldwater fish, like sardines, salmon, and mackerel.  Limit how much you eat of the following: ? Canned or prepackaged foods. ? Food that is high in trans fat, such as fried foods. ? Food that is high in saturated fat, such as fatty meat. ? Sweets, desserts, sugary drinks, and other foods with added sugar. ? Full-fat dairy products.  Do not salt foods before eating.  Try to eat at least 2 vegetarian meals each week.  Eat more home-cooked food and less restaurant, buffet, and fast food.  When eating at a restaurant, ask that your food be prepared with less salt or no salt, if possible. What foods are recommended? The items listed may not be a complete list. Talk with your dietitian about   what dietary choices are best for you. Grains Whole-grain or whole-wheat bread. Whole-grain or whole-wheat pasta. Brown rice. Oatmeal. Quinoa. Bulgur. Whole-grain and low-sodium cereals. Pita bread. Low-fat, low-sodium crackers. Whole-wheat flour tortillas. Vegetables Fresh or frozen vegetables (raw, steamed, roasted, or grilled). Low-sodium or reduced-sodium tomato and vegetable juice. Low-sodium or reduced-sodium tomato sauce and tomato paste. Low-sodium or reduced-sodium canned vegetables. Fruits All fresh, dried, or frozen fruit. Canned fruit in natural juice (without  added sugar). Meat and other protein foods Skinless chicken or turkey. Ground chicken or turkey. Pork with fat trimmed off. Fish and seafood. Egg whites. Dried beans, peas, or lentils. Unsalted nuts, nut butters, and seeds. Unsalted canned beans. Lean cuts of beef with fat trimmed off. Low-sodium, lean deli meat. Dairy Low-fat (1%) or fat-free (skim) milk. Fat-free, low-fat, or reduced-fat cheeses. Nonfat, low-sodium ricotta or cottage cheese. Low-fat or nonfat yogurt. Low-fat, low-sodium cheese. Fats and oils Soft margarine without trans fats. Vegetable oil. Low-fat, reduced-fat, or light mayonnaise and salad dressings (reduced-sodium). Canola, safflower, olive, soybean, and sunflower oils. Avocado. Seasoning and other foods Herbs. Spices. Seasoning mixes without salt. Unsalted popcorn and pretzels. Fat-free sweets. What foods are not recommended? The items listed may not be a complete list. Talk with your dietitian about what dietary choices are best for you. Grains Baked goods made with fat, such as croissants, muffins, or some breads. Dry pasta or rice meal packs. Vegetables Creamed or fried vegetables. Vegetables in a cheese sauce. Regular canned vegetables (not low-sodium or reduced-sodium). Regular canned tomato sauce and paste (not low-sodium or reduced-sodium). Regular tomato and vegetable juice (not low-sodium or reduced-sodium). Pickles. Olives. Fruits Canned fruit in a light or heavy syrup. Fried fruit. Fruit in cream or butter sauce. Meat and other protein foods Fatty cuts of meat. Ribs. Fried meat. Bacon. Sausage. Bologna and other processed lunch meats. Salami. Fatback. Hotdogs. Bratwurst. Salted nuts and seeds. Canned beans with added salt. Canned or smoked fish. Whole eggs or egg yolks. Chicken or turkey with skin. Dairy Whole or 2% milk, cream, and half-and-half. Whole or full-fat cream cheese. Whole-fat or sweetened yogurt. Full-fat cheese. Nondairy creamers. Whipped toppings.  Processed cheese and cheese spreads. Fats and oils Butter. Stick margarine. Lard. Shortening. Ghee. Bacon fat. Tropical oils, such as coconut, palm kernel, or palm oil. Seasoning and other foods Salted popcorn and pretzels. Onion salt, garlic salt, seasoned salt, table salt, and sea salt. Worcestershire sauce. Tartar sauce. Barbecue sauce. Teriyaki sauce. Soy sauce, including reduced-sodium. Steak sauce. Canned and packaged gravies. Fish sauce. Oyster sauce. Cocktail sauce. Horseradish that you find on the shelf. Ketchup. Mustard. Meat flavorings and tenderizers. Bouillon cubes. Hot sauce and Tabasco sauce. Premade or packaged marinades. Premade or packaged taco seasonings. Relishes. Regular salad dressings. Where to find more information:  National Heart, Lung, and Blood Institute: www.nhlbi.nih.gov  American Heart Association: www.heart.org Summary  The DASH eating plan is a healthy eating plan that has been shown to reduce high blood pressure (hypertension). It may also reduce your risk for type 2 diabetes, heart disease, and stroke.  With the DASH eating plan, you should limit salt (sodium) intake to 2,300 mg a day. If you have hypertension, you may need to reduce your sodium intake to 1,500 mg a day.  When on the DASH eating plan, aim to eat more fresh fruits and vegetables, whole grains, lean proteins, low-fat dairy, and heart-healthy fats.  Work with your health care provider or diet and nutrition specialist (dietitian) to adjust your eating plan to your   individual calorie needs. This information is not intended to replace advice given to you by your health care provider. Make sure you discuss any questions you have with your health care provider. Document Revised: 10/09/2017 Document Reviewed: 10/20/2016 Elsevier Patient Education  2020 Elsevier Inc.  

## 2019-12-06 NOTE — Assessment & Plan Note (Signed)
Continue to collaborate with hematology, recommend she ensure she schedule follow-up with them.  Will obtain CBC outpatient since last one was in March 2020.  She denies bleeding or increased bruising.

## 2019-12-06 NOTE — Progress Notes (Signed)
BP (!) 140/110   LMP  (LMP Unknown)    Subjective:    Patient ID: Denise Everett, female    DOB: 02-07-1951, 69 y.o.   MRN: [id number]  HPI: Denise Everett is a 69 y.o. female  Chief Complaint  Patient presents with  . Hyperlipidemia  . Hypertension  . Schizophrenia    . This visit was completed via telephone due to the restrictions of the COVID-19 pandemic. All issues as above were discussed and addressed but no physical exam was performed. If it was felt that the patient should be evaluated in the office, they were directed there. The patient verbally consented to this visit. Patient was unable to complete an audio/visual visit due to Lack of equipment. Due to the catastrophic nature of the COVID-19 pandemic, this visit was done through audio contact only. . Location of the patient: home . Location of the provider: work . Those involved with this call:  . Provider: Marnee Guarneri, DNP . CMA: Yvonna Alanis, CMA . Front Desk/Registration: Don Perking  . Time spent on call: 15 minutes on the phone discussing health concerns. 10 minutes total spent in review of patient's record and preparation of their chart.  . I verified patient identity using two factors (patient name and date of birth). Patient consents verbally to being seen via telemedicine visit today.    HYPERTENSION / HYPERLIPIDEMIA Taking Atorvastatin 10 MG daily and Lisinopril 5 MG daily.  Nephrology cancelled her recent appointment and she is working on rescheduling.  Also missed colonoscopy July 2020. Satisfied with current treatment? yes Duration of hypertension: chronic BP monitoring frequency: not checking BP range:  BP medication side effects: no Duration of hyperlipidemia: chronic Cholesterol medication side effects: yes Cholesterol supplements: none Medication compliance: good compliance Aspirin: no Recent stressors: no Recurrent headaches: no Visual changes: no Palpitations: no Dyspnea:  no Chest pain: no Lower extremity edema: no Dizzy/lightheaded: no   SCHIZOPHRENIA: This is well-controlled with her medication. Prior to medication she had auditory hallucinations. Diagnosed about 20 years ago. Denies any behaviors or auditory hallucinations. States mood has been stable with Risperdal.  IDIOPATHIC THROMBOCYTOPENIC PURPURA: Chronic, ongoing. Followed by Dr. Grayland Ormond, but has missed several follow-up visits and plans to reschedule. Last visit to see him was to be on August 25th, 2020.  On review PLT average mid-40's to 60's. Denies any pain, increased bruising, or decrease in ROM.  PLT in March 2020 == 62.   Relevant past medical, surgical, family and social history reviewed and updated as indicated. Interim medical history since our last visit reviewed. Allergies and medications reviewed and updated.  Review of Systems  Constitutional: Negative for activity change, appetite change, diaphoresis, fatigue and fever.  Respiratory: Negative for cough, chest tightness and shortness of breath.   Cardiovascular: Negative for chest pain, palpitations and leg swelling.  Psychiatric/Behavioral: Negative.     Per HPI unless specifically indicated above     Objective:    BP (!) 140/110   LMP  (LMP Unknown)   Wt Readings from Last 3 Encounters:  11/16/19 132 lb (59.9 kg)  01/28/19 137 lb (62.1 kg)  11/11/18 137 lb (62.1 kg)    Physical Exam   Unable to perform telephone visit only  Results for orders placed or performed in visit on 06/06/19  Lipid Panel w/o Chol/HDL Ratio  Result Value Ref Range   Cholesterol, Total 150 100 - 199 mg/dL   Triglycerides 77 0 - 149 mg/dL   HDL 50 >39  mg/dL   VLDL Cholesterol Cal 15 5 - 40 mg/dL   LDL Calculated 85 0 - 99 mg/dL  Comprehensive metabolic panel  Result Value Ref Range   Glucose 72 65 - 99 mg/dL   BUN 7 (L) 8 - 27 mg/dL   Creatinine, Ser 1.55 (H) 0.57 - 1.00 mg/dL   GFR calc non Af Amer 34 (L) >59 mL/min/1.73    GFR calc Af Amer 39 (L) >59 mL/min/1.73   BUN/Creatinine Ratio 5 (L) 12 - 28   Sodium 139 134 - 144 mmol/L   Potassium 3.3 (L) 3.5 - 5.2 mmol/L   Chloride 99 96 - 106 mmol/L   CO2 22 20 - 29 mmol/L   Calcium 8.5 (L) 8.7 - 10.3 mg/dL   Total Protein 6.2 6.0 - 8.5 g/dL   Albumin 4.0 3.8 - 4.8 g/dL   Globulin, Total 2.2 1.5 - 4.5 g/dL   Albumin/Globulin Ratio 1.8 1.2 - 2.2   Bilirubin Total 0.3 0.0 - 1.2 mg/dL   Alkaline Phosphatase 57 39 - 117 IU/L   AST 11 0 - 40 IU/L   ALT 5 0 - 32 IU/L      Assessment & Plan:   Problem List Items Addressed This Visit      Cardiovascular and Mediastinum   Hypertension    Chronic, ongoing with office BP on review at goal. Continue current medication regimen and adjust as needed.  CMP outpatient.  Recommend she check BP at home at least 3 mornings a week and document.  Return in 6 months.      Relevant Orders   Comprehensive metabolic panel     Musculoskeletal and Integument   Idiopathic thrombocytopenic purpura (HCC) (Chronic)    Continue to collaborate with hematology, recommend she ensure she schedule follow-up with them.  Will obtain CBC outpatient since last one was in March 2020.  She denies bleeding or increased bruising.      Relevant Orders   CBC with Differential/Platelet     Genitourinary   Hypertensive CKD (chronic kidney disease)    Chronic, ongoing.  Continue current medication regimen and collaboration with nephrology.  CMP outpatient.  Recommend she schedule follow-up with nephrology in next month.      Relevant Orders   Comprehensive metabolic panel     Other   Schizophrenia (HCC)    Chronic, stable.  Continue current medication regimen, Risperdal, and adjust as needed.  Return in 6 months.      Hyperlipidemia    Chronic, ongoing.  Continue current medication regimen and adjust as needed.  Lipid panel outpatient.        Relevant Orders   Lipid Panel w/o Chol/HDL Ratio   Comprehensive metabolic panel      I  discussed the assessment and treatment plan with the patient. The patient was provided an opportunity to ask questions and all were answered. The patient agreed with the plan and demonstrated an understanding of the instructions.   The patient was advised to call back or seek an in-person evaluation if the symptoms worsen or if the condition fails to improve as anticipated.   I provided 21+ minutes of time during this encounter.  Follow up plan: Return in about 6 months (around 06/04/2020) for HTN/HLD, Mood, MGUS, CKD.

## 2019-12-06 NOTE — Assessment & Plan Note (Signed)
Chronic, ongoing.  Continue current medication regimen and adjust as needed.  Lipid panel outpatient. ?

## 2019-12-06 NOTE — Assessment & Plan Note (Signed)
Chronic, ongoing.  Continue current medication regimen and collaboration with nephrology.  CMP outpatient.  Recommend she schedule follow-up with nephrology in next month.

## 2019-12-06 NOTE — Assessment & Plan Note (Addendum)
Chronic, ongoing with office BP on review at goal. Continue current medication regimen and adjust as needed.  CMP outpatient.  Recommend she check BP at home at least 3 mornings a week and document.  Return in 6 months.

## 2019-12-10 ENCOUNTER — Other Ambulatory Visit: Payer: Self-pay | Admitting: Nurse Practitioner

## 2019-12-20 ENCOUNTER — Ambulatory Visit: Payer: Self-pay | Admitting: General Practice

## 2019-12-20 ENCOUNTER — Telehealth: Payer: Medicare Other

## 2019-12-20 NOTE — Chronic Care Management (AMB) (Signed)
°  Chronic Care Management   Outreach Note  12/20/2019 Name: Denise Everett MRN: [id number] DOB: 1951/02/05  Referred by: Venita Lick, NP Reason for referral : Chronic Care Management (Initial Outreach: Attempt #2)   An unsuccessful telephone outreach was attempted today. The patient was referred to the case management team for assistance with care management and care coordination.   Follow Up Plan: The care management team will reach out to the patient again over the next 30 days.   Noreene Larsson RN, MSN, [facility] Mobile: [phone]

## 2020-01-13 ENCOUNTER — Encounter: Payer: Self-pay | Admitting: Nurse Practitioner

## 2020-01-16 ENCOUNTER — Telehealth: Payer: Self-pay

## 2020-01-16 NOTE — Telephone Encounter (Signed)
Called patient to remind her about completing the cologuard home test. Patient stated that she has not received the kit yet. Provided the [employer] phone number so patient can call and request another kit.  

## 2020-01-25 ENCOUNTER — Telehealth: Payer: Self-pay | Admitting: Nurse Practitioner

## 2020-01-25 NOTE — Telephone Encounter (Signed)
   KNB 01/25/2020  Name: Denise Everett   MRN: [id number]   DOB: 1951/01/27   AGE: 69 y.o.   GENDER: female   PCP Venita Lick, NP.   Called pt regarding Insurance account manager for [employer] assistance. Pt stated that [employer] had paid her electric bill for the past two months. She recorded my phone number and contact information should she need in the future. Closing referral pending any other needs of patient.  [place] . [email or web address]  [phone]

## 2020-02-02 ENCOUNTER — Other Ambulatory Visit: Payer: Self-pay | Admitting: Nurse Practitioner

## 2020-02-02 NOTE — Telephone Encounter (Signed)
Requested medication (s) are due for refill today: yes  Requested medication (s) are on the active medication list: yes  Last refill:  01/02/2020  Future visit scheduled: yes  Notes to clinic:  This refill cannot be delegated   Requested Prescriptions  Pending Prescriptions Disp Refills   risperiDONE (RISPERDAL) 2 MG tablet [Pharmacy Med Name: RISPERIDONE 2 MG TAB] 30 tablet 5    Sig: TAKE 1/2 TO 1 TABLET BY MOUTH AT BEDTIME      Not Delegated - Psychiatry:  Antipsychotics - Second Generation (Atypical) - risperidone Failed - 02/02/2020  8:34 AM      Failed - This refill cannot be delegated      Failed - Prolactin Level (serum) in normal range and within 180 days    No results found for: PROLACTIN, TOTPROLACTIN, LABPROL        Failed - ALT in normal range and within 180 days    ALT  Date Value Ref Range Status  06/06/2019 5 0 - 32 IU/L Final          Failed - AST in normal range and within 180 days    AST  Date Value Ref Range Status  06/06/2019 11 0 - 40 IU/L Final          Passed - Valid encounter within last 6 months    Recent Outpatient Visits           1 month ago Essential hypertension   [place], Jolene T, NP   8 months ago Schizophrenia, unspecified type ([place])   [place], Barbaraann Faster, NP   1 year ago Schizophrenia, unspecified type ([place])   [place], Barbaraann Faster, NP   1 year ago Need for influenza vaccination   [place] [name], NP   1 year ago Essential hypertension   [place] [name], NP       Future Appointments             In 4 months [place], Barbaraann Faster, NP [facility] [facility]   In 9 months  [facility]

## 2020-02-02 NOTE — Telephone Encounter (Signed)
Patient last seen 12/06/19 and has appointment 06/18/20

## 2020-02-03 ENCOUNTER — Telehealth: Payer: Medicare Other

## 2020-02-10 ENCOUNTER — Ambulatory Visit: Payer: Self-pay | Admitting: *Deleted

## 2020-02-10 NOTE — Telephone Encounter (Signed)
Pt's sister in law called with pt having an elevated b/p, confusion and just not acting right. She stated the patient has unsteady hands, facial drooping. She knows her birthday and the president. She also stated the pt's b/p was over 200 but could not remember exactly what it was.  She had driven her to [facility], but found it closed. She is advised to take her to the hospital to r/o possible stroke. Pt got very hostile, stating she did not want to be taken to the hospital and if she was dying, she did not want to die there. Reason for Disposition . Very strange or paranoid behavior  Answer Assessment - Initial Assessment Questions 1. LEVEL OF CONSCIOUSNESS: "How is he (she, the patient) acting right now?" (e.g., alert-oriented, confused, lethargic, stuporous, comatose)     Pt is alert but confuse 2. ONSET: "When did the confusion start?"  (minutes, hours, days)     today 3. PATTERN "Does this come and go, or has it been constant since it started?"  "Is it present now?"     constant 4. ALCOHOL or DRUGS: "Has he been drinking alcohol or taking any drugs?"       5. NARCOTIC MEDICATIONS: "Has he been receiving any narcotic medications?" (e.g., morphine, Vicodin)      6. CAUSE: "What do you think is causing the confusion?"      Possible stroke 7. OTHER SYMPTOMS: "Are there any other symptoms?" (e.g., difficulty breathing, headache, fever, weakness)     Hands shaking, confusion, elevated b/p  Protocols used: CONFUSION - DELIRIUM-A-AH

## 2020-02-13 NOTE — Telephone Encounter (Signed)
Noted, agree with plan for immediate ER visit.

## 2020-02-13 NOTE — Telephone Encounter (Signed)
Please call and check on patient tomorrow, I looked under Care Everywhere and [facility], but see no ER visit.  Double check behind me to be sure I did not miss it, but I looked.

## 2020-02-14 NOTE — Telephone Encounter (Signed)
Tried calling patient, call went straight to VM and it was not set up.  Called sister in [place] number that called in yesterday and left VM asking for her to please return my call.

## 2020-02-15 NOTE — Telephone Encounter (Signed)
Tried calling patient, no answer and VM not set up. Will try to call again later.

## 2020-02-16 LAB — FECAL OCCULT BLOOD, IMMUNOCHEMICAL: IFOBT: NEGATIVE

## 2020-02-16 NOTE — Telephone Encounter (Signed)
Called and spoke with patient. She states that she is doing fine. States she is not sick and knows what was wrong with her the other day. States she never went to the ER due to knowing what was wrong. Did want to schedule an appointment next week for her BP so we did that.

## 2020-02-16 NOTE — Telephone Encounter (Signed)
Noted, thank you [place].  I was concerned when I saw she had not been seen.

## 2020-02-22 ENCOUNTER — Encounter: Payer: Self-pay | Admitting: Nurse Practitioner

## 2020-02-22 ENCOUNTER — Other Ambulatory Visit: Payer: Self-pay

## 2020-02-22 ENCOUNTER — Ambulatory Visit (INDEPENDENT_AMBULATORY_CARE_PROVIDER_SITE_OTHER): Payer: Medicare Other | Admitting: Nurse Practitioner

## 2020-02-22 VITALS — BP 125/81 | HR 62 | Temp 98.0°F | Ht 60.24 in | Wt 132.4 lb

## 2020-02-22 NOTE — Progress Notes (Signed)
Error, patient cancelled and rescheduled.

## 2020-02-28 ENCOUNTER — Encounter: Payer: Self-pay | Admitting: Nurse Practitioner

## 2020-02-29 ENCOUNTER — Other Ambulatory Visit: Payer: Self-pay

## 2020-02-29 ENCOUNTER — Encounter: Payer: Self-pay | Admitting: Nurse Practitioner

## 2020-02-29 ENCOUNTER — Ambulatory Visit (INDEPENDENT_AMBULATORY_CARE_PROVIDER_SITE_OTHER): Payer: Medicare Other | Admitting: Nurse Practitioner

## 2020-02-29 VITALS — BP 128/68 | HR 55 | Temp 98.7°F | Wt 128.2 lb

## 2020-02-29 DIAGNOSIS — I10 Essential (primary) hypertension: Secondary | ICD-10-CM | POA: Diagnosis not present

## 2020-02-29 DIAGNOSIS — N184 Chronic kidney disease, stage 4 (severe): Secondary | ICD-10-CM | POA: Insufficient documentation

## 2020-02-29 DIAGNOSIS — F17210 Nicotine dependence, cigarettes, uncomplicated: Secondary | ICD-10-CM

## 2020-02-29 DIAGNOSIS — E7800 Pure hypercholesterolemia, unspecified: Secondary | ICD-10-CM

## 2020-02-29 DIAGNOSIS — F209 Schizophrenia, unspecified: Secondary | ICD-10-CM | POA: Diagnosis not present

## 2020-02-29 DIAGNOSIS — N1832 Chronic kidney disease, stage 3b: Secondary | ICD-10-CM | POA: Diagnosis not present

## 2020-02-29 DIAGNOSIS — N183 Chronic kidney disease, stage 3 unspecified: Secondary | ICD-10-CM | POA: Insufficient documentation

## 2020-02-29 NOTE — Assessment & Plan Note (Signed)
I have recommended complete cessation of tobacco use. I have discussed various options available for assistance with tobacco cessation including over the counter methods (Nicotine gum, patch and lozenges). We also discussed prescription options (Chantix, Nicotine Inhaler / Nasal Spray). The patient is not interested in pursuing any prescription tobacco cessation options at this time.  

## 2020-02-29 NOTE — Patient Instructions (Signed)

## 2020-02-29 NOTE — Assessment & Plan Note (Signed)
Chronic, ongoing.  Continue current medication regimen, Risperdal, and adjust as needed.  Referrals to psychiatry and psychology per patient request.  She would benefit from this due to increase stressors.  Return in 4 weeks for follow-up and weight check due to some decline noted today.

## 2020-02-29 NOTE — Assessment & Plan Note (Signed)
Chronic, ongoing.  Continue current medication regimen and adjust as needed. Lipid panel today. 

## 2020-02-29 NOTE — Assessment & Plan Note (Signed)
Chronic, ongoing with office initially elevated, but repeat at goal. Continue current medication regimen and adjust as needed.  BMP today.  Recommend she check BP at home at least 3 mornings a week and document.

## 2020-02-29 NOTE — Assessment & Plan Note (Addendum)
Chronic, ongoing.  Continue current medication regimen and collaboration with nephrology.  BMP today.  Recommend she schedule follow-up with nephrology in next month.  CCM referral to assist.
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
## 2020-03-01 LAB — BASIC METABOLIC PANEL
BUN/Creatinine Ratio: 4 — ABNORMAL LOW (ref 12–28)
BUN: 6 mg/dL — ABNORMAL LOW (ref 8–27)
CO2: 22 mmol/L (ref 20–29)
Calcium: 9 mg/dL (ref 8.7–10.3)
Chloride: 99 mmol/L (ref 96–106)
Creatinine, Ser: 1.67 mg/dL — ABNORMAL HIGH (ref 0.57–1.00)
GFR calc Af Amer: 35 mL/min/{1.73_m2} — ABNORMAL LOW (ref >59)
GFR calc non Af Amer: 31 mL/min/{1.73_m2} — ABNORMAL LOW (ref >59)
Glucose: 81 mg/dL (ref 65–99)
Potassium: 4.3 mmol/L (ref 3.5–5.2)
Sodium: 135 mmol/L (ref 134–144)

## 2020-03-01 LAB — LIPID PANEL W/O CHOL/HDL RATIO
Cholesterol, Total: 148 mg/dL (ref 100–199)
HDL: 46 mg/dL (ref >39)
LDL Chol Calc (NIH): 90 mg/dL (ref 0–99)
Triglycerides: 55 mg/dL (ref 0–149)
VLDL Cholesterol Cal: 12 mg/dL (ref 5–40)

## 2020-03-01 NOTE — Progress Notes (Signed)
Please let Denise Everett know her labs have returned.  She continues to show kidney disease, but no decline.  I do want her to make sure she follows up as soon as possible with her kidney doctor + continue Lisinopril daily for kidney protection.  Cholesterol levels remain stable, continue your daily Atorvastatin.  If any questions please let me know.  Have a good day!!

## 2020-03-06 ENCOUNTER — Telehealth: Payer: Medicare Other | Admitting: General Practice

## 2020-03-06 ENCOUNTER — Ambulatory Visit (INDEPENDENT_AMBULATORY_CARE_PROVIDER_SITE_OTHER): Payer: Medicare Other | Admitting: General Practice

## 2020-03-06 DIAGNOSIS — F209 Schizophrenia, unspecified: Secondary | ICD-10-CM

## 2020-03-06 DIAGNOSIS — N1832 Chronic kidney disease, stage 3b: Secondary | ICD-10-CM

## 2020-03-06 DIAGNOSIS — E7800 Pure hypercholesterolemia, unspecified: Secondary | ICD-10-CM

## 2020-03-06 DIAGNOSIS — I10 Essential (primary) hypertension: Secondary | ICD-10-CM

## 2020-03-06 NOTE — Patient Instructions (Signed)
Visit Information  Goals Addressed            This Visit's Progress    RNCM: Pt- "I don't know how to take my blood pressure" (pt-stated)       CARE PLAN ENTRY (see longtitudinal plan of care for additional care plan information)  Current Barriers:   Chronic Disease Management support, education, and care coordination needs related to HTN, HLD, CKD Stage 3, and Schizophrenia  Clinical Goal(s) related to HTN, HLD, CKD Stage 3, and Schizophrenia :  Over the next 120 days, patient will:   Work with the care management team to address educational, disease management, and care coordination needs   Begin or continue self health monitoring activities as directed today  work with the patient on self monitoring of blood pressure and adherence to a heart healthy diet  Call provider office for new or worsened signs and symptoms Blood pressure findings outside established parameters, Shortness of breath, and New or worsened symptom related to HLD/HTN/Kidney function/Schizophrenia and other chronic conditions  Call care management team with questions or concerns  Verbalize basic understanding of patient centered plan of care established today  Interventions related to HTN, HLD, CKD Stage 3, and Schizophrenia :   Evaluation of current treatment plans and patient's adherence to plan as established by provider.  The patient verbalized compliance with her medications regimen and recommendations by her pcp  Assessed patient understanding of disease states.  The patient said recently she did not know why she had a decreased appetite but states she is eating better now and wanted the [facility] to let the pcp know.   Assessed patient's education and care coordination needs.  The patient denies any needs at this time but knows resources are available.  Evaluation of heart healthy diet. The patient reviewed some of the things she eats. Will work with the patient on Heart Healthy options.   Provided  disease specific education to patient.  The patient denies having headaches. The patient verbalized she did not know how to take her blood pressure. Will see if can work with the patient at her next pcp visit on May 19th to see if this is something she can be taught.   Collaborated with appropriate clinical care team members regarding patient needs.  LCSW referral in place. The patient can benefit from coping skills when she feels down or has a decreased appetite. The patient is receptive to talking to the CCM team.   Patient Self Care Activities related to HTN, HLD, CKD Stage 3, and Schizophrenia :   Patient is unable to independently self-manage chronic health conditions  Initial goal documentation        Ms. Lun was given information about Chronic Care Management services today including:  1. CCM service includes personalized support from designated clinical staff supervised by her physician, including individualized plan of care and coordination with other care providers 2. 24/7 contact phone numbers for assistance for urgent and routine care needs. 3. Service will only be billed when office clinical staff spend 20 minutes or more in a month to coordinate care. 4. Only one practitioner may furnish and bill the service in a calendar month. 5. The patient may stop CCM services at any time (effective at the end of the month) by phone call to the office staff. 6. The patient will be responsible for cost sharing (co-pay) of up to 20% of the service fee (after annual deductible is met).  Patient agreed to services and verbal  consent obtained.   Print copy of patient instructions provided.   Face to Face appointment with care management team member scheduled for: Mar 28, 2020 at 1:30pm  [place], MSN, [facility] Mobile: [phone]

## 2020-03-06 NOTE — Chronic Care Management (AMB) (Signed)
Chronic Care Management   Initial Visit Note  03/06/2020 Name: Denise Everett MRN: [id number] DOB: Apr 03, 1951  Referred by: Venita Lick, NP Reason for referral : Chronic Care Management (Initial Outreach: RNCM Chronic Disease Management and Care Coordination)   Denise Everett is a 69 y.o. year old female who is a primary care patient of Cannady, Barbaraann Faster, NP. The CCM team was consulted for assistance with chronic disease management and care coordination needs related to HTN, HLD, CKD Stage 3 and Schizophrenia  Review of patient status, including review of consultants reports, relevant laboratory and other test results, and collaboration with appropriate care team members and the patient's provider was performed as part of comprehensive patient evaluation and provision of chronic care management services.    SDOH (Social Determinants of Health) assessments performed: Yes See Care Plan activities for detailed interventions related to SDOH  SDOH Interventions     Most Recent Value  SDOH Interventions  SDOH Interventions for the Following Domains  Social Connections  Social Connections Interventions  Other (Comment) [has a good family support system. Denies issues with social connections]       Medications: Outpatient Encounter Medications as of 03/06/2020  Medication Sig  . Aspirin-Acetaminophen-Caffeine (GOODYS EXTRA STRENGTH) [phone] MG PACK Take by mouth.  Marland Kitchen atorvastatin (LIPITOR) 10 MG tablet TAKE 1 TABLET BY MOUTH DAILY AT 6 PM  . gabapentin (NEURONTIN) 300 MG capsule TAKE 1 CAPSULE BY MOUTH 3 TIMES DAILY  . lisinopril (ZESTRIL) 10 MG tablet TAKE ONE-HALF TABLET BY MOUTH DAILY  . raloxifene (EVISTA) 60 MG tablet TAKE 1 TABLET BY MOUTH EVERY DAY  . risperiDONE (RISPERDAL) 2 MG tablet TAKE 1/2 TO 1 TABLET BY MOUTH AT BEDTIME   No facility-administered encounter medications on file as of 03/06/2020.     Objective:   Goals Addressed            This Visit's Progress     . RNCM: Pt- "I don't know how to take my blood pressure" (pt-stated)       CARE PLAN ENTRY (see longtitudinal plan of care for additional care plan information)  Current Barriers:  . Chronic Disease Management support, education, and care coordination needs related to HTN, HLD, CKD Stage 3, and Schizophrenia  Clinical Goal(s) related to HTN, HLD, CKD Stage 3, and Schizophrenia :  Over the next 120 days, patient will:  . Work with the care management team to address educational, disease management, and care coordination needs  . Begin or continue self health monitoring activities as directed today  work with the patient on self monitoring of blood pressure and adherence to a heart healthy diet . Call provider office for new or worsened signs and symptoms Blood pressure findings outside established parameters, Shortness of breath, and New or worsened symptom related to HLD/HTN/Kidney function/Schizophrenia and other chronic conditions . Call care management team with questions or concerns . Verbalize basic understanding of patient centered plan of care established today  Interventions related to HTN, HLD, CKD Stage 3, and Schizophrenia :  . Evaluation of current treatment plans and patient's adherence to plan as established by provider.  The patient verbalized compliance with her medications regimen and recommendations by her pcp . Assessed patient understanding of disease states.  The patient said recently she did not know why she had a decreased appetite but states she is eating better now and wanted the [facility] to let the pcp know.  . Assessed patient's education and care coordination needs.  The  patient denies any needs at this time but knows resources are available. . Evaluation of heart healthy diet. The patient reviewed some of the things she eats. Will work with the patient on Heart Healthy options.  . Provided disease specific education to patient.  The patient denies having headaches. The  patient verbalized she did not know how to take her blood pressure. Will see if can work with the patient at her next pcp visit on May 19th to see if this is something she can be taught.  Nash Dimmer with appropriate clinical care team members regarding patient needs.  LCSW referral in place. The patient can benefit from coping skills when she feels down or has a decreased appetite. The patient is receptive to talking to the CCM team.   Patient Self Care Activities related to HTN, HLD, CKD Stage 3, and Schizophrenia :  . Patient is unable to independently self-manage chronic health conditions  Initial goal documentation         Denise Everett was given information about Chronic Care Management services today including:  1. CCM service includes personalized support from designated clinical staff supervised by her physician, including individualized plan of care and coordination with other care providers 2. 24/7 contact phone numbers for assistance for urgent and routine care needs. 3. Service will only be billed when office clinical staff spend 20 minutes or more in a month to coordinate care. 4. Only one practitioner may furnish and bill the service in a calendar month. 5. The patient may stop CCM services at any time (effective at the end of the month) by phone call to the office staff. 6. The patient will be responsible for cost sharing (co-pay) of up to 20% of the service fee (after annual deductible is met).  Patient agreed to services and verbal consent obtained.   Plan:   Face to Face appointment with care management team member scheduled for: Mar 28, 2020 at 1:30  [place], MSN, [facility] Mobile: [phone]

## 2020-03-28 ENCOUNTER — Ambulatory Visit (INDEPENDENT_AMBULATORY_CARE_PROVIDER_SITE_OTHER): Payer: Medicare Other | Admitting: General Practice

## 2020-03-28 ENCOUNTER — Other Ambulatory Visit: Payer: Self-pay

## 2020-03-28 ENCOUNTER — Ambulatory Visit (INDEPENDENT_AMBULATORY_CARE_PROVIDER_SITE_OTHER): Payer: Medicare Other | Admitting: Nurse Practitioner

## 2020-03-28 ENCOUNTER — Encounter: Payer: Self-pay | Admitting: Nurse Practitioner

## 2020-03-28 ENCOUNTER — Ambulatory Visit: Payer: Self-pay | Admitting: General Practice

## 2020-03-28 VITALS — BP 103/67 | HR 85 | Temp 97.9°F | Ht 59.84 in | Wt 127.2 lb

## 2020-03-28 DIAGNOSIS — R630 Anorexia: Secondary | ICD-10-CM | POA: Diagnosis not present

## 2020-03-28 DIAGNOSIS — D693 Immune thrombocytopenic purpura: Secondary | ICD-10-CM

## 2020-03-28 DIAGNOSIS — E7800 Pure hypercholesterolemia, unspecified: Secondary | ICD-10-CM

## 2020-03-28 DIAGNOSIS — F209 Schizophrenia, unspecified: Secondary | ICD-10-CM

## 2020-03-28 DIAGNOSIS — Z87891 Personal history of nicotine dependence: Secondary | ICD-10-CM

## 2020-03-28 DIAGNOSIS — I10 Essential (primary) hypertension: Secondary | ICD-10-CM | POA: Diagnosis not present

## 2020-03-28 DIAGNOSIS — D472 Monoclonal gammopathy: Secondary | ICD-10-CM | POA: Diagnosis not present

## 2020-03-28 DIAGNOSIS — R634 Abnormal weight loss: Secondary | ICD-10-CM | POA: Diagnosis not present

## 2020-03-28 DIAGNOSIS — N1832 Chronic kidney disease, stage 3b: Secondary | ICD-10-CM

## 2020-03-28 DIAGNOSIS — W19XXXA Unspecified fall, initial encounter: Secondary | ICD-10-CM

## 2020-03-28 MED ORDER — FAMOTIDINE 20 MG PO TABS
20.0000 mg | ORAL_TABLET | Freq: Every day | ORAL | 3 refills | Status: DC
Start: 2020-03-28 — End: 2021-01-14

## 2020-03-28 NOTE — Assessment & Plan Note (Signed)
Discussed at length with patient need for consistent follow-up with hematology, referral placed to return for visit, especially in presence of recent mild weight loss and decreased appetite.  Continue to collaborate with CCM team on transportation.  CBC today.

## 2020-03-28 NOTE — Assessment & Plan Note (Signed)
Continue to collaborate with hematology, recommend she ensure she schedule follow-up with them and new referral placed to return.  Will obtain CBC today, suspect PLT will remain on lower side.  She denies bleeding or increased bruising.

## 2020-03-28 NOTE — Patient Instructions (Signed)
Visit Information  Goals Addressed            This Visit's Progress   . RNCM: Pt- "I don't know how to take my blood pressure" (pt-stated)       CARE PLAN ENTRY (see longtitudinal plan of care for additional care plan information)  Current Barriers:  . Chronic Disease Management support, education, and care coordination needs related to HTN, HLD, CKD Stage 3, and Schizophrenia  Clinical Goal(s) related to HTN, HLD, CKD Stage 3, and Schizophrenia :  Over the next 120 days, patient will:  . Work with the care management team to address educational, disease management, and care coordination needs  . Begin or continue self health monitoring activities as directed today  work with the patient on self monitoring of blood pressure and adherence to a heart healthy diet . Call provider office for new or worsened signs and symptoms Blood pressure findings outside established parameters, Shortness of breath, and New or worsened symptom related to HLD/HTN/Kidney function/Schizophrenia and other chronic conditions . Call care management team with questions or concerns . Verbalize basic understanding of patient centered plan of care established today  Interventions related to HTN, HLD, CKD Stage 3, and Schizophrenia :  . Evaluation of current treatment plans and patient's adherence to plan as established by provider.  The patient verbalized compliance with her medications regimen and recommendations by her pcp . Assessed patient understanding of disease states.  The patient said recently she did not know why she had a decreased appetite but states she is eating better now and wanted the [facility] to let the pcp know. States she only eats one meal a day and sometimes has a snack.  Eats out some and cooks some.  . Assessed patient's education and care coordination needs.  The patient denies any needs at this time but knows resources are available. . Evaluation of heart healthy diet. The patient reviewed some  of the things she eats. Will work with the patient on Heart Healthy options. Does not use sodium. Likes to season with pepper.  . Provided disease specific education to patient.  The patient denies having headaches. The patient verbalized she did not know how to take her blood pressure. Will see if can work with the patient at her next pcp visit on May 19th to see if this is something she can be taught. The patient has a stable blood pressure at this time. Nash Dimmer with appropriate clinical care team members regarding patient needs.  LCSW referral in place. The patient can benefit from coping skills when she feels down or has a decreased appetite. The patient is receptive to talking to the CCM team.   Patient Self Care Activities related to HTN, HLD, CKD Stage 3, and Schizophrenia :  . Patient is unable to independently self-manage chronic health conditions  Please see past updates related to this goal by clicking on the "Past Updates" button in the selected goal      . RNCM: Pt-"I had a fall today" (pt-stated)       CARE PLAN ENTRY (see longitudinal plan of care for additional care plan information)  Current Barriers:  Marland Kitchen Knowledge Deficits related to fall precautions . Decreased adherence to prescribed treatment for fall prevention . Literacy barriers  Clinical Goal(s):  Marland Kitchen Over the next 120 days, patient will demonstrate improved adherence to prescribed treatment plan for decreasing falls as evidenced by patient reporting and review of EMR . Over the next 120 days, patient  will verbalize using fall risk reduction strategies discussed . Over the next 120 days, patient will not experience additional falls . Over the next 120 days, patient will work with [facility] and pcp to address needs related to fall risk . Over the next 120 days, patient will attend all scheduled medical appointments: appointment with pcp 03-28-2020.  Interventions:  . Provided written and verbal education re: Potential  causes of falls and Fall prevention strategies . Reviewed medications and discussed potential side effects of medications such as dizziness and frequent urination . Assessed for s/s of orthostatic hypotension . Assessed for falls since last encounter. . Assessed patients knowledge of fall risk prevention secondary to previously provided education. . Assessed working status of life alert bracelet and patient adherence . Provided patient information for fall alert systems . Evaluation of current treatment plan related to falls and patient's adherence to plan as established by provider. . Advised patient to call the pcp for new falls . Provided education to patient re: safety in the home to prevent falls  Patient Self Care Activities:  . Utilize safety when ambulating.  The patient does not use DME(assistive device) appropriately with all ambulation . De-clutter walkways . Change positions slowly . Wear secure fitting shoes at all times with ambulation . Utilize home lighting for dim lit areas . Have self and pet awareness at all times  Plan: . CCM RN CM will follow up in 60   Initial goal documentation        Patient verbalizes understanding of instructions provided today.   The care management team will reach out to the patient again over the next 60 days.   Noreene Larsson RN, MSN, [facility] Mobile: [phone]

## 2020-03-28 NOTE — Assessment & Plan Note (Signed)
With some mild weight loss present, 10 pounds past year and 5 pounds since April 14th.  Has diagnosis of underlying MGUS and ITP, will place referral for her to return to hematology as has missed several follow-up with them, discussed at length with patient need to maintain follow-up visits. Labs today to include CBC, LFT, TSH.  Script for Pepcid sent for reflux.  Will place referral to GI due to declining appetite and weight loss with history of smoking -- had negative iFOBT, but may benefit from colonoscopy.  Appreciate their input.  Return in 4 weeks.

## 2020-03-28 NOTE — Assessment & Plan Note (Signed)
Referral for lung CT screening, missed follow-up screening.

## 2020-03-28 NOTE — Assessment & Plan Note (Signed)
Chronic, ongoing.  Continue current medication regimen, Risperdal, and adjust as needed.  Referrals to psychiatry and psychology per patient request, last visit, followed up with referral team on these today and they are reaching out to psychiatry.  She would benefit from this due to increase stressors with family recently.

## 2020-03-28 NOTE — Patient Instructions (Signed)
DASH Eating Plan DASH stands for "Dietary Approaches to Stop Hypertension." The DASH eating plan is a healthy eating plan that has been shown to reduce high blood pressure (hypertension). It may also reduce your risk for type 2 diabetes, heart disease, and stroke. The DASH eating plan may also help with weight loss. What are tips for following this plan?  General guidelines  Avoid eating more than 2,300 mg (milligrams) of salt (sodium) a day. If you have hypertension, you may need to reduce your sodium intake to 1,500 mg a day.  Limit alcohol intake to no more than 1 drink a day for nonpregnant women and 2 drinks a day for men. One drink equals 12 oz of beer, 5 oz of wine, or 1 oz of hard liquor.  Work with your health care provider to maintain a healthy body weight or to lose weight. Ask what an ideal weight is for you.  Get at least 30 minutes of exercise that causes your heart to beat faster (aerobic exercise) most days of the week. Activities may include walking, swimming, or biking.  Work with your health care provider or diet and nutrition specialist (dietitian) to adjust your eating plan to your individual calorie needs. Reading food labels   Check food labels for the amount of sodium per serving. Choose foods with less than 5 percent of the Daily Value of sodium. Generally, foods with less than 300 mg of sodium per serving fit into this eating plan.  To find whole grains, look for the word "whole" as the first word in the ingredient list. Shopping  Buy products labeled as "low-sodium" or "no salt added."  Buy fresh foods. Avoid canned foods and premade or frozen meals. Cooking  Avoid adding salt when cooking. Use salt-free seasonings or herbs instead of table salt or sea salt. Check with your health care provider or pharmacist before using salt substitutes.  Do not fry foods. Cook foods using healthy methods such as baking, boiling, grilling, and broiling instead.  Cook with  heart-healthy oils, such as olive, canola, soybean, or sunflower oil. Meal planning  Eat a balanced diet that includes: ? 5 or more servings of fruits and vegetables each day. At each meal, try to fill half of your plate with fruits and vegetables. ? Up to 6-8 servings of whole grains each day. ? Less than 6 oz of lean meat, poultry, or fish each day. A 3-oz serving of meat is about the same size as a deck of cards. One egg equals 1 oz. ? 2 servings of low-fat dairy each day. ? A serving of nuts, seeds, or beans 5 times each week. ? Heart-healthy fats. Healthy fats called Omega-3 fatty acids are found in foods such as flaxseeds and coldwater fish, like sardines, salmon, and mackerel.  Limit how much you eat of the following: ? Canned or prepackaged foods. ? Food that is high in trans fat, such as fried foods. ? Food that is high in saturated fat, such as fatty meat. ? Sweets, desserts, sugary drinks, and other foods with added sugar. ? Full-fat dairy products.  Do not salt foods before eating.  Try to eat at least 2 vegetarian meals each week.  Eat more home-cooked food and less restaurant, buffet, and fast food.  When eating at a restaurant, ask that your food be prepared with less salt or no salt, if possible. What foods are recommended? The items listed may not be a complete list. Talk with your dietitian about   what dietary choices are best for you. Grains Whole-grain or whole-wheat bread. Whole-grain or whole-wheat pasta. Brown rice. Oatmeal. Quinoa. Bulgur. Whole-grain and low-sodium cereals. Pita bread. Low-fat, low-sodium crackers. Whole-wheat flour tortillas. Vegetables Fresh or frozen vegetables (raw, steamed, roasted, or grilled). Low-sodium or reduced-sodium tomato and vegetable juice. Low-sodium or reduced-sodium tomato sauce and tomato paste. Low-sodium or reduced-sodium canned vegetables. Fruits All fresh, dried, or frozen fruit. Canned fruit in natural juice (without  added sugar). Meat and other protein foods Skinless chicken or turkey. Ground chicken or turkey. Pork with fat trimmed off. Fish and seafood. Egg whites. Dried beans, peas, or lentils. Unsalted nuts, nut butters, and seeds. Unsalted canned beans. Lean cuts of beef with fat trimmed off. Low-sodium, lean deli meat. Dairy Low-fat (1%) or fat-free (skim) milk. Fat-free, low-fat, or reduced-fat cheeses. Nonfat, low-sodium ricotta or cottage cheese. Low-fat or nonfat yogurt. Low-fat, low-sodium cheese. Fats and oils Soft margarine without trans fats. Vegetable oil. Low-fat, reduced-fat, or light mayonnaise and salad dressings (reduced-sodium). Canola, safflower, olive, soybean, and sunflower oils. Avocado. Seasoning and other foods Herbs. Spices. Seasoning mixes without salt. Unsalted popcorn and pretzels. Fat-free sweets. What foods are not recommended? The items listed may not be a complete list. Talk with your dietitian about what dietary choices are best for you. Grains Baked goods made with fat, such as croissants, muffins, or some breads. Dry pasta or rice meal packs. Vegetables Creamed or fried vegetables. Vegetables in a cheese sauce. Regular canned vegetables (not low-sodium or reduced-sodium). Regular canned tomato sauce and paste (not low-sodium or reduced-sodium). Regular tomato and vegetable juice (not low-sodium or reduced-sodium). Pickles. Olives. Fruits Canned fruit in a light or heavy syrup. Fried fruit. Fruit in cream or butter sauce. Meat and other protein foods Fatty cuts of meat. Ribs. Fried meat. Bacon. Sausage. Bologna and other processed lunch meats. Salami. Fatback. Hotdogs. Bratwurst. Salted nuts and seeds. Canned beans with added salt. Canned or smoked fish. Whole eggs or egg yolks. Chicken or turkey with skin. Dairy Whole or 2% milk, cream, and half-and-half. Whole or full-fat cream cheese. Whole-fat or sweetened yogurt. Full-fat cheese. Nondairy creamers. Whipped toppings.  Processed cheese and cheese spreads. Fats and oils Butter. Stick margarine. Lard. Shortening. Ghee. Bacon fat. Tropical oils, such as coconut, palm kernel, or palm oil. Seasoning and other foods Salted popcorn and pretzels. Onion salt, garlic salt, seasoned salt, table salt, and sea salt. Worcestershire sauce. Tartar sauce. Barbecue sauce. Teriyaki sauce. Soy sauce, including reduced-sodium. Steak sauce. Canned and packaged gravies. Fish sauce. Oyster sauce. Cocktail sauce. Horseradish that you find on the shelf. Ketchup. Mustard. Meat flavorings and tenderizers. Bouillon cubes. Hot sauce and Tabasco sauce. Premade or packaged marinades. Premade or packaged taco seasonings. Relishes. Regular salad dressings. Where to find more information:  National Heart, Lung, and Blood Institute: www.nhlbi.nih.gov  American Heart Association: www.heart.org Summary  The DASH eating plan is a healthy eating plan that has been shown to reduce high blood pressure (hypertension). It may also reduce your risk for type 2 diabetes, heart disease, and stroke.  With the DASH eating plan, you should limit salt (sodium) intake to 2,300 mg a day. If you have hypertension, you may need to reduce your sodium intake to 1,500 mg a day.  When on the DASH eating plan, aim to eat more fresh fruits and vegetables, whole grains, lean proteins, low-fat dairy, and heart-healthy fats.  Work with your health care provider or diet and nutrition specialist (dietitian) to adjust your eating plan to your   individual calorie needs. This information is not intended to replace advice given to you by your health care provider. Make sure you discuss any questions you have with your health care provider. Document Revised: 10/09/2017 Document Reviewed: 10/20/2016 Elsevier Patient Education  2020 Elsevier Inc.  

## 2020-03-28 NOTE — Chronic Care Management (AMB) (Signed)
Chronic Care Management   Follow Up Note   03/28/2020 Name: Denise Everett MRN: [id number] DOB: [birth year]  Referred by: Venita Lick, NP Reason for referral : Chronic Care Management (Follow Up:HLD/HTN/CKD/Schizophrenia)   Denise Everett is a 69 y.o. year old female who is a primary care patient of Cannady, Barbaraann Faster, NP. The CCM team was consulted for assistance with chronic disease management and care coordination needs.    Review of patient status, including review of consultants reports, relevant laboratory and other test results, and collaboration with appropriate care team members and the patient's provider was performed as part of comprehensive patient evaluation and provision of chronic care management services.    SDOH (Social Determinants of Health) assessments performed: Yes See Care Plan activities for detailed interventions related to [facility])     Outpatient Encounter Medications as of 03/28/2020  Medication Sig  . Aspirin-Acetaminophen-Caffeine (GOODYS EXTRA STRENGTH) [phone] MG PACK Take by mouth.  Marland Kitchen atorvastatin (LIPITOR) 10 MG tablet TAKE 1 TABLET BY MOUTH DAILY AT 6 PM  . gabapentin (NEURONTIN) 300 MG capsule TAKE 1 CAPSULE BY MOUTH 3 TIMES DAILY  . lisinopril (ZESTRIL) 10 MG tablet TAKE ONE-HALF TABLET BY MOUTH DAILY  . raloxifene (EVISTA) 60 MG tablet TAKE 1 TABLET BY MOUTH EVERY DAY  . risperiDONE (RISPERDAL) 2 MG tablet TAKE 1/2 TO 1 TABLET BY MOUTH AT BEDTIME   No facility-administered encounter medications on file as of 03/28/2020.     Objective:  BP Readings from Last 3 Encounters:  03/28/20 103/67  02/29/20 128/68  02/22/20 125/81    Goals Addressed            This Visit's Progress   . RNCM: Pt- "I don't know how to take my blood pressure" (pt-stated)       CARE PLAN ENTRY (see longtitudinal plan of care for additional care plan information)  Current Barriers:  . Chronic Disease Management support, education, and care coordination needs  related to HTN, HLD, CKD Stage 3, and Schizophrenia  Clinical Goal(s) related to HTN, HLD, CKD Stage 3, and Schizophrenia :  Over the next 120 days, patient will:  . Work with the care management team to address educational, disease management, and care coordination needs  . Begin or continue self health monitoring activities as directed today  work with the patient on self monitoring of blood pressure and adherence to a heart healthy diet . Call provider office for new or worsened signs and symptoms Blood pressure findings outside established parameters, Shortness of breath, and New or worsened symptom related to HLD/HTN/Kidney function/Schizophrenia and other chronic conditions . Call care management team with questions or concerns . Verbalize basic understanding of patient centered plan of care established today  Interventions related to HTN, HLD, CKD Stage 3, and Schizophrenia :  . Evaluation of current treatment plans and patient's adherence to plan as established by provider.  The patient verbalized compliance with her medications regimen and recommendations by her pcp . Assessed patient understanding of disease states.  The patient said recently she did not know why she had a decreased appetite but states she is eating better now and wanted the [facility] to let the pcp know. States she only eats one meal a day and sometimes has a snack.  Eats out some and cooks some.  . Assessed patient's education and care coordination needs.  The patient denies any needs at this time but knows resources are available. . Evaluation of heart healthy diet. The patient reviewed  some of the things she eats. Will work with the patient on Heart Healthy options. Does not use sodium. Likes to season with pepper.  . Provided disease specific education to patient.  The patient denies having headaches. The patient verbalized she did not know how to take her blood pressure. Will see if can work with the patient at her next pcp  visit on May 19th to see if this is something she can be taught. The patient has a stable blood pressure at this time. Nash Dimmer with appropriate clinical care team members regarding patient needs.  LCSW referral in place. The patient can benefit from coping skills when she feels down or has a decreased appetite. The patient is receptive to talking to the CCM team.   Patient Self Care Activities related to HTN, HLD, CKD Stage 3, and Schizophrenia :  . Patient is unable to independently self-manage chronic health conditions  Please see past updates related to this goal by clicking on the "Past Updates" button in the selected goal      . RNCM: Pt-"I had a fall today" (pt-stated)       CARE PLAN ENTRY (see longitudinal plan of care for additional care plan information)  Current Barriers:  Marland Kitchen Knowledge Deficits related to fall precautions . Decreased adherence to prescribed treatment for fall prevention . Literacy barriers  Clinical Goal(s):  Marland Kitchen Over the next 120 days, patient will demonstrate improved adherence to prescribed treatment plan for decreasing falls as evidenced by patient reporting and review of EMR . Over the next 120 days, patient will verbalize using fall risk reduction strategies discussed . Over the next 120 days, patient will not experience additional falls . Over the next 120 days, patient will work with [facility] and pcp to address needs related to fall risk . Over the next 120 days, patient will attend all scheduled medical appointments: appointment with pcp 03-28-2020.  Interventions:  . Provided written and verbal education re: Potential causes of falls and Fall prevention strategies . Reviewed medications and discussed potential side effects of medications such as dizziness and frequent urination . Assessed for s/s of orthostatic hypotension . Assessed for falls since last encounter. . Assessed patients knowledge of fall risk prevention secondary to previously provided  education. . Assessed working status of life alert bracelet and patient adherence . Provided patient information for fall alert systems . Evaluation of current treatment plan related to falls and patient's adherence to plan as established by provider. . Advised patient to call the pcp for new falls . Provided education to patient re: safety in the home to prevent falls  Patient Self Care Activities:  . Utilize safety when ambulating.  The patient does not use DME(assistive device) appropriately with all ambulation . De-clutter walkways . Change positions slowly . Wear secure fitting shoes at all times with ambulation . Utilize home lighting for dim lit areas . Have self and pet awareness at all times  Plan: . CCM RN CM will follow up in 60   Initial goal documentation         Plan:   The care management team will reach out to the patient again over the next 60 days.   Noreene Larsson RN, MSN, [facility] Mobile: [phone]

## 2020-03-28 NOTE — Progress Notes (Signed)
BP 103/67 (BP Location: Left Arm, Patient Position: Sitting, Cuff Size: Normal)   Pulse 85   Temp 97.9 F (36.6 C) (Oral)   Ht 4' 11.84" (1.52 m)   Wt 127 lb 3.2 oz (57.7 kg)   LMP  (LMP Unknown)   SpO2 98%   BMI 24.97 kg/m    Subjective:    Patient ID: Denise Everett, female    DOB: 10-23-1951, 69 y.o.   MRN: [id number]  HPI: Denise Everett is a 69 y.o. female  Chief Complaint  Patient presents with  . Schizophrenia  . Weight Check   SCHIZOPHRENIA: Reports this is well-controlled with her medication, but would like to return to psychiatry and therapy due to recent family stressors. Prior to medication she had auditory hallucinations. Diagnosed about 20 years ago. Denies any behaviors or auditory hallucinations. Referral placed last visit to psychiatry and therapy, but has never heard from anyone.   Depression screen [facility] 2/9 03/28/2020 03/06/2020 11/16/2019 11/11/2018 09/16/2017  Decreased Interest 0 0 0 0 1  Down, Depressed, Hopeless 0 0 0 0 0  PHQ - 2 Score 0 0 0 0 1  Altered sleeping 0 - - - 0  Tired, decreased energy 0 - - - 1  Change in appetite 0 - - - 0  Feeling bad or failure about yourself  0 - - - 0  Trouble concentrating 0 - - - 0  Moving slowly or fidgety/restless 0 - - - 0  Suicidal thoughts 0 - - - 0  PHQ-9 Score 0 - - - 2  Difficult doing work/chores Not difficult at all - - - Not difficult at all   GAD 7 : Generalized Anxiety Score 03/28/2020  Nervous, Anxious, on Edge 0  Control/stop worrying 0  Worry too much - different things 0  Trouble relaxing 0  Restless 0  Easily annoyed or irritable 0  Afraid - awful might happen 0  Total GAD 7 Score 0  Anxiety Difficulty Not difficult at all     WEIGHT LOSS: Has had 5 pound loss since April 14th (10 pound loss since 01/28/19), reports she feels hungry, but can not get food to go down.  Reports food she eats is filling her up quickly.  Currently eating one meal a day at lunchtime, usually will eat chicken,  cream potato salad, fish, peas.  States any kind of meat does not go down well, does not feel like meat gets stuck in throat, but feels like meat sits in stomach.  Does endorse occasional heart burn, "bad", especially if eats hot sauce which she loves on her fish.  Has not had colonoscopy, reports she was told due to not having help to pick her up she could not go.  Denies SOB or CP.  History of smoking, started at 13 and quit 4 years ago.  Had initial lung CT screening in 2018 which showed emphysema and was to repeat in 12 months, missed screening.  IFOBT negative 02/16/20.  Followed by Dr. Grayland Ormond for MGUS and ITP, but has not seen him since 11/24/17 -- last CBC in March 2020 showed PLT 62 and she was to follow-up with oncology, but did not.  Denies fatigue, night sweats, or fever. Fever: no Nausea: no Vomiting: reports two episodes of vomiting x 2 weeks ago Weight loss: yes Decreased appetite: yes Diarrhea: no Constipation: no Blood in stool: no Heartburn: yes Jaundice: no Rash: no Dysuria/urinary frequency: no Hematuria: no History of sexually transmitted disease:  no Recurrent NSAID use: no  Relevant past medical, surgical, family and social history reviewed and updated as indicated. Interim medical history since our last visit reviewed. Allergies and medications reviewed and updated.  Review of Systems  Constitutional: Positive for appetite change and unexpected weight change. Negative for activity change, diaphoresis, fatigue and fever.  Respiratory: Negative for cough, chest tightness and shortness of breath.   Cardiovascular: Negative for chest pain, palpitations and leg swelling.  Gastrointestinal: Positive for vomiting (x 2 weeks ago, none recently). Negative for abdominal distention, abdominal pain, blood in stool, constipation, diarrhea and nausea.  Neurological: Negative.   Psychiatric/Behavioral: Negative for decreased concentration, self-injury, sleep disturbance and suicidal  ideas. The patient is not nervous/anxious.     Per HPI unless specifically indicated above     Objective:    BP 103/67 (BP Location: Left Arm, Patient Position: Sitting, Cuff Size: Normal)   Pulse 85   Temp 97.9 F (36.6 C) (Oral)   Ht 4' 11.84" (1.52 m)   Wt 127 lb 3.2 oz (57.7 kg)   LMP  (LMP Unknown)   SpO2 98%   BMI 24.97 kg/m   Wt Readings from Last 3 Encounters:  03/28/20 127 lb 3.2 oz (57.7 kg)  02/29/20 128 lb 3.2 oz (58.2 kg)  02/22/20 132 lb 6.4 oz (60.1 kg)    Physical Exam Vitals and nursing note reviewed.  Constitutional:      General: She is awake. She is not in acute distress.    Appearance: She is well-developed and well-groomed. She is not ill-appearing.  HENT:     Head: Normocephalic.     Right Ear: Hearing normal.     Left Ear: Hearing normal.     Nose: Nose normal.     Mouth/Throat:     Mouth: Mucous membranes are moist.  Eyes:     General: Lids are normal.        Right eye: No discharge.        Left eye: No discharge.     Conjunctiva/sclera: Conjunctivae normal.     Pupils: Pupils are equal, round, and reactive to light.  Neck:     Thyroid: No thyromegaly.     Vascular: No carotid bruit.  Cardiovascular:     Rate and Rhythm: Normal rate and regular rhythm.     Heart sounds: Normal heart sounds. No murmur. No gallop.   Pulmonary:     Effort: Pulmonary effort is normal. No accessory muscle usage or respiratory distress.     Breath sounds: Normal breath sounds.  Abdominal:     General: Bowel sounds are normal. There is no distension or abdominal bruit.     Palpations: Abdomen is soft. There is no hepatomegaly or splenomegaly.     Tenderness: There is no abdominal tenderness.     Hernia: No hernia is present.  Musculoskeletal:     Cervical back: Normal range of motion and neck supple.     Right lower leg: No edema.     Left lower leg: No edema.  Lymphadenopathy:     Head:     Right side of head: No submental, submandibular, tonsillar,  preauricular or posterior auricular adenopathy.     Left side of head: No submental, submandibular, tonsillar, preauricular or posterior auricular adenopathy.     Cervical: No cervical adenopathy.  Skin:    General: Skin is warm and dry.  Neurological:     Mental Status: She is alert and oriented to person, place, and time.  Psychiatric:        Attention and Perception: Attention normal.        Mood and Affect: Mood normal.        Speech: Speech normal.        Behavior: Behavior normal. Behavior is cooperative.    Results for orders placed or performed in visit on 30/07/62  Basic metabolic panel  Result Value Ref Range   Glucose 81 65 - 99 mg/dL   BUN 6 (L) 8 - 27 mg/dL   Creatinine, Ser 1.67 (H) 0.57 - 1.00 mg/dL   GFR calc non Af Amer 31 (L) >59 mL/min/1.73   GFR calc Af Amer 35 (L) >59 mL/min/1.73   BUN/Creatinine Ratio 4 (L) 12 - 28   Sodium 135 134 - 144 mmol/L   Potassium 4.3 3.5 - 5.2 mmol/L   Chloride 99 96 - 106 mmol/L   CO2 22 20 - 29 mmol/L   Calcium 9.0 8.7 - 10.3 mg/dL  Lipid Panel w/o Chol/HDL Ratio  Result Value Ref Range   Cholesterol, Total 148 100 - 199 mg/dL   Triglycerides 55 0 - 149 mg/dL   HDL 46 >39 mg/dL   VLDL Cholesterol Cal 12 5 - 40 mg/dL   LDL Chol Calc (NIH) 90 0 - 99 mg/dL      Assessment & Plan:   Problem List Items Addressed This Visit      Musculoskeletal and Integument   Idiopathic thrombocytopenic purpura (HCC) (Chronic)    Continue to collaborate with hematology, recommend she ensure she schedule follow-up with them and new referral placed to return.  Will obtain CBC today, suspect PLT will remain on lower side.  She denies bleeding or increased bruising.      Relevant Orders   Ambulatory referral to Hematology / Oncology     Other   Schizophrenia [facility]) - Primary    Chronic, ongoing.  Continue current medication regimen, Risperdal, and adjust as needed.  Referrals to psychiatry and psychology per patient request, last visit,  followed up with referral team on these today and they are reaching out to psychiatry.  She would benefit from this due to increase stressors with family recently.      History of smoking    Referral to return to lung CT screening placed.      MGUS (monoclonal gammopathy of unknown significance)    Discussed at length with patient need for consistent follow-up with hematology, referral placed to return for visit, especially in presence of recent mild weight loss and decreased appetite.  Continue to collaborate with CCM team on transportation.  CBC today.      Relevant Orders   CBC with Differential/Platelet   Ambulatory referral to Hematology / Oncology   Personal history of tobacco use, presenting hazards to health    Referral for lung CT screening, missed follow-up screening.      Relevant Orders   Ambulatory Referral for Lung Cancer Scre   Decreased appetite    With some mild weight loss present, 10 pounds past year and 5 pounds since April 14th.  Has diagnosis of underlying MGUS and ITP, will place referral for her to return to hematology as has missed several follow-up with them, discussed at length with patient need to maintain follow-up visits. Labs today to include CBC, LFT, TSH.  Script for Pepcid sent for reflux.  Will place referral to GI due to declining appetite and weight loss with history of smoking -- had negative iFOBT, but may benefit  from colonoscopy.  Appreciate their input.  Return in 4 weeks.      Relevant Orders   Ambulatory referral to Gastroenterology    Other Visit Diagnoses    Weight loss       Refer to decreased appetite plan for further   Relevant Orders   TSH   Hepatic function panel   Ambulatory referral to Gastroenterology       Follow up plan: Return in about 4 weeks (around 04/25/2020) for Weight check.

## 2020-03-28 NOTE — Assessment & Plan Note (Signed)
Referral to return to lung CT screening placed.

## 2020-03-29 NOTE — Progress Notes (Signed)
Please let patient know that thyroid and current liver labs are within normal ranges, awaiting one more liver function test to result.  Her platelets remain on low side and continues to have some anemia present.  I do highly recommend she schedule follow-up with Dr. Grayland Ormond and her kidney doctor as soon as possible, so they can reassess and ensure no further needs.  Have a great day!!

## 2020-03-30 LAB — CBC WITH DIFFERENTIAL/PLATELET
Basophils Absolute: 0 10*3/uL (ref 0.0–0.2)
Basos: 1 %
EOS (ABSOLUTE): 0.2 10*3/uL (ref 0.0–0.4)
Eos: 3 %
Hematocrit: 32.5 % — ABNORMAL LOW (ref 34.0–46.6)
Hemoglobin: 10.3 g/dL — ABNORMAL LOW (ref 11.1–15.9)
Immature Grans (Abs): 0 10*3/uL (ref 0.0–0.1)
Immature Granulocytes: 1 %
Lymphocytes Absolute: 2 10*3/uL (ref 0.7–3.1)
Lymphs: 36 %
MCH: 29.2 pg (ref 26.6–33.0)
MCHC: 31.7 g/dL (ref 31.5–35.7)
MCV: 92 fL (ref 79–97)
Monocytes Absolute: 0.4 10*3/uL (ref 0.1–0.9)
Monocytes: 8 %
Neutrophils Absolute: 3 10*3/uL (ref 1.4–7.0)
Neutrophils: 51 %
Platelets: 87 10*3/uL — CL (ref 150–450)
RBC: 3.53 x10E6/uL — ABNORMAL LOW (ref 3.77–5.28)
RDW: 14.8 % (ref 11.7–15.4)
WBC: 5.7 10*3/uL (ref 3.4–10.8)

## 2020-03-30 LAB — HEPATIC FUNCTION PANEL
ALT: <5 IU/L (ref 0–32)
AST: 9 IU/L (ref 0–40)
Albumin: 4 g/dL (ref 3.7–4.7)
Alkaline Phosphatase: 48 IU/L (ref 48–121)
Bilirubin Total: <0.2 mg/dL (ref 0.0–1.2)
Bilirubin, Direct: 0.06 mg/dL (ref 0.00–0.40)
Total Protein: 6.6 g/dL (ref 6.0–8.5)

## 2020-03-30 LAB — TSH: TSH: 0.533 u[IU]/mL (ref 0.450–4.500)

## 2020-04-12 ENCOUNTER — Other Ambulatory Visit: Payer: Self-pay | Admitting: Nurse Practitioner

## 2020-04-17 ENCOUNTER — Telehealth: Payer: Self-pay | Admitting: *Deleted

## 2020-04-17 DIAGNOSIS — Z87891 Personal history of nicotine dependence: Secondary | ICD-10-CM

## 2020-04-17 DIAGNOSIS — Z122 Encounter for screening for malignant neoplasm of respiratory organs: Secondary | ICD-10-CM

## 2020-04-17 NOTE — Telephone Encounter (Signed)
Patient has been notified that annual lung cancer screening low dose CT scan is due currently or will be in near future. Confirmed that patient is within the age range of 55-77, and asymptomatic, (no signs or symptoms of lung cancer). Patient denies illness that would prevent curative treatment for lung cancer if found. Verified smoking history, (former, quit 07/21/17, 43 pack year). The shared decision making visit was done 02/03/17. Patient is agreeable for CT scan being scheduled.

## 2020-04-18 ENCOUNTER — Ambulatory Visit (INDEPENDENT_AMBULATORY_CARE_PROVIDER_SITE_OTHER): Payer: Medicare Other | Admitting: Licensed Clinical Social Worker

## 2020-04-18 DIAGNOSIS — E7800 Pure hypercholesterolemia, unspecified: Secondary | ICD-10-CM | POA: Diagnosis not present

## 2020-04-18 DIAGNOSIS — I10 Essential (primary) hypertension: Secondary | ICD-10-CM | POA: Diagnosis not present

## 2020-04-18 DIAGNOSIS — W19XXXA Unspecified fall, initial encounter: Secondary | ICD-10-CM

## 2020-04-18 DIAGNOSIS — N1832 Chronic kidney disease, stage 3b: Secondary | ICD-10-CM

## 2020-04-18 DIAGNOSIS — F209 Schizophrenia, unspecified: Secondary | ICD-10-CM

## 2020-04-18 NOTE — Chronic Care Management (AMB) (Signed)
Chronic Care Management    Clinical Social Work Follow Up Note  04/18/2020 Name: Denise Everett MRN: [id number] DOB: Mar 30, 1951  Denise Everett is a 69 y.o. year old female who is a primary care patient of Cannady, Barbaraann Faster, NP. The CCM team was consulted for assistance with Level of Care Concerns.   Review of patient status, including review of consultants reports, other relevant assessments, and collaboration with appropriate care team members and the patient's provider was performed as part of comprehensive patient evaluation and provision of chronic care management services.    SDOH (Social Determinants of Health) assessments performed: Yes    Outpatient Encounter Medications as of 04/18/2020  Medication Sig  . Aspirin-Acetaminophen-Caffeine (GOODYS EXTRA STRENGTH) [phone] MG PACK Take by mouth.  Marland Kitchen atorvastatin (LIPITOR) 10 MG tablet TAKE 1 TABLET BY MOUTH DAILY AT 6 PM  . famotidine (PEPCID) 20 MG tablet Take 1 tablet (20 mg total) by mouth daily.  Marland Kitchen gabapentin (NEURONTIN) 300 MG capsule TAKE 1 CAPSULE BY MOUTH 3 TIMES DAILY  . lisinopril (ZESTRIL) 10 MG tablet TAKE ONE-HALF TABLET BY MOUTH DAILY  . raloxifene (EVISTA) 60 MG tablet TAKE 1 TABLET BY MOUTH EVERY DAY  . risperiDONE (RISPERDAL) 2 MG tablet TAKE 1/2 TO 1 TABLET BY MOUTH AT BEDTIME   No facility-administered encounter medications on file as of 04/18/2020.     Goals Addressed    . SW: Pt- "I struggle sometimes." (pt-stated)       CARE PLAN ENTRY (see longitudinal plan of care for additional care plan information)  Current Barriers:  . Limited social support . ADL IADL limitations . Family and relationship dysfunction . Social Isolation . Limited access to caregiver . Memory Deficits . Inability to perform ADL's independently . Inability to perform IADL's independently  Clinical Social Work Clinical Goal(s):  Marland Kitchen Over the next 120 days, patient will work with SW to address concerns related to gaining additional  support/resource connection in order to maintain health . Over the next 120 days, patient will demonstrate improved adherence to self care as evidenced by implementing healthy self-care into her daily routine such as: attending ALL medical appointments including specialist, deep breathing exercsies, eating 3 meals per day, taking medications as prescribed, drinking water and daily exercise to improve mobility.  . Over the next 120 days, patient will demonstrate improved health management independence as evidenced by implementing healthy self-care and positive support/resources into her daily routine to cope with stressors and improve overall health and well-being   Interventions: . Inter-disciplinary care team collaboration (see longitudinal plan of care) . Patient interviewed and appropriate assessments performed . Provided mental health counseling with regard to healthy self care management . Patient with schizophrenia, CKD, HTN, and MGUS.  Patient has a history of not keep up with appointment, making them and remembering them for specialists -- oncology and nephrology.  . Provided patient with information about available socialization opportunities for seniors within the area such as [place]. . Encouraged patient to consider mental health support involvement. LCSW provided list of available mental health resources within her area that accept her insurance.  . Discussed plans with patient for ongoing care management follow up and provided patient with direct contact information for care management team . Advised patient to use a calendar to keep up with her future appointments . Patient was able to recall PCP appointment time/date for next week. She reports that she drive herself to this appointment. Patient declines any current transportation needs. Marland Kitchen  Patient admits ongoing loss of appetite but improvement with fluid intake. Marland Kitchen LCSW discussed coping skills for healthy living. SW  used empathetic and active and reflective listening, validated patient's feelings/concerns, and provided emotional support. LCSW provided self-care education to help manage her multiple health conditions and improve her overall mood.  . Assisted patient/caregiver with obtaining information about health plan benefits . Provided education and assistance to client regarding Advanced Directives.  Patient Self Care Activities:  . Lack of a positive support network . Lacks social connections  Initial goal documentation      Follow Up Plan: SW will follow up with patient by phone over the next quarter  Eula Fried, [place], [email or web address], [email or web address] Phone: [phone]

## 2020-04-23 ENCOUNTER — Other Ambulatory Visit: Payer: Self-pay | Admitting: Nurse Practitioner

## 2020-04-25 ENCOUNTER — Encounter: Payer: Self-pay | Admitting: Nurse Practitioner

## 2020-04-25 ENCOUNTER — Other Ambulatory Visit: Payer: Self-pay

## 2020-04-25 ENCOUNTER — Ambulatory Visit (INDEPENDENT_AMBULATORY_CARE_PROVIDER_SITE_OTHER): Payer: Medicare Other | Admitting: Nurse Practitioner

## 2020-04-25 DIAGNOSIS — R634 Abnormal weight loss: Secondary | ICD-10-CM

## 2020-04-25 NOTE — Progress Notes (Signed)
BP 108/67   Pulse (!) 53   Temp 98.9 F (37.2 C) (Oral)   Wt 123 lb 6.4 oz (56 kg)   LMP  (LMP Unknown)   SpO2 100%   BMI 24.23 kg/m    Subjective:    Patient ID: Denise Everett, female    DOB: [birth year], 69 y.o.   MRN: [id number]  HPI: Denise Everett is a 69 y.o. female  Chief Complaint  Patient presents with  . Weight Check    4 week f/up   WEIGHT LOSS: Has had 5 pound loss since April 14th (10 pound loss since 01/28/19 -- weighed 137 lbs at that visit), reports she is eating well and feeling better, has no difficulty swallowing and no feeling full quickly.  Currently eating two meals a day, more than previous, does eat snacks in between.  Takes occasional TUMS for heart burn.  Has not had colonoscopy, reports she was told due to not having help to pick her up she could not go.    A referral was placed on 03/28/2020 to GI for further work-up, but she reports not having heard from them.  Denies SOB or CP.  History of smoking, started at 13 and quit 4 years ago.  Had initial lung CT screening in 2018 which showed emphysema and was to repeat in 12 months, missed screening -- she reports she is going on 24th of June.  IFOBT negative 02/16/20.  Followed by Dr. Grayland Ormond for MGUS and ITP, but has not seen him since 11/24/17 -- last CBC in May 2021 PLT 87 and she is to follow-up with oncology, but has not as of yet.  Denies fatigue, night sweats, or fever. Fever: no Nausea: no Vomiting: no Weight loss: yes Decreased appetite: no Diarrhea: no Constipation: no Blood in stool: no Heartburn: yes Jaundice: no Rash: no Dysuria/urinary frequency: no Hematuria: no History of sexually transmitted disease: no Recurrent NSAID use: no  Relevant past medical, surgical, family and social history reviewed and updated as indicated. Interim medical history since our last visit reviewed. Allergies and medications reviewed and updated.  Review of Systems  Constitutional: Positive for unexpected  weight change. Negative for activity change, appetite change, diaphoresis, fatigue and fever.  Respiratory: Negative for cough, chest tightness and shortness of breath.   Cardiovascular: Negative for chest pain, palpitations and leg swelling.  Gastrointestinal: Negative for abdominal distention, abdominal pain, blood in stool, constipation, diarrhea, nausea and vomiting.  Neurological: Negative.   Psychiatric/Behavioral: Negative for decreased concentration, self-injury, sleep disturbance and suicidal ideas. The patient is not nervous/anxious.     Per HPI unless specifically indicated above     Objective:    BP 108/67   Pulse (!) 53   Temp 98.9 F (37.2 C) (Oral)   Wt 123 lb 6.4 oz (56 kg)   LMP  (LMP Unknown)   SpO2 100%   BMI 24.23 kg/m   Wt Readings from Last 3 Encounters:  04/25/20 123 lb 6.4 oz (56 kg)  03/28/20 127 lb 3.2 oz (57.7 kg)  02/29/20 128 lb 3.2 oz (58.2 kg)    Physical Exam Vitals and nursing note reviewed.  Constitutional:      General: She is awake. She is not in acute distress.    Appearance: She is well-developed and well-groomed. She is not ill-appearing.  HENT:     Head: Normocephalic.     Right Ear: Hearing normal.     Left Ear: Hearing normal.  Nose: Nose normal.     Mouth/Throat:     Mouth: Mucous membranes are moist.  Eyes:     General: Lids are normal.        Right eye: No discharge.        Left eye: No discharge.     Conjunctiva/sclera: Conjunctivae normal.     Pupils: Pupils are equal, round, and reactive to light.  Neck:     Thyroid: No thyromegaly.     Vascular: No carotid bruit.  Cardiovascular:     Rate and Rhythm: Normal rate and regular rhythm.     Heart sounds: Normal heart sounds. No murmur heard.  No gallop.   Pulmonary:     Effort: Pulmonary effort is normal. No accessory muscle usage or respiratory distress.     Breath sounds: Normal breath sounds.  Abdominal:     General: Bowel sounds are normal. There is no  distension or abdominal bruit.     Palpations: Abdomen is soft. There is no hepatomegaly or splenomegaly.     Tenderness: There is no abdominal tenderness.     Hernia: No hernia is present.  Musculoskeletal:     Cervical back: Normal range of motion and neck supple.     Right lower leg: No edema.     Left lower leg: No edema.  Lymphadenopathy:     Head:     Right side of head: No submental, submandibular, tonsillar, preauricular or posterior auricular adenopathy.     Left side of head: No submental, submandibular, tonsillar, preauricular or posterior auricular adenopathy.     Cervical: No cervical adenopathy.  Skin:    General: Skin is warm and dry.  Neurological:     Mental Status: She is alert and oriented to person, place, and time.  Psychiatric:        Attention and Perception: Attention normal.        Mood and Affect: Mood normal.        Speech: Speech normal.        Behavior: Behavior normal. Behavior is cooperative.     Results for orders placed or performed in visit on 03/28/20  CBC with Differential/Platelet  Result Value Ref Range   WBC 5.7 3.4 - 10.8 x10E3/uL   RBC 3.53 (L) 3.77 - 5.28 x10E6/uL   Hemoglobin 10.3 (L) 11.1 - 15.9 g/dL   Hematocrit 32.5 (L) 34.0 - 46.6 %   MCV 92 79 - 97 fL   MCH 29.2 26.6 - 33.0 pg   MCHC 31.7 31 - 35 g/dL   RDW 14.8 11.7 - 15.4 %   Platelets 87 (LL) 150 - 450 x10E3/uL   Neutrophils 51 Not Estab. %   Lymphs 36 Not Estab. %   Monocytes 8 Not Estab. %   Eos 3 Not Estab. %   Basos 1 Not Estab. %   Neutrophils Absolute 3.0 1 - 7 x10E3/uL   Lymphocytes Absolute 2.0 0 - 3 x10E3/uL   Monocytes Absolute 0.4 0 - 0 x10E3/uL   EOS (ABSOLUTE) 0.2 0.0 - 0.4 x10E3/uL   Basophils Absolute 0.0 0 - 0 x10E3/uL   Immature Granulocytes 1 Not Estab. %   Immature Grans (Abs) 0.0 0.0 - 0.1 x10E3/uL   Hematology Comments: Note:   TSH  Result Value Ref Range   TSH 0.533 0.450 - 4.500 uIU/mL  Hepatic function panel  Result Value Ref Range    Total Protein 6.6 6.0 - 8.5 g/dL   Albumin 4.0 3.7 - 4.7 g/dL  Bilirubin Total <0.2 0.0 - 1.2 mg/dL   Bilirubin, Direct 0.06 0.00 - 0.40 mg/dL   Alkaline Phosphatase 48 48 - 121 IU/L   AST 9 0 - 40 IU/L   ALT <5 0 - 32 IU/L      Assessment & Plan:   Problem List Items Addressed This Visit      Other   Weight loss    With some mild weight loss present, 10 pounds past year and 4 pounds since last visit.  She denies any symptoms at this time.  Has diagnosis of underlying MGUS and ITP, placed referral for her to return to hematology at last visit as has missed several follow-up with them, discussed at length with patient need to maintain follow-up visits. Continue Pepcid for reflux..  Will check on referral to GI as she reports not hearing from them + with weight loss and history of smoking no previous colonoscopy -- had negative iFOBT, but may benefit from colonoscopy.  Appreciate their input.  Return in 8 weeks.          Follow up plan: Return in about 8 weeks (around 06/20/2020) for Weight check.

## 2020-04-25 NOTE — Assessment & Plan Note (Signed)
With some mild weight loss present, 10 pounds past year and 4 pounds since last visit.  She denies any symptoms at this time.  Has diagnosis of underlying MGUS and ITP, placed referral for her to return to hematology at last visit as has missed several follow-up with them, discussed at length with patient need to maintain follow-up visits. Continue Pepcid for reflux..  Will check on referral to GI as she reports not hearing from them + with weight loss and history of smoking no previous colonoscopy -- had negative iFOBT, but may benefit from colonoscopy.  Appreciate their input.  Return in 8 weeks.

## 2020-04-25 NOTE — Patient Instructions (Addendum)
CALL DR. [facility] AND SCHEDULE FOLLOW-UP AT [phone] OR [phone] [facility] NUMBER)  FOR LUNG CT ON June 24TH LOCATION --- [address] [place] [address]    Healthy Eating Following a healthy eating pattern may help you to achieve and maintain a healthy body weight, reduce the risk of chronic disease, and live a long and productive life. It is important to follow a healthy eating pattern at an appropriate calorie level for your body. Your nutritional needs should be met primarily through food by choosing a variety of nutrient-rich foods. What are tips for following this plan? Reading food labels  Read labels and choose the following: ? Reduced or low sodium. ? Juices with 100% fruit juice. ? Foods with low saturated fats and high polyunsaturated and monounsaturated fats. ? Foods with whole grains, such as whole wheat, cracked wheat, brown rice, and wild rice. ? Whole grains that are fortified with folic acid. This is recommended for women who are pregnant or who want to become pregnant.  Read labels and avoid the following: ? Foods with a lot of added sugars. These include foods that contain brown sugar, corn sweetener, corn syrup, dextrose, fructose, glucose, high-fructose corn syrup, honey, invert sugar, lactose, malt syrup, maltose, molasses, raw sugar, sucrose, trehalose, or turbinado sugar.  Do not eat more than the following amounts of added sugar per day:  6 teaspoons (25 g) for women.  9 teaspoons (38 g) for men. ? Foods that contain processed or refined starches and grains. ? Refined grain products, such as white flour, degermed cornmeal, white bread, and white rice. Shopping  Choose nutrient-rich snacks, such as vegetables, whole fruits, and nuts. Avoid high-calorie and high-sugar snacks, such as potato chips, fruit snacks, and candy.  Use oil-based dressings and spreads on foods instead of solid fats such as butter, stick margarine, or  cream cheese.  Limit pre-made sauces, mixes, and "instant" products such as flavored rice, instant noodles, and ready-made pasta.  Try more plant-protein sources, such as tofu, tempeh, black beans, edamame, lentils, nuts, and seeds.  Explore eating plans such as the Mediterranean diet or vegetarian diet. Cooking  Use oil to saut or stir-fry foods instead of solid fats such as butter, stick margarine, or lard.  Try baking, boiling, grilling, or broiling instead of frying.  Remove the fatty part of meats before cooking.  Steam vegetables in water or broth. Meal planning   At meals, imagine dividing your plate into fourths: ? One-half of your plate is fruits and vegetables. ? One-fourth of your plate is whole grains. ? One-fourth of your plate is protein, especially lean meats, poultry, eggs, tofu, beans, or nuts.  Include low-fat dairy as part of your daily diet. Lifestyle  Choose healthy options in all settings, including home, work, school, restaurants, or stores.  Prepare your food safely: ? Wash your hands after handling raw meats. ? Keep food preparation surfaces clean by regularly washing with hot, soapy water. ? Keep raw meats separate from ready-to-eat foods, such as fruits and vegetables. ? Cook seafood, meat, poultry, and eggs to the recommended internal temperature. ? Store foods at safe temperatures. In general:  Keep cold foods at 33F (4.4C) or below.  Keep hot foods at 133F (60C) or above.  Keep your freezer at [facility] (-17.8C) or below.  Foods are no longer safe to eat when they have been between the temperatures of 40-133F (4.4-60C) for more than 2 hours. What foods should I eat? Fruits Aim to  eat 2 cup-equivalents of fresh, canned (in natural juice), or frozen fruits each day. Examples of 1 cup-equivalent of fruit include 1 small apple, 8 large strawberries, 1 cup canned fruit,  cup dried fruit, or 1 cup 100% juice. Vegetables Aim to eat 2-3  cup-equivalents of fresh and frozen vegetables each day, including different varieties and colors. Examples of 1 cup-equivalent of vegetables include 2 medium carrots, 2 cups raw, leafy greens, 1 cup chopped vegetable (raw or cooked), or 1 medium baked potato. Grains Aim to eat 6 ounce-equivalents of whole grains each day. Examples of 1 ounce-equivalent of grains include 1 slice of bread, 1 cup ready-to-eat cereal, 3 cups popcorn, or  cup cooked rice, pasta, or cereal. Meats and other proteins Aim to eat 5-6 ounce-equivalents of protein each day. Examples of 1 ounce-equivalent of protein include 1 egg, 1/2 cup nuts or seeds, or 1 tablespoon (16 g) peanut butter. A cut of meat or fish that is the size of a deck of cards is about 3-4 ounce-equivalents.  Of the protein you eat each week, try to have at least 8 ounces come from seafood. This includes salmon, trout, herring, and anchovies. Dairy Aim to eat 3 cup-equivalents of fat-free or low-fat dairy each day. Examples of 1 cup-equivalent of dairy include 1 cup (240 mL) milk, 8 ounces (250 g) yogurt, 1 ounces (44 g) natural cheese, or 1 cup (240 mL) fortified soy milk. Fats and oils  Aim for about 5 teaspoons (21 g) per day. Choose monounsaturated fats, such as canola and olive oils, avocados, peanut butter, and most nuts, or polyunsaturated fats, such as sunflower, corn, and soybean oils, walnuts, pine nuts, sesame seeds, sunflower seeds, and flaxseed. Beverages  Aim for six 8-oz glasses of water per day. Limit coffee to three to five 8-oz cups per day.  Limit caffeinated beverages that have added calories, such as soda and energy drinks.  Limit alcohol intake to no more than 1 drink a day for nonpregnant women and 2 drinks a day for men. One drink equals 12 oz of beer (355 mL), 5 oz of wine (148 mL), or 1 oz of hard liquor (44 mL). Seasoning and other foods  Avoid adding excess amounts of salt to your foods. Try flavoring foods with herbs and  spices instead of salt.  Avoid adding sugar to foods.  Try using oil-based dressings, sauces, and spreads instead of solid fats. This information is based on general [place] nutrition guidelines. For more information, visit [employer]. Exact amounts may vary based on your nutrition needs. Summary  A healthy eating plan may help you to maintain a healthy weight, reduce the risk of chronic diseases, and stay active throughout your life.  Plan your meals. Make sure you eat the right portions of a variety of nutrient-rich foods.  Try baking, boiling, grilling, or broiling instead of frying.  Choose healthy options in all settings, including home, work, school, restaurants, or stores. This information is not intended to replace advice given to you by your health care provider. Make sure you discuss any questions you have with your health care provider. Document Revised: 02/08/2018 Document Reviewed: 02/08/2018 Elsevier Patient Education  [place].

## 2020-05-01 ENCOUNTER — Telehealth: Payer: Self-pay

## 2020-05-01 ENCOUNTER — Telehealth (INDEPENDENT_AMBULATORY_CARE_PROVIDER_SITE_OTHER): Payer: Medicare Other | Admitting: Gastroenterology

## 2020-05-01 DIAGNOSIS — R634 Abnormal weight loss: Secondary | ICD-10-CM

## 2020-05-01 NOTE — Telephone Encounter (Signed)
Called patient and was not able to leave her a voicemail since it is not set-up. Patient is needing an EGD and Colonoscopy scheduled. I will call patient back to ask when she would like to schedule her procedures.  EGD: Weight loss R63.4 Colonoscopy: Screening colonoscopy Z12.11  Patient needs to follow up 2-4 weeks after procedures.

## 2020-05-01 NOTE — Progress Notes (Signed)
Denise Everett [address]  [place]  [place], [place] [address]  Main: [phone]  Fax: [phone]   Gastroenterology Consultation  Referring Provider:     Venita Lick, NP Primary Care Physician:  Denise Lick, NP Reason for Consultation:    Weight loss        HPI:   Virtual Visit via Telephone Note  I connected with patient on 05/01/20 at  3:15 PM EDT by telephone and verified that I am speaking with the correct person using two identifiers.   I discussed the limitations, risks, security and privacy concerns of performing an evaluation and management service by telephone and the availability of in person appointments. I also discussed with the patient that there may be a patient responsible charge related to this service. The patient expressed understanding and agreed to proceed.  Location of the patient: Home Location of provider: Home Participating persons: Patient and provider only   History of Present Illness: Chief complaint: Reflux, early satiety  Denise Everett is a 69 y.o. y/o female referred for consultation & management  by Dr. Venita Lick, NP.  Patient reports 2 to 93-month history of early satiety and weight loss.  No abdominal pain.  No altered bowel habits.  No blood in stool.  No prior EGD or colonoscopy.  No dysphagia.  Also has history of anemia thrombocytopenia and sees Dr. Grayland Everett.  Has a diagnosis of ITP  Past Medical History:  Diagnosis Date  . Abnormal weight loss 06/07/2015  . Allergy   . Cataract   . Chronic kidney disease   . Chronic pain syndrome   . Hyperlipidemia   . Hypertension   . Leg pain   . Osteoporosis    hips  . Renal insufficiency   . Schizophrenia ([place])   . Thrombocytopenia ([place])   . Tobacco use disorder     Past Surgical History:  Procedure Laterality Date  . ankle surgery Right    x's 2  . CATARACT EXTRACTION      Prior to Admission medications   Medication Sig Start Date End Date  Taking? Authorizing Provider  Aspirin-Acetaminophen-Caffeine ([place]) [id number] MG PACK Take by mouth.    [provider]  atorvastatin (LIPITOR) 10 MG tablet TAKE 1 TABLET BY MOUTH DAILY AT 6 PM 11/23/19   Cannady, Jolene T, NP  famotidine (PEPCID) 20 MG tablet Take 1 tablet (20 mg total) by mouth daily. 03/28/20   Cannady, Henrine Screws T, NP  gabapentin (NEURONTIN) 300 MG capsule TAKE 1 CAPSULE BY MOUTH 3 TIMES DAILY 04/12/20   Cannady, Jolene T, NP  lisinopril (ZESTRIL) 10 MG tablet TAKE ONE-HALF TABLET BY MOUTH DAILY 04/23/20   Cannady, Henrine Screws T, NP  raloxifene (EVISTA) 60 MG tablet TAKE 1 TABLET BY MOUTH EVERY DAY 09/12/19   Cannady, Jolene T, NP  risperiDONE (RISPERDAL) 2 MG tablet TAKE 1/2 TO 1 TABLET BY MOUTH AT BEDTIME 02/02/20   Marnee Guarneri T, NP    Family History  Problem Relation Age of Onset  . Hyperlipidemia Mother   . Hypertension Mother      Social History   Tobacco Use  . Smoking status: Former Smoker    Packs/day: 1.00    Years: 43.00    Pack years: 43.00    Types: Cigarettes    Quit date: 07/21/2017    Years since quitting: 2.7  . Smokeless tobacco: Former Systems developer    Types: Snuff  Vaping Use  . Vaping Use:  Never used  Substance Use Topics  . Alcohol use: No  . Drug use: No    Allergies as of 05/01/2020 - Review Complete 04/25/2020  Allergen Reaction Noted  . No known allergies      Review of Systems:    All systems reviewed and negative except where noted in HPI.   Observations/Objective:  Labs: CBC    Component Value Date/Time   WBC 5.7 03/28/2020 1426   WBC 4.6 11/24/2017 1038   RBC 3.53 (L) 03/28/2020 1426   RBC 3.66 (L) 11/24/2017 1038   HGB 10.3 (L) 03/28/2020 1426   HCT 32.5 (L) 03/28/2020 1426   PLT 87 (LL) 03/28/2020 1426   MCV 92 03/28/2020 1426   MCV 92 09/08/2014 1048   MCH 29.2 03/28/2020 1426   MCH 30.3 11/24/2017 1038   MCHC 31.7 03/28/2020 1426   MCHC 32.6 11/24/2017 1038   RDW 14.8 03/28/2020 1426   RDW  14.0 09/08/2014 1048   LYMPHSABS 2.0 03/28/2020 1426   LYMPHSABS 1.3 09/08/2014 1048   MONOABS 0.4 11/24/2017 1038   MONOABS 0.3 09/08/2014 1048   EOSABS 0.2 03/28/2020 1426   EOSABS 0.1 09/08/2014 1048   BASOSABS 0.0 03/28/2020 1426   BASOSABS 0.1 09/08/2014 1048   CMP     Component Value Date/Time   NA 135 02/29/2020 1108   NA 129 (L) 06/23/2014 1124   K 4.3 02/29/2020 1108   K 5.0 06/23/2014 1124   CL 99 02/29/2020 1108   CL 98 06/23/2014 1124   CO2 22 02/29/2020 1108   CO2 21 06/23/2014 1124   GLUCOSE 81 02/29/2020 1108   GLUCOSE 99 11/24/2017 1038   GLUCOSE 97 06/23/2014 1124   BUN 6 (L) 02/29/2020 1108   BUN 12 06/23/2014 1124   CREATININE 1.67 (H) 02/29/2020 1108   CREATININE 1.57 (H) 06/23/2014 1124   CALCIUM 9.0 02/29/2020 1108   CALCIUM 8.1 (L) 06/23/2014 1124   PROT 6.6 03/28/2020 1426   ALBUMIN 4.0 03/28/2020 1426   AST 9 03/28/2020 1426   ALT <5 03/28/2020 1426   ALKPHOS 48 03/28/2020 1426   BILITOT <0.2 03/28/2020 1426   GFRNONAA 31 (L) 02/29/2020 1108   GFRNONAA 34 (L) 06/23/2014 1124   GFRAA 35 (L) 02/29/2020 1108   GFRAA 40 (L) 06/23/2014 1124    Imaging Studies: No results found.  Assessment and Plan:   Denise Everett is a 69 y.o. y/o female has been referred for early satiety, weight loss   Assessment and Plan: Proceed with colonoscopy given weight loss and no prior screening  Proceed with EGD for early satiety weight loss  Continue follow-up with Dr. Grayland Everett for anemia and ITP  I have discussed alternative options, risks & benefits,  which include, but are not limited to, bleeding, infection, perforation,respiratory complication & drug reaction.  The patient agrees with this plan & written consent will be obtained.     Follow Up Instructions:   I discussed the assessment and treatment plan with the patient. The patient was provided an opportunity to ask questions and all were answered. The patient agreed with the plan and  demonstrated an understanding of the instructions.   The patient was advised to call back or seek an in-person evaluation if the symptoms worsen or if the condition fails to improve as anticipated.  I provided 23minutes of non-face-to-face time during this encounter.   Virgel Manifold, MD  Speech recognition software was used to dictate the above note.

## 2020-05-03 ENCOUNTER — Ambulatory Visit: Admission: RE | Admit: 2020-05-03 | Payer: Medicare Other | Source: Ambulatory Visit

## 2020-05-04 ENCOUNTER — Ambulatory Visit (INDEPENDENT_AMBULATORY_CARE_PROVIDER_SITE_OTHER): Payer: Medicare Other | Admitting: Nurse Practitioner

## 2020-05-04 ENCOUNTER — Other Ambulatory Visit: Payer: Self-pay

## 2020-05-04 ENCOUNTER — Encounter: Payer: Self-pay | Admitting: Nurse Practitioner

## 2020-05-04 VITALS — BP 128/78 | HR 73 | Temp 98.1°F | Wt 119.4 lb

## 2020-05-04 DIAGNOSIS — R634 Abnormal weight loss: Secondary | ICD-10-CM | POA: Diagnosis not present

## 2020-05-04 DIAGNOSIS — M25511 Pain in right shoulder: Secondary | ICD-10-CM | POA: Diagnosis not present

## 2020-05-04 MED ORDER — TIZANIDINE HCL 4 MG PO TABS
4.0000 mg | ORAL_TABLET | Freq: Four times a day (QID) | ORAL | 0 refills | Status: DC | PRN
Start: 2020-05-04 — End: 2020-05-22

## 2020-05-04 MED ORDER — GABAPENTIN 300 MG PO CAPS: 300.0000 mg | ORAL_CAPSULE | Freq: Three times a day (TID) | ORAL | 3 refills | Status: DC

## 2020-05-04 NOTE — Progress Notes (Signed)
BP 128/78 (BP Location: Left Arm, Cuff Size: Normal)   Pulse 73   Temp 98.1 F (36.7 C) (Oral)   Wt 119 lb 6.4 oz (54.2 kg)   LMP  (LMP Unknown)   SpO2 98%   BMI 23.44 kg/m    Subjective:    Patient ID: Denise Everett, female    DOB: 1951-05-18, 69 y.o.   MRN: [id number]  HPI: Denise Everett is a 69 y.o. female  Chief Complaint  Patient presents with  . Shoulder Pain    R shoulder, pt states it started hurting on Monday. Does not remember injuring it, but states she did fall off her couch about 2 weeks ago. Does not recall falling on her shoulder    SHOULDER PAIN Started having pain to right shoulder on Monday.  Reports two weeks ago she did fall off couch, during time she "tapped" her forehead and right knee -- but no major injury.  She does not recall hitting shoulder at the time.  No recent heavy lifting at home.  She denies SOB, CP, or diaphoresis. Duration: days Involved shoulder: right Mechanism of injury: unknown Location: posterior Onset:sudden Severity: 10/10  Quality:  dull, aching and throbbing Frequency: constant Radiation: no Aggravating factors: lifting and movement  Alleviating factors: Goodies and Gabapentin Status: fluctuating Treatments attempted: Goodies and Gabapentin Relief with NSAIDs?:  No NSAIDs Taken Weakness: no Numbness: no Decreased grip strength: no Redness: no Swelling: no Bruising: no Fevers: no   WEIGHT LOSS: Has had 5 pound loss since April 14th(10 pound loss since 01/28/19 -- weighed 137 lbs at that visit and today has lost 4 pounds from previous visit), reports her appetite is decreased.  Currently eating two meals a day, more than previous, does eat snacks in between. Takes occasional TUMS for heart burn. Has not had colonoscopy, reports she was told due to not having help to pick her up she could not go.  She saw GI on 05/01/20 and is to schedule colonoscopy.    Denies SOB or CP. History of smoking, started at 13 and quit 4  years ago. Had initial lung CT screening in 2018 which showed emphysema and was to repeat in 12 months, missed screening -- she was to go for low dose CT yesterday and missed this -- she reports that she did not know the time but is now scheduled for July 14th. IFOBT negative 02/16/20. Followed by Dr. Grayland Ormond for MGUS and ITP, but has not seen him since 11/24/17 -- last CBC in May 2021 PLT 87 and she is to follow-up with oncology, goes July 2nd. Denies fatigue, night sweats, or fever. Fever:no Nausea:no Vomiting:no Weight loss:yes Decreased appetite:no Diarrhea:no Constipation:no Blood in stool:no Heartburn:yes Jaundice:no Rash:no Dysuria/urinary frequency:no Hematuria:no History of sexually transmitted disease:no Recurrent NSAID use:no  Relevant past medical, surgical, family and social history reviewed and updated as indicated. Interim medical history since our last visit reviewed. Allergies and medications reviewed and updated.  Review of Systems  Constitutional: Positive for unexpected weight change. Negative for activity change, appetite change, diaphoresis, fatigue and fever.  Respiratory: Negative for cough, chest tightness and shortness of breath.   Cardiovascular: Negative for chest pain, palpitations and leg swelling.  Gastrointestinal: Negative for abdominal distention, abdominal pain, blood in stool, constipation, diarrhea, nausea and vomiting.  Musculoskeletal: Positive for arthralgias.  Neurological: Negative.   Psychiatric/Behavioral: Negative for decreased concentration, self-injury, sleep disturbance and suicidal ideas. The patient is not nervous/anxious.     Per HPI unless  specifically indicated above     Objective:    BP 128/78 (BP Location: Left Arm, Cuff Size: Normal)   Pulse 73   Temp 98.1 F (36.7 C) (Oral)   Wt 119 lb 6.4 oz (54.2 kg)   LMP  (LMP Unknown)   SpO2 98%   BMI 23.44 kg/m   Wt Readings from Last 3 Encounters:  05/04/20  119 lb 6.4 oz (54.2 kg)  04/25/20 123 lb 6.4 oz (56 kg)  03/28/20 127 lb 3.2 oz (57.7 kg)    Physical Exam Vitals and nursing note reviewed.  Constitutional:      General: She is awake. She is not in acute distress.    Appearance: She is well-developed and well-groomed. She is not ill-appearing.  HENT:     Head: Normocephalic.     Right Ear: Hearing normal.     Left Ear: Hearing normal.     Nose: Nose normal.     Mouth/Throat:     Mouth: Mucous membranes are moist.  Eyes:     General: Lids are normal.        Right eye: No discharge.        Left eye: No discharge.     Conjunctiva/sclera: Conjunctivae normal.     Pupils: Pupils are equal, round, and reactive to light.  Neck:     Thyroid: No thyromegaly.     Vascular: No carotid bruit.  Cardiovascular:     Rate and Rhythm: Normal rate and regular rhythm.     Heart sounds: Normal heart sounds. No murmur heard.  No gallop.   Pulmonary:     Effort: Pulmonary effort is normal. No accessory muscle usage or respiratory distress.     Breath sounds: Normal breath sounds.  Abdominal:     General: Bowel sounds are normal. There is no distension or abdominal bruit.     Palpations: Abdomen is soft. There is no hepatomegaly or splenomegaly.     Tenderness: There is no abdominal tenderness.     Hernia: No hernia is present.  Musculoskeletal:     Right shoulder: Tenderness (mild to posterior shoulder) present. No swelling, laceration, bony tenderness or crepitus. Normal range of motion. Normal strength. Normal pulse.     Left shoulder: Normal.     Cervical back: Normal range of motion and neck supple.     Right lower leg: No edema.     Left lower leg: No edema.     Comments: Normal ROM of right shoulder with mild tenderness on palpation posterior shoulder.  No bruising or rashes noted.  No positive findings on special ROM testing.    Lymphadenopathy:     Head:     Right side of head: No submental, submandibular, tonsillar, preauricular or  posterior auricular adenopathy.     Left side of head: No submental, submandibular, tonsillar, preauricular or posterior auricular adenopathy.     Cervical: No cervical adenopathy.  Skin:    General: Skin is warm and dry.  Neurological:     Mental Status: She is alert and oriented to person, place, and time.  Psychiatric:        Attention and Perception: Attention normal.        Mood and Affect: Mood normal.        Speech: Speech normal.        Behavior: Behavior normal. Behavior is cooperative.    Results for orders placed or performed in visit on 03/28/20  CBC with Differential/Platelet  Result Value Ref  Range   WBC 5.7 3.4 - 10.8 x10E3/uL   RBC 3.53 (L) 3.77 - 5.28 x10E6/uL   Hemoglobin 10.3 (L) 11.1 - 15.9 g/dL   Hematocrit 32.5 (L) 34.0 - 46.6 %   MCV 92 79 - 97 fL   MCH 29.2 26.6 - 33.0 pg   MCHC 31.7 31 - 35 g/dL   RDW 14.8 11.7 - 15.4 %   Platelets 87 (LL) 150 - 450 x10E3/uL   Neutrophils 51 Not Estab. %   Lymphs 36 Not Estab. %   Monocytes 8 Not Estab. %   Eos 3 Not Estab. %   Basos 1 Not Estab. %   Neutrophils Absolute 3.0 1 - 7 x10E3/uL   Lymphocytes Absolute 2.0 0 - 3 x10E3/uL   Monocytes Absolute 0.4 0 - 0 x10E3/uL   EOS (ABSOLUTE) 0.2 0.0 - 0.4 x10E3/uL   Basophils Absolute 0.0 0 - 0 x10E3/uL   Immature Granulocytes 1 Not Estab. %   Immature Grans (Abs) 0.0 0.0 - 0.1 x10E3/uL   Hematology Comments: Note:   TSH  Result Value Ref Range   TSH 0.533 0.450 - 4.500 uIU/mL  Hepatic function panel  Result Value Ref Range   Total Protein 6.6 6.0 - 8.5 g/dL   Albumin 4.0 3.7 - 4.7 g/dL   Bilirubin Total <0.2 0.0 - 1.2 mg/dL   Bilirubin, Direct 0.06 0.00 - 0.40 mg/dL   Alkaline Phosphatase 48 48 - 121 IU/L   AST 9 0 - 40 IU/L   ALT <5 0 - 32 IU/L      Assessment & Plan:   Problem List Items Addressed This Visit      Other   Abnormal weight loss - Primary    Ongoing weight loss present, 10 pounds past year and 4 pounds since last visit.  She denies any  symptoms at this time, no B symptoms.  Has diagnosis of underlying MGUS and ITP, is to return to oncology July 2nd, discussed at length with patient need to maintain follow-up visits. Continue Pepcid for reflux.  Continue collaboration with GI with weight loss and history of smoking no previous colonoscopy -- had negative iFOBT, but may benefit from colonoscopy -- provided her number to schedule this as they did attempt to call her to schedule.  Appreciate their input.  Due to ongoing loss and early satiety will also obtain CT abdomen/pelvis.  She is scheduled for her annual lung CT July 14th and I highly recommended she attend that appointment.  Return in 6 weeks.      Relevant Orders   CT Abdomen Pelvis Wo Contrast   Right shoulder pain    Acute for 4 days.  No red flags on exam and normal ROM present.  Suspect more musculoskeletal.  Will send in script for Tizanidine.  Recommend she avoid taking Goodies and instead take Tylenol as needed.  Alternate heat and ice as needed.  Rest arm, no heavy lifting.  Return to office for ongoing or worsening symptoms.            Follow up plan: Return in about 6 weeks (around 06/15/2020) for Weight check.

## 2020-05-04 NOTE — Assessment & Plan Note (Signed)
Acute for 4 days.  No red flags on exam and normal ROM present.  Suspect more musculoskeletal.  Will send in script for Tizanidine.  Recommend she avoid taking Goodies and instead take Tylenol as needed.  Alternate heat and ice as needed.  Rest arm, no heavy lifting.  Return to office for ongoing or worsening symptoms.

## 2020-05-04 NOTE — Addendum Note (Signed)
Addended by: Marnee Guarneri T on: 05/04/2020 05:00 PM   Modules accepted: Orders

## 2020-05-04 NOTE — Patient Instructions (Addendum)
Call GI --- Herb Grays -- [phone] -- to schedule colonoscopy   Shoulder Pain Many things can cause shoulder pain, including:  An injury.  Moving the shoulder in the same way again and again (overuse).  Joint pain (arthritis). Pain can come from:  Swelling and irritation (inflammation) of any part of the shoulder.  An injury to the shoulder joint.  An injury to: ? Tissues that connect muscle to bone (tendons). ? Tissues that connect bones to each other (ligaments). ? Bones. Follow these instructions at home: Watch for changes in your symptoms. Let your doctor know about them. Follow these instructions to help with your pain. If you have a sling:  Wear the sling as told by your doctor. Remove it only as told by your doctor.  Loosen the sling if your fingers: ? Tingle. ? Become numb. ? Turn cold and blue.  Keep the sling clean.  If the sling is not waterproof: ? Do not let it get wet. ? Take the sling off when you shower or bathe. Managing pain, stiffness, and swelling   If told, put ice on the painful area: ? Put ice in a plastic bag. ? Place a towel between your skin and the bag. ? Leave the ice on for 20 minutes, 2-3 times a day. Stop putting ice on if it does not help with the pain.  Squeeze a soft ball or a foam pad as much as possible. This prevents swelling in the shoulder. It also helps to strengthen the arm. General instructions  Take over-the-counter and prescription medicines only as told by your doctor.  Keep all follow-up visits as told by your doctor. This is important. Contact a doctor if:  Your pain gets worse.  Medicine does not help your pain.  You have new pain in your arm, hand, or fingers. Get help right away if:  Your arm, hand, or fingers: ? Tingle. ? Are numb. ? Are swollen. ? Are painful. ? Turn white or blue. Summary  Shoulder pain can be caused by many things. These include injury, moving the shoulder in the same away again  and again, and joint pain.  Watch for changes in your symptoms. Let your doctor know about them.  This condition may be treated with a sling, ice, and pain medicine.  Contact your doctor if the pain gets worse or you have new pain. Get help right away if your arm, hand, or fingers tingle or get numb, swollen, or painful.  Keep all follow-up visits as told by your doctor. This is important. This information is not intended to replace advice given to you by your health care provider. Make sure you discuss any questions you have with your health care provider. Document Revised: 05/11/2018 Document Reviewed: 05/11/2018 Elsevier Patient Education  [place].

## 2020-05-04 NOTE — Assessment & Plan Note (Addendum)
Ongoing weight loss present, 10 pounds past year and 4 pounds since last visit.  She denies any symptoms at this time, no B symptoms.  Has diagnosis of underlying MGUS and ITP, is to return to oncology July 2nd, discussed at length with patient need to maintain follow-up visits. Continue Pepcid for reflux.  Continue collaboration with GI with weight loss and history of smoking no previous colonoscopy -- had negative iFOBT, but may benefit from colonoscopy -- provided her number to schedule this as they did attempt to call her to schedule.  Appreciate their input.  Due to ongoing loss and early satiety will also obtain CT abdomen/pelvis.  She is scheduled for her annual lung CT July 14th and I highly recommended she attend that appointment.  Return in 6 weeks.

## 2020-05-05 NOTE — Progress Notes (Signed)
  [facility]  Telephone:([phone]) [phone] Fax:(336) [phone]  ID: Denise Everett OB: 07-05-1951  MR#: [id number]  XFF#:[id number]  Patient Care Team: Venita Lick, NP as PCP - General (Nurse Practitioner) Lloyd Huger, MD as Consulting Physician (Oncology) Vanita Ingles, RN as Case Manager ([place]) Greg Cutter, LCSW as Social Worker (Licensed Clinical Social Worker)     Lloyd Huger, MD   05/12/2020 8:40 AM   This encounter was created in error - please disregard.

## 2020-05-08 ENCOUNTER — Other Ambulatory Visit: Payer: Self-pay

## 2020-05-08 DIAGNOSIS — Z1211 Encounter for screening for malignant neoplasm of colon: Secondary | ICD-10-CM

## 2020-05-08 DIAGNOSIS — R634 Abnormal weight loss: Secondary | ICD-10-CM

## 2020-05-08 DIAGNOSIS — Z1212 Encounter for screening for malignant neoplasm of rectum: Secondary | ICD-10-CM

## 2020-05-08 MED ORDER — NA SULFATE-K SULFATE-MG SULF 17.5-3.13-1.6 GM/177ML PO SOLN: ORAL | 0 refills | Status: DC

## 2020-05-08 NOTE — Telephone Encounter (Signed)
Called patient and was able to schedule her colonoscopy and she agreed on 06/06/2020 and her COVID-19 test on 06/04/2020. Patient was instructed that I would be mailing her the prepping instructions and that if she had any questions, to please call me. Patient understood and had no further questions.

## 2020-05-10 ENCOUNTER — Encounter: Payer: Self-pay | Admitting: Oncology

## 2020-05-10 NOTE — Progress Notes (Signed)
Patient was called for pre assessment. She denies any pain or concerns at this time.  

## 2020-05-11 ENCOUNTER — Ambulatory Visit: Payer: Medicare Other

## 2020-05-11 ENCOUNTER — Inpatient Hospital Stay: Payer: Medicare Other | Admitting: Oncology

## 2020-05-15 DIAGNOSIS — N1832 Chronic kidney disease, stage 3b: Secondary | ICD-10-CM | POA: Diagnosis not present

## 2020-05-15 DIAGNOSIS — E872 Acidosis: Secondary | ICD-10-CM | POA: Diagnosis not present

## 2020-05-15 DIAGNOSIS — I10 Essential (primary) hypertension: Secondary | ICD-10-CM | POA: Diagnosis not present

## 2020-05-15 DIAGNOSIS — D631 Anemia in chronic kidney disease: Secondary | ICD-10-CM | POA: Diagnosis not present

## 2020-05-16 ENCOUNTER — Ambulatory Visit: Admission: RE | Admit: 2020-05-16 | Payer: Medicare Other | Source: Ambulatory Visit

## 2020-05-22 ENCOUNTER — Other Ambulatory Visit: Payer: Self-pay | Admitting: Nurse Practitioner

## 2020-05-22 NOTE — Telephone Encounter (Signed)
Requested medication (s) are due for refill today: no  Requested medication (s) are on the active medication list: yes  Last refill:  05/04/2020  Future visit scheduled: yes  Notes to clinic:  this refill cannot be delegated    Requested Prescriptions  Pending Prescriptions Disp Refills   tiZANidine (ZANAFLEX) 4 MG tablet [Pharmacy Med Name: TIZANIDINE HCL 4 MG TAB] 30 tablet 0    Sig: TAKE 1 TABLET BY MOUTH EVERY 6 HOURS AS NEEDED FOR MUSCLE SPASMS      Not Delegated - Cardiovascular:  Alpha-2 Agonists - tizanidine Failed - 05/22/2020  9:17 AM      Failed - This refill cannot be delegated      Passed - Valid encounter within last 6 months    Recent Outpatient Visits           2 weeks ago Abnormal weight loss   [place], [place] T, NP   3 weeks ago Weight loss   [employer], [place] T, NP   1 month ago Schizophrenia, unspecified type ([place])   [place], Jolene T, NP   2 months ago Schizophrenia, unspecified type ([place])   [place], Barbaraann Faster, NP   3 months ago Essential hypertension   [place], Barbaraann Faster, NP       Future Appointments             In 3 weeks Cannady, Barbaraann Faster, NP [facility]   In 4 weeks [place], Barbaraann Faster, NP [facility]   In 6 months  [facility]

## 2020-05-23 ENCOUNTER — Inpatient Hospital Stay: Admission: RE | Admit: 2020-05-23 | Payer: Medicare Other | Source: Ambulatory Visit

## 2020-05-25 ENCOUNTER — Other Ambulatory Visit: Payer: Self-pay | Admitting: Nurse Practitioner

## 2020-05-28 ENCOUNTER — Ambulatory Visit: Admission: RE | Admit: 2020-05-28 | Payer: Medicare Other | Source: Ambulatory Visit

## 2020-05-29 ENCOUNTER — Ambulatory Visit (INDEPENDENT_AMBULATORY_CARE_PROVIDER_SITE_OTHER): Payer: Medicare Other | Admitting: General Practice

## 2020-05-29 ENCOUNTER — Telehealth: Payer: Self-pay | Admitting: General Practice

## 2020-05-29 DIAGNOSIS — R634 Abnormal weight loss: Secondary | ICD-10-CM

## 2020-05-29 DIAGNOSIS — E7800 Pure hypercholesterolemia, unspecified: Secondary | ICD-10-CM

## 2020-05-29 DIAGNOSIS — R630 Anorexia: Secondary | ICD-10-CM

## 2020-05-29 DIAGNOSIS — I10 Essential (primary) hypertension: Secondary | ICD-10-CM | POA: Diagnosis not present

## 2020-05-29 DIAGNOSIS — N1832 Chronic kidney disease, stage 3b: Secondary | ICD-10-CM

## 2020-05-29 DIAGNOSIS — F209 Schizophrenia, unspecified: Secondary | ICD-10-CM

## 2020-05-29 NOTE — Chronic Care Management (AMB) (Signed)
Chronic Care Management   Follow Up Note   05/29/2020 Name: Denise Everett MRN: [id number] DOB: 05-25-1951  Referred by: Venita Lick, NP Reason for referral : Chronic Care Management (RNCM: Follow up Chronic Disease Management and Care Coordination Needs)   Denise Everett is a 69 y.o. year old female who is a primary care patient of Cannady, Barbaraann Faster, NP. The CCM team was consulted for assistance with chronic disease management and care coordination needs.    Review of patient status, including review of consultants reports, relevant laboratory and other test results, and collaboration with appropriate care team members and the patient's provider was performed as part of comprehensive patient evaluation and provision of chronic care management services.    SDOH (Social Determinants of Health) assessments performed: Yes See Care Plan activities for detailed interventions related to [facility])     Outpatient Encounter Medications as of 05/29/2020  Medication Sig  . Aspirin-Acetaminophen-Caffeine (GOODYS EXTRA STRENGTH) [phone] MG PACK Take by mouth.  Marland Kitchen atorvastatin (LIPITOR) 10 MG tablet TAKE 1 TABLET BY MOUTH DAILY AT 6 PM  . famotidine (PEPCID) 20 MG tablet Take 1 tablet (20 mg total) by mouth daily.  Marland Kitchen gabapentin (NEURONTIN) 300 MG capsule Take 1 capsule (300 mg total) by mouth 3 (three) times daily.  Marland Kitchen lisinopril (ZESTRIL) 10 MG tablet TAKE ONE-HALF TABLET BY MOUTH DAILY  . Na Sulfate-K Sulfate-Mg Sulf 17.5-3.13-1.6 GM/177ML SOLN At 5 PM the day before procedure take 1 bottle and 5 hours before procedure take 1 bottle.  . raloxifene (EVISTA) 60 MG tablet TAKE 1 TABLET BY MOUTH EVERY DAY  . risperiDONE (RISPERDAL) 2 MG tablet TAKE 1/2 TO 1 TABLET BY MOUTH AT BEDTIME  . tiZANidine (ZANAFLEX) 4 MG tablet TAKE 1 TABLET BY MOUTH EVERY 6 HOURS AS NEEDED FOR MUSCLE SPASMS   No facility-administered encounter medications on file as of 05/29/2020.     Objective:  BP Readings from Last  3 Encounters:  05/04/20 128/78  04/25/20 108/67  03/28/20 103/67    Goals Addressed              This Visit's Progress   .  RNCM: Pt- "I don't know how to take my blood pressure" (pt-stated)        CARE PLAN ENTRY (see longtitudinal plan of care for additional care plan information)  Current Barriers:  . Chronic Disease Management support, education, and care coordination needs related to HTN, HLD, CKD Stage 3, and Schizophrenia  Clinical Goal(s) related to HTN, HLD, CKD Stage 3, and Schizophrenia :  Over the next 120 days, patient will:  . Work with the care management team to address educational, disease management, and care coordination needs  . Begin or continue self health monitoring activities as directed today  work with the patient on self monitoring of blood pressure and adherence to a heart healthy diet . Call provider office for new or worsened signs and symptoms Blood pressure findings outside established parameters, Shortness of breath, and New or worsened symptom related to HLD/HTN/Kidney function/Schizophrenia and other chronic conditions . Call care management team with questions or concerns . Verbalize basic understanding of patient centered plan of care established today  Interventions related to HTN, HLD, CKD Stage 3, and Schizophrenia :  . Evaluation of current treatment plans and patient's adherence to plan as established by provider.  The patient verbalized compliance with her medications regimen and recommendations by her pcp . Assessed patient understanding of disease states.  The patient said  recently she did not know why she had a decreased appetite but states she is eating better now and wanted the [facility] to let the pcp know. States she only eats one meal a day and sometimes has a snack.  Eats out some and cooks some. 05-29-2020: The patient states she has never had an appetite. She is supposed to have an EGD on 06/06/2020. She says she will do this test but wants  to talk to [facility] at her appointment. She doesn't think she needs all these test done. Explained the need to find out what is going on and why she is having the weight loss.  . Assessed ability to get to procedures. The patient states she was told she needed someone with her but then they tell her she doesn't. Ask her about her brother or sister in law. She says she can ask her sister in law she would do it because the other lady had a car wreck.  Education and support given. Encouraged the patient to reach out to the [facility] for future needs related to transportation and help.  . Assessed patient's education and care coordination needs.  The patient denies any needs at this time but knows resources are available. . Evaluation of heart healthy diet. The patient reviewed some of the things she eats. Will work with the patient on Heart Healthy options. Does not use sodium. Likes to season with pepper. 05-29-2020: The patient states some days she eats good and some days she doesn't. Had gained a pound when she saw the doctor recently. The patient states she has always been like that. Education and support given.  . Provided disease specific education to patient.  The patient denies having headaches. The patient verbalized she did not know how to take her blood pressure. Will see if can work with the patient at her next pcp visit on May 19th to see if this is something she can be taught. The patient has a stable blood pressure at this time. Nash Dimmer with appropriate clinical care team members regarding patient needs.  LCSW referral in place. The patient can benefit from coping skills when she feels down or has a decreased appetite. The patient is receptive to talking to the CCM team.   Patient Self Care Activities related to HTN, HLD, CKD Stage 3, and Schizophrenia :  . Patient is unable to independently self-manage chronic health conditions  Please see past updates related to this goal by clicking on the  "Past Updates" button in the selected goal      .  RNCM: Pt-"I had a fall today" (pt-stated)        CARE PLAN ENTRY (see longitudinal plan of care for additional care plan information)  Current Barriers:  Marland Kitchen Knowledge Deficits related to fall precautions . Decreased adherence to prescribed treatment for fall prevention . Literacy barriers  Clinical Goal(s):  Marland Kitchen Over the next 120 days, patient will demonstrate improved adherence to prescribed treatment plan for decreasing falls as evidenced by patient reporting and review of EMR . Over the next 120 days, patient will verbalize using fall risk reduction strategies discussed . Over the next 120 days, patient will not experience additional falls . Over the next 120 days, patient will work with [facility] and pcp to address needs related to fall risk . Over the next 120 days, patient will attend all scheduled medical appointments: appointment with pcp 03-28-2020.  Interventions:  . Provided written and verbal education re: Potential causes of falls  and Fall prevention strategies . Reviewed medications and discussed potential side effects of medications such as dizziness and frequent urination . Assessed for s/s of orthostatic hypotension . Assessed for falls since last encounter. 05-29-2020: The patient denies any new falls. States she is doing well just "getting old" . Assessed patients knowledge of fall risk prevention secondary to previously provided education. . Assessed working status of life alert bracelet and patient adherence . Provided patient information for fall alert systems . Evaluation of current treatment plan related to falls and patient's adherence to plan as established by provider. . Advised patient to call the pcp for new falls . Provided education to patient re: safety in the home to prevent falls  Patient Self Care Activities:  . Utilize safety when ambulating.  The patient does not use DME(assistive device) appropriately with  all ambulation . De-clutter walkways . Change positions slowly . Wear secure fitting shoes at all times with ambulation . Utilize home lighting for dim lit areas . Have self and pet awareness at all times  Plan: . CCM RN CM will follow up in 60   Please see past updates related to this goal by clicking on the "Past Updates" button in the selected goal          Plan:   The care management team will reach out to the patient again over the next 30 to 60 days.    Noreene Larsson RN, MSN, [facility] Mobile: [phone]

## 2020-05-29 NOTE — Patient Instructions (Signed)
Visit Information  Goals Addressed              This Visit's Progress   .  RNCM: Pt- "I don't know how to take my blood pressure" (pt-stated)        CARE PLAN ENTRY (see longtitudinal plan of care for additional care plan information)  Current Barriers:  . Chronic Disease Management support, education, and care coordination needs related to HTN, HLD, CKD Stage 3, and Schizophrenia  Clinical Goal(s) related to HTN, HLD, CKD Stage 3, and Schizophrenia :  Over the next 120 days, patient will:  . Work with the care management team to address educational, disease management, and care coordination needs  . Begin or continue self health monitoring activities as directed today  work with the patient on self monitoring of blood pressure and adherence to a heart healthy diet . Call provider office for new or worsened signs and symptoms Blood pressure findings outside established parameters, Shortness of breath, and New or worsened symptom related to HLD/HTN/Kidney function/Schizophrenia and other chronic conditions . Call care management team with questions or concerns . Verbalize basic understanding of patient centered plan of care established today  Interventions related to HTN, HLD, CKD Stage 3, and Schizophrenia :  . Evaluation of current treatment plans and patient's adherence to plan as established by provider.  The patient verbalized compliance with her medications regimen and recommendations by her pcp . Assessed patient understanding of disease states.  The patient said recently she did not know why she had a decreased appetite but states she is eating better now and wanted the [facility] to let the pcp know. States she only eats one meal a day and sometimes has a snack.  Eats out some and cooks some. 05-29-2020: The patient states she has never had an appetite. She is supposed to have an EGD on 06/06/2020. She says she will do this test but wants to talk to [facility] at her appointment. She doesn't  think she needs all these test done. Explained the need to find out what is going on and why she is having the weight loss.  . Assessed ability to get to procedures. The patient states she was told she needed someone with her but then they tell her she doesn't. Ask her about her brother or sister in law. She says she can ask her sister in law she would do it because the other lady had a car wreck.  Education and support given. Encouraged the patient to reach out to the [facility] for future needs related to transportation and help.  . Assessed patient's education and care coordination needs.  The patient denies any needs at this time but knows resources are available. . Evaluation of heart healthy diet. The patient reviewed some of the things she eats. Will work with the patient on Heart Healthy options. Does not use sodium. Likes to season with pepper. 05-29-2020: The patient states some days she eats good and some days she doesn't. Had gained a pound when she saw the doctor recently. The patient states she has always been like that. Education and support given.  . Provided disease specific education to patient.  The patient denies having headaches. The patient verbalized she did not know how to take her blood pressure. Will see if can work with the patient at her next pcp visit on May 19th to see if this is something she can be taught. The patient has a stable blood pressure at this time. Marland Kitchen  Collaborated with appropriate clinical care team members regarding patient needs.  LCSW referral in place. The patient can benefit from coping skills when she feels down or has a decreased appetite. The patient is receptive to talking to the CCM team.   Patient Self Care Activities related to HTN, HLD, CKD Stage 3, and Schizophrenia :  . Patient is unable to independently self-manage chronic health conditions  Please see past updates related to this goal by clicking on the "Past Updates" button in the selected goal      .   RNCM: Pt-"I had a fall today" (pt-stated)        CARE PLAN ENTRY (see longitudinal plan of care for additional care plan information)  Current Barriers:  Marland Kitchen Knowledge Deficits related to fall precautions . Decreased adherence to prescribed treatment for fall prevention . Literacy barriers  Clinical Goal(s):  Marland Kitchen Over the next 120 days, patient will demonstrate improved adherence to prescribed treatment plan for decreasing falls as evidenced by patient reporting and review of EMR . Over the next 120 days, patient will verbalize using fall risk reduction strategies discussed . Over the next 120 days, patient will not experience additional falls . Over the next 120 days, patient will work with [facility] and pcp to address needs related to fall risk . Over the next 120 days, patient will attend all scheduled medical appointments: appointment with pcp 03-28-2020.  Interventions:  . Provided written and verbal education re: Potential causes of falls and Fall prevention strategies . Reviewed medications and discussed potential side effects of medications such as dizziness and frequent urination . Assessed for s/s of orthostatic hypotension . Assessed for falls since last encounter. 05-29-2020: The patient denies any new falls. States she is doing well just "getting old" . Assessed patients knowledge of fall risk prevention secondary to previously provided education. . Assessed working status of life alert bracelet and patient adherence . Provided patient information for fall alert systems . Evaluation of current treatment plan related to falls and patient's adherence to plan as established by provider. . Advised patient to call the pcp for new falls . Provided education to patient re: safety in the home to prevent falls  Patient Self Care Activities:  . Utilize safety when ambulating.  The patient does not use DME(assistive device) appropriately with all ambulation . De-clutter walkways . Change  positions slowly . Wear secure fitting shoes at all times with ambulation . Utilize home lighting for dim lit areas . Have self and pet awareness at all times  Plan: . CCM RN CM will follow up in 60   Please see past updates related to this goal by clicking on the "Past Updates" button in the selected goal         Patient verbalizes understanding of instructions provided today.   The care management team will reach out to the patient again over the next 30 to 60 days.   Noreene Larsson RN, MSN, [facility] Mobile: [phone]

## 2020-06-01 ENCOUNTER — Other Ambulatory Visit: Payer: Self-pay | Admitting: Nurse Practitioner

## 2020-06-01 NOTE — Telephone Encounter (Signed)
Requested medication (s) are due for refill today: no  Requested medication (s) are on the active medication list: yes  Last refill: 05/22/2020  Future visit scheduled: yes  Notes to clinic:  this refill cannot be delegated    Requested Prescriptions  Pending Prescriptions Disp Refills   tiZANidine (ZANAFLEX) 4 MG tablet [Pharmacy Med Name: TIZANIDINE HCL 4 MG TAB] 30 tablet 0    Sig: TAKE 1 TABLET BY MOUTH EVERY 6 HOURS AS NEEDED FOR MUSCLE SPASMS      Not Delegated - Cardiovascular:  Alpha-2 Agonists - tizanidine Failed - 06/01/2020 12:21 PM      Failed - This refill cannot be delegated      Passed - Valid encounter within last 6 months    Recent Outpatient Visits           4 weeks ago Abnormal weight loss   [place], [place] T, NP   1 month ago Weight loss   [employer], [place] T, NP   2 months ago Schizophrenia, unspecified type ([place])   [place], Jolene T, NP   3 months ago Schizophrenia, unspecified type ([place])   [place], Barbaraann Faster, NP   3 months ago Essential hypertension   [place], Barbaraann Faster, NP       Future Appointments             In 2 weeks Cannady, Barbaraann Faster, NP [facility]   In 2 weeks [place], Barbaraann Faster, NP [facility]   In 5 months  [facility]

## 2020-06-04 ENCOUNTER — Other Ambulatory Visit: Payer: Medicare Other | Attending: Gastroenterology

## 2020-06-04 ENCOUNTER — Ambulatory Visit: Payer: Self-pay | Admitting: Nurse Practitioner

## 2020-06-06 ENCOUNTER — Ambulatory Visit: Admission: RE | Admit: 2020-06-06 | Payer: Medicare Other | Source: Home / Self Care | Admitting: Gastroenterology

## 2020-06-06 ENCOUNTER — Encounter: Admission: RE | Payer: Self-pay | Source: Home / Self Care

## 2020-06-06 SURGERY — ESOPHAGOGASTRODUODENOSCOPY (EGD) WITH PROPOFOL
Anesthesia: General

## 2020-06-15 ENCOUNTER — Telehealth: Payer: Self-pay

## 2020-06-18 ENCOUNTER — Ambulatory Visit: Payer: Self-pay | Admitting: Nurse Practitioner

## 2020-06-19 ENCOUNTER — Other Ambulatory Visit: Payer: Self-pay | Admitting: Family Medicine

## 2020-06-19 NOTE — Telephone Encounter (Signed)
Requested medication (s) are due for refill today: yes  Requested medication (s) are on the active medication list: yes  Last refill:  06/01/2020  Future visit scheduled: yes  Notes to clinic:  This refill cannot be delegated   Requested Prescriptions  Pending Prescriptions Disp Refills   tiZANidine (ZANAFLEX) 4 MG tablet [Pharmacy Med Name: TIZANIDINE HCL 4 MG TAB] 30 tablet 0    Sig: TAKE 1 TABLET BY MOUTH EVERY 6 HOURS AS NEEDED FOR MUSCLE SPASMS      Not Delegated - Cardiovascular:  Alpha-2 Agonists - tizanidine Failed - 06/19/2020  9:17 AM      Failed - This refill cannot be delegated      Passed - Valid encounter within last 6 months    Recent Outpatient Visits           1 month ago Abnormal weight loss   [place], [place] T, NP   1 month ago Weight loss   [employer], [place] T, NP   2 months ago Schizophrenia, unspecified type ([place])   [place], Jolene T, NP   3 months ago Schizophrenia, unspecified type ([place])   [place], Barbaraann Faster, NP   3 months ago Essential hypertension   [place], Barbaraann Faster, NP       Future Appointments             Tomorrow Venita Lick, NP [place]   In 5 months  [facility]

## 2020-06-20 ENCOUNTER — Ambulatory Visit: Payer: Medicare Other | Admitting: Nurse Practitioner

## 2020-06-20 ENCOUNTER — Other Ambulatory Visit: Payer: Self-pay

## 2020-07-06 ENCOUNTER — Other Ambulatory Visit: Payer: Self-pay

## 2020-07-06 ENCOUNTER — Ambulatory Visit (INDEPENDENT_AMBULATORY_CARE_PROVIDER_SITE_OTHER): Payer: Medicare Other | Admitting: Nurse Practitioner

## 2020-07-06 ENCOUNTER — Encounter: Payer: Self-pay | Admitting: Nurse Practitioner

## 2020-07-06 VITALS — BP 117/78 | HR 55 | Temp 97.7°F | Wt 118.0 lb

## 2020-07-06 DIAGNOSIS — Z87891 Personal history of nicotine dependence: Secondary | ICD-10-CM

## 2020-07-06 DIAGNOSIS — D472 Monoclonal gammopathy: Secondary | ICD-10-CM

## 2020-07-06 DIAGNOSIS — D693 Immune thrombocytopenic purpura: Secondary | ICD-10-CM | POA: Diagnosis not present

## 2020-07-06 DIAGNOSIS — R634 Abnormal weight loss: Secondary | ICD-10-CM

## 2020-07-06 DIAGNOSIS — F209 Schizophrenia, unspecified: Secondary | ICD-10-CM

## 2020-07-06 MED ORDER — TIZANIDINE HCL 4 MG PO TABS: ORAL_TABLET | ORAL | 0 refills | Status: DC

## 2020-07-06 MED ORDER — MIRTAZAPINE 7.5 MG PO TABS: 7.5000 mg | ORAL_TABLET | Freq: Every day | ORAL | 3 refills | Status: DC

## 2020-07-06 NOTE — Progress Notes (Signed)
BP 117/78    Pulse (!) 55    Temp 97.7 F (36.5 C) (Oral)    Wt 118 lb (53.5 kg)    LMP  (LMP Unknown)    SpO2 100%    BMI 23.17 kg/m    Subjective:    Patient ID: Denise Everett, female    DOB: 1951-04-02, 69 y.o.   MRN: [id number]  HPI: Denise Everett is a 69 y.o. female  Chief Complaint  Patient presents with   Weight Check    pt states had not been having good apetite   Mood   SCHIZOPHRENIA: Reports this is well-controlled with her medication.Prior to medication she had auditory hallucinations. Diagnosed about 20 years ago. Denies any behaviors or auditory hallucinations.   Depression screen [facility] 2/9 07/06/2020 03/28/2020 03/06/2020 11/16/2019 11/11/2018  Decreased Interest 0 0 0 0 0  Down, Depressed, Hopeless 0 0 0 0 0  PHQ - 2 Score 0 0 0 0 0  Altered sleeping 0 0 - - -  Tired, decreased energy 0 0 - - -  Change in appetite 0 0 - - -  Feeling bad or failure about yourself  0 0 - - -  Trouble concentrating 0 0 - - -  Moving slowly or fidgety/restless 0 0 - - -  Suicidal thoughts 0 0 - - -  PHQ-9 Score 0 0 - - -  Difficult doing work/chores - Not difficult at all - - -   GAD 7 : Generalized Anxiety Score 07/06/2020 03/28/2020  Nervous, Anxious, on Edge 0 0  Control/stop worrying 0 0  Worry too much - different things 0 0  Trouble relaxing 0 0  Restless 0 0  Easily annoyed or irritable 0 0  Afraid - awful might happen 0 0  Total GAD 7 Score 0 0  Anxiety Difficulty - Not difficult at all     WEIGHT LOSS: Has had 1 pound loss since last visit(19 pound loss since 01/28/19-- weighed 137 lbs at that visit), reportsher appetite is decreased.Currently eating two meals a day, more than previous, does eat snacks in between.Takes occasional TUMS for heart burn. Has not had colonoscopy, reports she was told due to not having help to pick her up she could not go.  She saw GI on 05/01/20.  Did not go for abdominal CT as ordered, reports it was too last minute.   Denies  SOB or CP. History of smoking, started at 13 and quit 4 years ago. Had initial lung CT screening in 2018 which showed emphysema and was to repeat in 12 months, missed screening-- she was to go for low dose CT 05/03/20 and missed this. IFOBT negative 02/16/20. Followed by Dr. Grayland Ormond for MGUS and ITP, but has not seen him since 11/24/17 -- last CBC inMay 2021 PLT 87 and 05/15/20 with nephrology they were 81and she isto follow-up with oncology, missed appointment July 2nd.Denies fatigue, night sweats, or fever. Fever: no Nausea: no Vomiting: none Weight loss: yes Decreased appetite: yes Diarrhea: no Constipation: no Blood in stool: no Heartburn: yes Jaundice: no Rash: no Dysuria/urinary frequency: no Hematuria: no History of sexually transmitted disease: no Recurrent NSAID use: no  Relevant past medical, surgical, family and social history reviewed and updated as indicated. Interim medical history since our last visit reviewed. Allergies and medications reviewed and updated.  Review of Systems  Constitutional: Positive for appetite change and unexpected weight change. Negative for activity change, diaphoresis, fatigue and fever.  Respiratory: Negative  for cough, chest tightness, shortness of breath and wheezing.   Cardiovascular: Negative for chest pain, palpitations and leg swelling.  Gastrointestinal: Negative for abdominal distention, abdominal pain, blood in stool, constipation, diarrhea, nausea and vomiting.  Neurological: Negative.   Psychiatric/Behavioral: Negative for decreased concentration, self-injury, sleep disturbance and suicidal ideas. The patient is not nervous/anxious.     Per HPI unless specifically indicated above     Objective:    BP 117/78    Pulse (!) 55    Temp 97.7 F (36.5 C) (Oral)    Wt 118 lb (53.5 kg)    LMP  (LMP Unknown)    SpO2 100%    BMI 23.17 kg/m   Wt Readings from Last 3 Encounters:  07/06/20 118 lb (53.5 kg)  05/04/20 119 lb 6.4 oz (54.2  kg)  04/25/20 123 lb 6.4 oz (56 kg)    Physical Exam Vitals and nursing note reviewed.  Constitutional:      General: She is awake. She is not in acute distress.    Appearance: She is well-developed and well-groomed. She is not ill-appearing.  HENT:     Head: Normocephalic.     Right Ear: Hearing normal.     Left Ear: Hearing normal.     Nose: Nose normal.     Mouth/Throat:     Mouth: Mucous membranes are moist.  Eyes:     General: Lids are normal.        Right eye: No discharge.        Left eye: No discharge.     Conjunctiva/sclera: Conjunctivae normal.     Pupils: Pupils are equal, round, and reactive to light.  Neck:     Thyroid: No thyromegaly.     Vascular: No carotid bruit.  Cardiovascular:     Rate and Rhythm: Normal rate and regular rhythm.     Heart sounds: Normal heart sounds. No murmur heard.  No gallop.   Pulmonary:     Effort: Pulmonary effort is normal. No accessory muscle usage or respiratory distress.     Breath sounds: Normal breath sounds.  Abdominal:     General: Bowel sounds are normal. There is no distension or abdominal bruit.     Palpations: Abdomen is soft. There is no hepatomegaly or splenomegaly.     Tenderness: There is no abdominal tenderness.     Hernia: No hernia is present.  Musculoskeletal:     Cervical back: Normal range of motion and neck supple.     Right lower leg: No edema.     Left lower leg: No edema.  Lymphadenopathy:     Head:     Right side of head: No submental, submandibular, tonsillar, preauricular or posterior auricular adenopathy.     Left side of head: No submental, submandibular, tonsillar, preauricular or posterior auricular adenopathy.     Cervical: No cervical adenopathy.  Skin:    General: Skin is warm and dry.  Neurological:     Mental Status: She is alert and oriented to person, place, and time.  Psychiatric:        Attention and Perception: Attention normal.        Mood and Affect: Mood normal.        Speech:  Speech normal.        Behavior: Behavior normal. Behavior is cooperative.    Results for orders placed or performed in visit on 03/28/20  CBC with Differential/Platelet  Result Value Ref Range   WBC 5.7 3.4 -  10.8 x10E3/uL   RBC 3.53 (L) 3.77 - 5.28 x10E6/uL   Hemoglobin 10.3 (L) 11.1 - 15.9 g/dL   Hematocrit 32.5 (L) 34.0 - 46.6 %   MCV 92 79 - 97 fL   MCH 29.2 26.6 - 33.0 pg   MCHC 31.7 31 - 35 g/dL   RDW 14.8 11.7 - 15.4 %   Platelets 87 (LL) 150 - 450 x10E3/uL   Neutrophils 51 Not Estab. %   Lymphs 36 Not Estab. %   Monocytes 8 Not Estab. %   Eos 3 Not Estab. %   Basos 1 Not Estab. %   Neutrophils Absolute 3.0 1 - 7 x10E3/uL   Lymphocytes Absolute 2.0 0 - 3 x10E3/uL   Monocytes Absolute 0.4 0 - 0 x10E3/uL   EOS (ABSOLUTE) 0.2 0.0 - 0.4 x10E3/uL   Basophils Absolute 0.0 0 - 0 x10E3/uL   Immature Granulocytes 1 Not Estab. %   Immature Grans (Abs) 0.0 0.0 - 0.1 x10E3/uL   Hematology Comments: Note:   TSH  Result Value Ref Range   TSH 0.533 0.450 - 4.500 uIU/mL  Hepatic function panel  Result Value Ref Range   Total Protein 6.6 6.0 - 8.5 g/dL   Albumin 4.0 3.7 - 4.7 g/dL   Bilirubin Total <0.2 0.0 - 1.2 mg/dL   Bilirubin, Direct 0.06 0.00 - 0.40 mg/dL   Alkaline Phosphatase 48 48 - 121 IU/L   AST 9 0 - 40 IU/L   ALT <5 0 - 32 IU/L      Assessment & Plan:   Problem List Items Addressed This Visit      Musculoskeletal and Integument   Idiopathic thrombocytopenic purpura (HCC) (Chronic)    Continue to collaborate with hematology, urgent referral to return.  She denies bleeding or increased bruising.        Other   Schizophrenia ([place]) - Primary    Chronic, ongoing.  Continue current medication regimen, Risperdal, and adjust as needed.  Has been on this regimen for years with benefit.      MGUS (monoclonal gammopathy of unknown significance)    Discussed at length with patient need for consistent follow-up with hematology, urgent referral placed to return for  visit, especially in presence of recent weight loss and decreased appetite.  Continue to collaborate with CCM team on transportation + connected care.  CBC up to date with nephrology recently.  Return in 6 weeks.      Relevant Orders   Ambulatory referral to Oncology   Personal history of tobacco use, presenting hazards to health    Lung CT order placed due to current weight loss and appetite changes, discussed at length with patient need to obtain this.  Recommend continued cessation of smoking.      Abnormal weight loss    Ongoing weight loss present.  She denies any symptoms at this time, no B symptoms.  Has diagnosis of underlying MGUS and ITP, urgent referral back to oncology.  Continue collaboration with GI due to weight loss and history of smoking no previous colonoscopy -- had negative iFOBT, but may benefit from colonoscopy. Appreciate their input.  Due to ongoing loss and early satiety will also obtain CT abdomen/pelvis, ordered and highly recommend she attend appointment for this.  Will reach out to CCM team to assist with transportation for this patient. Return in 6 weeks.      Relevant Orders   Ambulatory referral to Oncology   CT Abdomen Pelvis Wo Contrast   CT Chest  Wo Contrast       Follow up plan: Return in about 6 weeks (around 08/17/2020) for Weight check, HTN/HLD.

## 2020-07-06 NOTE — Assessment & Plan Note (Signed)
Lung CT order placed due to current weight loss and appetite changes, discussed at length with patient need to obtain this.  Recommend continued cessation of smoking.

## 2020-07-06 NOTE — Assessment & Plan Note (Signed)
Chronic, ongoing.  Continue current medication regimen, Risperdal, and adjust as needed.  Has been on this regimen for years with benefit.

## 2020-07-06 NOTE — Assessment & Plan Note (Signed)
Ongoing weight loss present.  She denies any symptoms at this time, no B symptoms.  Has diagnosis of underlying MGUS and ITP, urgent referral back to oncology.  Continue collaboration with GI due to weight loss and history of smoking no previous colonoscopy -- had negative iFOBT, but may benefit from colonoscopy. Appreciate their input.  Due to ongoing loss and early satiety will also obtain CT abdomen/pelvis, ordered and highly recommend she attend appointment for this.  Will reach out to CCM team to assist with transportation for this patient. Return in 6 weeks.

## 2020-07-06 NOTE — Assessment & Plan Note (Signed)
Continue to collaborate with hematology, urgent referral to return.  She denies bleeding or increased bruising.

## 2020-07-06 NOTE — Patient Instructions (Signed)
DASH Eating Plan DASH stands for "Dietary Approaches to Stop Hypertension." The DASH eating plan is a healthy eating plan that has been shown to reduce high blood pressure (hypertension). It may also reduce your risk for type 2 diabetes, heart disease, and stroke. The DASH eating plan may also help with weight loss. What are tips for following this plan?  General guidelines  Avoid eating more than 2,300 mg (milligrams) of salt (sodium) a day. If you have hypertension, you may need to reduce your sodium intake to 1,500 mg a day.  Limit alcohol intake to no more than 1 drink a day for nonpregnant women and 2 drinks a day for men. One drink equals 12 oz of beer, 5 oz of wine, or 1 oz of hard liquor.  Work with your health care provider to maintain a healthy body weight or to lose weight. Ask what an ideal weight is for you.  Get at least 30 minutes of exercise that causes your heart to beat faster (aerobic exercise) most days of the week. Activities may include walking, swimming, or biking.  Work with your health care provider or diet and nutrition specialist (dietitian) to adjust your eating plan to your individual calorie needs. Reading food labels   Check food labels for the amount of sodium per serving. Choose foods with less than 5 percent of the Daily Value of sodium. Generally, foods with less than 300 mg of sodium per serving fit into this eating plan.  To find whole grains, look for the word "whole" as the first word in the ingredient list. Shopping  Buy products labeled as "low-sodium" or "no salt added."  Buy fresh foods. Avoid canned foods and premade or frozen meals. Cooking  Avoid adding salt when cooking. Use salt-free seasonings or herbs instead of table salt or sea salt. Check with your health care provider or pharmacist before using salt substitutes.  Do not fry foods. Cook foods using healthy methods such as baking, boiling, grilling, and broiling instead.  Cook with  heart-healthy oils, such as olive, canola, soybean, or sunflower oil. Meal planning  Eat a balanced diet that includes: ? 5 or more servings of fruits and vegetables each day. At each meal, try to fill half of your plate with fruits and vegetables. ? Up to 6-8 servings of whole grains each day. ? Less than 6 oz of lean meat, poultry, or fish each day. A 3-oz serving of meat is about the same size as a deck of cards. One egg equals 1 oz. ? 2 servings of low-fat dairy each day. ? A serving of nuts, seeds, or beans 5 times each week. ? Heart-healthy fats. Healthy fats called Omega-3 fatty acids are found in foods such as flaxseeds and coldwater fish, like sardines, salmon, and mackerel.  Limit how much you eat of the following: ? Canned or prepackaged foods. ? Food that is high in trans fat, such as fried foods. ? Food that is high in saturated fat, such as fatty meat. ? Sweets, desserts, sugary drinks, and other foods with added sugar. ? Full-fat dairy products.  Do not salt foods before eating.  Try to eat at least 2 vegetarian meals each week.  Eat more home-cooked food and less restaurant, buffet, and fast food.  When eating at a restaurant, ask that your food be prepared with less salt or no salt, if possible. What foods are recommended? The items listed may not be a complete list. Talk with your dietitian about   what dietary choices are best for you. Grains Whole-grain or whole-wheat bread. Whole-grain or whole-wheat pasta. Brown rice. Oatmeal. Quinoa. Bulgur. Whole-grain and low-sodium cereals. Pita bread. Low-fat, low-sodium crackers. Whole-wheat flour tortillas. Vegetables Fresh or frozen vegetables (raw, steamed, roasted, or grilled). Low-sodium or reduced-sodium tomato and vegetable juice. Low-sodium or reduced-sodium tomato sauce and tomato paste. Low-sodium or reduced-sodium canned vegetables. Fruits All fresh, dried, or frozen fruit. Canned fruit in natural juice (without  added sugar). Meat and other protein foods Skinless chicken or turkey. Ground chicken or turkey. Pork with fat trimmed off. Fish and seafood. Egg whites. Dried beans, peas, or lentils. Unsalted nuts, nut butters, and seeds. Unsalted canned beans. Lean cuts of beef with fat trimmed off. Low-sodium, lean deli meat. Dairy Low-fat (1%) or fat-free (skim) milk. Fat-free, low-fat, or reduced-fat cheeses. Nonfat, low-sodium ricotta or cottage cheese. Low-fat or nonfat yogurt. Low-fat, low-sodium cheese. Fats and oils Soft margarine without trans fats. Vegetable oil. Low-fat, reduced-fat, or light mayonnaise and salad dressings (reduced-sodium). Canola, safflower, olive, soybean, and sunflower oils. Avocado. Seasoning and other foods Herbs. Spices. Seasoning mixes without salt. Unsalted popcorn and pretzels. Fat-free sweets. What foods are not recommended? The items listed may not be a complete list. Talk with your dietitian about what dietary choices are best for you. Grains Baked goods made with fat, such as croissants, muffins, or some breads. Dry pasta or rice meal packs. Vegetables Creamed or fried vegetables. Vegetables in a cheese sauce. Regular canned vegetables (not low-sodium or reduced-sodium). Regular canned tomato sauce and paste (not low-sodium or reduced-sodium). Regular tomato and vegetable juice (not low-sodium or reduced-sodium). Pickles. Olives. Fruits Canned fruit in a light or heavy syrup. Fried fruit. Fruit in cream or butter sauce. Meat and other protein foods Fatty cuts of meat. Ribs. Fried meat. Bacon. Sausage. Bologna and other processed lunch meats. Salami. Fatback. Hotdogs. Bratwurst. Salted nuts and seeds. Canned beans with added salt. Canned or smoked fish. Whole eggs or egg yolks. Chicken or turkey with skin. Dairy Whole or 2% milk, cream, and half-and-half. Whole or full-fat cream cheese. Whole-fat or sweetened yogurt. Full-fat cheese. Nondairy creamers. Whipped toppings.  Processed cheese and cheese spreads. Fats and oils Butter. Stick margarine. Lard. Shortening. Ghee. Bacon fat. Tropical oils, such as coconut, palm kernel, or palm oil. Seasoning and other foods Salted popcorn and pretzels. Onion salt, garlic salt, seasoned salt, table salt, and sea salt. Worcestershire sauce. Tartar sauce. Barbecue sauce. Teriyaki sauce. Soy sauce, including reduced-sodium. Steak sauce. Canned and packaged gravies. Fish sauce. Oyster sauce. Cocktail sauce. Horseradish that you find on the shelf. Ketchup. Mustard. Meat flavorings and tenderizers. Bouillon cubes. Hot sauce and Tabasco sauce. Premade or packaged marinades. Premade or packaged taco seasonings. Relishes. Regular salad dressings. Where to find more information:  National Heart, Lung, and Blood Institute: www.nhlbi.nih.gov  American Heart Association: www.heart.org Summary  The DASH eating plan is a healthy eating plan that has been shown to reduce high blood pressure (hypertension). It may also reduce your risk for type 2 diabetes, heart disease, and stroke.  With the DASH eating plan, you should limit salt (sodium) intake to 2,300 mg a day. If you have hypertension, you may need to reduce your sodium intake to 1,500 mg a day.  When on the DASH eating plan, aim to eat more fresh fruits and vegetables, whole grains, lean proteins, low-fat dairy, and heart-healthy fats.  Work with your health care provider or diet and nutrition specialist (dietitian) to adjust your eating plan to your   individual calorie needs. This information is not intended to replace advice given to you by your health care provider. Make sure you discuss any questions you have with your health care provider. Document Revised: 10/09/2017 Document Reviewed: 10/20/2016 Elsevier Patient Education  2020 Elsevier Inc.  

## 2020-07-06 NOTE — Assessment & Plan Note (Signed)
Discussed at length with patient need for consistent follow-up with hematology, urgent referral placed to return for visit, especially in presence of recent weight loss and decreased appetite.  Continue to collaborate with CCM team on transportation + connected care.  CBC up to date with nephrology recently.  Return in 6 weeks.

## 2020-07-10 ENCOUNTER — Telehealth: Payer: Self-pay | Admitting: Nurse Practitioner

## 2020-07-10 NOTE — Telephone Encounter (Signed)
   SF 07/10/2020   Name: Denise Everett   MRN: [id number]   DOB: 1951-11-10   AGE: 69 y.o.   GENDER: female   PCP Venita Lick, NP.   Attempted to call patient twice regarding referral for transportation, but patient did not answer. Could not leave voicemail due to not being set up.   Follow up on: 07/11/2020  [place], Care Management Phone: [phone] Email: [email or web address]

## 2020-07-10 NOTE — Telephone Encounter (Signed)
Spoke with Sharyn Lull at [employer] for transportation. Sharyn Lull stated that they will have availability for transportation on July 19, 2020 at 1pm for CT appointment, and they can get the patient to her appointment between 12:30pm - 12:45pm. Sharyn Lull completed referral for patient. She stated that she will need to know if the patient will be riding alone and if she will be using a wheelchair or walker. Informed Sharyn Lull that Care Guide will try to contact patient to get that information and give her a call back. Sharyn Lull stated understanding.

## 2020-07-11 ENCOUNTER — Other Ambulatory Visit: Payer: Self-pay | Admitting: Nurse Practitioner

## 2020-07-11 ENCOUNTER — Telehealth: Payer: Self-pay | Admitting: General Practice

## 2020-07-11 NOTE — Telephone Encounter (Signed)
  Chronic Care Management   Note  07/11/2020 Name: Denise Everett MRN: [id number] DOB: 1950-12-20  Outgoing call to the patient. Spoke to the patient and advised the patient that the care guide Epimenio Foot was going to call her to set up transportation for her test next week and to be by the phone so she could help her get transportation for her test. The patient verbalized understanding and will talk to the care guide to arrange transportation for her CT scan next week.   Follow up plan: The care management team will reach out to the patient again over the next 30 days.   Noreene Larsson RN, MSN, [facility] Mobile: [phone]

## 2020-07-11 NOTE — Telephone Encounter (Signed)
Requested medication (s) are due for refill today: Yes  Requested medication (s) are on the active medication list: Yes  Last refill:  02/02/20  Future visit scheduled: Yes  Notes to clinic:  See request.    Requested Prescriptions  Pending Prescriptions Disp Refills   risperiDONE (RISPERDAL) 2 MG tablet [Pharmacy Med Name: RISPERIDONE 2 MG TAB] 30 tablet 5    Sig: TAKE 1/2 TO 1 TABLET BY MOUTH AT BEDTIME      Not Delegated - Psychiatry:  Antipsychotics - Second Generation (Atypical) - risperidone Failed - 07/11/2020  2:32 PM      Failed - This refill cannot be delegated      Failed - Prolactin Level (serum) in normal range and within 180 days    No results found for: PROLACTIN, TOTPROLACTIN, LABPROL        Passed - ALT in normal range and within 180 days    ALT  Date Value Ref Range Status  03/28/2020 <5 0 - 32 IU/L Final    Comment:    **Verified by repeat analysis**          Passed - AST in normal range and within 180 days    AST  Date Value Ref Range Status  03/28/2020 9 0 - 40 IU/L Final          Passed - Valid encounter within last 6 months    Recent Outpatient Visits           5 days ago Schizophrenia, unspecified type ([place])   [place], Jolene T, NP   2 months ago Abnormal weight loss   [employer], [place] T, NP   2 months ago Weight loss   [employer], [place] T, NP   3 months ago Schizophrenia, unspecified type ([employer])   [place], Jolene T, NP   4 months ago Schizophrenia, unspecified type ([place])   [place], Barbaraann Faster, NP       Future Appointments             In 1 month [place], Barbaraann Faster, NP [facility]   In 4 months  [facility] [facility]

## 2020-07-11 NOTE — Telephone Encounter (Signed)
   SF 07/11/2020   Name: DERIANA VANDERHOEF   MRN: [id number]   DOB: 02-22-1951   AGE: 69 y.o.   GENDER: female   PCP Venita Lick, NP.   Called pt regarding [employer] Referral for transportation. Gave patient information for scheduled transportation with [employer] [facility]. Patient stated understanding. Also asked patient if she will be traveling alone and if she needs a walker or a cane. Patient stated that she will be traveling alone and she does not use a walker or a cane. Informed patient that someone from the [place] program will be giving her a call the day before or the morning of to confirm her ride. Patient stated understanding. Also spoke with Sharyn Lull at [facility] program to let her know that Ms. Breeding will be traveling alone and does not use a walker or a wheelchair. Sharyn Lull stated understanding and updated the referral for Ms. Lindley Magnus stated that her ride is scheduled for July 19, 2020 and she will be picked up around 12:30. No additional needs at this time.   Closing referral pending any other needs of patient.      [place], Care Management Phone: [phone] Email: [email or web address]

## 2020-07-17 ENCOUNTER — Telehealth: Payer: Self-pay | Admitting: General Practice

## 2020-07-17 ENCOUNTER — Telehealth: Payer: Self-pay

## 2020-07-17 NOTE — Telephone Encounter (Signed)
°  Chronic Care Management   Outreach Note  07/17/2020 Name: Denise Everett MRN: [id number] DOB: [birth year]  Referred by: Venita Lick, NP Reason for referral : Chronic Care Management (RNCM Follow up call for Chron Disease Management and Care Corodination Needs)   An unsuccessful telephone outreach was attempted today. The patient was referred to the case management team for assistance with care management and care coordination.   Follow Up Plan: Face to Face appointment with care management team member scheduled for:  08-17-2020 at 240pm  [place], MSN, [facility] Mobile: [phone]

## 2020-07-18 DIAGNOSIS — W19XXXA Unspecified fall, initial encounter: Secondary | ICD-10-CM | POA: Diagnosis not present

## 2020-07-18 DIAGNOSIS — R531 Weakness: Secondary | ICD-10-CM | POA: Diagnosis not present

## 2020-07-19 ENCOUNTER — Ambulatory Visit: Admission: RE | Admit: 2020-07-19 | Payer: Medicare Other | Source: Ambulatory Visit

## 2020-07-23 ENCOUNTER — Telehealth: Payer: Self-pay

## 2020-08-03 ENCOUNTER — Telehealth: Payer: Self-pay

## 2020-08-06 ENCOUNTER — Ambulatory Visit (INDEPENDENT_AMBULATORY_CARE_PROVIDER_SITE_OTHER): Payer: Medicare Other | Admitting: Nurse Practitioner

## 2020-08-06 ENCOUNTER — Encounter: Payer: Self-pay | Admitting: Nurse Practitioner

## 2020-08-06 ENCOUNTER — Other Ambulatory Visit: Payer: Self-pay | Admitting: Nurse Practitioner

## 2020-08-06 ENCOUNTER — Other Ambulatory Visit: Payer: Self-pay

## 2020-08-06 VITALS — BP 118/81 | HR 84 | Temp 98.4°F | Resp 16 | Ht 59.5 in | Wt 115.6 lb

## 2020-08-06 DIAGNOSIS — R8281 Pyuria: Secondary | ICD-10-CM | POA: Diagnosis not present

## 2020-08-06 DIAGNOSIS — R8271 Bacteriuria: Secondary | ICD-10-CM | POA: Diagnosis not present

## 2020-08-06 DIAGNOSIS — R399 Unspecified symptoms and signs involving the genitourinary system: Secondary | ICD-10-CM | POA: Diagnosis not present

## 2020-08-06 DIAGNOSIS — M545 Low back pain, unspecified: Secondary | ICD-10-CM

## 2020-08-06 DIAGNOSIS — Z23 Encounter for immunization: Secondary | ICD-10-CM | POA: Diagnosis not present

## 2020-08-06 MED ORDER — SULFAMETHOXAZOLE-TRIMETHOPRIM 800-160 MG PO TABS: 1.0000 | ORAL_TABLET | Freq: Two times a day (BID) | ORAL | 0 refills | Status: DC

## 2020-08-06 MED ORDER — LIDOCAINE 5 % EX PTCH: 1.0000 | MEDICATED_PATCH | CUTANEOUS | 0 refills | Status: DC

## 2020-08-06 MED ORDER — DICLOFENAC SODIUM 1 % EX GEL: 4.0000 g | Freq: Four times a day (QID) | CUTANEOUS | 1 refills | Status: DC

## 2020-08-06 NOTE — Addendum Note (Signed)
Addended by: Noemi Chapel A on: 08/06/2020 04:48 PM   Modules accepted: Orders

## 2020-08-06 NOTE — Progress Notes (Signed)
Established patient visit   Patient: Denise Everett   DOB: 01-29-1951   69 y.o. Female  MRN: [id number] Visit Date: 08/06/2020  Today's healthcare provider: Eulogio Bear, NP   Chief Complaint  Patient presents with  . Back Pain   Subjective    HPI  Patient here today C/O right low back pain radiating to hip. Patient reports falling 3 times in the last few weeks. Patient reports tizanidine is not helping with pain.  BACK PAIN Patient reports back pain after Duration: 1 week Mechanism of injury: fall Location: right side and radiates to side Onset: sudden Severity: high Quality: throbbing Frequency: constant Radiation: right side Aggravating factors: moving, walking, movement Alleviating factors: laying on right side Status: stable Treatments attempted: Tylenol, tizanidine   Relief with NSAIDs?: mild Nighttime pain:  yes Paresthesias / decreased sensation:  no Bowel / bladder incontinence:  no Fevers:  no Dysuria / urinary frequency:  no    Patient Active Problem List   Diagnosis Date Noted  . Acute right-sided low back pain without sciatica 08/06/2020  . Abnormal weight loss 03/28/2020  . CKD (chronic kidney disease) stage 3, GFR 30-59 ml/min 02/29/2020  . Hip tendonitis, left 06/03/2017  . Positive urine drug screen (02/24/17) (+ THC) 04/16/2017  . Osteoarthritis of lumbar spine 04/15/2017  . Lumbar facet arthropathy 04/15/2017  . Lumbar facet syndrome (Bilateral) (R>L) 04/15/2017  . Vitamin D insufficiency 03/18/2017  . Marijuana use 03/03/2017  . Chronic pain syndrome 02/24/2017  . Chronic ankle pain  (Location of Tertiary source of pain) (Bilateral) (R>L) 02/24/2017  . Abnormal MRI, lumbar spine (06/22/2014) 02/24/2017  . Lumbar central spinal stenosis (Severe at L3-4) 02/24/2017  . Lumbar foraminal stenosis (Severe: Left L3-4; Right L4-5; Bilateral L5-S1) 02/24/2017  . Lumbar lateral recess stenosis (Severe: Left L3-4; Right L4-5; Left L5-S1)  02/24/2017  . Personal history of tobacco use, presenting hazards to health 02/03/2017  . Advance care planning 10/08/2016  . MGUS (monoclonal gammopathy of unknown significance) 06/23/2016  . Schizophrenia ([place]) 06/07/2015  . Osteoporosis 06/07/2015  . History of smoking 06/07/2015  . Chronic low back pain (Location of Primary Source of Pain) (Bilateral) (R>L) 06/07/2015  . Idiopathic thrombocytopenic purpura ([place]) 06/07/2015  . Allergic rhinitis 06/07/2015  . Chronic leg pain 06/07/2015  . Hyperlipidemia 06/07/2015  . Hypertension 06/07/2015  . Lumbar IVDD (intervertebral disc displacement) 09/29/2014  . Neuritis or radiculitis due to rupture of lumbar intervertebral disc 09/29/2014  . Chronic Lumbar radiculitis 09/29/2014  . Lumbar HNP (herniated nucleus pulposus) 09/29/2014   Social History   Tobacco Use  . Smoking status: Former Smoker    Packs/day: 1.00    Years: 43.00    Pack years: 43.00    Types: Cigarettes    Quit date: 07/21/2017    Years since quitting: 3.0  . Smokeless tobacco: Former Systems developer    Types: Snuff  Vaping Use  . Vaping Use: Never used  Substance Use Topics  . Alcohol use: No  . Drug use: No   Allergies  Allergen Reactions  . No Known Allergies        Medications: Outpatient Medications Prior to Visit  Medication Sig  . Aspirin-Acetaminophen-Caffeine (GOODYS EXTRA STRENGTH) [phone] MG PACK Take by mouth.  Marland Kitchen atorvastatin (LIPITOR) 10 MG tablet TAKE 1 TABLET BY MOUTH DAILY AT 6 PM  . famotidine (PEPCID) 20 MG tablet Take 1 tablet (20 mg total) by mouth daily.  Marland Kitchen gabapentin (NEURONTIN) 300 MG capsule Take 1  capsule (300 mg total) by mouth 3 (three) times daily.  Marland Kitchen lisinopril (ZESTRIL) 10 MG tablet TAKE ONE-HALF TABLET BY MOUTH DAILY  . mirtazapine (REMERON) 7.5 MG tablet Take 1 tablet (7.5 mg total) by mouth at bedtime.  . raloxifene (EVISTA) 60 MG tablet TAKE 1 TABLET BY MOUTH EVERY DAY  . risperiDONE (RISPERDAL) 2 MG tablet TAKE 1/2 TO 1 TABLET  BY MOUTH AT BEDTIME  . tiZANidine (ZANAFLEX) 4 MG tablet TAKE 1 TABLET BY MOUTH EVERY 6 HOURS AS NEEDED FOR MUSCLE SPASMS  . Na Sulfate-K Sulfate-Mg Sulf 17.5-3.13-1.6 GM/177ML SOLN At 5 PM the day before procedure take 1 bottle and 5 hours before procedure take 1 bottle.   No facility-administered medications prior to visit.    Review of Systems  Constitutional: Positive for activity change and appetite change. Negative for chills, fatigue and fever.  Respiratory: Negative for cough, shortness of breath and wheezing.   Cardiovascular: Negative for chest pain, palpitations and leg swelling.  Gastrointestinal: Negative.  Negative for nausea and vomiting.  Genitourinary: Positive for frequency. Negative for decreased urine volume, difficulty urinating, dysuria, hematuria and urgency.  Musculoskeletal: Positive for back pain and myalgias. Negative for arthralgias, gait problem and joint swelling.  Skin: Negative.  Negative for rash.  Neurological: Negative.   Psychiatric/Behavioral: Negative.     Objective    BP 118/81 (BP Location: Left Arm, Patient Position: Sitting, Cuff Size: Normal)   Pulse 84   Temp 98.4 F (36.9 C) (Oral)   Resp 16   Ht 4' 11.5" (1.511 m)   Wt 115 lb 9.6 oz (52.4 kg)   LMP  (LMP Unknown)   SpO2 100%   BMI 22.96 kg/m  BP Readings from Last 3 Encounters:  08/06/20 118/81  07/06/20 117/78  05/04/20 128/78   Wt Readings from Last 3 Encounters:  08/06/20 115 lb 9.6 oz (52.4 kg)  07/06/20 118 lb (53.5 kg)  05/04/20 119 lb 6.4 oz (54.2 kg)      Physical Exam Vitals and nursing note reviewed.  Constitutional:      General: She is not in acute distress.    Appearance: Normal appearance. She is not toxic-appearing.  Eyes:     Extraocular Movements: Extraocular movements intact.  Cardiovascular:     Rate and Rhythm: Normal rate and regular rhythm.     Heart sounds: Normal heart sounds. No murmur heard.   Pulmonary:     Effort: Pulmonary effort is  normal. No respiratory distress.     Breath sounds: Normal breath sounds. No wheezing, rhonchi or rales.  Musculoskeletal:     Cervical back: Normal.     Thoracic back: Bony tenderness present. No swelling, edema or deformity. Decreased range of motion.     Lumbar back: Bony tenderness present. No swelling, deformity or tenderness. Decreased range of motion.     Right lower leg: No edema.     Left lower leg: No edema.  Skin:    General: Skin is warm and dry.     Coloration: Skin is not jaundiced or pale.  Neurological:     Mental Status: She is alert. Mental status is at baseline.     Motor: No weakness.  Psychiatric:        Behavior: Behavior normal.        Judgment: Judgment normal.     No results found for any visits on 08/06/20.  Assessment & Plan    1. Acute right-sided low back pain without sciatica Acute, ongoing.  Unclear  etiology although worrisome that pain started after fall.  Will obtain x-ray imaging and check UA to rule out kidney stone.  Also to start voltaren gel and/or lidocaine patches for back pain.  Follow up in 2 weeks.  - UA/M w/rflx Culture, Routine - DG Thoracic Spine W/Swimmers; Future - DG Lumbar Spine Complete; Future  2. Need for influenza vaccination - Flu Vaccine QUAD High Dose(Fluad)       Return in about 2 weeks (around 08/20/2020) for back pain f/u.      I have reviewed this encounter including the documentation in this note. I am certifying that I agree with the content of this note as primary care provider.   Eulogio Bear, NP  [facility] [phone] (phone) [phone] (fax)  [place]

## 2020-08-06 NOTE — Telephone Encounter (Signed)
Routing to provider  

## 2020-08-06 NOTE — Patient Instructions (Addendum)
Influenza (Flu) Vaccine (Inactivated or Recombinant): What You Need to Know 1. Why get vaccinated? Influenza vaccine can prevent influenza (flu). Flu is a contagious disease that spreads around the [place] every year, usually between October and May. Anyone can get the flu, but it is more dangerous for some people. Infants and young children, people 71 years of age and older, pregnant women, and people with certain health conditions or a weakened immune system are at greatest risk of flu complications. Pneumonia, bronchitis, sinus infections and ear infections are examples of flu-related complications. If you have a medical condition, such as heart disease, cancer or diabetes, flu can make it worse. Flu can cause fever and chills, sore throat, muscle aches, fatigue, cough, headache, and runny or stuffy nose. Some people may have vomiting and diarrhea, though this is more common in children than adults. Each year thousands of people in the [place] die from flu, and many more are hospitalized. Flu vaccine prevents millions of illnesses and flu-related visits to the doctor each year. 2. Influenza vaccine CDC recommends everyone 57 months of age and older get vaccinated every flu season. Children 6 months through 2 years of age may need 2 doses during a single flu season. Everyone else needs only 1 dose each flu season. It takes about 2 weeks for protection to develop after vaccination. There are many flu viruses, and they are always changing. Each year a new flu vaccine is made to protect against three or four viruses that are likely to cause disease in the upcoming flu season. Even when the vaccine doesn't exactly match these viruses, it may still provide some protection. Influenza vaccine does not cause flu. Influenza vaccine may be given at the same time as other vaccines. 3. Talk with your health care provider Tell your vaccine provider if the person getting the vaccine:  Has had an  allergic reaction after a previous dose of influenza vaccine, or has any severe, life-threatening allergies.  Has ever had Guillain-Barr Syndrome (also called GBS). In some cases, your health care provider may decide to postpone influenza vaccination to a future visit. People with minor illnesses, such as a cold, may be vaccinated. People who are moderately or severely ill should usually wait until they recover before getting influenza vaccine. Your health care provider can give you more information. 4. Risks of a vaccine reaction  Soreness, redness, and swelling where shot is given, fever, muscle aches, and headache can happen after influenza vaccine.  There may be a very small increased risk of Guillain-Barr Syndrome (GBS) after inactivated influenza vaccine (the flu shot). Young children who get the flu shot along with pneumococcal vaccine (PCV13), and/or DTaP vaccine at the same time might be slightly more likely to have a seizure caused by fever. Tell your health care provider if a child who is getting flu vaccine has ever had a seizure. People sometimes faint after medical procedures, including vaccination. Tell your provider if you feel dizzy or have vision changes or ringing in the ears. As with any medicine, there is a very remote chance of a vaccine causing a severe allergic reaction, other serious injury, or death. 5. What if there is a serious problem? An allergic reaction could occur after the vaccinated person leaves the clinic. If you see signs of a severe allergic reaction (hives, swelling of the face and throat, difficulty breathing, a fast heartbeat, dizziness, or weakness), call 9-1-1 and get the person to the nearest hospital. For other signs that  concern you, call your health care provider. Adverse reactions should be reported to the Vaccine Adverse Event Reporting System (VAERS). Your health care provider will usually file this report, or you can do it yourself. Visit the  VAERS website at www.vaers.SamedayNews.es or call 613-596-5090.VAERS is only for reporting reactions, and VAERS staff do not give medical advice. 6. The National Vaccine Injury Compensation Program The [employer] Vaccine Injury Compensation Program (VICP) is a federal program that was created to compensate people who may have been injured by certain vaccines. Visit the VICP website at GoldCloset.com.ee or call 301-829-8088 to learn about the program and about filing a claim. There is a time limit to file a claim for compensation. 7. How can I learn more?  Ask your healthcare provider.  Call your local or state health department.  Contact the Centers for Disease Control and Prevention (CDC): ? Call 647 453 6179 (1-800-CDC-INFO) or ? Visit CDC's https://gibson.com/ Vaccine Information Statement (Interim) Inactivated Influenza Vaccine (06/24/2018) This information is not intended to replace advice given to you by your health care provider. Make sure you discuss any questions you have with your health care provider. Document Revised: 02/15/2019 Document Reviewed: 06/28/2018 Elsevier Patient Education  [place].  Acute Back Pain, Adult Acute back pain is sudden and usually short-lived. It is often caused by an injury to the muscles and tissues in the back. The injury may result from:  A muscle or ligament getting overstretched or torn (strained). Ligaments are tissues that connect bones to each other. Lifting something improperly can cause a back strain.  Wear and tear (degeneration) of the spinal disks. Spinal disks are circular tissue that provides cushioning between the bones of the spine (vertebrae).  Twisting motions, such as while playing sports or doing yard work.  A hit to the back.  Arthritis. You may have a physical exam, lab tests, and imaging tests to find the cause of your pain. Acute back pain usually goes away with rest and home care. Follow these instructions  at home: Managing pain, stiffness, and swelling  Take over-the-counter and prescription medicines only as told by your health care provider.  Your health care provider may recommend applying ice during the first 24-48 hours after your pain starts. To do this: ? Put ice in a plastic bag. ? Place a towel between your skin and the bag. ? Leave the ice on for 20 minutes, 2-3 times a day.  If directed, apply heat to the affected area as often as told by your health care provider. Use the heat source that your health care provider recommends, such as a moist heat pack or a heating pad. ? Place a towel between your skin and the heat source. ? Leave the heat on for 20-30 minutes. ? Remove the heat if your skin turns bright red. This is especially important if you are unable to feel pain, heat, or cold. You have a greater risk of getting burned. Activity   Do not stay in bed. Staying in bed for more than 1-2 days can delay your recovery.  Sit up and stand up straight. Avoid leaning forward when you sit, or hunching over when you stand. ? If you work at a desk, sit close to it so you do not need to lean over. Keep your chin tucked in. Keep your neck drawn back, and keep your elbows bent at a right angle. Your arms should look like the letter "L." ? Sit high and close to the steering wheel when  you drive. Add lower back (lumbar) support to your car seat, if needed.  Take short walks on even surfaces as soon as you are able. Try to increase the length of time you walk each day.  Do not sit, drive, or stand in one place for more than 30 minutes at a time. Sitting or standing for long periods of time can put stress on your back.  Do not drive or use heavy machinery while taking prescription pain medicine.  Use proper lifting techniques. When you bend and lift, use positions that put less stress on your back: ? [place] your knees. ? Keep the load close to your body. ? Avoid twisting.  Exercise  regularly as told by your health care provider. Exercising helps your back heal faster and helps prevent back injuries by keeping muscles strong and flexible.  Work with a physical therapist to make a safe exercise program, as recommended by your health care provider. Do any exercises as told by your physical therapist. Lifestyle  Maintain a healthy weight. Extra weight puts stress on your back and makes it difficult to have good posture.  Avoid activities or situations that make you feel anxious or stressed. Stress and anxiety increase muscle tension and can make back pain worse. Learn ways to manage anxiety and stress, such as through exercise. General instructions  Sleep on a firm mattress in a comfortable position. Try lying on your side with your knees slightly bent. If you lie on your back, put a pillow under your knees.  Follow your treatment plan as told by your health care provider. This may include: ? Cognitive or behavioral therapy. ? Acupuncture or massage therapy. ? Meditation or yoga. Contact a health care provider if:  You have pain that is not relieved with rest or medicine.  You have increasing pain going down into your legs or buttocks.  Your pain does not improve after 2 weeks.  You have pain at night.  You lose weight without trying.  You have a fever or chills. Get help right away if:  You develop new bowel or bladder control problems.  You have unusual weakness or numbness in your arms or legs.  You develop nausea or vomiting.  You develop abdominal pain.  You feel faint. Summary  Acute back pain is sudden and usually short-lived.  Use proper lifting techniques. When you bend and lift, use positions that put less stress on your back.  Take over-the-counter and prescription medicines and apply heat or ice as directed by your health care provider. This information is not intended to replace advice given to you by your health care provider. Make sure  you discuss any questions you have with your health care provider. Document Revised: 02/15/2019 Document Reviewed: 06/10/2017 Elsevier Patient Education  [place].

## 2020-08-06 NOTE — Assessment & Plan Note (Signed)
Acute, ongoing.  Unclear etiology although worrisome that pain started after fall.  Will obtain x-ray imaging and check UA to rule out kidney stone.  Also to start voltaren gel and/or lidocaine patches for back pain.  Follow up in 2 weeks.

## 2020-08-06 NOTE — Telephone Encounter (Signed)
Requested medication (s) are due for refill today  Yes  Requested medication (s) are on the active medication Yes  Future visit scheduled Yes on 08/10/20.  Note to clinic-This medication is not delegated for the [facility] to refill. Routing to clinic   Requested Prescriptions  Pending Prescriptions Disp Refills   tiZANidine (ZANAFLEX) 4 MG tablet [Pharmacy Med Name: TIZANIDINE HCL 4 MG TAB] 30 tablet 0    Sig: TAKE 1 TABLET BY MOUTH EVERY 6 HOURS AS NEEDED FOR MUSCLE SPASMS      Not Delegated - Cardiovascular:  Alpha-2 [facility]on[facility] - tizanidine Failed - 08/06/2020  3:52 PM      Failed - This refill cannot be delegated      Passed - Valid encounter within last 6 months    Recent Outpatient Visits           Today Acute right-sided low back pain without sciatica   [place], NP   1 month ago Schizophrenia, unspecified type ([place])   [place], Jolene T, NP   3 months ago Abnormal weight loss   [place], [place] T, NP   3 months ago Weight loss   [employer], [place] T, NP   4 months ago Schizophrenia, unspecified type ([place])   [place], Barbaraann Faster, NP       Future Appointments             In 1 week Cannady, Barbaraann Faster, NP [facility] [facility], [facility]   In 3 months  [facility], [facility]

## 2020-08-07 ENCOUNTER — Ambulatory Visit
Admission: RE | Admit: 2020-08-07 | Discharge: 2020-08-07 | Disposition: A | Discharge status: Home / Self Care | Payer: Medicare Other | Source: Ambulatory Visit | Attending: Nurse Practitioner | Admitting: Nurse Practitioner

## 2020-08-07 ENCOUNTER — Ambulatory Visit
Admission: RE | Admit: 2020-08-07 | Discharge: 2020-08-07 | Disposition: A | Discharge status: Home / Self Care | Payer: Medicare Other | Attending: Nurse Practitioner | Admitting: Nurse Practitioner

## 2020-08-07 DIAGNOSIS — M545 Low back pain, unspecified: Secondary | ICD-10-CM

## 2020-08-07 DIAGNOSIS — M5134 Other intervertebral disc degeneration, thoracic region: Secondary | ICD-10-CM | POA: Diagnosis not present

## 2020-08-07 DIAGNOSIS — M4184 Other forms of scoliosis, thoracic region: Secondary | ICD-10-CM | POA: Diagnosis not present

## 2020-08-07 DIAGNOSIS — S22080A Wedge compression fracture of T11-T12 vertebra, initial encounter for closed fracture: Secondary | ICD-10-CM | POA: Diagnosis not present

## 2020-08-07 DIAGNOSIS — I700 Atherosclerosis of aorta: Secondary | ICD-10-CM | POA: Diagnosis not present

## 2020-08-07 DIAGNOSIS — S3992XA Unspecified injury of lower back, initial encounter: Secondary | ICD-10-CM | POA: Diagnosis not present

## 2020-08-08 ENCOUNTER — Telehealth: Payer: Self-pay

## 2020-08-08 NOTE — Telephone Encounter (Signed)
PA for Lidocaine patches initiated and submitted via Cover My Meds. Key: [id number]

## 2020-08-08 NOTE — Telephone Encounter (Signed)
PA approved.

## 2020-08-09 ENCOUNTER — Telehealth: Payer: Self-pay | Admitting: Nurse Practitioner

## 2020-08-09 NOTE — Progress Notes (Signed)
Please let patient know that there was a small compression fracture in her thoracic spine.  There were no fractures in her lumbar spine.  We should continue to manage this conservatively with the medications previously prescribed keep close follow up in a couple of weeks, especially if her back pain is not improving.

## 2020-08-09 NOTE — Telephone Encounter (Signed)
Annnette McDow calling from [place] is calling to check on the status of Home health Orders  That where sent by fax on 08/03/20. Please advise CB- [phone]

## 2020-08-10 ENCOUNTER — Ambulatory Visit (INDEPENDENT_AMBULATORY_CARE_PROVIDER_SITE_OTHER): Payer: Medicare Other | Admitting: Licensed Clinical Social Worker

## 2020-08-10 DIAGNOSIS — F209 Schizophrenia, unspecified: Secondary | ICD-10-CM

## 2020-08-10 DIAGNOSIS — E7800 Pure hypercholesterolemia, unspecified: Secondary | ICD-10-CM | POA: Diagnosis not present

## 2020-08-10 DIAGNOSIS — I10 Essential (primary) hypertension: Secondary | ICD-10-CM

## 2020-08-10 LAB — UA/M W/RFLX CULTURE, ROUTINE
Bilirubin, UA: NEGATIVE
Glucose, UA: NEGATIVE
Ketones, UA: NEGATIVE
Nitrite, UA: POSITIVE — AB
Protein,UA: NEGATIVE
Specific Gravity, UA: <1.005 — ABNORMAL LOW (ref 1.005–1.030)
Urobilinogen, Ur: 0.2 mg/dL (ref 0.2–1.0)
pH, UA: 5.5 (ref 5.0–7.5)

## 2020-08-10 LAB — MICROSCOPIC EXAMINATION

## 2020-08-10 LAB — URINE CULTURE, REFLEX
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
## 2020-08-10 NOTE — Telephone Encounter (Signed)
Form has been received and is in providers folder to sign when she returns. Called and notified Anne Ng of this.

## 2020-08-17 ENCOUNTER — Ambulatory Visit (INDEPENDENT_AMBULATORY_CARE_PROVIDER_SITE_OTHER): Payer: Medicare Other | Admitting: Nurse Practitioner

## 2020-08-17 ENCOUNTER — Other Ambulatory Visit: Payer: Self-pay

## 2020-08-17 ENCOUNTER — Encounter: Payer: Self-pay | Admitting: Nurse Practitioner

## 2020-08-17 VITALS — BP 123/84 | HR 85 | Temp 98.0°F | Ht 59.0 in | Wt 117.8 lb

## 2020-08-17 DIAGNOSIS — R634 Abnormal weight loss: Secondary | ICD-10-CM

## 2020-08-17 DIAGNOSIS — D472 Monoclonal gammopathy: Secondary | ICD-10-CM | POA: Diagnosis not present

## 2020-08-17 DIAGNOSIS — Z87891 Personal history of nicotine dependence: Secondary | ICD-10-CM | POA: Diagnosis not present

## 2020-08-17 DIAGNOSIS — D693 Immune thrombocytopenic purpura: Secondary | ICD-10-CM | POA: Diagnosis not present

## 2020-08-17 MED ORDER — TIZANIDINE HCL 4 MG PO TABS
ORAL_TABLET | ORAL | 2 refills | Status: DC
Start: 2020-08-17 — End: 2020-10-22

## 2020-08-17 NOTE — Patient Instructions (Signed)
Healthy Eating Following a healthy eating pattern may help you to achieve and maintain a healthy body weight, reduce the risk of chronic disease, and live a long and productive life. It is important to follow a healthy eating pattern at an appropriate calorie level for your body. Your nutritional needs should be met primarily through food by choosing a variety of nutrient-rich foods. What are tips for following this plan? Reading food labels  Read labels and choose the following: ? Reduced or low sodium. ? Juices with 100% fruit juice. ? Foods with low saturated fats and high polyunsaturated and monounsaturated fats. ? Foods with whole grains, such as whole wheat, cracked wheat, brown rice, and wild rice. ? Whole grains that are fortified with folic acid. This is recommended for women who are pregnant or who want to become pregnant.  Read labels and avoid the following: ? Foods with a lot of added sugars. These include foods that contain brown sugar, corn sweetener, corn syrup, dextrose, fructose, glucose, high-fructose corn syrup, honey, invert sugar, lactose, malt syrup, maltose, molasses, raw sugar, sucrose, trehalose, or turbinado sugar.  Do not eat more than the following amounts of added sugar per day:  6 teaspoons (25 g) for women.  9 teaspoons (38 g) for men. ? Foods that contain processed or refined starches and grains. ? Refined grain products, such as white flour, degermed cornmeal, white bread, and white rice. Shopping  Choose nutrient-rich snacks, such as vegetables, whole fruits, and nuts. Avoid high-calorie and high-sugar snacks, such as potato chips, fruit snacks, and candy.  Use oil-based dressings and spreads on foods instead of solid fats such as butter, stick margarine, or cream cheese.  Limit pre-made sauces, mixes, and "instant" products such as flavored rice, instant noodles, and ready-made pasta.  Try more plant-protein sources, such as tofu, tempeh, black beans,  edamame, lentils, nuts, and seeds.  Explore eating plans such as the Mediterranean diet or vegetarian diet. Cooking  Use oil to saut or stir-fry foods instead of solid fats such as butter, stick margarine, or lard.  Try baking, boiling, grilling, or broiling instead of frying.  Remove the fatty part of meats before cooking.  Steam vegetables in water or broth. Meal planning   At meals, imagine dividing your plate into fourths: ? One-half of your plate is fruits and vegetables. ? One-fourth of your plate is whole grains. ? One-fourth of your plate is protein, especially lean meats, poultry, eggs, tofu, beans, or nuts.  Include low-fat dairy as part of your daily diet. Lifestyle  Choose healthy options in all settings, including home, work, school, restaurants, or stores.  Prepare your food safely: ? Wash your hands after handling raw meats. ? Keep food preparation surfaces clean by regularly washing with hot, soapy water. ? Keep raw meats separate from ready-to-eat foods, such as fruits and vegetables. ? Cook seafood, meat, poultry, and eggs to the recommended internal temperature. ? Store foods at safe temperatures. In general:  Keep cold foods at 59F (4.4C) or below.  Keep hot foods at 159F (60C) or above.  Keep your freezer at [facility] (-17.8C) or below.  Foods are no longer safe to eat when they have been between the temperatures of 40-159F (4.4-60C) for more than 2 hours. What foods should I eat? Fruits Aim to eat 2 cup-equivalents of fresh, canned (in natural juice), or frozen fruits each day. Examples of 1 cup-equivalent of fruit include 1 small apple, 8 large strawberries, 1 cup canned fruit,  cup  dried fruit, or 1 cup 100% juice. Vegetables Aim to eat 2-3 cup-equivalents of fresh and frozen vegetables each day, including different varieties and colors. Examples of 1 cup-equivalent of vegetables include 2 medium carrots, 2 cups raw, leafy greens, 1 cup chopped  vegetable (raw or cooked), or 1 medium baked potato. Grains Aim to eat 6 ounce-equivalents of whole grains each day. Examples of 1 ounce-equivalent of grains include 1 slice of bread, 1 cup ready-to-eat cereal, 3 cups popcorn, or  cup cooked rice, pasta, or cereal. Meats and other proteins Aim to eat 5-6 ounce-equivalents of protein each day. Examples of 1 ounce-equivalent of protein include 1 egg, 1/2 cup nuts or seeds, or 1 tablespoon (16 g) peanut butter. A cut of meat or fish that is the size of a deck of cards is about 3-4 ounce-equivalents.  Of the protein you eat each week, try to have at least 8 ounces come from seafood. This includes salmon, trout, herring, and anchovies. Dairy Aim to eat 3 cup-equivalents of fat-free or low-fat dairy each day. Examples of 1 cup-equivalent of dairy include 1 cup (240 mL) milk, 8 ounces (250 g) yogurt, 1 ounces (44 g) natural cheese, or 1 cup (240 mL) fortified soy milk. Fats and oils  Aim for about 5 teaspoons (21 g) per day. Choose monounsaturated fats, such as canola and olive oils, avocados, peanut butter, and most nuts, or polyunsaturated fats, such as sunflower, corn, and soybean oils, walnuts, pine nuts, sesame seeds, sunflower seeds, and flaxseed. Beverages  Aim for six 8-oz glasses of water per day. Limit coffee to three to five 8-oz cups per day.  Limit caffeinated beverages that have added calories, such as soda and energy drinks.  Limit alcohol intake to no more than 1 drink a day for nonpregnant women and 2 drinks a day for men. One drink equals 12 oz of beer (355 mL), 5 oz of wine (148 mL), or 1 oz of hard liquor (44 mL). Seasoning and other foods  Avoid adding excess amounts of salt to your foods. Try flavoring foods with herbs and spices instead of salt.  Avoid adding sugar to foods.  Try using oil-based dressings, sauces, and spreads instead of solid fats. This information is based on general [place] nutrition guidelines. For more  information, visit [employer]. Exact amounts may vary based on your nutrition needs. Summary  A healthy eating plan may help you to maintain a healthy weight, reduce the risk of chronic diseases, and stay active throughout your life.  Plan your meals. Make sure you eat the right portions of a variety of nutrient-rich foods.  Try baking, boiling, grilling, or broiling instead of frying.  Choose healthy options in all settings, including home, work, school, restaurants, or stores. This information is not intended to replace advice given to you by your health care provider. Make sure you discuss any questions you have with your health care provider. Document Revised: 02/08/2018 Document Reviewed: 02/08/2018 Elsevier Patient Education  [place].

## 2020-08-17 NOTE — Assessment & Plan Note (Signed)
Lung CT order placed due to current weight loss and appetite changes, discussed at length with patient need to obtain this, even though improving.  Recommend continued cessation of smoking.

## 2020-08-17 NOTE — Assessment & Plan Note (Signed)
Continue to collaborate with hematology, recommend she schedule with them.  She denies bleeding or increased bruising.

## 2020-08-17 NOTE — Assessment & Plan Note (Signed)
Discussed at length with patient need for consistent follow-up with hematology, especially in presence of recent weight loss.  Will see if CCM team can assist in getting her on schedule there.  Continue to collaborate with CCM team on transportation + connected care.  CBC up to date with nephrology recently.  Return in 2 months.
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
## 2020-08-17 NOTE — Assessment & Plan Note (Signed)
Weight gain present today and appetite reported to be improving -- ?related to her mood and schizophrenia.  She denies any symptoms at this time, no B symptoms.  Has diagnosis of underlying MGUS and ITP, have recommended she follow-up with oncology ASAP and will see if CCM team can assist with this.  Continue collaboration with GI due to weight loss and history of smoking no previous colonoscopy -- had negative iFOBT, but may benefit from colonoscopy. Appreciate their input.  Highly recommend she still obtain lung and abdominal CT, even though some improvement present.  Will reach out to CCM team to assist with transportation for this patient. Return in 2 months

## 2020-08-17 NOTE — Telephone Encounter (Signed)
Caller name: Abran Duke Call back number [phone]   Reason for call:  Stating forms were incomplete and would like to follow up call, please advise today

## 2020-08-20 ENCOUNTER — Telehealth: Payer: Self-pay

## 2020-08-20 ENCOUNTER — Other Ambulatory Visit: Payer: Self-pay | Admitting: Nurse Practitioner

## 2020-08-20 ENCOUNTER — Telehealth: Payer: Self-pay | Admitting: General Practice

## 2020-08-20 MED ORDER — LISINOPRIL 5 MG PO TABS: 5.0000 mg | ORAL_TABLET | Freq: Every day | ORAL | 3 refills | Status: DC

## 2020-08-20 NOTE — Telephone Encounter (Signed)
Routing to provider to advise.  

## 2020-08-20 NOTE — Telephone Encounter (Signed)
  Chronic Care Management   Note  08/20/2020 Name: Denise Everett MRN: [id number] DOB: 08-07-1951  Call made to Dr. Candie Chroman office and spoke with the scheduler. The patient has not been to see the provider in several months. Information provided and the scheduler will consult with Dr. Grayland Ormond and get back to the [facility] for scheduling.   Follow up plan: Will follow up with the patient when information is obtained from the provider office.    Noreene Larsson RN, MSN, [facility] Mobile: [phone]

## 2020-08-20 NOTE — Telephone Encounter (Signed)
Will correct and complete form ASAP with Jolene. Will fax back ASAP.

## 2020-08-20 NOTE — Telephone Encounter (Signed)
Copied from [place] [id number]. Topic: Quick Communication - Rx Refill/Question >> Aug 20, 2020 11:05 AM Hinda Lenis D wrote: Denise Everett [phone] / lisinopril (ZESTRIL) 10 MG tablet [[id number]] requesting a new prescription for 46ml so the PT don't have to cut the tablet in have / please advise

## 2020-08-20 NOTE — Telephone Encounter (Signed)
Completed.

## 2020-08-21 ENCOUNTER — Telehealth: Payer: Self-pay

## 2020-08-21 NOTE — Telephone Encounter (Signed)
Form corrected/completed and re-faxed to [facility].

## 2020-08-22 ENCOUNTER — Telehealth: Payer: Self-pay | Admitting: General Practice

## 2020-08-22 ENCOUNTER — Ambulatory Visit: Payer: Self-pay | Admitting: General Practice

## 2020-08-22 DIAGNOSIS — N1832 Chronic kidney disease, stage 3b: Secondary | ICD-10-CM

## 2020-08-22 DIAGNOSIS — F209 Schizophrenia, unspecified: Secondary | ICD-10-CM

## 2020-08-22 DIAGNOSIS — E7800 Pure hypercholesterolemia, unspecified: Secondary | ICD-10-CM

## 2020-08-22 DIAGNOSIS — I10 Essential (primary) hypertension: Secondary | ICD-10-CM | POA: Diagnosis not present

## 2020-08-22 DIAGNOSIS — R634 Abnormal weight loss: Secondary | ICD-10-CM

## 2020-08-22 DIAGNOSIS — D693 Immune thrombocytopenic purpura: Secondary | ICD-10-CM

## 2020-08-22 DIAGNOSIS — D472 Monoclonal gammopathy: Secondary | ICD-10-CM

## 2020-08-22 NOTE — Chronic Care Management (AMB) (Signed)
Chronic Care Management   Follow Up Note   08/22/2020 Name: Denise Everett MRN: [id number] DOB: [birth year]  Referred by: Venita Lick, NP Reason for referral : Chronic Care Management (RNCM Follow up call for Chronic Disease management and Care Coordination needs/help with specialist appointment)   Denise Everett is a 69 y.o. year old female who is a primary care patient of Cannady, Barbaraann Faster, NP. The CCM team was consulted for assistance with chronic disease management and care coordination needs.    Review of patient status, including review of consultants reports, relevant laboratory and other test results, and collaboration with appropriate care team members and the patient's provider was performed as part of comprehensive patient evaluation and provision of chronic care management services.    SDOH (Social Determinants of Health) assessments performed: Yes See Care Plan activities for detailed interventions related to [facility])     Outpatient Encounter Medications as of 08/22/2020  Medication Sig  . Aspirin-Acetaminophen-Caffeine (GOODYS EXTRA STRENGTH) [phone] MG PACK Take by mouth.  Marland Kitchen atorvastatin (LIPITOR) 10 MG tablet TAKE 1 TABLET BY MOUTH DAILY AT 6 PM  . diclofenac Sodium (VOLTAREN) 1 % GEL Apply 4 g topically 4 (four) times daily.  . famotidine (PEPCID) 20 MG tablet Take 1 tablet (20 mg total) by mouth daily.  Marland Kitchen gabapentin (NEURONTIN) 300 MG capsule Take 1 capsule (300 mg total) by mouth 3 (three) times daily.  Marland Kitchen lidocaine (LIDODERM) 5 % Place 1 patch onto the skin daily. Remove & Discard patch within 12 hours or as directed by MD (Patient not taking: Reported on 08/17/2020)  . lisinopril (ZESTRIL) 5 MG tablet Take 1 tablet (5 mg total) by mouth daily.  . mirtazapine (REMERON) 7.5 MG tablet Take 1 tablet (7.5 mg total) by mouth at bedtime.  . Na Sulfate-K Sulfate-Mg Sulf 17.5-3.13-1.6 GM/177ML SOLN At 5 PM the day before procedure take 1 bottle and 5 hours before  procedure take 1 bottle.  . raloxifene (EVISTA) 60 MG tablet TAKE 1 TABLET BY MOUTH EVERY DAY  . risperiDONE (RISPERDAL) 2 MG tablet TAKE 1/2 TO 1 TABLET BY MOUTH AT BEDTIME  . sulfamethoxazole-trimethoprim (BACTRIM DS) 800-160 MG tablet Take 1 tablet by mouth 2 (two) times daily.  Marland Kitchen tiZANidine (ZANAFLEX) 4 MG tablet TAKE 1 TABLET BY MOUTH EVERY 6 HOURS AS NEEDED FOR MUSCLE SPASMS.   No facility-administered encounter medications on file as of 08/22/2020.     Objective:   BP Readings from Last 3 Encounters:  08/17/20 123/84  08/06/20 118/81  07/06/20 117/78   Goals Addressed              This Visit's Progress   .  RNCM: Pt- "I don't know how to take my blood pressure" (pt-stated)        CARE PLAN ENTRY (see longtitudinal plan of care for additional care plan information)  Current Barriers:  . Chronic Disease Management support, education, and care coordination needs related to HTN, HLD, CKD Stage 3, and Schizophrenia  Clinical Goal(s) related to HTN, HLD, CKD Stage 3, and Schizophrenia :  Over the next 120 days, patient will:  . Work with the care management team to address educational, disease management, and care coordination needs  . Begin or continue self health monitoring activities as directed today  work with the patient on self monitoring of blood pressure and adherence to a heart healthy diet . Call provider office for new or worsened signs and symptoms Blood pressure findings outside established parameters, Shortness  of breath, and New or worsened symptom related to HLD/HTN/Kidney function/Schizophrenia and other chronic conditions . Call care management team with questions or concerns . Verbalize basic understanding of patient centered plan of care established today  Interventions related to HTN, HLD, CKD Stage 3, and Schizophrenia :  . Evaluation of current treatment plans and patient's adherence to plan as established by provider.  The patient verbalized compliance  with her medications regimen and recommendations by her pcp.  08-22-2020: Call made to Dr. Candie Chroman office and made and appointment for the patient to follow up with the  provider for evaluation of her MGUS and ITP.  The patient has an appointment for 08-31-2020 at 10:15 am. The patient has been called and she is aware of the appointment and will call the "wagon" to get transportation arranged.  The RNCM will follow up with the patient on Monday to make sure the patient has adequate transportation. A care guide referral has been made to assist with transportation also. Explained the importance of keeping her appointment with Dr. Grayland Ormond.  . Assessed patient understanding of disease states.  The patient said recently she did not know why she had a decreased appetite but states she is eating better now and wanted the [employer] to let the pcp know. States she only eats one meal a day and sometimes has a snack.  Eats out some and cooks some. 08-22-2020: The patient states that she gained 2 pounds when she went for her MD appointment last week. She says she is doing good and feels fine. She feels she is doing better than she has in a long time.  . Assessed ability to get to procedures. The patient states she was told she needed someone with her but then they tell her she doesn't. Ask her about her brother or sister in law. She says she can ask her sister in law she would do it because the other lady had a car wreck.  Education and support given. Encouraged the patient to reach out to the [facility] for future needs related to transportation and help. 08-22-2020: Explained to the patient the importance of going to her upcoming appointment with the specialist. She says she will call tomorrow and set up transportation for next week.  She says the "wagon" will take her to her appointment. The patient states that she will make sure she goes but is willing for the [facility] to call and remind her. RNCM will attempt to call a  couple of times next week to remind her of her appointment and make sure she has adequate transportation.  . Assessed patient's education and care coordination needs.  The patient denies any needs at this time but knows resources are available. 08-22-2020: Ongoing support and education concerning the patients need to be followed by the specialist and follow the recommendations of the pcp.  . Evaluation of heart healthy diet. The patient reviewed some of the things she eats. Will work with the patient on Heart Healthy options. Does not use sodium. Likes to season with pepper. 05-29-2020: The patient states some days she eats good and some days she doesn't. Had gained a pound when she saw the doctor recently. The patient states she has always been like that. Education and support given. 08-22-2020: On today's call the patient states she is eating better and actually gained weight. She feels she is doing well.  . Provided disease specific education to patient.  The patient denies having headaches. The patient verbalized she  did not know how to take her blood pressure. Will see if can work with the patient at her next pcp visit on May 19th to see if this is something she can be taught. The patient has a stable blood pressure at this time. Nash Dimmer with appropriate clinical care team members regarding patient needs.  LCSW referral in place. The patient can benefit from coping skills when she feels down or has a decreased appetite. The patient is receptive to talking to the CCM team.   Patient Self Care Activities related to HTN, HLD, CKD Stage 3, and Schizophrenia :  . Patient is unable to independently self-manage chronic health conditions  Please see past updates related to this goal by clicking on the "Past Updates" button in the selected goal      .  COMPLETED: RNCM: Pt-"I had a fall today" (pt-stated)        CARE PLAN ENTRY (see longitudinal plan of care for additional care plan  information)  Current Barriers: Closing goal.  The patient has had no new falls >5 months. Will reevaluate for new falls.  . Knowledge Deficits related to fall precautions . Decreased adherence to prescribed treatment for fall prevention . Literacy barriers  Clinical Goal(s):  Marland Kitchen Over the next 120 days, patient will demonstrate improved adherence to prescribed treatment plan for decreasing falls as evidenced by patient reporting and review of EMR . Over the next 120 days, patient will verbalize using fall risk reduction strategies discussed . Over the next 120 days, patient will not experience additional falls . Over the next 120 days, patient will work with [facility] and pcp to address needs related to fall risk . Over the next 120 days, patient will attend all scheduled medical appointments: appointment with pcp 03-28-2020.  Interventions:  . Provided written and verbal education re: Potential causes of falls and Fall prevention strategies . Reviewed medications and discussed potential side effects of medications such as dizziness and frequent urination . Assessed for s/s of orthostatic hypotension . Assessed for falls since last encounter. 05-29-2020: The patient denies any new falls. States she is doing well just "getting old" . Assessed patients knowledge of fall risk prevention secondary to previously provided education. . Assessed working status of life alert bracelet and patient adherence . Provided patient information for fall alert systems . Evaluation of current treatment plan related to falls and patient's adherence to plan as established by provider. . Advised patient to call the pcp for new falls . Provided education to patient re: safety in the home to prevent falls  Patient Self Care Activities:  . Utilize safety when ambulating.  The patient does not use DME(assistive device) appropriately with all ambulation . De-clutter walkways . Change positions slowly . Wear secure fitting  shoes at all times with ambulation . Utilize home lighting for dim lit areas . Have self and pet awareness at all times  Plan: . CCM RN CM will follow up in 60   Please see past updates related to this goal by clicking on the "Past Updates" button in the selected goal          Plan:   Telephone follow up appointment with care management team member scheduled for: 10-12-2020 at 230 pm   [place], MSN, [facility] Mobile: [phone]

## 2020-08-22 NOTE — Patient Instructions (Signed)
Visit Information  Goals Addressed              This Visit's Progress     RNCM: Pt- "I don't know how to take my blood pressure" (pt-stated)        CARE PLAN ENTRY (see longtitudinal plan of care for additional care plan information)  Current Barriers:   Chronic Disease Management support, education, and care coordination needs related to HTN, HLD, CKD Stage 3, and Schizophrenia  Clinical Goal(s) related to HTN, HLD, CKD Stage 3, and Schizophrenia :  Over the next 120 days, patient will:   Work with the care management team to address educational, disease management, and care coordination needs   Begin or continue self health monitoring activities as directed today  work with the patient on self monitoring of blood pressure and adherence to a heart healthy diet  Call provider office for new or worsened signs and symptoms Blood pressure findings outside established parameters, Shortness of breath, and New or worsened symptom related to HLD/HTN/Kidney function/Schizophrenia and other chronic conditions  Call care management team with questions or concerns  Verbalize basic understanding of patient centered plan of care established today  Interventions related to HTN, HLD, CKD Stage 3, and Schizophrenia :   Evaluation of current treatment plans and patient's adherence to plan as established by provider.  The patient verbalized compliance with her medications regimen and recommendations by her pcp.  08-22-2020: Call made to Dr. Candie Chroman office and made and appointment for the patient to follow up with the  provider for evaluation of her MGUS and ITP.  The patient has an appointment for 08-31-2020 at 10:15 am. The patient has been called and she is aware of the appointment and will call the "wagon" to get transportation arranged.  The RNCM will follow up with the patient on Monday to make sure the patient has adequate transportation. A care guide referral has been made to assist  with transportation also. Explained the importance of keeping her appointment with Dr. Grayland Ormond.   Assessed patient understanding of disease states.  The patient said recently she did not know why she had a decreased appetite but states she is eating better now and wanted the [facility] to let the pcp know. States she only eats one meal a day and sometimes has a snack.  Eats out some and cooks some. 08-22-2020: The patient states that she gained 2 pounds when she went for her MD appointment last week. She says she is doing good and feels fine. She feels she is doing better than she has in a long time.   Assessed ability to get to procedures. The patient states she was told she needed someone with her but then they tell her she doesn't. Ask her about her brother or sister in law. She says she can ask her sister in law she would do it because the other lady had a car wreck.  Education and support given. Encouraged the patient to reach out to the [facility] for future needs related to transportation and help. 08-22-2020: Explained to the patient the importance of going to her upcoming appointment with the specialist. She says she will call tomorrow and set up transportation for next week.  She says the "wagon" will take her to her appointment. The patient states that she will make sure she goes but is willing for the [facility] to call and remind her. RNCM will attempt to call a couple of times next week to remind her  of her appointment and make sure she has adequate transportation.   Assessed patient's education and care coordination needs.  The patient denies any needs at this time but knows resources are available. 08-22-2020: Ongoing support and education concerning the patients need to be followed by the specialist and follow the recommendations of the pcp.   Evaluation of heart healthy diet. The patient reviewed some of the things she eats. Will work with the patient on Heart Healthy options. Does not use sodium. Likes to  season with pepper. 05-29-2020: The patient states some days she eats good and some days she doesn't. Had gained a pound when she saw the doctor recently. The patient states she has always been like that. Education and support given. 08-22-2020: On today's call the patient states she is eating better and actually gained weight. She feels she is doing well.   Provided disease specific education to patient.  The patient denies having headaches. The patient verbalized she did not know how to take her blood pressure. Will see if can work with the patient at her next pcp visit on May 19th to see if this is something she can be taught. The patient has a stable blood pressure at this time.  Collaborated with appropriate clinical care team members regarding patient needs.  LCSW referral in place. The patient can benefit from coping skills when she feels down or has a decreased appetite. The patient is receptive to talking to the CCM team.   Patient Self Care Activities related to HTN, HLD, CKD Stage 3, and Schizophrenia :   Patient is unable to independently self-manage chronic health conditions  Please see past updates related to this goal by clicking on the "Past Updates" button in the selected goal        COMPLETED: RNCM: Pt-"I had a fall today" (pt-stated)        CARE PLAN ENTRY (see longitudinal plan of care for additional care plan information)  Current Barriers: Closing goal.  The patient has had no new falls >5 months. Will reevaluate for new falls.   Knowledge Deficits related to fall precautions  Decreased adherence to prescribed treatment for fall prevention  Literacy barriers  Clinical Goal(s):   Over the next 120 days, patient will demonstrate improved adherence to prescribed treatment plan for decreasing falls as evidenced by patient reporting and review of EMR  Over the next 120 days, patient will verbalize using fall risk reduction strategies discussed  Over the next 120 days,  patient will not experience additional falls  Over the next 120 days, patient will work with [facility] and pcp to address needs related to fall risk  Over the next 120 days, patient will attend all scheduled medical appointments: appointment with pcp 03-28-2020.  Interventions:   Provided written and verbal education re: Potential causes of falls and Fall prevention strategies  Reviewed medications and discussed potential side effects of medications such as dizziness and frequent urination  Assessed for s/s of orthostatic hypotension  Assessed for falls since last encounter. 05-29-2020: The patient denies any new falls. States she is doing well just "getting old"  Assessed patients knowledge of fall risk prevention secondary to previously provided education.  Assessed working status of life alert bracelet and patient adherence  Provided patient information for fall alert systems  Evaluation of current treatment plan related to falls and patient's adherence to plan as established by provider.  Advised patient to call the pcp for new falls  Provided education to patient re:  safety in the home to prevent falls  Patient Self Care Activities:   Utilize safety when ambulating.  The patient does not use DME(assistive device) appropriately with all ambulation  De-clutter walkways  Change positions slowly  Wear secure fitting shoes at all times with ambulation  Utilize home lighting for dim lit areas  Have self and pet awareness at all times  Plan:  CCM RN CM will follow up in 60   Please see past updates related to this goal by clicking on the "Past Updates" button in the selected goal         Patient verbalizes understanding of instructions provided today.   Telephone follow up appointment with care management team member scheduled for: 10-12-2020 at 230 pm  Noreene Larsson RN, MSN, [facility] Mobile: [phone]

## 2020-08-23 ENCOUNTER — Telehealth: Payer: Self-pay | Admitting: Nurse Practitioner

## 2020-08-23 NOTE — Telephone Encounter (Signed)
Denise Everett 08/23/2020 Called pt regarding community resource referral received. Left message for pt to call me back, my info is [phone] please see ref notes for more details.  [place], Care Management

## 2020-08-24 NOTE — Progress Notes (Signed)
[place]  [phone]) [phone] [phone]) [phone]  ID: Denise Everett OB: 05-03-1951  MR#: [id number]  JOA#:[id number]  Patient Care Team: Venita Lick, NP as PCP - General (Nurse Practitioner) Lloyd Huger, MD as Consulting Physician (Oncology) Vanita Ingles, RN as Case Manager (General Practice) Greg Cutter, LCSW as Social Worker (Licensed Clinical Social Worker)  CHIEF COMPLAINT: MGUS and ITP  INTERVAL HISTORY: Patient last evaluated in clinic in January 2019.  She is referred back for further evaluation of her platelet count and MGUS.  She continues to have weight loss of unknown etiology, but otherwise feels well.  She has no neurologic complaints.  She denies any recent fevers or illnesses.  She denies any easy bleeding or bruising.  She denies any chest pain, shortness of breath, cough, or hemoptysis.  She denies any nausea, vomiting, constipation, or diarrhea.  She has no melena or hematochezia.  She has no urinary complaints.  Patient offers no further specific complaints today.  REVIEW OF SYSTEMS:   Review of Systems  Constitutional: Positive for weight loss. Negative for fever and malaise/fatigue.  Respiratory: Negative.  Negative for cough and shortness of breath.   Cardiovascular: Negative.  Negative for chest pain and leg swelling.  Gastrointestinal: Negative.  Negative for abdominal pain, blood in stool and melena.  Genitourinary: Negative.  Negative for dysuria.  Musculoskeletal: Negative.  Negative for back pain.  Skin: Negative.  Negative for rash.  Neurological: Negative.  Negative for dizziness, focal weakness, weakness and headaches.  Endo/Heme/Allergies: Does not bruise/bleed easily.  Psychiatric/Behavioral: Negative.  The patient is not nervous/anxious.     As per HPI. Otherwise, a complete review of systems is negative.  PAST MEDICAL HISTORY: Past Medical History:  Diagnosis Date  . Abnormal weight loss 06/07/2015  .  Allergy   . Cataract   . Chronic kidney disease   . Chronic pain syndrome   . Hyperlipidemia   . Hypertension   . Leg pain   . Osteoporosis    hips  . Renal insufficiency   . Schizophrenia ([place])   . Thrombocytopenia ([place])   . Tobacco use disorder     PAST SURGICAL HISTORY: Past Surgical History:  Procedure Laterality Date  . ankle surgery Right    x's 2  . CATARACT EXTRACTION      FAMILY HISTORY Family History  Problem Relation Age of Onset  . Hyperlipidemia Mother   . Hypertension Mother        ADVANCED DIRECTIVES:    HEALTH MAINTENANCE: Social History   Tobacco Use  . Smoking status: Former Smoker    Packs/day: 1.00    Years: 43.00    Pack years: 43.00    Types: Cigarettes    Quit date: 07/21/2017    Years since quitting: 3.1  . Smokeless tobacco: Former Systems developer    Types: Snuff  Vaping Use  . Vaping Use: Never used  Substance Use Topics  . Alcohol use: No  . Drug use: No     Colonoscopy:  PAP:  Bone density:  Lipid panel:  Allergies  Allergen Reactions  . No Known Allergies     Current Outpatient Medications  Medication Sig Dispense Refill  . Aspirin-Acetaminophen-Caffeine (GOODYS EXTRA STRENGTH) [id number] MG PACK Take by mouth.    Marland Kitchen atorvastatin (LIPITOR) 10 MG tablet TAKE 1 TABLET BY MOUTH DAILY AT 6 PM 90 tablet 1  . diclofenac Sodium (VOLTAREN) 1 % GEL Apply 4 g topically 4 (four) times daily. [place]  g 1  . famotidine (PEPCID) 20 MG tablet Take 1 tablet (20 mg total) by mouth daily. 90 tablet 3  . gabapentin (NEURONTIN) 300 MG capsule Take 1 capsule (300 mg total) by mouth 3 (three) times daily. 180 capsule 3  . lisinopril (ZESTRIL) 5 MG tablet Take 1 tablet (5 mg total) by mouth daily. 90 tablet 3  . mirtazapine (REMERON) 7.5 MG tablet Take 1 tablet (7.5 mg total) by mouth at bedtime. 90 tablet 3  . Na Sulfate-K Sulfate-Mg Sulf 17.5-3.13-1.6 GM/177ML SOLN At 5 PM the day before procedure take 1 bottle and 5 hours before procedure take 1 bottle.  354 mL 0  . raloxifene (EVISTA) 60 MG tablet TAKE 1 TABLET BY MOUTH EVERY DAY 90 tablet 3  . risperiDONE (RISPERDAL) 2 MG tablet TAKE 1/2 TO 1 TABLET BY MOUTH AT BEDTIME 30 tablet 5  . sulfamethoxazole-trimethoprim (BACTRIM DS) 800-160 MG tablet Take 1 tablet by mouth 2 (two) times daily. 10 tablet 0  . tiZANidine (ZANAFLEX) 4 MG tablet TAKE 1 TABLET BY MOUTH EVERY 6 HOURS AS NEEDED FOR MUSCLE SPASMS. 60 tablet 2  . lidocaine (LIDODERM) 5 % Place 1 patch onto the skin daily. Remove & Discard patch within 12 hours or as directed by MD (Patient not taking: Reported on 08/17/2020) 30 patch 0   No current facility-administered medications for this visit.    OBJECTIVE: Vitals:   08/31/20 1023  BP: 109/84  Pulse: 73  Resp: 20  Temp: 97.8 F (36.6 C)  SpO2: 100%     Body mass index is 23.99 kg/m.    ECOG FS:0 - Asymptomatic  General: Well-developed, well-nourished, no acute distress. Eyes: Pink conjunctiva, anicteric sclera. HEENT: Normocephalic, moist mucous membranes. Lungs: No audible wheezing or coughing. Heart: Regular rate and rhythm. Abdomen: Soft, nontender, no obvious distention. Musculoskeletal: No edema, cyanosis, or clubbing. Neuro: Alert, answering all questions appropriately. Cranial nerves grossly intact. Skin: No rashes or petechiae noted. Psych: Normal affect.    LAB RESULTS:  Lab Results  Component Value Date   NA 137 08/31/2020   K 4.0 08/31/2020   CL 107 08/31/2020   CO2 22 08/31/2020   GLUCOSE 105 (H) 08/31/2020   BUN 17 08/31/2020   CREATININE 1.62 (H) 08/31/2020   CALCIUM 9.1 08/31/2020   PROT 7.1 08/31/2020   ALBUMIN 3.8 08/31/2020   AST 15 08/31/2020   ALT 8 08/31/2020   ALKPHOS 60 08/31/2020   BILITOT 1.0 08/31/2020   GFRNONAA 34 (L) 08/31/2020   GFRAA 35 (L) 02/29/2020    Lab Results  Component Value Date   WBC 6.4 08/31/2020   NEUTROABS 3.8 08/31/2020   HGB 11.2 (L) 08/31/2020   HCT 33.9 (L) 08/31/2020   MCV 91.1 08/31/2020   PLT 103  (L) 08/31/2020     STUDIES: DG Thoracic Spine W/Swimmers  Result Date: 08/08/2020 CLINICAL DATA:  69 year old female status post fall 2 weeks ago. Pain radiating down the right side of the back to the right leg. No prior surgery. EXAM: THORACIC SPINE - 3 VIEWS COMPARISON:  Chest CT 02/03/2017. FINDINGS: Normal thoracic segmentation.  Chronic upper thoracic scoliosis. A mild T12 superior endplate compression fracture is new since 2018, although age indeterminate. The height and alignment of other thoracic levels appears stable since 2018. L1 appears intact. Cervicothoracic junction alignment is within normal limits. Grossly intact posterior ribs. Diffuse severe chronic thoracic disc and endplate degeneration. Visible chest and abdominal visceral contours appear stable, chronically tortuous thoracic aorta. IMPRESSION: 1. Mild  T12 superior endplate compression fracture, new since 2018 but age indeterminate. If specific therapy such as vertebroplasty is desired, Lumbar MRI or Nuclear Medicine Whole-body Bone Scan would best determine acuity. 2. No other acute osseous abnormality identified in the thoracic spine. Chronic thoracic scoliosis, severe disc and endplate degeneration. Electronically Signed   By: Genevie Ann M.D.   On: 08/08/2020 16:07   DG Lumbar Spine Complete  Result Date: 08/08/2020 CLINICAL DATA:  69 year old female status post fall 2 weeks ago. Pain radiating down the right side of the back to the right leg. No prior surgery. EXAM: LUMBAR SPINE - COMPLETE 4+ VIEW COMPARISON:  Thoracic spine radiographs today reported separately. Lumbar radiographs 03/10/2017. FINDINGS: Normal lumbar segmentation, concordant with the thoracic spine numbering today. T12 superior endplate compression fracture, reported separately. Stable lumbar vertebral height and alignment since 2018. Chronic straightening of lordosis. Chronic severe L3-L4 through L5-S1 disc and endplate degeneration, with progressed vacuum disc  since 2018. No pars fracture. Visible sacrum and SI joints appear stable and intact. Calcified aortic atherosclerosis. Small probable calcified uterine fibroids in the central pelvis are stable. Negative other abdominal visceral contours. IMPRESSION: 1. No acute osseous abnormality identified in the lumbar spine. See thoracic spine today. 2. Chronic severe L3-L4 through L5-S1 disc and endplate degeneration with progression since 2018. 3.  Calcified aortic atherosclerosis. Electronically Signed   By: Genevie Ann M.D.   On: 08/08/2020 16:09    ASSESSMENT: MGUS and ITP.  PLAN:    1.  ITP: Previously, bone marrow biopsy confirmed the results.  Patient platelet count has mildly improved and is now greater than 100.  No intervention is needed at this time.   She last received Rituxan in September 2013.  Patient had a response to prednisone, but it was not durable.  Return to clinic in 3 months with repeat laboratory work and further evaluation. 2. MGUS:  Bone marrow biopsy revealed less than 5% plasma cells.  His recent M spike was stable at 0.7.  Repeat SPEP from today is pending.  Return to clinic as above with repeat laboratory work and further evaluation. 3. Weight loss: Although patient's weight is decreased from earlier this year, it has increased several pounds in the last month.  Repeat laboratory work as above.  Consider CT scans in the future if weight loss continues.  I spent a total of 30 minutes reviewing chart data, face-to-face evaluation with the patient, counseling and coordination of care as detailed above.   Patient expressed understanding and was in agreement with this plan. She also understands that She can call clinic at any time with any questions, concerns, or complaints.    Lloyd Huger, MD   08/31/2020 12:31 PM

## 2020-08-28 ENCOUNTER — Telehealth: Payer: Self-pay | Admitting: General Practice

## 2020-08-28 NOTE — Telephone Encounter (Signed)
°  Chronic Care Management   Outreach Note  08/28/2020 Name: Denise Everett MRN: [id number] DOB: 21-Jun-1951  Referred by: Venita Lick, NP Reason for referral : Chronic Care Management (RNCM call to remind patient of her appointment on 08-31-2020 with Dr. Lanelle Bal to leave Voice maill)   An unsuccessful telephone outreach was attempted today. The patient was referred to the case management team for assistance with care management and care coordination. Was calling to remind the patient about her appointment on Friday with Dr. Grayland Ormond and to make sure the patient had adequate transportation. Unable to get the patient or leave a message. Will attempt to call the patient again on 08-29-2020.   Follow Up Plan: Telephone follow up appointment with care management team member scheduled for: 08-29-2020   Noreene Larsson RN, MSN, [facility] Mobile: [phone]

## 2020-08-29 ENCOUNTER — Telehealth: Payer: Self-pay | Admitting: General Practice

## 2020-08-29 NOTE — Telephone Encounter (Cosign Needed)
  Chronic Care Management   Note  08/29/2020 Name: Denise Everett MRN: [id number] DOB: October 16, 1951  Reached the patient on the 2nd attempt. The patient confirmed that she has adequate transportation to her appointment on Friday with Dr. Grayland Ormond.  The patient read back appointment information to the [facility].  She spoke to transportation yesterday and they confirmed her appointment and will have her at her MD appointment.    Follow up plan: The care management team will reach out to the patient again over the next 30 days.   Noreene Larsson RN, MSN, [facility] Mobile: [phone]

## 2020-08-30 ENCOUNTER — Other Ambulatory Visit: Payer: Self-pay | Admitting: *Deleted

## 2020-08-30 ENCOUNTER — Encounter: Payer: Self-pay | Admitting: Oncology

## 2020-08-30 DIAGNOSIS — D472 Monoclonal gammopathy: Secondary | ICD-10-CM

## 2020-08-31 ENCOUNTER — Encounter: Payer: Self-pay | Admitting: Oncology

## 2020-08-31 ENCOUNTER — Inpatient Hospital Stay (HOSPITAL_BASED_OUTPATIENT_CLINIC_OR_DEPARTMENT_OTHER): Payer: Medicare Other | Admitting: Oncology

## 2020-08-31 ENCOUNTER — Other Ambulatory Visit: Payer: Self-pay

## 2020-08-31 ENCOUNTER — Inpatient Hospital Stay: Payer: Medicare Other | Attending: Oncology

## 2020-08-31 VITALS — BP 109/84 | HR 73 | Temp 97.8°F | Resp 20 | Wt 118.8 lb

## 2020-08-31 DIAGNOSIS — D472 Monoclonal gammopathy: Secondary | ICD-10-CM

## 2020-08-31 DIAGNOSIS — Z87891 Personal history of nicotine dependence: Secondary | ICD-10-CM | POA: Diagnosis not present

## 2020-08-31 DIAGNOSIS — D693 Immune thrombocytopenic purpura: Secondary | ICD-10-CM | POA: Insufficient documentation

## 2020-08-31 DIAGNOSIS — E785 Hyperlipidemia, unspecified: Secondary | ICD-10-CM | POA: Diagnosis not present

## 2020-08-31 DIAGNOSIS — G894 Chronic pain syndrome: Secondary | ICD-10-CM | POA: Insufficient documentation

## 2020-08-31 DIAGNOSIS — I129 Hypertensive chronic kidney disease with stage 1 through stage 4 chronic kidney disease, or unspecified chronic kidney disease: Secondary | ICD-10-CM | POA: Insufficient documentation

## 2020-08-31 DIAGNOSIS — M810 Age-related osteoporosis without current pathological fracture: Secondary | ICD-10-CM | POA: Insufficient documentation

## 2020-08-31 DIAGNOSIS — Z79899 Other long term (current) drug therapy: Secondary | ICD-10-CM | POA: Insufficient documentation

## 2020-08-31 DIAGNOSIS — F209 Schizophrenia, unspecified: Secondary | ICD-10-CM | POA: Diagnosis not present

## 2020-08-31 DIAGNOSIS — R634 Abnormal weight loss: Secondary | ICD-10-CM | POA: Insufficient documentation

## 2020-08-31 DIAGNOSIS — N189 Chronic kidney disease, unspecified: Secondary | ICD-10-CM | POA: Diagnosis not present

## 2020-08-31 LAB — CBC WITH DIFFERENTIAL/PLATELET
Abs Immature Granulocytes: 0.07 10*3/uL (ref 0.00–0.07)
Basophils Absolute: 0 10*3/uL (ref 0.0–0.1)
Basophils Relative: 1 %
Eosinophils Absolute: 0.2 10*3/uL (ref 0.0–0.5)
Eosinophils Relative: 4 %
HCT: 33.9 % — ABNORMAL LOW (ref 36.0–46.0)
Hemoglobin: 11.2 g/dL — ABNORMAL LOW (ref 12.0–15.0)
Immature Granulocytes: 1 %
Lymphocytes Relative: 28 %
Lymphs Abs: 1.8 10*3/uL (ref 0.7–4.0)
MCH: 30.1 pg (ref 26.0–34.0)
MCHC: 33 g/dL (ref 30.0–36.0)
MCV: 91.1 fL (ref 80.0–100.0)
Monocytes Absolute: 0.5 10*3/uL (ref 0.1–1.0)
Monocytes Relative: 8 %
Neutro Abs: 3.8 10*3/uL (ref 1.7–7.7)
Neutrophils Relative %: 58 %
Platelets: 103 10*3/uL — ABNORMAL LOW (ref 150–400)
RBC: 3.72 MIL/uL — ABNORMAL LOW (ref 3.87–5.11)
RDW: 15 % (ref 11.5–15.5)
WBC: 6.4 10*3/uL (ref 4.0–10.5)
nRBC: 0 % (ref 0.0–0.2)

## 2020-08-31 LAB — COMPREHENSIVE METABOLIC PANEL
ALT: 8 U/L (ref 0–44)
AST: 15 U/L (ref 15–41)
Albumin: 3.8 g/dL (ref 3.5–5.0)
Alkaline Phosphatase: 60 U/L (ref 38–126)
Anion gap: 8 (ref 5–15)
BUN: 17 mg/dL (ref 8–23)
CO2: 22 mmol/L (ref 22–32)
Calcium: 9.1 mg/dL (ref 8.9–10.3)
Chloride: 107 mmol/L (ref 98–111)
Creatinine, Ser: 1.62 mg/dL — ABNORMAL HIGH (ref 0.44–1.00)
GFR, Estimated: 34 mL/min — ABNORMAL LOW (ref >60)
Glucose, Bld: 105 mg/dL — ABNORMAL HIGH (ref 70–99)
Potassium: 4 mmol/L (ref 3.5–5.1)
Sodium: 137 mmol/L (ref 135–145)
Total Bilirubin: 1 mg/dL (ref 0.3–1.2)
Total Protein: 7.1 g/dL (ref 6.5–8.1)

## 2020-09-01 LAB — IGG, IGA, IGM
IgA: 141 mg/dL (ref 64–422)
IgG (Immunoglobin G), Serum: 1357 mg/dL (ref 586–1602)
IgM (Immunoglobulin M), Srm: 154 mg/dL (ref 26–217)

## 2020-09-03 LAB — KAPPA/LAMBDA LIGHT CHAINS
Kappa free light chain: 95.6 mg/L — ABNORMAL HIGH (ref 3.3–19.4)
Kappa, lambda light chain ratio: 1.78 — ABNORMAL HIGH (ref 0.26–1.65)
Lambda free light chains: 53.8 mg/L — ABNORMAL HIGH (ref 5.7–26.3)

## 2020-09-03 LAB — PROTEIN ELECTROPHORESIS, SERUM
A/G Ratio: 1.2 (ref 0.7–1.7)
Albumin ELP: 3.8 g/dL (ref 2.9–4.4)
Alpha-1-Globulin: 0.2 g/dL (ref 0.0–0.4)
Alpha-2-Globulin: 0.7 g/dL (ref 0.4–1.0)
Beta Globulin: 0.9 g/dL (ref 0.7–1.3)
Gamma Globulin: 1.4 g/dL (ref 0.4–1.8)
Globulin, Total: 3.1 g/dL (ref 2.2–3.9)
M-Spike, %: 1 g/dL — ABNORMAL HIGH
Total Protein ELP: 6.9 g/dL (ref 6.0–8.5)

## 2020-09-05 ENCOUNTER — Other Ambulatory Visit: Payer: Self-pay | Admitting: Nurse Practitioner

## 2020-10-12 ENCOUNTER — Telehealth: Payer: Self-pay | Admitting: General Practice

## 2020-10-12 ENCOUNTER — Ambulatory Visit: Payer: Self-pay | Admitting: General Practice

## 2020-10-12 DIAGNOSIS — F209 Schizophrenia, unspecified: Secondary | ICD-10-CM

## 2020-10-12 DIAGNOSIS — N1832 Chronic kidney disease, stage 3b: Secondary | ICD-10-CM

## 2020-10-12 DIAGNOSIS — R634 Abnormal weight loss: Secondary | ICD-10-CM

## 2020-10-12 DIAGNOSIS — E7800 Pure hypercholesterolemia, unspecified: Secondary | ICD-10-CM

## 2020-10-12 DIAGNOSIS — I10 Essential (primary) hypertension: Secondary | ICD-10-CM
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
## 2020-10-13 ENCOUNTER — Other Ambulatory Visit: Payer: Self-pay | Admitting: Nurse Practitioner

## 2020-10-20 ENCOUNTER — Encounter: Payer: Self-pay | Admitting: Nurse Practitioner

## 2020-10-20 DIAGNOSIS — J432 Centrilobular emphysema: Secondary | ICD-10-CM | POA: Insufficient documentation

## 2020-10-20 DIAGNOSIS — I700 Atherosclerosis of aorta: Secondary | ICD-10-CM | POA: Insufficient documentation

## 2020-10-22 ENCOUNTER — Other Ambulatory Visit: Payer: Self-pay | Admitting: Nurse Practitioner

## 2020-10-22 ENCOUNTER — Telehealth: Payer: Self-pay

## 2020-10-22 NOTE — Telephone Encounter (Signed)
Requested medication (s) are due for refill today: yes  Requested medication (s) are on the active medication list: yes  Last refill:  08/17/20 #60 with 2 refills  Future visit scheduled: yes  Notes to clinic:  Please review for refill. Refill not delegated per protocol.     Requested Prescriptions  Pending Prescriptions Disp Refills   tiZANidine (ZANAFLEX) 4 MG tablet [Pharmacy Med Name: TIZANIDINE HCL 4 MG TAB] 60 tablet 2    Sig: TAKE 1 TABLET BY MOUTH EVERY 6 HOURS AS NEEDED FOR MUSCLE SPASMS.      Not Delegated - Cardiovascular:  Alpha-2 Agonists - tizanidine Failed - 10/22/2020 10:26 AM      Failed - This refill cannot be delegated      Passed - Valid encounter within last 6 months    Recent Outpatient Visits           2 months ago Idiopathic thrombocytopenic purpura ([place])   [place], [place] T, NP   2 months ago Acute right-sided low back pain without sciatica   [place], NP   3 months ago Schizophrenia, unspecified type ([place])   [place], Jolene T, NP   5 months ago Abnormal weight loss   [place], [place] T, NP   6 months ago Weight loss   [employer], [place] T, NP       Future Appointments             Tomorrow Cannady, Barbaraann Faster, NP [place], [facility]   In 4 weeks  [employer], [facility]

## 2020-10-23 ENCOUNTER — Ambulatory Visit: Payer: Medicare Other | Admitting: Nurse Practitioner

## 2020-11-06 ENCOUNTER — Encounter: Payer: Self-pay | Admitting: Nurse Practitioner

## 2020-11-06 ENCOUNTER — Other Ambulatory Visit: Payer: Self-pay

## 2020-11-06 ENCOUNTER — Ambulatory Visit (INDEPENDENT_AMBULATORY_CARE_PROVIDER_SITE_OTHER): Payer: Medicare Other | Admitting: Nurse Practitioner

## 2020-11-06 VITALS — BP 99/63 | HR 73 | Temp 97.4°F | Ht 58.19 in | Wt 123.2 lb

## 2020-11-06 DIAGNOSIS — M5442 Lumbago with sciatica, left side: Secondary | ICD-10-CM

## 2020-11-06 DIAGNOSIS — F209 Schizophrenia, unspecified: Secondary | ICD-10-CM

## 2020-11-06 DIAGNOSIS — M5441 Lumbago with sciatica, right side: Secondary | ICD-10-CM

## 2020-11-06 DIAGNOSIS — D693 Immune thrombocytopenic purpura: Secondary | ICD-10-CM | POA: Diagnosis not present

## 2020-11-06 DIAGNOSIS — I10 Essential (primary) hypertension: Secondary | ICD-10-CM | POA: Diagnosis not present

## 2020-11-06 DIAGNOSIS — J432 Centrilobular emphysema: Secondary | ICD-10-CM

## 2020-11-06 DIAGNOSIS — R8281 Pyuria: Secondary | ICD-10-CM | POA: Diagnosis not present

## 2020-11-06 DIAGNOSIS — G8929 Other chronic pain: Secondary | ICD-10-CM

## 2020-11-06 DIAGNOSIS — Z87891 Personal history of nicotine dependence: Secondary | ICD-10-CM

## 2020-11-06 DIAGNOSIS — N1832 Chronic kidney disease, stage 3b: Secondary | ICD-10-CM | POA: Diagnosis not present

## 2020-11-06 DIAGNOSIS — R634 Abnormal weight loss: Secondary | ICD-10-CM

## 2020-11-06 DIAGNOSIS — I700 Atherosclerosis of aorta: Secondary | ICD-10-CM | POA: Diagnosis not present

## 2020-11-06 DIAGNOSIS — D472 Monoclonal gammopathy: Secondary | ICD-10-CM

## 2020-11-06 MED ORDER — MIRTAZAPINE 7.5 MG PO TABS
7.5000 mg | ORAL_TABLET | Freq: Every day | ORAL | 3 refills | Status: DC
Start: 2020-11-06 — End: 2020-12-18

## 2020-11-06 MED ORDER — ONDANSETRON 4 MG PO TBDP: 4.0000 mg | ORAL_TABLET | Freq: Three times a day (TID) | ORAL | 0 refills | Status: DC | PRN

## 2020-11-06 NOTE — Patient Instructions (Signed)
Healthy Eating Following a healthy eating pattern may help you to achieve and maintain a healthy body weight, reduce the risk of chronic disease, and live a long and productive life. It is important to follow a healthy eating pattern at an appropriate calorie level for your body. Your nutritional needs should be met primarily through food by choosing a variety of nutrient-rich foods. What are tips for following this plan? Reading food labels  Read labels and choose the following: ? Reduced or low sodium. ? Juices with 100% fruit juice. ? Foods with low saturated fats and high polyunsaturated and monounsaturated fats. ? Foods with whole grains, such as whole wheat, cracked wheat, brown rice, and wild rice. ? Whole grains that are fortified with folic acid. This is recommended for women who are pregnant or who want to become pregnant.  Read labels and avoid the following: ? Foods with a lot of added sugars. These include foods that contain brown sugar, corn sweetener, corn syrup, dextrose, fructose, glucose, high-fructose corn syrup, honey, invert sugar, lactose, malt syrup, maltose, molasses, raw sugar, sucrose, trehalose, or turbinado sugar.  Do not eat more than the following amounts of added sugar per day:  6 teaspoons (25 g) for women.  9 teaspoons (38 g) for men. ? Foods that contain processed or refined starches and grains. ? Refined grain products, such as white flour, degermed cornmeal, white bread, and white rice. Shopping  Choose nutrient-rich snacks, such as vegetables, whole fruits, and nuts. Avoid high-calorie and high-sugar snacks, such as potato chips, fruit snacks, and candy.  Use oil-based dressings and spreads on foods instead of solid fats such as butter, stick margarine, or cream cheese.  Limit pre-made sauces, mixes, and "instant" products such as flavored rice, instant noodles, and ready-made pasta.  Try more plant-protein sources, such as tofu, tempeh, black beans,  edamame, lentils, nuts, and seeds.  Explore eating plans such as the Mediterranean diet or vegetarian diet. Cooking  Use oil to saut or stir-fry foods instead of solid fats such as butter, stick margarine, or lard.  Try baking, boiling, grilling, or broiling instead of frying.  Remove the fatty part of meats before cooking.  Steam vegetables in water or broth. Meal planning   At meals, imagine dividing your plate into fourths: ? One-half of your plate is fruits and vegetables. ? One-fourth of your plate is whole grains. ? One-fourth of your plate is protein, especially lean meats, poultry, eggs, tofu, beans, or nuts.  Include low-fat dairy as part of your daily diet. Lifestyle  Choose healthy options in all settings, including home, work, school, restaurants, or stores.  Prepare your food safely: ? Wash your hands after handling raw meats. ? Keep food preparation surfaces clean by regularly washing with hot, soapy water. ? Keep raw meats separate from ready-to-eat foods, such as fruits and vegetables. ? Cook seafood, meat, poultry, and eggs to the recommended internal temperature. ? Store foods at safe temperatures. In general:  Keep cold foods at 59F (4.4C) or below.  Keep hot foods at 159F (60C) or above.  Keep your freezer at [facility] (-17.8C) or below.  Foods are no longer safe to eat when they have been between the temperatures of 40-159F (4.4-60C) for more than 2 hours. What foods should I eat? Fruits Aim to eat 2 cup-equivalents of fresh, canned (in natural juice), or frozen fruits each day. Examples of 1 cup-equivalent of fruit include 1 small apple, 8 large strawberries, 1 cup canned fruit,  cup  dried fruit, or 1 cup 100% juice. Vegetables Aim to eat 2-3 cup-equivalents of fresh and frozen vegetables each day, including different varieties and colors. Examples of 1 cup-equivalent of vegetables include 2 medium carrots, 2 cups raw, leafy greens, 1 cup chopped  vegetable (raw or cooked), or 1 medium baked potato. Grains Aim to eat 6 ounce-equivalents of whole grains each day. Examples of 1 ounce-equivalent of grains include 1 slice of bread, 1 cup ready-to-eat cereal, 3 cups popcorn, or  cup cooked rice, pasta, or cereal. Meats and other proteins Aim to eat 5-6 ounce-equivalents of protein each day. Examples of 1 ounce-equivalent of protein include 1 egg, 1/2 cup nuts or seeds, or 1 tablespoon (16 g) peanut butter. A cut of meat or fish that is the size of a deck of cards is about 3-4 ounce-equivalents.  Of the protein you eat each week, try to have at least 8 ounces come from seafood. This includes salmon, trout, herring, and anchovies. Dairy Aim to eat 3 cup-equivalents of fat-free or low-fat dairy each day. Examples of 1 cup-equivalent of dairy include 1 cup (240 mL) milk, 8 ounces (250 g) yogurt, 1 ounces (44 g) natural cheese, or 1 cup (240 mL) fortified soy milk. Fats and oils  Aim for about 5 teaspoons (21 g) per day. Choose monounsaturated fats, such as canola and olive oils, avocados, peanut butter, and most nuts, or polyunsaturated fats, such as sunflower, corn, and soybean oils, walnuts, pine nuts, sesame seeds, sunflower seeds, and flaxseed. Beverages  Aim for six 8-oz glasses of water per day. Limit coffee to three to five 8-oz cups per day.  Limit caffeinated beverages that have added calories, such as soda and energy drinks.  Limit alcohol intake to no more than 1 drink a day for nonpregnant women and 2 drinks a day for men. One drink equals 12 oz of beer (355 mL), 5 oz of wine (148 mL), or 1 oz of hard liquor (44 mL). Seasoning and other foods  Avoid adding excess amounts of salt to your foods. Try flavoring foods with herbs and spices instead of salt.  Avoid adding sugar to foods.  Try using oil-based dressings, sauces, and spreads instead of solid fats. This information is based on general [place] nutrition guidelines. For more  information, visit [employer]. Exact amounts may vary based on your nutrition needs. Summary  A healthy eating plan may help you to maintain a healthy weight, reduce the risk of chronic diseases, and stay active throughout your life.  Plan your meals. Make sure you eat the right portions of a variety of nutrient-rich foods.  Try baking, boiling, grilling, or broiling instead of frying.  Choose healthy options in all settings, including home, work, school, restaurants, or stores. This information is not intended to replace advice given to you by your health care provider. Make sure you discuss any questions you have with your health care provider. Document Revised: 02/08/2018 Document Reviewed: 02/08/2018 Elsevier Patient Education  [place].

## 2020-11-06 NOTE — Assessment & Plan Note (Addendum)
Chronic, ongoing.  Continue current medication regimen and collaboration with nephrology.  CMP today.  Recommend she schedule follow-up with nephrology in next month.  CCM collaboration continues.  UA 2+ BLD today and trace bacteria, will send for culture.

## 2020-11-06 NOTE — Assessment & Plan Note (Addendum)
Chronic, ongoing with BP well below goal today. Continue current medication regimen and adjust as needed.  CMP and TSH today.  Recommend she check BP at home at least 3 mornings a week and document.  DASH diet focus.  If ongoing lower reading next visit, consider discontinuation or reduction to 2.5 MG of Lisinopril.  Return in 6 weeks.

## 2020-11-06 NOTE — Assessment & Plan Note (Signed)
Ongoing, continue collaboration with oncology.  Continue to collaborate with CCM team on transportation + connected care.  CBC up to date with oncology recently.  Return in 6 weeks.

## 2020-11-06 NOTE — Assessment & Plan Note (Signed)
Chronic, ongoing.  Continue current medication regimen, Risperdal, and adjust as needed.  Has been on this regimen for years with benefit.  Does have underlying paranoia at baseline.

## 2020-11-06 NOTE — Assessment & Plan Note (Signed)
Lung CT order placed due to weight loss and appetite changes, discussed at length with patient need to obtain this, even though improving.  Recommend continued cessation of smoking.

## 2020-11-06 NOTE — Assessment & Plan Note (Signed)
Ongoing issue, previously followed by pain management years ago.  Continue Tizanidine as needed + may use OTC creams like Icy/Hot or Bengay.  Recommend alternating heat and ice as needed.  Zofran script sent for nausea with back pain on occasion -- she reports this is minimal and has been issue for years.  Return in 6 weeks.  Did have 2+ blood on UA this visit, recheck next visit.  Consider imaging next visit if ongoing blood noted -- although unsure she will attend imaging.

## 2020-11-06 NOTE — Progress Notes (Signed)
BP 99/63   Pulse 73   Temp (!) 97.4 F (36.3 C) (Oral)   Ht 4' 10.19" (1.478 m)   Wt 123 lb 3.2 oz (55.9 kg)   LMP  (LMP Unknown)   SpO2 100%   BMI 25.58 kg/m    Subjective:    Patient ID: Denise Everett, female    DOB: 1951-06-24, 69 y.o.   MRN: [id number]  HPI: Denise Everett is a 69 y.o. female  Chief Complaint  Patient presents with  . Weight Check  . Fatigue  . Nausea    WEIGHT LOSS: Reports feeling better now that started Mirtazapine, appetite improved.  She reports her pills for appetite have been missing though, she thinks her niece stole them and has been giving her wrong medications -- niece puts all her pills together.  She reports not wanting adult protective services out to home, discussed this with her today -- states her niece is not helping her anymore in new year.  Continues to gain weight on this visit, has gained 6 pounds since October.Currently eating two meals a day, more than previous, does eat snacks in between.Takes occasional TUMS for heart burn. Has not had colonoscopy, reports she was told due to not having help to pick her up she could not go.  She saw GI on 05/01/20. Have been working with CCM team on assistance with this, but patient does endorse her phone does not always work.  She denies night sweats, fever, decreased appetite at this time.  Denies SOB or CP. History of smoking, started at 13 and quit 4 years ago. Had initial lung CT screening in 2018 which showed emphysema & Aortic atherosclerosis and was to repeat in 12 months, missed screening-- she was to go for abdominal and lung CT imaging which PCP ordered, but has not attended yet -- reports "I keep forgetting". IFOBT negative 02/16/20. Followed by Dr. Grayland Ormond for MGUS and ITP, did see for follow-up on 08/31/20 and is to return in 3 months -- last CBC inOctober PLT 103. She reports fatigue and nausea today, started just a few minutes ago while sitting in office--states her chronic back  pain makes her nauseous at times  Nausea not constant issue, just recently noticed when her back hurt at office.   She denies any alcohol or drug use at home. Fever: no Nausea: no Vomiting: none Weight loss: yes Decreased appetite: yes Diarrhea: no Constipation: no Blood in stool: no Heartburn: yes Jaundice: no Rash: no Dysuria/urinary frequency: no Hematuria: no History of sexually transmitted disease: no Recurrent NSAID use: no  Relevant past medical, surgical, family and social history reviewed and updated as indicated. Interim medical history since our last visit reviewed. Allergies and medications reviewed and updated.  Review of Systems  Constitutional: Negative for activity change, appetite change (improved), diaphoresis, fatigue and fever.  Respiratory: Negative for cough, chest tightness, shortness of breath and wheezing.   Cardiovascular: Negative for chest pain, palpitations and leg swelling.  Gastrointestinal: Negative for abdominal distention, abdominal pain, blood in stool, constipation, diarrhea, nausea and vomiting.  Neurological: Negative.   Psychiatric/Behavioral: Negative for decreased concentration, self-injury, sleep disturbance and suicidal ideas. The patient is not nervous/anxious.    Per HPI unless specifically indicated above     Objective:    BP 99/63   Pulse 73   Temp (!) 97.4 F (36.3 C) (Oral)   Ht 4' 10.19" (1.478 m)   Wt 123 lb 3.2 oz (55.9 kg)   LMP  (  LMP Unknown)   SpO2 100%   BMI 25.58 kg/m   Wt Readings from Last 3 Encounters:  11/06/20 123 lb 3.2 oz (55.9 kg)  08/31/20 118 lb 12.8 oz (53.9 kg)  08/17/20 117 lb 12.8 oz (53.4 kg)    Physical Exam Vitals and nursing note reviewed.  Constitutional:      General: She is awake. She is not in acute distress.    Appearance: She is well-developed and well-groomed. She is not ill-appearing.  HENT:     Head: Normocephalic.     Right Ear: Hearing normal.     Left Ear: Hearing normal.      Nose: Nose normal.     Mouth/Throat:     Mouth: Mucous membranes are moist.  Eyes:     General: Lids are normal.        Right eye: No discharge.        Left eye: No discharge.     Conjunctiva/sclera: Conjunctivae normal.     Pupils: Pupils are equal, round, and reactive to light.  Neck:     Thyroid: No thyromegaly.     Vascular: No carotid bruit.  Cardiovascular:     Rate and Rhythm: Normal rate and regular rhythm.     Heart sounds: Normal heart sounds. No murmur heard. No gallop.   Pulmonary:     Effort: Pulmonary effort is normal. No accessory muscle usage or respiratory distress.     Breath sounds: Normal breath sounds.  Abdominal:     General: Bowel sounds are normal. There is no distension or abdominal bruit.     Palpations: Abdomen is soft. There is no hepatomegaly or splenomegaly.     Tenderness: There is no abdominal tenderness.     Hernia: No hernia is present.  Musculoskeletal:     Cervical back: Normal range of motion and neck supple.     Lumbar back: No swelling, edema, spasms, tenderness or bony tenderness. Decreased range of motion.     Right lower leg: No edema.     Left lower leg: No edema.  Lymphadenopathy:     Head:     Right side of head: No submental, submandibular, tonsillar, preauricular or posterior auricular adenopathy.     Left side of head: No submental, submandibular, tonsillar, preauricular or posterior auricular adenopathy.     Cervical: No cervical adenopathy.  Skin:    General: Skin is warm and dry.  Neurological:     Mental Status: She is alert and oriented to person, place, and time.  Psychiatric:        Attention and Perception: Attention normal.        Mood and Affect: Mood normal.        Speech: Speech normal.        Behavior: Behavior normal. Behavior is cooperative.    Results for orders placed or performed in visit on 08/31/20  Protein electrophoresis, serum  Result Value Ref Range   Total Protein ELP 6.9 6.0 - 8.5 g/dL    Albumin ELP 3.8 2.9 - 4.4 g/dL   Alpha-1-Globulin 0.2 0.0 - 0.4 g/dL   Alpha-2-Globulin 0.7 0.4 - 1.0 g/dL   Beta Globulin 0.9 0.7 - 1.3 g/dL   Gamma Globulin 1.4 0.4 - 1.8 g/dL   M-Spike, % 1.0 (H) Not Observed g/dL   SPE Interp. Comment    Comment Comment    Globulin, Total 3.1 2.2 - 3.9 g/dL   A/G Ratio 1.2 0.7 - 1.7  Kappa/lambda light chains  Result Value Ref Range   Kappa free light chain 95.6 (H) 3.3 - 19.4 mg/L   Lamda free light chains 53.8 (H) 5.7 - 26.3 mg/L   Kappa, lamda light chain ratio 1.78 (H) 0.26 - 1.65  IgG, IgA, IgM  Result Value Ref Range   IgG (Immunoglobin G), Serum 1,357 586 - 1,602 mg/dL   IgA 141 64 - 422 mg/dL   IgM (Immunoglobulin M), Srm 154 26 - 217 mg/dL  Comprehensive metabolic panel  Result Value Ref Range   Sodium 137 135 - 145 mmol/L   Potassium 4.0 3.5 - 5.1 mmol/L   Chloride 107 98 - 111 mmol/L   CO2 22 22 - 32 mmol/L   Glucose, Bld 105 (H) 70 - 99 mg/dL   BUN 17 8 - 23 mg/dL   Creatinine, Ser 1.62 (H) 0.44 - 1.00 mg/dL   Calcium 9.1 8.9 - 10.3 mg/dL   Total Protein 7.1 6.5 - 8.1 g/dL   Albumin 3.8 3.5 - 5.0 g/dL   AST 15 15 - 41 U/L   ALT 8 0 - 44 U/L   Alkaline Phosphatase 60 38 - 126 U/L   Total Bilirubin 1.0 0.3 - 1.2 mg/dL   GFR, Estimated 34 (L) >60 mL/min   Anion gap 8 5 - 15  CBC with Differential/Platelet  Result Value Ref Range   WBC 6.4 4.0 - 10.5 K/uL   RBC 3.72 (L) 3.87 - 5.11 MIL/uL   Hemoglobin 11.2 (L) 12.0 - 15.0 g/dL   HCT 33.9 (L) 36.0 - 46.0 %   MCV 91.1 80.0 - 100.0 fL   MCH 30.1 26.0 - 34.0 pg   MCHC 33.0 30.0 - 36.0 g/dL   RDW 15.0 11.5 - 15.5 %   Platelets 103 (L) 150 - 400 K/uL   nRBC 0.0 0.0 - 0.2 %   Neutrophils Relative % 58 %   Neutro Abs 3.8 1.7 - 7.7 K/uL   Lymphocytes Relative 28 %   Lymphs Abs 1.8 0.7 - 4.0 K/uL   Monocytes Relative 8 %   Monocytes Absolute 0.5 0.1 - 1.0 K/uL   Eosinophils Relative 4 %   Eosinophils Absolute 0.2 0.0 - 0.5 K/uL   Basophils Relative 1 %   Basophils Absolute  0.0 0.0 - 0.1 K/uL   Immature Granulocytes 1 %   Abs Immature Granulocytes 0.07 0.00 - 0.07 K/uL      Assessment & Plan:   Problem List Items Addressed This Visit      Cardiovascular and Mediastinum   Hypertension    Chronic, ongoing with BP well below goal today. Continue current medication regimen and adjust as needed.  CMP and TSH today.  Recommend she check BP at home at least 3 mornings a week and document.  DASH diet focus.  If ongoing lower reading next visit, consider discontinuation or reduction to 2.5 MG of Lisinopril.  Return in 6 weeks.      Relevant Orders   Lipid Panel w/o Chol/HDL Ratio   Aortic atherosclerosis ([place])    Noted on CT imaging 08/07/20.  Continue statin for prevention.  Recommend continued cessation of smoking.  No ASA due to ITP.        Respiratory   Centrilobular emphysema ([place])    Noted on CT 02/03/2017.  Is a former smoker, reports quitting 4 years ago.  No current inhalers.  Would benefit from spirometry in future, refuses today.  Has repeat lung CT ordered, but has not attended or scheduled this  yet -- highly recommend she scheduled this as soon as possible.  Will continue to work closely with CCM team on assist.        Nervous and Auditory   Chronic low back pain (Location of Primary Source of Pain) (Bilateral) (R>L) (Chronic)    Ongoing issue, previously followed by pain management years ago.  Continue Tizanidine as needed + may use OTC creams like Icy/Hot or Bengay.  Recommend alternating heat and ice as needed.  Zofran script sent for nausea with back pain on occasion -- she reports this is minimal and has been issue for years.  Return in 6 weeks.  Did have 2+ blood on UA this visit, recheck next visit.  Consider imaging next visit if ongoing blood noted -- although unsure she will attend imaging.      Relevant Medications   mirtazapine (REMERON) 7.5 MG tablet     Genitourinary   CKD (chronic kidney disease) stage 3, GFR 30-59 ml/min ([facility])     Chronic, ongoing.  Continue current medication regimen and collaboration with nephrology.  CMP today.  Recommend she schedule follow-up with nephrology in next month.  CCM collaboration continues.  UA 2+ BLD today and trace bacteria, will send for culture.      Relevant Orders   Comprehensive metabolic panel     Hematopoietic and Hemostatic   Idiopathic thrombocytopenic purpura ([place]) (Chronic)    Continue to collaborate with hematology, recent note reviewed.  She denies bleeding or increased bruising.        Other   Schizophrenia ([place])    Chronic, ongoing.  Continue current medication regimen, Risperdal, and adjust as needed.  Has been on this regimen for years with benefit.  Does have underlying paranoia at baseline.      MGUS (monoclonal gammopathy of unknown significance)    Ongoing, continue collaboration with oncology.  Continue to collaborate with CCM team on transportation + connected care.  CBC up to date with oncology recently.  Return in 6 weeks.      Personal history of tobacco use, presenting hazards to health    Lung CT order placed due to weight loss and appetite changes, discussed at length with patient need to obtain this, even though improving.  Recommend continued cessation of smoking.      Abnormal weight loss - Primary    Weight gain continues and appetite reported to be improving -- ?related to her mood and schizophrenia -- does have some paranoid thought processes at baseline.  She denies any symptoms at this time, no B symptoms.  Has diagnosis of underlying MGUS and ITP, followed by oncology.  Continue collaboration with GI due to weight loss and history of smoking no previous colonoscopy -- had negative iFOBT.  Have recommended she obtain colonoscopy, has not obtained as of yet.  Highly recommend she still obtain lung and abdominal CT, even though some improvement present.  Will continue to work with CCM team on assistance needs, patient is very difficult to get in  touch with and does not always follow direction -- appears to have low support per patient report.  She has concerns about her family not providing her medications correctly, but also refuses APS assessment -- will discuss further with CCM team. Return in 6 weeks.      Relevant Orders   TSH   Urinalysis, Routine w reflex microscopic    Other Visit Diagnoses    Pyuria       Urine for culture.  Recheck next  visit.   Relevant Orders   Urine Culture       Follow up plan: Return in about 6 weeks (around 12/18/2020) for Weight check.

## 2020-11-06 NOTE — Assessment & Plan Note (Signed)
Noted on CT 02/03/2017.  Is a former smoker, reports quitting 4 years ago.  No current inhalers.  Would benefit from spirometry in future, refuses today.  Has repeat lung CT ordered, but has not attended or scheduled this yet -- highly recommend she scheduled this as soon as possible.  Will continue to work closely with CCM team on assist.

## 2020-11-06 NOTE — Assessment & Plan Note (Signed)
Weight gain continues and appetite reported to be improving -- ?related to her mood and schizophrenia -- does have some paranoid thought processes at baseline.  She denies any symptoms at this time, no B symptoms.  Has diagnosis of underlying MGUS and ITP, followed by oncology.  Continue collaboration with GI due to weight loss and history of smoking no previous colonoscopy -- had negative iFOBT.  Have recommended she obtain colonoscopy, has not obtained as of yet.  Highly recommend she still obtain lung and abdominal CT, even though some improvement present.  Will continue to work with CCM team on assistance needs, patient is very difficult to get in touch with and does not always follow direction -- appears to have low support per patient report.  She has concerns about her family not providing her medications correctly, but also refuses APS assessment -- will discuss further with CCM team. Return in 6 weeks.

## 2020-11-06 NOTE — Assessment & Plan Note (Signed)
Continue to collaborate with hematology, recent note reviewed.  She denies bleeding or increased bruising.  ?

## 2020-11-06 NOTE — Assessment & Plan Note (Addendum)
Noted on CT imaging 08/07/20.  Continue statin for prevention.  Recommend continued cessation of smoking.  No ASA due to ITP. ?

## 2020-11-07 ENCOUNTER — Other Ambulatory Visit: Payer: Self-pay | Admitting: Nurse Practitioner

## 2020-11-07 LAB — MICROSCOPIC EXAMINATION

## 2020-11-07 LAB — URINALYSIS, ROUTINE W REFLEX MICROSCOPIC
Bilirubin, UA: NEGATIVE
Glucose, UA: NEGATIVE
Ketones, UA: NEGATIVE
Leukocytes,UA: NEGATIVE
Nitrite, UA: NEGATIVE
Protein,UA: NEGATIVE
Specific Gravity, UA: 1.01 (ref 1.005–1.030)
Urobilinogen, Ur: 0.2 mg/dL (ref 0.2–1.0)
pH, UA: 5 (ref 5.0–7.5)

## 2020-11-07 LAB — COMPREHENSIVE METABOLIC PANEL
ALT: 3 IU/L (ref 0–32)
AST: 9 IU/L (ref 0–40)
Albumin/Globulin Ratio: 1.5 (ref 1.2–2.2)
Albumin: 3.7 g/dL (ref 3.7–4.7)
Alkaline Phosphatase: 51 IU/L (ref 44–121)
BUN/Creatinine Ratio: 11 — ABNORMAL LOW (ref 12–28)
BUN: 26 mg/dL (ref 8–27)
Bilirubin Total: <0.2 mg/dL (ref 0.0–1.2)
CO2: 15 mmol/L — ABNORMAL LOW (ref 20–29)
Calcium: 8.3 mg/dL — ABNORMAL LOW (ref 8.7–10.3)
Chloride: 109 mmol/L — ABNORMAL HIGH (ref 96–106)
Creatinine, Ser: 2.32 mg/dL — ABNORMAL HIGH (ref 0.57–1.00)
GFR calc Af Amer: 24 mL/min/{1.73_m2} — ABNORMAL LOW (ref >59)
GFR calc non Af Amer: 21 mL/min/{1.73_m2} — ABNORMAL LOW (ref >59)
Globulin, Total: 2.4 g/dL (ref 1.5–4.5)
Glucose: 93 mg/dL (ref 65–99)
Potassium: 4.3 mmol/L (ref 3.5–5.2)
Sodium: 138 mmol/L (ref 134–144)
Total Protein: 6.1 g/dL (ref 6.0–8.5)

## 2020-11-07 LAB — LIPID PANEL W/O CHOL/HDL RATIO
Cholesterol, Total: 147 mg/dL (ref 100–199)
HDL: 40 mg/dL (ref >39)
LDL Chol Calc (NIH): 92 mg/dL (ref 0–99)
Triglycerides: 76 mg/dL (ref 0–149)
VLDL Cholesterol Cal: 15 mg/dL (ref 5–40)

## 2020-11-07 LAB — TSH: TSH: 0.963 u[IU]/mL (ref 0.450–4.500)

## 2020-11-07 MED ORDER — GABAPENTIN 300 MG PO CAPS
300.0000 mg | ORAL_CAPSULE | Freq: Two times a day (BID) | ORAL | 3 refills | Status: DC
Start: 2020-11-07 — End: 2021-03-25

## 2020-11-07 NOTE — Progress Notes (Signed)
Please let Lu know via phone if possible, may send letter if no answer: - Thyroid lab is normal - Cholesterol levels are stable. - There was a little blood in urine I would like to recheck this next visit - Kidney function showing some decline this visit, you were supposed to return to see Dr. Holley Raring in November, your kidney doctor, please ensure you schedule this as soon as possible.  This is important, especially with recent decline.  I also want you to ensure good water intake daily.  We also need to lower your Gabapentin dosing to twice a day, not three times a day.  Any questions? Keep being awesome!!  Thank you for allowing me to participate in your care. Kindest regards, Reshard Guillet

## 2020-11-08 LAB — URINE CULTURE

## 2020-11-15 ENCOUNTER — Telehealth: Payer: Self-pay | Admitting: Nurse Practitioner

## 2020-11-15 NOTE — Telephone Encounter (Signed)
Copied from [place] [phone]. Topic: Medicare AWV >> Nov 15, 2020  3:22 PM Weston Anna wrote: Reason for CRM:  11/15/20 No answer -tried contacting patient to notify AWVS will be by phone not in office -srs

## 2020-11-19 ENCOUNTER — Ambulatory Visit (INDEPENDENT_AMBULATORY_CARE_PROVIDER_SITE_OTHER): Payer: Medicare Other

## 2020-11-19 VITALS — Ht <= 58 in | Wt 123.0 lb

## 2020-11-19 DIAGNOSIS — Z0000 Encounter for general adult medical examination without abnormal findings: Secondary | ICD-10-CM

## 2020-11-19 NOTE — Patient Instructions (Signed)
Denise Everett , Thank you for taking time to come for your Medicare Wellness Visit. I appreciate your ongoing commitment to your health goals. Please review the following plan we discussed and let me know if I can assist you in the future.   Screening recommendations/referrals: Colonoscopy: FOBT 02/16/2020, due 02/15/2021 Mammogram: patient to schedule Bone Density: completed 04/17/2010 Recommended yearly ophthalmology/optometry visit for glaucoma screening and checkup Recommended yearly dental visit for hygiene and checkup  Vaccinations: Influenza vaccine: completed 08/06/2020, due 06/10/2021 Pneumococcal vaccine: completed 01/01/2016 Tdap vaccine: due Shingles vaccine: discussed   Covid-19: 03/08/2020, 02/16/2020  Advanced directives: Advance directive discussed with you today.   Conditions/risks identified: none  Next appointment: Follow up in one year for your annual wellness visit    Preventive Care 65 Years and Older, Female Preventive care refers to lifestyle choices and visits with your health care provider that can promote health and wellness. What does preventive care include?  A yearly physical exam. This is also called an annual well check.  Dental exams once or twice a year.  Routine eye exams. Ask your health care provider how often you should have your eyes checked.  Personal lifestyle choices, including:  Daily care of your teeth and gums.  Regular physical activity.  Eating a healthy diet.  Avoiding tobacco and drug use.  Limiting alcohol use.  Practicing safe sex.  Taking low-dose aspirin every day.  Taking vitamin and mineral supplements as recommended by your health care provider. What happens during an annual well check? The services and screenings done by your health care provider during your annual well check will depend on your age, overall health, lifestyle risk factors, and family history of disease. Counseling  Your health care provider may ask you  questions about your:  Alcohol use.  Tobacco use.  Drug use.  Emotional well-being.  Home and relationship well-being.  Sexual activity.  Eating habits.  History of falls.  Memory and ability to understand (cognition).  Work and work Statistician.  Reproductive health. Screening  You may have the following tests or measurements:  Height, weight, and BMI.  Blood pressure.  Lipid and cholesterol levels. These may be checked every 5 years, or more frequently if you are over 78 years old.  Skin check.  Lung cancer screening. You may have this screening every year starting at age 43 if you have a 30-pack-year history of smoking and currently smoke or have quit within the past 15 years.  Fecal occult blood test (FOBT) of the stool. You may have this test every year starting at age 27.  Flexible sigmoidoscopy or colonoscopy. You may have a sigmoidoscopy every 5 years or a colonoscopy every 10 years starting at age 58.  Hepatitis C blood test.  Hepatitis B blood test.  Sexually transmitted disease (STD) testing.  Diabetes screening. This is done by checking your blood sugar (glucose) after you have not eaten for a while (fasting). You may have this done every 1-3 years.  Bone density scan. This is done to screen for osteoporosis. You may have this done starting at age 78.  Mammogram. This may be done every 1-2 years. Talk to your health care provider about how often you should have regular mammograms. Talk with your health care provider about your test results, treatment options, and if necessary, the need for more tests. Vaccines  Your health care provider may recommend certain vaccines, such as:  Influenza vaccine. This is recommended every year.  Tetanus, diphtheria, and acellular pertussis (  Tdap, Td) vaccine. You may need a Td booster every 10 years.  Zoster vaccine. You may need this after age 33.  Pneumococcal 13-valent conjugate (PCV13) vaccine. One dose is  recommended after age 31.  Pneumococcal polysaccharide (PPSV23) vaccine. One dose is recommended after age 63. Talk to your health care provider about which screenings and vaccines you need and how often you need them. This information is not intended to replace advice given to you by your health care provider. Make sure you discuss any questions you have with your health care provider. Document Released: 11/23/2015 Document Revised: 07/16/2016 Document Reviewed: 08/28/2015 Elsevier Interactive Patient Education  2017 [place] Prevention in the Home Falls can cause injuries. They can happen to people of all ages. There are many things you can do to make your home safe and to help prevent falls. What can I do on the outside of my home?  Regularly fix the edges of walkways and driveways and fix any cracks.  Remove anything that might make you trip as you walk through a door, such as a raised step or threshold.  Trim any bushes or trees on the path to your home.  Use bright outdoor lighting.  Clear any walking paths of anything that might make someone trip, such as rocks or tools.  Regularly check to see if handrails are loose or broken. Make sure that both sides of any steps have handrails.  Any raised decks and porches should have guardrails on the edges.  Have any leaves, snow, or ice cleared regularly.  Use sand or salt on walking paths during winter.  Clean up any spills in your garage right away. This includes oil or grease spills. What can I do in the bathroom?  Use night lights.  Install grab bars by the toilet and in the tub and shower. Do not use towel bars as grab bars.  Use non-skid mats or decals in the tub or shower.  If you need to sit down in the shower, use a plastic, non-slip stool.  Keep the floor dry. Clean up any water that spills on the floor as soon as it happens.  Remove soap buildup in the tub or shower regularly.  Attach bath mats  securely with double-sided non-slip rug tape.  Do not have throw rugs and other things on the floor that can make you trip. What can I do in the bedroom?  Use night lights.  Make sure that you have a light by your bed that is easy to reach.  Do not use any sheets or blankets that are too big for your bed. They should not hang down onto the floor.  Have a firm chair that has side arms. You can use this for support while you get dressed.  Do not have throw rugs and other things on the floor that can make you trip. What can I do in the kitchen?  Clean up any spills right away.  Avoid walking on wet floors.  Keep items that you use a lot in easy-to-reach places.  If you need to reach something above you, use a strong step stool that has a grab bar.  Keep electrical cords out of the way.  Do not use floor polish or wax that makes floors slippery. If you must use wax, use non-skid floor wax.  Do not have throw rugs and other things on the floor that can make you trip. What can I do with my stairs?  Do  not leave any items on the stairs.  Make sure that there are handrails on both sides of the stairs and use them. Fix handrails that are broken or loose. Make sure that handrails are as long as the stairways.  Check any carpeting to make sure that it is firmly attached to the stairs. Fix any carpet that is loose or worn.  Avoid having throw rugs at the top or bottom of the stairs. If you do have throw rugs, attach them to the floor with carpet tape.  Make sure that you have a light switch at the top of the stairs and the bottom of the stairs. If you do not have them, ask someone to add them for you. What else can I do to help prevent falls?  Wear shoes that:  Do not have high heels.  Have rubber bottoms.  Are comfortable and fit you well.  Are closed at the toe. Do not wear sandals.  If you use a stepladder:  Make sure that it is fully opened. Do not climb a closed  stepladder.  Make sure that both sides of the stepladder are locked into place.  Ask someone to hold it for you, if possible.  Clearly mark and make sure that you can see:  Any grab bars or handrails.  First and last steps.  Where the edge of each step is.  Use tools that help you move around (mobility aids) if they are needed. These include:  Canes.  Walkers.  Scooters.  Crutches.  Turn on the lights when you go into a dark area. Replace any light bulbs as soon as they burn out.  Set up your furniture so you have a clear path. Avoid moving your furniture around.  If any of your floors are uneven, fix them.  If there are any pets around you, be aware of where they are.  Review your medicines with your doctor. Some medicines can make you feel dizzy. This can increase your chance of falling. Ask your doctor what other things that you can do to help prevent falls. This information is not intended to replace advice given to you by your health care provider. Make sure you discuss any questions you have with your health care provider. Document Released: 08/23/2009 Document Revised: 04/03/2016 Document Reviewed: 12/01/2014 Elsevier Interactive Patient Education  2017 [employer].
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
## 2020-11-28 ENCOUNTER — Other Ambulatory Visit: Payer: Self-pay | Admitting: Nurse Practitioner

## 2020-11-30 ENCOUNTER — Ambulatory Visit: Payer: Medicare Other | Admitting: Licensed Clinical Social Worker

## 2020-11-30 DIAGNOSIS — R634 Abnormal weight loss: Secondary | ICD-10-CM

## 2020-11-30 DIAGNOSIS — N1832 Chronic kidney disease, stage 3b: Secondary | ICD-10-CM

## 2020-11-30 DIAGNOSIS — I10 Essential (primary) hypertension: Secondary | ICD-10-CM

## 2020-11-30 DIAGNOSIS — F209 Schizophrenia, unspecified: Secondary | ICD-10-CM

## 2020-11-30 DIAGNOSIS — M5441 Lumbago with sciatica, right side: Secondary | ICD-10-CM
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
## 2020-12-03 ENCOUNTER — Inpatient Hospital Stay: Payer: Medicare Other | Attending: Oncology

## 2020-12-07 ENCOUNTER — Telehealth: Payer: Self-pay

## 2020-12-10 ENCOUNTER — Inpatient Hospital Stay: Payer: Medicare Other | Admitting: Oncology

## 2020-12-10 ENCOUNTER — Ambulatory Visit: Payer: Medicare Other | Admitting: Oncology

## 2020-12-18 ENCOUNTER — Other Ambulatory Visit: Payer: Self-pay

## 2020-12-18 ENCOUNTER — Encounter: Payer: Self-pay | Admitting: Nurse Practitioner

## 2020-12-18 ENCOUNTER — Ambulatory Visit (INDEPENDENT_AMBULATORY_CARE_PROVIDER_SITE_OTHER): Payer: Medicare Other | Admitting: Nurse Practitioner

## 2020-12-18 VITALS — BP 116/79 | HR 69 | Temp 98.1°F | Ht 58.19 in | Wt 119.8 lb

## 2020-12-18 DIAGNOSIS — Z1211 Encounter for screening for malignant neoplasm of colon: Secondary | ICD-10-CM | POA: Diagnosis not present

## 2020-12-18 DIAGNOSIS — N1832 Chronic kidney disease, stage 3b: Secondary | ICD-10-CM

## 2020-12-18 DIAGNOSIS — I700 Atherosclerosis of aorta: Secondary | ICD-10-CM

## 2020-12-18 DIAGNOSIS — R634 Abnormal weight loss: Secondary | ICD-10-CM | POA: Diagnosis not present

## 2020-12-18 DIAGNOSIS — J432 Centrilobular emphysema: Secondary | ICD-10-CM | POA: Diagnosis not present

## 2020-12-18 DIAGNOSIS — F209 Schizophrenia, unspecified: Secondary | ICD-10-CM | POA: Diagnosis not present

## 2020-12-18 DIAGNOSIS — D693 Immune thrombocytopenic purpura: Secondary | ICD-10-CM

## 2020-12-18 DIAGNOSIS — I10 Essential (primary) hypertension: Secondary | ICD-10-CM | POA: Diagnosis not present

## 2020-12-18 MED ORDER — MIRTAZAPINE 15 MG PO TABS: 7.5000 mg | ORAL_TABLET | Freq: Every day | ORAL | 4 refills | Status: DC

## 2020-12-18 NOTE — Assessment & Plan Note (Signed)
Chronic, ongoing.  Continue current medication regimen and collaboration with nephrology.  CCM collaboration continues.  Return in 3 months.

## 2020-12-18 NOTE — Assessment & Plan Note (Signed)
Chronic, ongoing.  Continue current medication regimen, Risperdal, and adjust as needed.  Has been on this regimen for years with benefit.  Does have underlying paranoia at baseline.

## 2020-12-18 NOTE — Patient Instructions (Signed)
Healthy Eating Following a healthy eating pattern may help you to achieve and maintain a healthy body weight, reduce the risk of chronic disease, and live a long and productive life. It is important to follow a healthy eating pattern at an appropriate calorie level for your body. Your nutritional needs should be met primarily through food by choosing a variety of nutrient-rich foods. What are tips for following this plan? Reading food labels  Read labels and choose the following: ? Reduced or low sodium. ? Juices with 100% fruit juice. ? Foods with low saturated fats and high polyunsaturated and monounsaturated fats. ? Foods with whole grains, such as whole wheat, cracked wheat, brown rice, and wild rice. ? Whole grains that are fortified with folic acid. This is recommended for women who are pregnant or who want to become pregnant.  Read labels and avoid the following: ? Foods with a lot of added sugars. These include foods that contain brown sugar, corn sweetener, corn syrup, dextrose, fructose, glucose, high-fructose corn syrup, honey, invert sugar, lactose, malt syrup, maltose, molasses, raw sugar, sucrose, trehalose, or turbinado sugar.  Do not eat more than the following amounts of added sugar per day:  6 teaspoons (25 g) for women.  9 teaspoons (38 g) for men. ? Foods that contain processed or refined starches and grains. ? Refined grain products, such as white flour, degermed cornmeal, white bread, and white rice. Shopping  Choose nutrient-rich snacks, such as vegetables, whole fruits, and nuts. Avoid high-calorie and high-sugar snacks, such as potato chips, fruit snacks, and candy.  Use oil-based dressings and spreads on foods instead of solid fats such as butter, stick margarine, or cream cheese.  Limit pre-made sauces, mixes, and "instant" products such as flavored rice, instant noodles, and ready-made pasta.  Try more plant-protein sources, such as tofu, tempeh, black beans,  edamame, lentils, nuts, and seeds.  Explore eating plans such as the Mediterranean diet or vegetarian diet. Cooking  Use oil to saut or stir-fry foods instead of solid fats such as butter, stick margarine, or lard.  Try baking, boiling, grilling, or broiling instead of frying.  Remove the fatty part of meats before cooking.  Steam vegetables in water or broth. Meal planning  At meals, imagine dividing your plate into fourths: ? One-half of your plate is fruits and vegetables. ? One-fourth of your plate is whole grains. ? One-fourth of your plate is protein, especially lean meats, poultry, eggs, tofu, beans, or nuts.  Include low-fat dairy as part of your daily diet.   Lifestyle  Choose healthy options in all settings, including home, work, school, restaurants, or stores.  Prepare your food safely: ? Wash your hands after handling raw meats. ? Keep food preparation surfaces clean by regularly washing with hot, soapy water. ? Keep raw meats separate from ready-to-eat foods, such as fruits and vegetables. ? Cook seafood, meat, poultry, and eggs to the recommended internal temperature. ? Store foods at safe temperatures. In general:  Keep cold foods at 7F (4.4C) or below.  Keep hot foods at 17F (60C) or above.  Keep your freezer at [facility] (-17.8C) or below.  Foods are no longer safe to eat when they have been between the temperatures of 40-17F (4.4-60C) for more than 2 hours. What foods should I eat? Fruits Aim to eat 2 cup-equivalents of fresh, canned (in natural juice), or frozen fruits each day. Examples of 1 cup-equivalent of fruit include 1 small apple, 8 large strawberries, 1 cup canned fruit,  cup dried fruit, or 1 cup 100% juice. Vegetables Aim to eat 2-3 cup-equivalents of fresh and frozen vegetables each day, including different varieties and colors. Examples of 1 cup-equivalent of vegetables include 2 medium carrots, 2 cups raw, leafy greens, 1 cup chopped  vegetable (raw or cooked), or 1 medium baked potato. Grains Aim to eat 6 ounce-equivalents of whole grains each day. Examples of 1 ounce-equivalent of grains include 1 slice of bread, 1 cup ready-to-eat cereal, 3 cups popcorn, or  cup cooked rice, pasta, or cereal. Meats and other proteins Aim to eat 5-6 ounce-equivalents of protein each day. Examples of 1 ounce-equivalent of protein include 1 egg, 1/2 cup nuts or seeds, or 1 tablespoon (16 g) peanut butter. A cut of meat or fish that is the size of a deck of cards is about 3-4 ounce-equivalents.  Of the protein you eat each week, try to have at least 8 ounces come from seafood. This includes salmon, trout, herring, and anchovies. Dairy Aim to eat 3 cup-equivalents of fat-free or low-fat dairy each day. Examples of 1 cup-equivalent of dairy include 1 cup (240 mL) milk, 8 ounces (250 g) yogurt, 1 ounces (44 g) natural cheese, or 1 cup (240 mL) fortified soy milk. Fats and oils  Aim for about 5 teaspoons (21 g) per day. Choose monounsaturated fats, such as canola and olive oils, avocados, peanut butter, and most nuts, or polyunsaturated fats, such as sunflower, corn, and soybean oils, walnuts, pine nuts, sesame seeds, sunflower seeds, and flaxseed. Beverages  Aim for six 8-oz glasses of water per day. Limit coffee to three to five 8-oz cups per day.  Limit caffeinated beverages that have added calories, such as soda and energy drinks.  Limit alcohol intake to no more than 1 drink a day for nonpregnant women and 2 drinks a day for men. One drink equals 12 oz of beer (355 mL), 5 oz of wine (148 mL), or 1 oz of hard liquor (44 mL). Seasoning and other foods  Avoid adding excess amounts of salt to your foods. Try flavoring foods with herbs and spices instead of salt.  Avoid adding sugar to foods.  Try using oil-based dressings, sauces, and spreads instead of solid fats. This information is based on general [place] nutrition guidelines. For more  information, visit choosemyplate.gov. Exact amounts may vary based on your nutrition needs. Summary  A healthy eating plan may help you to maintain a healthy weight, reduce the risk of chronic diseases, and stay active throughout your life.  Plan your meals. Make sure you eat the right portions of a variety of nutrient-rich foods.  Try baking, boiling, grilling, or broiling instead of frying.  Choose healthy options in all settings, including home, work, school, restaurants, or stores. This information is not intended to replace advice given to you by your health care provider. Make sure you discuss any questions you have with your health care provider. Document Revised: 02/08/2018 Document Reviewed: 02/08/2018 Elsevier Patient Education  2021 [employer] Inc.  

## 2020-12-18 NOTE — Assessment & Plan Note (Signed)
Continue to collaborate with hematology, recent note reviewed.  She denies bleeding or increased bruising.  ?

## 2020-12-18 NOTE — Assessment & Plan Note (Signed)
Chronic, ongoing with BP well below goal today. Continue current medication regimen and adjust as needed.  Recommend she check BP at home at least 3 mornings a week and document.  DASH diet focus.  If ongoing lower reading next visit, consider discontinuation or reduction to 2.5 MG of Lisinopril.  Return in 3 months.

## 2020-12-18 NOTE — Assessment & Plan Note (Signed)
Noted on CT imaging 08/07/20.  Continue statin for prevention.  Recommend continued cessation of smoking.  No ASA due to ITP. ?

## 2020-12-18 NOTE — Progress Notes (Signed)
BP 116/79   Pulse 69   Temp 98.1 F (36.7 C) (Oral)   Ht 4' 10.19" (1.478 m)   Wt 119 lb 12.8 oz (54.3 kg)   LMP  (LMP Unknown)   SpO2 99%   BMI 24.88 kg/m    Subjective:    Patient ID: Denise Everett, female    DOB: [birth year], 70 y.o.   MRN: [id number]  HPI: Denise Everett is a 70 y.o. female  Chief Complaint  Patient presents with  . Weight Check    WEIGHT LOSS: Reports feeling better now that started Mirtazapine and had a lengthy discussion with her family about concerns she had with her medication, concerns they were manipulating her medications.  She continues to report not wanting adult protective services out to home, discussed this with her today -- states her niece is not helping her anymore.  Continues to maintain weight, has lost 4 pounds since last visit, but maintaining -- her niece took her Mirtazapine and needs refills.Currently eating two meals a day, more than previous, does eat snacks in between.Takes occasional TUMS for heart burn. Has not had colonoscopy, reports the snow came in and did not go. She saw GI on 05/01/20. Have been working with CCM team on assistance with this, but patient does endorse her phone does not always work.  She denies night sweats, fever, decreased appetite at this time.  Denies SOB or CP. History of smoking, started at 13 and quit 4 years ago. Had initial lung CT screening in 2018 which showed emphysema & aortic atherosclerosis and was to repeat in 12 months, missed screening-- she was to go for abdominal and lung CT imaging which PCP ordered, but has not attended yet -- reports "I keep forgetting". IFOBT negative 02/16/20. Followed by Dr. Grayland Ormond for MGUS and ITP, did see for follow-up on 08/31/20 and is to return in 3 months -- last CBC inOctober PLT 103.  Fever: no Nausea: no Vomiting: none Weight loss: yes Decreased appetite: yes Diarrhea: no Constipation: no Blood in stool: no Heartburn: yes Jaundice: no Rash:  no Dysuria/urinary frequency: no Hematuria: no History of sexually transmitted disease: no Recurrent NSAID use: no  Relevant past medical, surgical, family and social history reviewed and updated as indicated. Interim medical history since our last visit reviewed. Allergies and medications reviewed and updated.  Review of Systems  Constitutional: Negative for activity change, appetite change (improved), diaphoresis, fatigue and fever.  Respiratory: Negative for cough, chest tightness, shortness of breath and wheezing.   Cardiovascular: Negative for chest pain, palpitations and leg swelling.  Gastrointestinal: Negative for abdominal distention, abdominal pain, blood in stool, constipation, diarrhea, nausea and vomiting.  Neurological: Negative.   Psychiatric/Behavioral: Negative for decreased concentration, self-injury, sleep disturbance and suicidal ideas. The patient is not nervous/anxious.    Per HPI unless specifically indicated above     Objective:    BP 116/79   Pulse 69   Temp 98.1 F (36.7 C) (Oral)   Ht 4' 10.19" (1.478 m)   Wt 119 lb 12.8 oz (54.3 kg)   LMP  (LMP Unknown)   SpO2 99%   BMI 24.88 kg/m   Wt Readings from Last 3 Encounters:  12/18/20 119 lb 12.8 oz (54.3 kg)  11/19/20 123 lb (55.8 kg)  11/06/20 123 lb 3.2 oz (55.9 kg)    Physical Exam Vitals and nursing note reviewed.  Constitutional:      General: She is awake. She is not in acute distress.  Appearance: She is well-developed and well-groomed. She is not ill-appearing.  HENT:     Head: Normocephalic.     Right Ear: Hearing normal.     Left Ear: Hearing normal.     Nose: Nose normal.     Mouth/Throat:     Mouth: Mucous membranes are moist.  Eyes:     General: Lids are normal.        Right eye: No discharge.        Left eye: No discharge.     Conjunctiva/sclera: Conjunctivae normal.     Pupils: Pupils are equal, round, and reactive to light.  Neck:     Thyroid: No thyromegaly.      Vascular: No carotid bruit.  Cardiovascular:     Rate and Rhythm: Normal rate and regular rhythm.     Heart sounds: Normal heart sounds. No murmur heard. No gallop.   Pulmonary:     Effort: Pulmonary effort is normal. No accessory muscle usage or respiratory distress.     Breath sounds: Normal breath sounds.  Abdominal:     General: Bowel sounds are normal. There is no distension or abdominal bruit.     Palpations: Abdomen is soft. There is no hepatomegaly or splenomegaly.     Tenderness: There is no abdominal tenderness.     Hernia: No hernia is present.  Musculoskeletal:     Cervical back: Normal range of motion and neck supple.     Right lower leg: No edema.     Left lower leg: No edema.  Lymphadenopathy:     Head:     Right side of head: No submental, submandibular, tonsillar, preauricular or posterior auricular adenopathy.     Left side of head: No submental, submandibular, tonsillar, preauricular or posterior auricular adenopathy.     Cervical: No cervical adenopathy.  Skin:    General: Skin is warm and dry.  Neurological:     Mental Status: She is alert and oriented to person, place, and time.  Psychiatric:        Attention and Perception: Attention normal.        Mood and Affect: Mood normal.        Speech: Speech normal.        Behavior: Behavior normal. Behavior is cooperative.    Results for orders placed or performed in visit on 11/06/20  Urine Culture   Specimen: Urine   UR  Result Value Ref Range   Urine Culture, Routine Final report    Organism ID, Bacteria Comment   Microscopic Examination   Urine  Result Value Ref Range   WBC, UA 0-5 0 - 5 /hpf   RBC 0-2 0 - 2 /hpf   Epithelial Cells (non renal) 0-10 0 - 10 /hpf   Bacteria, UA Few None seen/Few  TSH  Result Value Ref Range   TSH 0.963 0.450 - 4.500 uIU/mL  Comprehensive metabolic panel  Result Value Ref Range   Glucose 93 65 - 99 mg/dL   BUN 26 8 - 27 mg/dL   Creatinine, Ser 2.32 (H) 0.57 - 1.00  mg/dL   GFR calc non Af Amer 21 (L) >59 mL/min/1.73   GFR calc Af Amer 24 (L) >59 mL/min/1.73   BUN/Creatinine Ratio 11 (L) 12 - 28   Sodium 138 134 - 144 mmol/L   Potassium 4.3 3.5 - 5.2 mmol/L   Chloride 109 (H) 96 - 106 mmol/L   CO2 15 (L) 20 - 29 mmol/L   Calcium 8.3 (  L) 8.7 - 10.3 mg/dL   Total Protein 6.1 6.0 - 8.5 g/dL   Albumin 3.7 3.7 - 4.7 g/dL   Globulin, Total 2.4 1.5 - 4.5 g/dL   Albumin/Globulin Ratio 1.5 1.2 - 2.2   Bilirubin Total <0.2 0.0 - 1.2 mg/dL   Alkaline Phosphatase 51 44 - 121 IU/L   AST 9 0 - 40 IU/L   ALT 3 0 - 32 IU/L  Lipid Panel w/o Chol/HDL Ratio  Result Value Ref Range   Cholesterol, Total 147 100 - 199 mg/dL   Triglycerides 76 0 - 149 mg/dL   HDL 40 >39 mg/dL   VLDL Cholesterol Cal 15 5 - 40 mg/dL   LDL Chol Calc (NIH) 92 0 - 99 mg/dL  Urinalysis, Routine w reflex microscopic  Result Value Ref Range   Specific Gravity, UA 1.010 1.005 - 1.030   pH, UA 5.0 5.0 - 7.5   Color, UA Yellow Yellow   Appearance Ur Clear Clear   Leukocytes,UA Negative Negative   Protein,UA Negative Negative/Trace   Glucose, UA Negative Negative   Ketones, UA Negative Negative   RBC, UA 2+ (A) Negative   Bilirubin, UA Negative Negative   Urobilinogen, Ur 0.2 0.2 - 1.0 mg/dL   Nitrite, UA Negative Negative   Microscopic Examination See below:       Assessment & Plan:   Problem List Items Addressed This Visit      Cardiovascular and Mediastinum   Hypertension    Chronic, ongoing with BP well below goal today. Continue current medication regimen and adjust as needed.  Recommend she check BP at home at least 3 mornings a week and document.  DASH diet focus.  If ongoing lower reading next visit, consider discontinuation or reduction to 2.5 MG of Lisinopril.  Return in 3 months.      Relevant Medications   lisinopril (ZESTRIL) 10 MG tablet   Aortic atherosclerosis ([place])    Noted on CT imaging 08/07/20.  Continue statin for prevention.  Recommend continued cessation  of smoking.  No ASA due to ITP.      Relevant Medications   lisinopril (ZESTRIL) 10 MG tablet     Respiratory   Centrilobular emphysema ([place])    Noted on CT 02/03/2017.  Is a former smoker, reports quitting 4 years ago.  No current inhalers.  Would benefit from spirometry in future, refuses today.  Has repeat lung CT ordered, but has not attended or scheduled this yet -- highly recommend she scheduled this as soon as possible.  Will continue to work closely with CCM team on assist.        Genitourinary   CKD (chronic kidney disease) stage 3, GFR 30-59 ml/min (HCC)    Chronic, ongoing.  Continue current medication regimen and collaboration with nephrology.  CCM collaboration continues.  Return in 3 months.        Hematopoietic and Hemostatic   Idiopathic thrombocytopenic purpura ([place]) (Chronic)    Continue to collaborate with hematology, recent note reviewed.  She denies bleeding or increased bruising.        Other   Schizophrenia ([place]) - Primary    Chronic, ongoing.  Continue current medication regimen, Risperdal, and adjust as needed.  Has been on this regimen for years with benefit.  Does have underlying paranoia at baseline.      Abnormal weight loss    Weight stable with mild loss this check and appetite reported to be improving -- ?related to her mood and schizophrenia --  does have some paranoid thought processes at baseline.  She denies any symptoms at this time, no B symptoms.  Has diagnosis of underlying MGUS and ITP, followed by oncology.  Continue collaboration with GI due to weight loss and history of smoking no previous colonoscopy -- had negative iFOBT.  Have recommended she obtain colonoscopy, has not obtained as of yet -- new referral placed for this.  Highly recommend she still obtain lung and abdominal CT, even though some improvement present.  Will continue to work with CCM team on assistance needs, patient is very difficult to get in touch with and does not always  follow direction -- appears to have low support per patient report.  She has concerns about her family not providing her medications correctly, but also refuses APS assessment -- will discuss further with CCM team. Return in 3 months.       Other Visit Diagnoses    Colon cancer screening       New GI referral for this per patient request.   Relevant Orders   Ambulatory referral to Gastroenterology       Follow up plan: Return in about 3 months (around 03/17/2021) for HTN/HLD, COPD, WEIGHT CHECK, CKD, ITP.

## 2020-12-18 NOTE — Assessment & Plan Note (Signed)
Weight stable with mild loss this check and appetite reported to be improving -- ?related to her mood and schizophrenia -- does have some paranoid thought processes at baseline.  She denies any symptoms at this time, no B symptoms.  Has diagnosis of underlying MGUS and ITP, followed by oncology.  Continue collaboration with GI due to weight loss and history of smoking no previous colonoscopy -- had negative iFOBT.  Have recommended she obtain colonoscopy, has not obtained as of yet -- new referral placed for this.  Highly recommend she still obtain lung and abdominal CT, even though some improvement present.  Will continue to work with CCM team on assistance needs, patient is very difficult to get in touch with and does not always follow direction -- appears to have low support per patient report.  She has concerns about her family not providing her medications correctly, but also refuses APS assessment -- will discuss further with CCM team. Return in 3 months.

## 2020-12-18 NOTE — Assessment & Plan Note (Signed)
Noted on CT 02/03/2017.  Is a former smoker, reports quitting 4 years ago.  No current inhalers.  Would benefit from spirometry in future, refuses today.  Has repeat lung CT ordered, but has not attended or scheduled this yet -- highly recommend she scheduled this as soon as possible.  Will continue to work closely with CCM team on assist.

## 2020-12-25 ENCOUNTER — Other Ambulatory Visit: Payer: Self-pay | Admitting: Nurse Practitioner

## 2020-12-25 DIAGNOSIS — E872 Acidosis: Secondary | ICD-10-CM | POA: Diagnosis not present

## 2020-12-25 DIAGNOSIS — I10 Essential (primary) hypertension: Secondary | ICD-10-CM | POA: Diagnosis not present

## 2020-12-25 DIAGNOSIS — D631 Anemia in chronic kidney disease: Secondary | ICD-10-CM | POA: Diagnosis not present

## 2020-12-25 DIAGNOSIS — N1832 Chronic kidney disease, stage 3b: Secondary | ICD-10-CM | POA: Diagnosis not present

## 2020-12-25 NOTE — Telephone Encounter (Signed)
Requested medication (s) are due for refill today: yes  Requested medication (s) are on the active medication list: yes  Last refill:  11/28/2020  Future visit scheduled: Yes  Notes to clinic:  this refill cannot be delegated    Requested Prescriptions  Pending Prescriptions Disp Refills   risperiDONE (RISPERDAL) 2 MG tablet [Pharmacy Med Name: RISPERIDONE 2 MG TAB] 30 tablet 5    Sig: TAKE 1/2 TO 1 TABLET BY MOUTH AT BEDTIME      Not Delegated - Psychiatry:  Antipsychotics - Second Generation (Atypical) - risperidone Failed - 12/25/2020  1:23 PM      Failed - This refill cannot be delegated      Failed - Prolactin Level (serum) in normal range and within 180 days    No results found for: PROLACTIN, TOTPROLACTIN, LABPROL        Passed - ALT in normal range and within 180 days    ALT  Date Value Ref Range Status  11/06/2020 3 0 - 32 IU/L Final          Passed - AST in normal range and within 180 days    AST  Date Value Ref Range Status  11/06/2020 9 0 - 40 IU/L Final          Passed - Valid encounter within last 6 months    Recent Outpatient Visits           1 week ago Schizophrenia, unspecified type ([place])   [place], Jolene T, NP   1 month ago Abnormal weight loss   [employer], Sherrill T, NP   4 months ago Idiopathic thrombocytopenic purpura ([place])   [place], Nome T, NP   4 months ago Acute right-sided low back pain without sciatica   [facility] Eulogio Bear, NP   5 months ago Schizophrenia, unspecified type ([place])   [place], Barbaraann Faster, NP       Future Appointments             Tomorrow  [place]   In 2 months [place], Barbaraann Faster, NP [facility]   In 50 months  [facility]

## 2020-12-26 ENCOUNTER — Other Ambulatory Visit: Payer: Self-pay

## 2020-12-26 ENCOUNTER — Telehealth: Payer: Self-pay | Admitting: General Practice

## 2020-12-26 ENCOUNTER — Ambulatory Visit (INDEPENDENT_AMBULATORY_CARE_PROVIDER_SITE_OTHER): Payer: Medicare Other | Admitting: General Practice

## 2020-12-26 ENCOUNTER — Telehealth (INDEPENDENT_AMBULATORY_CARE_PROVIDER_SITE_OTHER): Payer: Self-pay | Admitting: Gastroenterology

## 2020-12-26 DIAGNOSIS — Z1211 Encounter for screening for malignant neoplasm of colon: Secondary | ICD-10-CM

## 2020-12-26 DIAGNOSIS — N1832 Chronic kidney disease, stage 3b: Secondary | ICD-10-CM

## 2020-12-26 DIAGNOSIS — I10 Essential (primary) hypertension: Secondary | ICD-10-CM

## 2020-12-26 MED ORDER — NA SULFATE-K SULFATE-MG SULF 17.5-3.13-1.6 GM/177ML PO SOLN: 1.0000 | Freq: Once | ORAL | 0 refills | Status: AC
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
## 2020-12-26 NOTE — Chronic Care Management (AMB) (Signed)
Chronic Care Management   CCM RN Visit Note  12/26/2020 Name: Denise Everett MRN: [id number] DOB: 1951/10/27  Subjective: Denise Everett is a 70 y.o. year old female who is a primary care patient of Cannady, Barbaraann Faster, NP. The care management team was consulted for assistance with disease management and care coordination needs.    Engaged with patient by telephone for follow up visit in response to provider referral for case management and/or care coordination services.   Consent to Services:  The patient was given information about Chronic Care Management services, agreed to services, and gave verbal consent prior to initiation of services.  Please see initial visit note for detailed documentation.   Patient agreed to services and verbal consent obtained.   Assessment: Review of patient past medical history, allergies, medications, health status, including review of consultants reports, laboratory and other test data, was performed as part of comprehensive evaluation and provision of chronic care management services.   SDOH (Social Determinants of Health) assessments and interventions performed:    CCM Care Plan  Allergies  Allergen Reactions   No Known Allergies     Outpatient Encounter Medications as of 12/26/2020  Medication Sig   Aspirin-Acetaminophen-Caffeine 144-315-40 MG PACK Take by mouth.   atorvastatin (LIPITOR) 10 MG tablet TAKE 1 TABLET BY MOUTH DAILY AT 6 PM   diclofenac Sodium (VOLTAREN) 1 % GEL Apply 4 g topically 4 (four) times daily.   famotidine (PEPCID) 20 MG tablet Take 1 tablet (20 mg total) by mouth daily.   gabapentin (NEURONTIN) 300 MG capsule Take 1 capsule (300 mg total) by mouth 2 (two) times daily.   lisinopril (ZESTRIL) 10 MG tablet Take by mouth.   lisinopril (ZESTRIL) 5 MG tablet Take 1 tablet (5 mg total) by mouth daily.   mirtazapine (REMERON) 15 MG tablet Take 0.5 tablets (7.5 mg total) by mouth at bedtime.   Na Sulfate-K Sulfate-Mg  Sulf 17.5-3.13-1.6 GM/177ML SOLN At 5 PM the day before procedure take 1 bottle and 5 hours before procedure take 1 bottle.   Na Sulfate-K Sulfate-Mg Sulf 17.5-3.13-1.6 GM/177ML SOLN Take 1 kit by mouth once for 1 dose.   ondansetron (ZOFRAN ODT) 4 MG disintegrating tablet Take 1 tablet (4 mg total) by mouth every 8 (eight) hours as needed for nausea or vomiting.   raloxifene (EVISTA) 60 MG tablet TAKE 1 TABLET BY MOUTH EVERY DAY   risperiDONE (RISPERDAL) 2 MG tablet TAKE 1/2 TO 1 TABLET BY MOUTH AT BEDTIME   sodium bicarbonate 650 MG tablet Take by mouth.   tiZANidine (ZANAFLEX) 4 MG tablet TAKE 1 TABLET BY MOUTH EVERY 6 HOURS AS NEEDED FOR MUSCLE SPASMS.   No facility-administered encounter medications on file as of 12/26/2020.    Patient Active Problem List   Diagnosis Date Noted   Aortic atherosclerosis ([place]) 10/20/2020   Centrilobular emphysema ([place]) 10/20/2020   Abnormal weight loss 03/28/2020   CKD (chronic kidney disease) stage 3, GFR 30-59 ml/min (HCC) 02/29/2020   Osteoarthritis of lumbar spine 04/15/2017   Lumbar facet arthropathy 04/15/2017   Lumbar facet syndrome (Bilateral) (R>L) 04/15/2017   Vitamin D insufficiency 03/18/2017   Marijuana use 03/03/2017   Chronic pain syndrome 02/24/2017   Chronic ankle pain  (Location of Tertiary source of pain) (Bilateral) (R>L) 02/24/2017   Abnormal MRI, lumbar spine (06/22/2014) 02/24/2017   Lumbar central spinal stenosis (Severe at L3-4) 02/24/2017   Lumbar foraminal stenosis (Severe: Left L3-4; Right L4-5; Bilateral L5-S1) 02/24/2017   Lumbar lateral recess  stenosis (Severe: Left L3-4; Right L4-5; Left L5-S1) 02/24/2017   Personal history of tobacco use, presenting hazards to health 02/03/2017   Advance care planning 10/08/2016   MGUS (monoclonal gammopathy of unknown significance) 06/23/2016   Schizophrenia ([place]) 06/07/2015   Osteoporosis 06/07/2015   Chronic low back pain (Location of Primary Source of  Pain) (Bilateral) (R>L) 06/07/2015   Idiopathic thrombocytopenic purpura ([place]) 06/07/2015   Allergic rhinitis 06/07/2015   Chronic leg pain 06/07/2015   Hyperlipidemia 06/07/2015   Hypertension 06/07/2015   Lumbar IVDD (intervertebral disc displacement) 09/29/2014   Neuritis or radiculitis due to rupture of lumbar intervertebral disc 09/29/2014   Chronic Lumbar radiculitis 09/29/2014   Lumbar HNP (herniated nucleus pulposus) 09/29/2014    Conditions to be addressed/monitored:HTN, CKD Stage 3 and Colonoscopy scheduled   Care Plan : RNCM: Chronic Kidney (Adult)  Updates made by Vanita Ingles since 12/26/2020 12:00 AM    Problem: RNCM: Adjustment to Chronic Kidney Disease   Priority: Medium    Long-Range Goal: RNCM: CKD   Priority: Medium  Note:   Current Barriers:   Knowledge Deficits related to taking sodium bicarbonate as prescribed by the provider and the need to take medications as prescribed for kidney health   Chronic Disease Management support and education needs related to effective management of CKD3  Lacks caregiver support.   Unable to independently manage CKD  Unable to self administer medications as prescribed  Does not attend all scheduled provider appointments  Does not adhere to prescribed medication regimen  Lacks social connections  Does not contact provider office for questions/concerns  Nurse Case Manager Clinical Goal(s):   patient will verbalize understanding of plan for effective management of CKD3  patient will work with RNCM, pcp, and specialist  to address needs related to CKD  patient will attend all scheduled medical appointments: 03-19-2021   patient will demonstrate improved adherence to prescribed treatment plan for CKD as evidenced bycompliance with medications regimen and working with the CCM team to manage health and well being.   Interventions:   1:1 collaboration with Venita Lick, NP regarding development and  update of comprehensive plan of care as evidenced by provider attestation and co-signature  Inter-disciplinary care team collaboration (see longitudinal plan of care)  Evaluation of current treatment plan related to CKD stage 3 and patient's adherence to plan as established by provider.  Advised patient to call the office for changes or questions   Provided education to patient re: taking medications as prescribed, wt loss, safety  Reviewed medications with patient and discussed compliance. The patient states that she was supposed to be taking Sodium Bicarb but did not get it from pharmacy. The patient has been instructed to take 1300 mg BID. Education provided and the patient states that she is supposed to get the medication from the pharmacy today.   Reviewed scheduled/upcoming provider appointments including: 02-06-2021 at 3:45 pm  Discussed plans with patient for ongoing care management follow up and provided patient with direct contact information for care management team  Patient Goals/Self-Care Activities Over the next 120 days, patient will:  - Patient will self administer medications as prescribed Patient will attend all scheduled provider appointments Patient will call pharmacy for medication refills Patient will attend church or other social activities Patient will continue to perform ADL's independently Patient will continue to perform IADL's independently Patient will call provider office for new concerns or questions Patient will work with BSW to address care coordination needs and will continue to  work with the clinical team to address health care and disease management related needs.   - counseling provided - decision-making supported - depression screen reviewed - family care conference arranged - family involvement promoted - goal setting facilitated - self-care encouraged - self-reflection promoted - verbalization of feelings encouraged  Follow Up Plan: Telephone  follow up appointment with care management team member scheduled for:02-06-2021 at 3:45 pm       Task: RNCM: Support Psychological Response to Chronic Kidney Disease   Note:   Care Management Activities:    - counseling provided - decision-making supported - depression screen reviewed - family care conference arranged - family involvement promoted - goal setting facilitated - self-care encouraged - self-reflection promoted - verbalization of feelings encouraged       Care Plan : RNCM: Hypertension (Adult)  Updates made by Vanita Ingles since 12/26/2020 12:00 AM    Problem: RNCM: Hypertension (Hypertension)   Priority: Medium    Goal: RNCM: Hypertension Monitored   Priority: Medium  Note:   Objective:   Last practice recorded BP readings:  BP Readings from Last 3 Encounters:  12/18/20 116/79  11/06/20 99/63  08/31/20 109/84   Elevated blood pressure at kidney specialist on Tuesday 12-25-2020  Most recent eGFR/CrCl: No results found for: EGFR  No components found for: CRCL Current Barriers:   Knowledge Deficits related to basic understanding of hypertension pathophysiology and self care management  Knowledge Deficits related to understanding of medications prescribed for management of hypertension  Non-adherence to prescribed medication regimen  Non-adherence to scheduled provider appointments  Limited Social Support  Unable to independently manage HTN  Does not attend all scheduled provider appointments  Does not adhere to prescribed medication regimen  Lacks social connections  Does not contact provider office for questions/concerns Case Manager Clinical Goal(s):   Over the next 120 days, patient will verbalize understanding of plan for hypertension management  Over the next 120 days, patient will attend all scheduled medical appointments: 03-19-2021   Over the next 120 days, patient will demonstrate improved adherence to prescribed treatment plan for  hypertension as evidenced by taking all medications as prescribed, monitoring and recording blood pressure as directed, adhering to low sodium/DASH diet  Over the next 120 days, patient will demonstrate improved health management independence as evidenced by checking blood pressure as directed and notifying PCP if SBP>160 or DBP > 90, taking all medications as prescribe, and adhering to a low sodium diet as discussed.  Over the next 120 days, patient will verbalize basic understanding of hypertension disease process and self health management plan as evidenced by taking medications as directed, following a heart health diet, working with the CCM team to manage health and well being.  Interventions:   Collaboration with Venita Lick, NP regarding development and update of comprehensive plan of care as evidenced by provider attestation and co-signature  Inter-disciplinary care team collaboration (see longitudinal plan of care)  Evaluation of current treatment plan related to hypertension self management and patient's adherence to plan as established by provider.  Provided education to patient re: stroke prevention, s/s of heart attack and stroke, DASH diet, complications of uncontrolled blood pressure  Reviewed medications with patient and discussed importance of compliance  Discussed plans with patient for ongoing care management follow up and provided patient with direct contact information for care management team  Advised patient, providing education and rationale, to monitor blood pressure daily and record, calling PCP for findings outside established parameters.  Reviewed scheduled/upcoming provider appointments including: 03-19-2021 Patient Goals/Self-Care Activities  Over the next 120 days, patient will:  - Self administers medications as prescribed Attends all scheduled provider appointments Calls provider office for new concerns, questions, or BP outside discussed  parameters Checks BP and records as discussed Follows a low sodium diet/DASH diet - depression screen reviewed - home or ambulatory blood pressure monitoring encouraged Follow Up Plan: Telephone follow up appointment with care management team member scheduled for: 02-06-2021 at 3:45 pm   Task: RNCM: Identify and Monitor Blood Pressure Elevation   Note:   Care Management Activities:    - depression screen reviewed - home or ambulatory blood pressure monitoring encouraged         Plan:Telephone follow up appointment with care management team member scheduled for:  02-06-2021 at 3:45 pm  Noreene Larsson RN, MSN, [facility] Mobile: [phone]

## 2020-12-26 NOTE — Progress Notes (Signed)
Gastroenterology Pre-Procedure Review  Request Date: Wednesday 01/23/21 Requesting Physician: Dr. Bonna Gains  PATIENT REVIEW QUESTIONS: The patient responded to the following health history questions as indicated:    1. Are you having any GI issues? no 2. Do you have a personal history of Polyps? no 3. Do you have a family history of Colon Cancer or Polyps? no 4. Diabetes Mellitus? no 5. Joint replacements in the past 12 months?no 6. Major health problems in the past 3 months?no 7. Any artificial heart valves, MVP, or defibrillator?no    MEDICATIONS & ALLERGIES:    Patient reports the following regarding taking any anticoagulation/antiplatelet therapy:   Plavix, Coumadin, Eliquis, Xarelto, Lovenox, Pradaxa, Brilinta, or Effient? no Aspirin? no  Patient confirms/reports the following medications:  Current Outpatient Medications  Medication Sig Dispense Refill  . Na Sulfate-K Sulfate-Mg Sulf 17.5-3.13-1.6 GM/177ML SOLN Take 1 kit by mouth once for 1 dose. 354 mL 0  . sodium bicarbonate 650 MG tablet Take by mouth.    . Aspirin-Acetaminophen-Caffeine 903-833-38 MG PACK Take by mouth.    Marland Kitchen atorvastatin (LIPITOR) 10 MG tablet TAKE 1 TABLET BY MOUTH DAILY AT 6 PM 90 tablet 1  . diclofenac Sodium (VOLTAREN) 1 % GEL Apply 4 g topically 4 (four) times daily. 50 g 1  . famotidine (PEPCID) 20 MG tablet Take 1 tablet (20 mg total) by mouth daily. 90 tablet 3  . gabapentin (NEURONTIN) 300 MG capsule Take 1 capsule (300 mg total) by mouth 2 (two) times daily. 180 capsule 3  . lisinopril (ZESTRIL) 10 MG tablet Take by mouth.    Marland Kitchen lisinopril (ZESTRIL) 5 MG tablet Take 1 tablet (5 mg total) by mouth daily. 90 tablet 3  . mirtazapine (REMERON) 15 MG tablet Take 0.5 tablets (7.5 mg total) by mouth at bedtime. 45 tablet 4  . Na Sulfate-K Sulfate-Mg Sulf 17.5-3.13-1.6 GM/177ML SOLN At 5 PM the day before procedure take 1 bottle and 5 hours before procedure take 1 bottle. (Patient not taking: No sig  reported) 354 mL 0  . ondansetron (ZOFRAN ODT) 4 MG disintegrating tablet Take 1 tablet (4 mg total) by mouth every 8 (eight) hours as needed for nausea or vomiting. 20 tablet 0  . raloxifene (EVISTA) 60 MG tablet TAKE 1 TABLET BY MOUTH EVERY DAY 90 tablet 1  . risperiDONE (RISPERDAL) 2 MG tablet TAKE 1/2 TO 1 TABLET BY MOUTH AT BEDTIME 90 tablet 4  . tiZANidine (ZANAFLEX) 4 MG tablet TAKE 1 TABLET BY MOUTH EVERY 6 HOURS AS NEEDED FOR MUSCLE SPASMS. 60 tablet 2   No current facility-administered medications for this visit.    Patient confirms/reports the following allergies:  Allergies  Allergen Reactions  . No Known Allergies     No orders of the defined types were placed in this encounter.   AUTHORIZATION INFORMATION Primary Insurance: 1D#: Group #:  Secondary Insurance: 1D#: Group #:  SCHEDULE INFORMATION: Date: Wed 01/23/21 Time: Location:[facility]

## 2021-01-11 ENCOUNTER — Inpatient Hospital Stay: Payer: Medicare Other | Attending: Oncology

## 2021-01-11 ENCOUNTER — Other Ambulatory Visit: Payer: Self-pay | Admitting: Nurse Practitioner

## 2021-01-11 NOTE — Telephone Encounter (Signed)
Requested medication (s) are due for refill today - unknown  Requested medication (s) are on the active medication list -yes  Future visit scheduled -yes  Last refill: 10/22/20 #60 2 RF  Notes to clinic: Request RF non delegated Rx  Requested Prescriptions  Pending Prescriptions Disp Refills   tiZANidine (ZANAFLEX) 4 MG tablet [Pharmacy Med Name: TIZANIDINE HCL 4 MG TAB] 60 tablet 2    Sig: TAKE 1 TABLET BY MOUTH EVERY 6 HOURS AS NEEDED FOR MUSCLE SPASMS      Not Delegated - Cardiovascular:  Alpha-2 Agonists - tizanidine Failed - 01/11/2021  9:22 AM      Failed - This refill cannot be delegated      Passed - Valid encounter within last 6 months    Recent Outpatient Visits           3 weeks ago Schizophrenia, unspecified type ([place])   [place], Jolene T, NP   2 months ago Abnormal weight loss   [employer], [place] T, NP   4 months ago Idiopathic thrombocytopenic purpura ([place])   [place] Mc[place]ts, [place] T, NP   5 months ago Acute right-sided low back pain without sciatica   [facility] Eulogio Bear, NP   6 months ago Schizophrenia, unspecified type ([place])   [place], Barbaraann Faster, NP       Future Appointments             In 2 months Cannady, Barbaraann Faster, NP [employer]   In 10 months  [employer]              Refused Prescriptions Disp Refills   famotidine (PEPCID) 20 MG tablet [Pharmacy Med Name: FAMOTIDINE 20 MG TAB] 90 tablet 3    Sig: TAKE 1 TABLET BY MOUTH DAILY      Gastroenterology:  H2 Antagonists Passed - 01/11/2021  9:22 AM      Passed - Valid encounter within last 12 months    Recent Outpatient Visits           3 weeks ago Schizophrenia, unspecified type ([place])   [place], Jolene T, NP   2 months ago Abnormal weight loss   [employer], [place] T, NP   4 months ago Idiopathic  thrombocytopenic purpura ([place])   [place] Lantry, [place] T, NP   5 months ago Acute right-sided low back pain without sciatica   [facility] Eulogio Bear, NP   6 months ago Schizophrenia, unspecified type ([place])   [place], Barbaraann Faster, NP       Future Appointments             In 2 months Cannady, Barbaraann Faster, NP [employer]   In 10 months  [employer] [facility]                 Requested Prescriptions  Pending Prescriptions Disp Refills   tiZANidine (ZANAFLEX) 4 MG tablet [Pharmacy Med Name: TIZANIDINE HCL 4 MG TAB] 60 tablet 2    Sig: TAKE 1 TABLET BY MOUTH EVERY 6 HOURS AS NEEDED FOR MUSCLE SPASMS      Not Delegated - Cardiovascular:  Alpha-2 Agonists - tizanidine Failed - 01/11/2021  9:22 AM      Failed - This refill cannot be delegated      Passed - Valid encounter within last 6 months  Recent Outpatient Visits           3 weeks ago Schizophrenia, unspecified type ([place])   [place], [place] T, NP   2 months ago Abnormal weight loss   [facility] [place], [place] T, NP   4 months ago Idiopathic thrombocytopenic purpura ([place])   [place], [place] T, NP   5 months ago Acute right-sided low back pain without sciatica   [place], NP   6 months ago Schizophrenia, unspecified type ([place])   [place], Barbaraann Faster, NP       Future Appointments             In 2 months Cannady, Barbaraann Faster, NP [employer]   In 10 months  [employer] [facility]              Refused Prescriptions Disp Refills   famotidine (PEPCID) 20 MG tablet [Pharmacy Med Name: FAMOTIDINE 20 MG TAB] 90 tablet 3    Sig: TAKE 1 TABLET BY MOUTH DAILY      Gastroenterology:  H2 Antagonists Passed - 01/11/2021  9:22 AM      Passed - Valid encounter within last 12 months    Recent  Outpatient Visits           3 weeks ago Schizophrenia, unspecified type ([place])   [place], Jolene T, NP   2 months ago Abnormal weight loss   [facility] [place], [place] T, NP   4 months ago Idiopathic thrombocytopenic purpura ([place])   [place], [place] T, NP   5 months ago Acute right-sided low back pain without sciatica   [facility] Eulogio Bear, NP   6 months ago Schizophrenia, unspecified type ([place])   [place], Barbaraann Faster, NP       Future Appointments             In 2 months Cannady, Barbaraann Faster, NP [employer]   In 49 months  [employer]

## 2021-01-14 ENCOUNTER — Other Ambulatory Visit: Payer: Self-pay | Admitting: Nurse Practitioner

## 2021-01-14 NOTE — Telephone Encounter (Signed)
Pharmacy states that patient only has 60 tabs left on last script because when she first filled script in 03/2020 she ha request 30 instead of full script. I will send a script over for the full 90 and they will cancel the 60 tabs.

## 2021-01-17 ENCOUNTER — Other Ambulatory Visit: Payer: Self-pay

## 2021-01-17 DIAGNOSIS — Z1211 Encounter for screening for malignant neoplasm of colon: Secondary | ICD-10-CM

## 2021-01-17 NOTE — Progress Notes (Signed)
This encounter was created in error - please disregard.

## 2021-01-17 NOTE — Progress Notes (Signed)
Patient requested to r/s her colonoscopy to 02/05/21.  Procedure has been rescheduled.  Pt has been advised of COVID test date Friday 03/25.  New instructions printed to mail to her.  Thanks,  [place], [place]

## 2021-01-18 ENCOUNTER — Inpatient Hospital Stay: Payer: Medicare Other | Admitting: Oncology

## 2021-01-18 DIAGNOSIS — D472 Monoclonal gammopathy: Secondary | ICD-10-CM

## 2021-01-24 DIAGNOSIS — Z1231 Encounter for screening mammogram for malignant neoplasm of breast: Secondary | ICD-10-CM | POA: Diagnosis not present

## 2021-02-01 ENCOUNTER — Other Ambulatory Visit: Admission: RE | Admit: 2021-02-01 | Payer: Medicare Other | Source: Ambulatory Visit

## 2021-02-05 ENCOUNTER — Encounter: Admission: RE | Payer: Self-pay | Source: Home / Self Care

## 2021-02-05 ENCOUNTER — Ambulatory Visit: Admission: RE | Admit: 2021-02-05 | Payer: Medicare Other | Source: Home / Self Care | Admitting: Gastroenterology

## 2021-02-05 SURGERY — COLONOSCOPY WITH PROPOFOL
Anesthesia: General

## 2021-02-06 ENCOUNTER — Telehealth: Payer: Self-pay | Admitting: General Practice

## 2021-02-06 ENCOUNTER — Telehealth: Payer: Self-pay

## 2021-02-06 NOTE — Telephone Encounter (Signed)
  Chronic Care Management   Outreach Note  02/06/2021 Name: Denise Everett MRN: [id number] DOB: 10/30/1951  Referred by: Venita Lick, NP Reason for referral : Appointment (RNCM: Follow up outreach for chronic disease management and Care coordination Needs)   An unsuccessful telephone outreach was attempted today. The patient was referred to the case management team for assistance with care management and care coordination.   Follow Up Plan: The care management team will reach out to the patient again over the next 30 days.   Noreene Larsson RN, MSN, [facility] Mobile: [phone]

## 2021-02-15 ENCOUNTER — Telehealth: Payer: Self-pay | Admitting: *Deleted

## 2021-02-15 NOTE — Chronic Care Management (AMB) (Signed)
  Care Management   Note  02/15/2021 Name: ANNALYN BLECHER MRN: [id number] DOB: [birth year]  ELANORE TALCOTT is a 70 y.o. year old female who is a primary care patient of Venita Lick, NP and is actively engaged with the care management team. I reached out to [name] by phone today to assist with re-scheduling a follow up visit with the RN Case Manager  Follow up plan: Unsuccessful telephone outreach attempt made.  The care management team will reach out to the patient again over the next 7 days.  If patient returns call to provider office, please advise to call [place] Lysle Morales at [place] [facility]

## 2021-02-15 NOTE — Telephone Encounter (Signed)
Please call and reschedule RN CM  f/u at [facility]

## 2021-02-20 NOTE — Chronic Care Management (AMB) (Signed)
  Care Management   Note  02/20/2021 Name: Denise Everett MRN: [id number] DOB: 23-Mar-1951  [name] is a 70 y.o. year old female who is a primary care patient of Venita Lick, NP and is actively engaged with the care management team. I reached out to [name] by phone today to assist with re-scheduling a follow up visit with the RN Case Manager  Follow up plan: Second unsuccessful telephone outreach attempt made. A HIPAA compliant phone message was left for the patient providing contact information and requesting a return call.  The care management team will reach out to the patient again over the next 7 days.  If patient returns call to provider office, please advise to call [place] Lysle Morales at [place] [facility]

## 2021-02-26 NOTE — Chronic Care Management (AMB) (Signed)
  Care Management   Note  02/26/2021 Name: [name] MRN: [id number] DOB: 08/19/1951  Denise Everett is a 70 y.o. year old female who is a primary care patient of Venita Lick, NP and is actively engaged with the care management team. I reached out to [name] by phone today to assist with re-scheduling a follow up visit with the RN Case Manager  Follow up plan: A third unsuccessful telephone outreach attempt made. A HIPAA compliant phone message was left for the patient providing contact information and requesting a return call.  Unable to make contact on outreach attempts x 3  PCP Venita Lick ,NP notified via routed documentation in medical record.  We have been unable to make contact with the patient for follow up. The care management team is available to follow up with the patient after provider conversation with the patient regarding recommendation for care management engagement and subsequent re-referral to the care management team.  If patient returns call to provider office, please advise to call [place] Lysle Morales at [place] [facility]

## 2021-03-04 ENCOUNTER — Other Ambulatory Visit: Payer: Self-pay | Admitting: Nurse Practitioner

## 2021-03-16 ENCOUNTER — Other Ambulatory Visit: Payer: Self-pay | Admitting: Nurse Practitioner

## 2021-03-16 NOTE — Telephone Encounter (Signed)
Requested medication (s) are due for refill today: yes  Requested medication (s) are on the active medication list: yes  Last refill:  01/11/21  Future visit scheduled: yes  Notes to clinic:  Med not delegated to NT to RF   Requested Prescriptions  Pending Prescriptions Disp Refills   tiZANidine (ZANAFLEX) 4 MG tablet [Pharmacy Med Name: TIZANIDINE HCL 4 MG TAB] 60 tablet 2    Sig: TAKE 1 TABLET BY MOUTH EVERY 6 HOURS AS NEEDED FOR MUSCLE SPASMS      Not Delegated - Cardiovascular:  Alpha-2 Agonists - tizanidine Failed - 03/16/2021  9:16 AM      Failed - This refill cannot be delegated      Passed - Valid encounter within last 6 months    Recent Outpatient Visits           2 months ago Schizophrenia, unspecified type ([place])   [place], Jolene T, NP   4 months ago Abnormal weight loss   [employer], Hudson T, NP   7 months ago Idiopathic thrombocytopenic purpura ([place])   [place], Edgemere T, NP   7 months ago Acute right-sided low back pain without sciatica   [facility] Eulogio Bear, NP   8 months ago Schizophrenia, unspecified type ([place])   [place], Barbaraann Faster, NP       Future Appointments             In 1 week [place], Barbaraann Faster, NP [facility] [facility]   In 8 months  [facility]

## 2021-03-18 NOTE — Telephone Encounter (Signed)
Scheduled 5/16

## 2021-03-19 ENCOUNTER — Ambulatory Visit: Payer: Medicare Other | Admitting: Nurse Practitioner

## 2021-03-25 ENCOUNTER — Ambulatory Visit (INDEPENDENT_AMBULATORY_CARE_PROVIDER_SITE_OTHER): Payer: Medicare Other | Admitting: Nurse Practitioner

## 2021-03-25 ENCOUNTER — Other Ambulatory Visit: Payer: Self-pay

## 2021-03-25 ENCOUNTER — Encounter: Payer: Self-pay | Admitting: Nurse Practitioner

## 2021-03-25 VITALS — BP 128/82 | HR 65 | Temp 98.7°F | Wt 125.6 lb

## 2021-03-25 DIAGNOSIS — D472 Monoclonal gammopathy: Secondary | ICD-10-CM | POA: Diagnosis not present

## 2021-03-25 DIAGNOSIS — D693 Immune thrombocytopenic purpura: Secondary | ICD-10-CM

## 2021-03-25 DIAGNOSIS — M816 Localized osteoporosis [Lequesne]: Secondary | ICD-10-CM

## 2021-03-25 DIAGNOSIS — I700 Atherosclerosis of aorta: Secondary | ICD-10-CM | POA: Diagnosis not present

## 2021-03-25 DIAGNOSIS — R319 Hematuria, unspecified: Secondary | ICD-10-CM | POA: Insufficient documentation

## 2021-03-25 DIAGNOSIS — N1832 Chronic kidney disease, stage 3b: Secondary | ICD-10-CM

## 2021-03-25 DIAGNOSIS — R634 Abnormal weight loss: Secondary | ICD-10-CM

## 2021-03-25 DIAGNOSIS — F209 Schizophrenia, unspecified: Secondary | ICD-10-CM

## 2021-03-25 DIAGNOSIS — R3121 Asymptomatic microscopic hematuria: Secondary | ICD-10-CM

## 2021-03-25 DIAGNOSIS — Z1211 Encounter for screening for malignant neoplasm of colon: Secondary | ICD-10-CM

## 2021-03-25 DIAGNOSIS — J432 Centrilobular emphysema: Secondary | ICD-10-CM

## 2021-03-25 DIAGNOSIS — G8929 Other chronic pain: Secondary | ICD-10-CM

## 2021-03-25 DIAGNOSIS — M5442 Lumbago with sciatica, left side: Secondary | ICD-10-CM

## 2021-03-25 DIAGNOSIS — I10 Essential (primary) hypertension: Secondary | ICD-10-CM | POA: Diagnosis not present

## 2021-03-25 DIAGNOSIS — E559 Vitamin D deficiency, unspecified: Secondary | ICD-10-CM | POA: Diagnosis not present

## 2021-03-25 DIAGNOSIS — M5441 Lumbago with sciatica, right side: Secondary | ICD-10-CM

## 2021-03-25 DIAGNOSIS — Z87891 Personal history of nicotine dependence: Secondary | ICD-10-CM

## 2021-03-25 DIAGNOSIS — E7800 Pure hypercholesterolemia, unspecified: Secondary | ICD-10-CM

## 2021-03-25 MED ORDER — RISPERIDONE 2 MG PO TABS: 1.0000 mg | ORAL_TABLET | Freq: Every day | ORAL | 4 refills | Status: DC

## 2021-03-25 MED ORDER — RALOXIFENE HCL 60 MG PO TABS: 60.0000 mg | ORAL_TABLET | Freq: Every day | ORAL | 4 refills | Status: DC

## 2021-03-25 MED ORDER — ATORVASTATIN CALCIUM 10 MG PO TABS: ORAL_TABLET | ORAL | 4 refills | Status: DC

## 2021-03-25 MED ORDER — MIRTAZAPINE 15 MG PO TABS: 7.5000 mg | ORAL_TABLET | Freq: Every day | ORAL | 4 refills | Status: DC

## 2021-03-25 MED ORDER — TIZANIDINE HCL 4 MG PO TABS: 4.0000 mg | ORAL_TABLET | Freq: Four times a day (QID) | ORAL | 4 refills | Status: DC | PRN

## 2021-03-25 MED ORDER — FAMOTIDINE 20 MG PO TABS: 20.0000 mg | ORAL_TABLET | Freq: Every day | ORAL | 4 refills | Status: DC

## 2021-03-25 MED ORDER — GABAPENTIN 300 MG PO CAPS: 300.0000 mg | ORAL_CAPSULE | Freq: Two times a day (BID) | ORAL | 4 refills | Status: DC

## 2021-03-25 MED ORDER — LISINOPRIL 5 MG PO TABS: 5.0000 mg | ORAL_TABLET | Freq: Every day | ORAL | 4 refills | Status: DC

## 2021-03-25 NOTE — Assessment & Plan Note (Signed)
Lung CT order placed due to weight loss and appetite changes, discussed at length with patient need to obtain this, even though improving.  Recommend continued cessation of smoking.

## 2021-03-25 NOTE — Assessment & Plan Note (Signed)
Noted on CT imaging 08/07/20.  Continue statin for prevention.  Recommend continued cessation of smoking.  No ASA due to ITP. ?

## 2021-03-25 NOTE — Progress Notes (Signed)
BP 128/82 (BP Location: Left Arm)   Pulse 65   Temp 98.7 F (37.1 C) (Oral)   Wt 125 lb 9.6 oz (57 kg)   LMP  (LMP Unknown)   SpO2 99%   BMI 26.08 kg/m    Subjective:    Patient ID: [name], female    DOB: 12-Sep-1951, 70 y.o.   MRN: [id number]  HPI: MARVINA DANNER is a 70 y.o. female  Chief Complaint  Patient presents with  . Hyperlipidemia  . Hypertension  . COPD  . Chronic Kidney Disease  . Weight Check  . Back Pain    Patient states she is having pain in her lower back pain and thinks it may be swollen and requesting penicillins. Patient states she has been experiencing pain, but is requesting medication to help with the pain.   . Medication Refill    Patient is requesting a refill on medication.    SCHIZOPHRENIA/MGUS/THROMBOCYTOPENIA/WEIGHT CHECK Continues on Mirtazapine and Risperdal for mood. Continues to maintain weight, has gained 6 pounds since last visit, maintaining.Currently eating two meals a day, more than previous, does eat snacks in between.Takes Pepcid daily for GERD.  Has not had colonoscopy, she was scheduled 02/05/21 but missed this as reported driver did not get her -- have attempted along with CCM team to get her in multiple times and she has missed. She saw GI on 05/01/20 last. Have been working with CCM team on assistance with this.  She denies night sweats, fever, decreased appetite at this time.  Denies SOB or CP. History of smoking, started at 13 and quit 4 years ago. Had initial lung CT screening in 2018 which showed emphysema & aortic atherosclerosis and was to repeat in 12 months, missed screening-- she was to go for abdominal and lung CT imaging which PCP ordered, but has not attended yet and keeps "forgetting". IFOBT negative 02/16/20. Followed by Dr. Grayland Ormond for MGUS and ITP, did see for follow-up on 01/18/21 and is to return in 6 months -- last CBC inOctober PLT 103 -- was to have labs 01/17/21, but appears to have missed these.   Fever: no Nausea: no Vomiting: none Weight loss: yes Decreased appetite: yes Diarrhea: no Constipation: no Blood in stool: no Heartburn: yes Jaundice: no Rash: no Dysuria/urinary frequency: no Hematuria: no History of sexually transmitted disease: no Recurrent NSAID use: no  Depression screen [facility] 2/9 03/25/2021 12/18/2020 11/19/2020 11/06/2020 07/06/2020  Decreased Interest 3 0 0 2 0  Down, Depressed, Hopeless 0 0 0 0 0  PHQ - 2 Score 3 0 0 2 0  Altered sleeping 0 0 2 0 0  Tired, decreased energy 0 0 1 0 0  Change in appetite 2 0 3 2 0  Feeling bad or failure about yourself  0 0 0 2 0  Trouble concentrating 0 0 0 0 0  Moving slowly or fidgety/restless 3 0 0 2 0  Suicidal thoughts 0 - - - 0  PHQ-9 Score 8 0 6 8 0  Difficult doing work/chores Very difficult - - - -  Some recent data might be hidden    HYPERTENSION / HYPERLIPIDEMIA Continues on Lisinopril 5 MG daily and Atorvastatin 10 MG daily.  Taking Evista for Osteoporosis and no recent DEXA noted in Epic, patient reports no transportation to attend this. Satisfied with current treatment? yes Duration of hypertension: chronic BP monitoring frequency: not checking BP range:  BP medication side effects: no Duration of hyperlipidemia: chronic Cholesterol medication side  effects: no Cholesterol supplements: none Medication compliance: fair compliance Aspirin: no Recent stressors: no Recurrent headaches: no Visual changes: no Palpitations: no Dyspnea: no Chest pain: no Lower extremity edema: no Dizzy/lightheaded: no   CHRONIC KIDNEY DISEASE Last saw nephrology on 12/25/20 -- CRT 1.69, eGFR 35, and BUN 15 at visit. CKD status: stable Medications renally dose: yes Previous renal evaluation: yes Pneumovax:  Up to Date Influenza Vaccine:  Up to Date   COPD Past smoker -- last lung screening in 2018 noted mild centrilobular emphysema.  No current inhalers. COPD status: stable Satisfied with current treatment?:  yes Oxygen use: no Dyspnea frequency: none Cough frequency: none Rescue inhaler frequency:  none Limitation of activity: no Productive cough: none Last Spirometry: unknown --refuses today Pneumovax: Up to Date Influenza: Up to Date   BACK PAIN Seen by pain clinic in past for chronic low back pain -- last 2015 to 2016. Currently taking Tizanidine as needed.   Duration: chronic Mechanism of injury: unknown Location: midline and low back Onset: gradual Severity: 7/10 Quality: dull, aching and throbbing Frequency: intermittent Radiation: none Aggravating factors: lifting, movement and bending Alleviating factors: Voltaren, Gabapentin and Tizanidine Status: fluctuating Treatments attempted: as above Relief with NSAIDs?: no Nighttime pain:  no Paresthesias / decreased sensation:  no Bowel / bladder incontinence:  no Fevers:  no Dysuria / urinary frequency:  no  Relevant past medical, surgical, family and social history reviewed and updated as indicated. Interim medical history since our last visit reviewed. Allergies and medications reviewed and updated.  Review of Systems  Constitutional: Negative for activity change, appetite change (improved), diaphoresis, fatigue and fever.  Respiratory: Negative for cough, chest tightness, shortness of breath and wheezing.   Cardiovascular: Negative for chest pain, palpitations and leg swelling.  Gastrointestinal: Negative for abdominal distention, abdominal pain, blood in stool, constipation, diarrhea, nausea and vomiting.  Neurological: Negative.   Psychiatric/Behavioral: Negative for decreased concentration, self-injury, sleep disturbance and suicidal ideas. The patient is not nervous/anxious.    Per HPI unless specifically indicated above     Objective:    BP 128/82 (BP Location: Left Arm)   Pulse 65   Temp 98.7 F (37.1 C) (Oral)   Wt 125 lb 9.6 oz (57 kg)   LMP  (LMP Unknown)   SpO2 99%   BMI 26.08 kg/m   Wt Readings from  Last 3 Encounters:  03/25/21 125 lb 9.6 oz (57 kg)  12/18/20 119 lb 12.8 oz (54.3 kg)  11/19/20 123 lb (55.8 kg)    Physical Exam Vitals and nursing note reviewed.  Constitutional:      General: She is awake. She is not in acute distress.    Appearance: She is well-developed and well-groomed. She is not ill-appearing.  HENT:     Head: Normocephalic.     Right Ear: Hearing normal.     Left Ear: Hearing normal.     Nose: Nose normal.     Mouth/Throat:     Mouth: Mucous membranes are moist.  Eyes:     General: Lids are normal.        Right eye: No discharge.        Left eye: No discharge.     Conjunctiva/sclera: Conjunctivae normal.     Pupils: Pupils are equal, round, and reactive to light.  Neck:     Thyroid: No thyromegaly.     Vascular: No carotid bruit.  Cardiovascular:     Rate and Rhythm: Normal rate and regular rhythm.  Heart sounds: Normal heart sounds. No murmur heard. No gallop.   Pulmonary:     Effort: Pulmonary effort is normal. No accessory muscle usage or respiratory distress.     Breath sounds: Normal breath sounds.  Abdominal:     General: Bowel sounds are normal. There is no distension or abdominal bruit.     Palpations: Abdomen is soft. There is no hepatomegaly or splenomegaly.     Tenderness: There is no abdominal tenderness.     Hernia: No hernia is present.  Musculoskeletal:     Cervical back: Normal range of motion and neck supple.     Right lower leg: No edema.     Left lower leg: No edema.  Lymphadenopathy:     Head:     Right side of head: No submental, submandibular, tonsillar, preauricular or posterior auricular adenopathy.     Left side of head: No submental, submandibular, tonsillar, preauricular or posterior auricular adenopathy.     Cervical: No cervical adenopathy.  Skin:    General: Skin is warm and dry.  Neurological:     Mental Status: She is alert and oriented to person, place, and time.  Psychiatric:        Attention and  Perception: Attention normal.        Mood and Affect: Mood normal.        Speech: Speech normal.        Behavior: Behavior normal. Behavior is cooperative.    Results for orders placed or performed in visit on 11/06/20  Urine Culture   Specimen: Urine   UR  Result Value Ref Range   Urine Culture, Routine Final report    Organism ID, Bacteria Comment   Microscopic Examination   Urine  Result Value Ref Range   WBC, UA 0-5 0 - 5 /hpf   RBC 0-2 0 - 2 /hpf   Epithelial Cells (non renal) 0-10 0 - 10 /hpf   Bacteria, UA Few None seen/Few  TSH  Result Value Ref Range   TSH 0.963 0.450 - 4.500 uIU/mL  Comprehensive metabolic panel  Result Value Ref Range   Glucose 93 65 - 99 mg/dL   BUN 26 8 - 27 mg/dL   Creatinine, Ser 2.32 (H) 0.57 - 1.00 mg/dL   GFR calc non Af Amer 21 (L) >59 mL/min/1.73   GFR calc Af Amer 24 (L) >59 mL/min/1.73   BUN/Creatinine Ratio 11 (L) 12 - 28   Sodium 138 134 - 144 mmol/L   Potassium 4.3 3.5 - 5.2 mmol/L   Chloride 109 (H) 96 - 106 mmol/L   CO2 15 (L) 20 - 29 mmol/L   Calcium 8.3 (L) 8.7 - 10.3 mg/dL   Total Protein 6.1 6.0 - 8.5 g/dL   Albumin 3.7 3.7 - 4.7 g/dL   Globulin, Total 2.4 1.5 - 4.5 g/dL   Albumin/Globulin Ratio 1.5 1.2 - 2.2   Bilirubin Total <0.2 0.0 - 1.2 mg/dL   Alkaline Phosphatase 51 44 - 121 IU/L   AST 9 0 - 40 IU/L   ALT 3 0 - 32 IU/L  Lipid Panel w/o Chol/HDL Ratio  Result Value Ref Range   Cholesterol, Total 147 100 - 199 mg/dL   Triglycerides 76 0 - 149 mg/dL   HDL 40 >39 mg/dL   VLDL Cholesterol Cal 15 5 - 40 mg/dL   LDL Chol Calc (NIH) 92 0 - 99 mg/dL  Urinalysis, Routine w reflex microscopic  Result Value Ref Range   Specific Gravity,  UA 1.010 1.005 - 1.030   pH, UA 5.0 5.0 - 7.5   Color, UA Yellow Yellow   Appearance Ur Clear Clear   Leukocytes,UA Negative Negative   Protein,UA Negative Negative/Trace   Glucose, UA Negative Negative   Ketones, UA Negative Negative   RBC, UA 2+ (A) Negative   Bilirubin, UA  Negative Negative   Urobilinogen, Ur 0.2 0.2 - 1.0 mg/dL   Nitrite, UA Negative Negative   Microscopic Examination See below:       Assessment & Plan:   Problem List Items Addressed This Visit      Cardiovascular and Mediastinum   Hypertension    Chronic, ongoing with BP at goal today for age. Continue current medication regimen and adjust as needed.  Recommend she check BP at home at least 3 mornings a week and document.  DASH diet focus.  If ongoing lower reading next visit, consider discontinuation or reduction to 2.5 MG of Lisinopril.  LABS: CMP, TSH, lipid, urine check.  Return in 3 months.      Relevant Medications   lisinopril (ZESTRIL) 5 MG tablet   atorvastatin (LIPITOR) 10 MG tablet   Other Relevant Orders   Comprehensive metabolic panel   TSH   Aortic atherosclerosis ([place])    Noted on CT imaging 08/07/20.  Continue statin for prevention.  Recommend continued cessation of smoking.  No ASA due to ITP.      Relevant Medications   lisinopril (ZESTRIL) 5 MG tablet   atorvastatin (LIPITOR) 10 MG tablet     Respiratory   Centrilobular emphysema ([place]) - Primary    Noted on CT 02/03/2017.  Is a former smoker, reports quitting 4 years ago.  No current inhalers.  Would benefit from spirometry in future, refuses today.  Has repeat lung CT ordered, but has not attended or scheduled this yet -- highly recommend she scheduled this as soon as possible -- will work on this today while she is here, as she does not answer phone calls.  Will continue to work closely with CCM team on assist.        Nervous and Auditory   Chronic low back pain (Location of Primary Source of Pain) (Bilateral) (R>L) (Chronic)    Ongoing issue, previously followed by pain management years ago.  Continue Tizanidine as needed + renal dosed Gabapentin and may use OTC creams like Icy/Hot or Bengay.  Recommend alternating heat and ice as needed.  Zofran script sent for nausea with back pain on occasion -- she reports  this is minimal and has been issue for years.  Return in 6 weeks.  Did have 3+ blood on UA this visit, was 2+ in past ?kidney stones.  Have imaging ordered, but she has not attended -- highly recommend she scheduled this as soon as possible -- will work on this today while she is here, as she does not answer phone calls.       Relevant Medications   mirtazapine (REMERON) 15 MG tablet   risperiDONE (RISPERDAL) 2 MG tablet   tiZANidine (ZANAFLEX) 4 MG tablet   gabapentin (NEURONTIN) 300 MG capsule   Other Relevant Orders   Urinalysis, Routine w reflex microscopic     Musculoskeletal and Integument   Osteoporosis    Chronic, no recent DEXA in chart. Continue Evista daily, refills sent, and recommend Vitamin D daily.  Check level today.      Relevant Medications   raloxifene (EVISTA) 60 MG tablet   Other Relevant Orders  VITAMIN D 25 Hydroxy (Vit-D Deficiency, Fractures)     Genitourinary   CKD (chronic kidney disease) stage 3, GFR 30-59 ml/min ([facility])    Chronic, ongoing.  Continue current medication regimen and collaboration with nephrology.  CCM collaboration continues.  Urine noting 3+ blood today and 2+ in past.  Have ordered imaging in past, which she has not attended --- highly recommend she scheduled this as soon as possible -- will work on this today while she is here, as she does not answer phone calls.         Hematopoietic and Hemostatic   Idiopathic thrombocytopenic purpura ([place]) (Chronic)    Continue to collaborate with hematology, recent note reviewed.  She denies bleeding or increased bruising.  She missed labs with them, able to view what they ordered and will add on to labs today and provide to them upon resulting.         Other   Schizophrenia ([place])    Chronic, ongoing.  Continue current medication regimen, Risperdal, and adjust as needed.  Has been on this regimen for years with benefit.  Does have underlying paranoia at baseline.  Refills sent in.       Hyperlipidemia    Chronic, ongoing.  Continue current medication regimen and adjust as needed.  Lipid panel today.        Relevant Medications   lisinopril (ZESTRIL) 5 MG tablet   atorvastatin (LIPITOR) 10 MG tablet   Other Relevant Orders   Lipid Panel w/o Chol/HDL Ratio   MGUS (monoclonal gammopathy of unknown significance)    Continue to collaborate with hematology, recent note reviewed.  She denies bleeding or increased bruising.  She missed labs with them, able to view what they ordered and will add on to labs today and provide to them upon resulting.       Relevant Orders   CBC with Differential/Platelet   Comprehensive metabolic panel   Protein Electrophoresis, (serum)   IgG, IgA, IgM   Kappa/lambda light chains   Personal history of tobacco use, presenting hazards to health    Lung CT order placed due to weight loss and appetite changes, discussed at length with patient need to obtain this, even though improving.  Recommend continued cessation of smoking.      Vitamin D insufficiency    History of low levels, recheck today and adjust supplement as needed.      Abnormal weight loss    Weight stable and is gaining now, appetite reported to be improving -- ?related to her mood and schizophrenia -- does have some paranoid thought processes at baseline.  She denies any symptoms at this time, no B symptoms.  Has diagnosis of underlying MGUS and ITP, followed by oncology.  Continue collaboration with GI due to weight loss and history of smoking no previous colonoscopy -- had negative iFOBT -- reorder today as is due.  Have recommended she obtain colonoscopy, has not obtained as of yet and missed this.  Highly recommend she still obtain lung and abdominal CT, even though some improvement present -- highly recommend she scheduled this as soon as possible -- will work on this today while she is here, as she does not answer phone calls.  Will continue to work with CCM team on assistance  needs, patient is very difficult to get in touch with and does not always follow direction -- appears to have low support per patient report.  Return in 6 months.      Hematuria    ?  kidney stones with her intermittent acute episodes of back pain.  Have ordered CT abdomen/pelvis in past, but she has missed having done -- highly recommend she scheduled this as soon as possible -- will work on this today while she is here, as she does not answer phone calls.  Will continue to work with CCM team on assistance needs, patient is very difficult to get in touch with and does not always follow direction -- appears to have low support per patient report.  Recheck next visit and continue to collaborate with nephrology.       Other Visit Diagnoses    Colon cancer screening       FOBT ordered today   Relevant Orders   Fecal occult blood, imunochemical       Follow up plan: Return in about 6 months (around 09/25/2021) for MOOD, COPD, HTN/HLD, BACK PAIN, WEIGHT CHECK, MGUS.

## 2021-03-25 NOTE — Assessment & Plan Note (Signed)
Chronic, ongoing with BP at goal today for age. Continue current medication regimen and adjust as needed.  Recommend she check BP at home at least 3 mornings a week and document.  DASH diet focus.  If ongoing lower reading next visit, consider discontinuation or reduction to 2.5 MG of Lisinopril.  LABS: CMP, TSH, lipid, urine check.  Return in 3 months.

## 2021-03-25 NOTE — Assessment & Plan Note (Signed)
Continue to collaborate with hematology, recent note reviewed.  She denies bleeding or increased bruising.  She missed labs with them, able to view what they ordered and will add on to labs today and provide to them upon resulting.

## 2021-03-25 NOTE — Assessment & Plan Note (Addendum)
?  kidney stones with her intermittent acute episodes of back pain.  Have ordered CT abdomen/pelvis in past, but she has missed having done -- highly recommend she scheduled this as soon as possible -- will work on this today while she is here, as she does not answer phone calls.  Will continue to work with CCM team on assistance needs, patient is very difficult to get in touch with and does not always follow direction -- appears to have low support per patient report.  Recheck next visit and continue to collaborate with nephrology.

## 2021-03-25 NOTE — Assessment & Plan Note (Signed)
Chronic, no recent DEXA in chart. Continue Evista daily, refills sent, and recommend Vitamin D daily.  Check level today.

## 2021-03-25 NOTE — Assessment & Plan Note (Signed)
Chronic, ongoing.  Continue current medication regimen and adjust as needed. Lipid panel today. 

## 2021-03-25 NOTE — Assessment & Plan Note (Signed)
History of low levels, recheck today and adjust supplement as needed. 

## 2021-03-25 NOTE — Assessment & Plan Note (Signed)
Noted on CT 02/03/2017.  Is a former smoker, reports quitting 4 years ago.  No current inhalers.  Would benefit from spirometry in future, refuses today.  Has repeat lung CT ordered, but has not attended or scheduled this yet -- highly recommend she scheduled this as soon as possible -- will work on this today while she is here, as she does not answer phone calls.  Will continue to work closely with CCM team on assist.

## 2021-03-25 NOTE — Assessment & Plan Note (Signed)
Chronic, ongoing.  Continue current medication regimen and collaboration with nephrology.  CCM collaboration continues.  Urine noting 3+ blood today and 2+ in past.  Have ordered imaging in past, which she has not attended --- highly recommend she scheduled this as soon as possible -- will work on this today while she is here, as she does not answer phone calls.

## 2021-03-25 NOTE — Assessment & Plan Note (Signed)
Weight stable and is gaining now, appetite reported to be improving -- ?related to her mood and schizophrenia -- does have some paranoid thought processes at baseline.  She denies any symptoms at this time, no B symptoms.  Has diagnosis of underlying MGUS and ITP, followed by oncology.  Continue collaboration with GI due to weight loss and history of smoking no previous colonoscopy -- had negative iFOBT -- reorder today as is due.  Have recommended she obtain colonoscopy, has not obtained as of yet and missed this.  Highly recommend she still obtain lung and abdominal CT, even though some improvement present -- highly recommend she scheduled this as soon as possible -- will work on this today while she is here, as she does not answer phone calls.  Will continue to work with CCM team on assistance needs, patient is very difficult to get in touch with and does not always follow direction -- appears to have low support per patient report.  Return in 6 months.

## 2021-03-25 NOTE — Patient Instructions (Signed)

## 2021-03-25 NOTE — Assessment & Plan Note (Signed)
Ongoing issue, previously followed by pain management years ago.  Continue Tizanidine as needed + renal dosed Gabapentin and may use OTC creams like Icy/Hot or Bengay.  Recommend alternating heat and ice as needed.  Zofran script sent for nausea with back pain on occasion -- she reports this is minimal and has been issue for years.  Return in 6 weeks.  Did have 3+ blood on UA this visit, was 2+ in past ?kidney stones.  Have imaging ordered, but she has not attended -- highly recommend she scheduled this as soon as possible -- will work on this today while she is here, as she does not answer phone calls.

## 2021-03-25 NOTE — Assessment & Plan Note (Signed)
Chronic, ongoing.  Continue current medication regimen, Risperdal, and adjust as needed.  Has been on this regimen for years with benefit.  Does have underlying paranoia at baseline.  Refills sent in.

## 2021-03-26 LAB — URINALYSIS, ROUTINE W REFLEX MICROSCOPIC
Bilirubin, UA: NEGATIVE
Glucose, UA: NEGATIVE
Ketones, UA: NEGATIVE
Leukocytes,UA: NEGATIVE
Nitrite, UA: NEGATIVE
Protein,UA: NEGATIVE
Specific Gravity, UA: <1.005 — ABNORMAL LOW (ref 1.005–1.030)
Urobilinogen, Ur: 0.2 mg/dL (ref 0.2–1.0)
pH, UA: 5.5 (ref 5.0–7.5)

## 2021-03-26 LAB — MICROSCOPIC EXAMINATION
Bacteria, UA: NONE SEEN
WBC, UA: NONE SEEN /hpf (ref 0–5)

## 2021-03-26 NOTE — Progress Notes (Signed)
Please let Denise Everett know her labs have returned.  Platelets remain on lower side at 85 this check, I recommend she ensure she keeps her follow-up visits with oncology and I will pass these labs on to them so they are aware.  Kidney function continue to show chronic kidney disease -- recommend continue visits with your kidney doctor. Cholesterol levels are stable with mild elevation in LDL (bad cholesterol) please ensure you take your Atorvastatin daily.  Thyroid and Vitamin D normal.  I am awaiting a few more of the oncology labs and will forward those to your oncology team for review.  Any questions? Keep being awesome!!  Thank you for allowing me to participate in your care.  I appreciate you. Kindest regards, Fabyan Loughmiller

## 2021-03-27 LAB — PROTEIN ELECTROPHORESIS, SERUM
A/G Ratio: 1.3 (ref 0.7–1.7)
Albumin ELP: 3.8 g/dL (ref 2.9–4.4)
Alpha 1: 0.2 g/dL (ref 0.0–0.4)
Alpha 2: 0.6 g/dL (ref 0.4–1.0)
Beta: 0.8 g/dL (ref 0.7–1.3)
Gamma Globulin: 1.3 g/dL (ref 0.4–1.8)
Globulin, Total: 2.9 g/dL (ref 2.2–3.9)
M-Spike, %: 0.6 g/dL — ABNORMAL HIGH

## 2021-03-27 LAB — COMPREHENSIVE METABOLIC PANEL
ALT: 5 IU/L (ref 0–32)
AST: 14 IU/L (ref 0–40)
Albumin/Globulin Ratio: 1.6 (ref 1.2–2.2)
Albumin: 4.1 g/dL (ref 3.7–4.7)
Alkaline Phosphatase: 71 IU/L (ref 44–121)
BUN/Creatinine Ratio: 6 — ABNORMAL LOW (ref 12–28)
BUN: 11 mg/dL (ref 8–27)
Bilirubin Total: <0.2 mg/dL (ref 0.0–1.2)
CO2: 18 mmol/L — ABNORMAL LOW (ref 20–29)
Calcium: 8.7 mg/dL (ref 8.7–10.3)
Chloride: 102 mmol/L (ref 96–106)
Creatinine, Ser: 1.73 mg/dL — ABNORMAL HIGH (ref 0.57–1.00)
Globulin, Total: 2.6 g/dL (ref 1.5–4.5)
Glucose: 70 mg/dL (ref 65–99)
Potassium: 4.4 mmol/L (ref 3.5–5.2)
Sodium: 135 mmol/L (ref 134–144)
Total Protein: 6.7 g/dL (ref 6.0–8.5)
eGFR: 31 mL/min/{1.73_m2} — ABNORMAL LOW (ref >59)

## 2021-03-27 LAB — IGG, IGA, IGM
IgA/Immunoglobulin A, Serum: 132 mg/dL (ref 64–422)
IgG (Immunoglobin G), Serum: 1384 mg/dL (ref 586–1602)
IgM (Immunoglobulin M), Srm: 116 mg/dL (ref 26–217)

## 2021-03-27 LAB — CBC WITH DIFFERENTIAL/PLATELET
Basophils Absolute: 0.1 10*3/uL (ref 0.0–0.2)
Basos: 1 %
EOS (ABSOLUTE): 0.2 10*3/uL (ref 0.0–0.4)
Eos: 5 %
Hematocrit: 36.4 % (ref 34.0–46.6)
Hemoglobin: 11.7 g/dL (ref 11.1–15.9)
Immature Grans (Abs): 0 10*3/uL (ref 0.0–0.1)
Immature Granulocytes: 1 %
Lymphocytes Absolute: 2.4 10*3/uL (ref 0.7–3.1)
Lymphs: 48 %
MCH: 29.3 pg (ref 26.6–33.0)
MCHC: 32.1 g/dL (ref 31.5–35.7)
MCV: 91 fL (ref 79–97)
Monocytes Absolute: 0.4 10*3/uL (ref 0.1–0.9)
Monocytes: 9 %
Neutrophils Absolute: 1.7 10*3/uL (ref 1.4–7.0)
Neutrophils: 36 %
Platelets: 85 10*3/uL — CL (ref 150–450)
RBC: 4 x10E6/uL (ref 3.77–5.28)
RDW: 14.4 % (ref 11.7–15.4)
WBC: 4.8 10*3/uL (ref 3.4–10.8)

## 2021-03-27 LAB — LIPID PANEL W/O CHOL/HDL RATIO
Cholesterol, Total: 171 mg/dL (ref 100–199)
HDL: 49 mg/dL (ref >39)
LDL Chol Calc (NIH): 105 mg/dL — ABNORMAL HIGH (ref 0–99)
Triglycerides: 89 mg/dL (ref 0–149)
VLDL Cholesterol Cal: 17 mg/dL (ref 5–40)

## 2021-03-27 LAB — KAPPA/LAMBDA LIGHT CHAINS
Ig Kappa Free Light Chain: 104.7 mg/L — ABNORMAL HIGH (ref 3.3–19.4)
Ig Lambda Free Light Chain: 55.1 mg/L — ABNORMAL HIGH (ref 5.7–26.3)
KAPPA/LAMBDA RATIO: 1.9 — ABNORMAL HIGH (ref 0.26–1.65)

## 2021-03-27 LAB — TSH: TSH: 0.58 u[IU]/mL (ref 0.450–4.500)

## 2021-03-27 LAB — VITAMIN D 25 HYDROXY (VIT D DEFICIENCY, FRACTURES): Vit D, 25-Hydroxy: 39.7 ng/mL (ref 30.0–100.0)

## 2021-03-27 NOTE — Progress Notes (Signed)
TY so much Jolene.   Faythe Casa, NP 03/27/2021 1:04 PM

## 2021-04-05 ENCOUNTER — Ambulatory Visit: Admission: RE | Admit: 2021-04-05 | Payer: Medicare Other | Source: Ambulatory Visit

## 2021-06-24 ENCOUNTER — Ambulatory Visit: Payer: Medicare Other | Admitting: Nurse Practitioner

## 2021-06-26 ENCOUNTER — Telehealth: Payer: Self-pay

## 2021-07-09 ENCOUNTER — Telehealth: Payer: Medicare Other

## 2021-07-19 ENCOUNTER — Ambulatory Visit (INDEPENDENT_AMBULATORY_CARE_PROVIDER_SITE_OTHER): Payer: Medicare Other

## 2021-07-19 ENCOUNTER — Telehealth: Payer: Self-pay | Admitting: Oncology

## 2021-07-19 ENCOUNTER — Telehealth: Payer: Medicare Other | Admitting: General Practice

## 2021-07-19 DIAGNOSIS — D472 Monoclonal gammopathy: Secondary | ICD-10-CM

## 2021-07-19 DIAGNOSIS — I10 Essential (primary) hypertension: Secondary | ICD-10-CM

## 2021-07-19 DIAGNOSIS — N1832 Chronic kidney disease, stage 3b: Secondary | ICD-10-CM

## 2021-07-19 NOTE — Chronic Care Management (AMB) (Signed)
Chronic Care Management   CCM RN Visit Note  07/19/2021 Name: Denise Everett MRN: [id number] DOB: [birth year]  Subjective: BRAYLEE LAL is a 70 y.o. year old female who is a primary care patient of Cannady, Barbaraann Faster, NP. The care management team was consulted for assistance with disease management and care coordination needs.    Engaged with patient by telephone for follow up visit in response to provider referral for case management and/or care coordination services.   Consent to Services:  The patient was given information about Chronic Care Management services, agreed to services, and gave verbal consent prior to initiation of services.  Please see initial visit note for detailed documentation.   Patient agreed to services and verbal consent obtained.   Assessment: Review of patient past medical history, allergies, medications, health status, including review of consultants reports, laboratory and other test data, was performed as part of comprehensive evaluation and provision of chronic care management services.   SDOH (Social Determinants of Health) assessments and interventions performed:  SDOH Interventions    Flowsheet Row Most Recent Value  SDOH Interventions   Physical Activity Interventions Other (Comments)  [No structured activity]  Social Connections Interventions Other (Comment)  [has good support system]        CCM Care Plan  Allergies  Allergen Reactions   No Known Allergies     Outpatient Encounter Medications as of 07/19/2021  Medication Sig   atorvastatin (LIPITOR) 10 MG tablet TAKE 1 TABLET BY MOUTH DAILY AT 6 PM   diclofenac Sodium (VOLTAREN) 1 % GEL Apply 4 g topically 4 (four) times daily.   famotidine (PEPCID) 20 MG tablet Take 1 tablet (20 mg total) by mouth daily.   gabapentin (NEURONTIN) 300 MG capsule Take 1 capsule (300 mg total) by mouth 2 (two) times daily.   lisinopril (ZESTRIL) 5 MG tablet Take 1 tablet (5 mg total) by mouth daily.    mirtazapine (REMERON) 15 MG tablet Take 0.5 tablets (7.5 mg total) by mouth at bedtime.   ondansetron (ZOFRAN ODT) 4 MG disintegrating tablet Take 1 tablet (4 mg total) by mouth every 8 (eight) hours as needed for nausea or vomiting.   raloxifene (EVISTA) 60 MG tablet Take 1 tablet (60 mg total) by mouth daily.   risperiDONE (RISPERDAL) 2 MG tablet Take 0.5-1 tablets (1-2 mg total) by mouth at bedtime.   tiZANidine (ZANAFLEX) 4 MG tablet Take 1 tablet (4 mg total) by mouth every 6 (six) hours as needed for muscle spasms.   No facility-administered encounter medications on file as of 07/19/2021.    Patient Active Problem List   Diagnosis Date Noted   Hematuria 03/25/2021   Aortic atherosclerosis ([place]) 10/20/2020   Centrilobular emphysema ([place]) 10/20/2020   Abnormal weight loss 03/28/2020   CKD (chronic kidney disease) stage 3, GFR 30-59 ml/min (HCC) 02/29/2020   Osteoarthritis of lumbar spine 04/15/2017   Lumbar facet arthropathy 04/15/2017   Lumbar facet syndrome (Bilateral) (R>L) 04/15/2017   Vitamin D insufficiency 03/18/2017   Marijuana use 03/03/2017   Chronic pain syndrome 02/24/2017   Chronic ankle pain  (Location of Tertiary source of pain) (Bilateral) (R>L) 02/24/2017   Abnormal MRI, lumbar spine (06/22/2014) 02/24/2017   Lumbar central spinal stenosis (Severe at L3-4) 02/24/2017   Lumbar foraminal stenosis (Severe: Left L3-4; Right L4-5; Bilateral L5-S1) 02/24/2017   Lumbar lateral recess stenosis (Severe: Left L3-4; Right L4-5; Left L5-S1) 02/24/2017   Personal history of tobacco use, presenting hazards to health 02/03/2017  Advance care planning 10/08/2016   MGUS (monoclonal gammopathy of unknown significance) 06/23/2016   Schizophrenia ([place]) 06/07/2015   Osteoporosis 06/07/2015   Chronic low back pain (Location of Primary Source of Pain) (Bilateral) (R>L) 06/07/2015   Idiopathic thrombocytopenic purpura ([place]) 06/07/2015   Allergic rhinitis 06/07/2015   Chronic leg pain  06/07/2015   Hyperlipidemia 06/07/2015   Hypertension 06/07/2015   Lumbar IVDD (intervertebral disc displacement) 09/29/2014   Neuritis or radiculitis due to rupture of lumbar intervertebral disc 09/29/2014   Chronic Lumbar radiculitis 09/29/2014   Lumbar HNP (herniated nucleus pulposus) 09/29/2014    Conditions to be addressed/monitored:HTN, CKD Stage 3, and MGUS and elevated platelet count  Care Plan : RNCM: Chronic Kidney (Adult)  Updates made by Vanita Ingles, RN since 07/19/2021 12:00 AM     Problem: RNCM: Adjustment to Chronic Kidney Disease   Priority: Medium     Long-Range Goal: RNCM: CKD   Start Date: 12/26/2020  Expected End Date: 04/29/2022  This Visit's Progress: On track  Priority: Medium  Note:   Current Barriers:  Knowledge Deficits related to taking sodium bicarbonate as prescribed by the provider and the need to take medications as prescribed for kidney health  Chronic Disease Management support and education needs related to effective management of CKD3 Lacks caregiver support.  Unable to independently manage CKD Unable to self administer medications as prescribed Does not attend all scheduled provider appointments Does not adhere to prescribed medication regimen Lacks social connections Does not contact provider office for questions/concerns  Nurse Case Manager Clinical Goal(s):  patient will verbalize understanding of plan for effective management of CKD3 patient will work with RNCM, pcp, and specialist  to address needs related to CKD patient will attend all scheduled medical appointments: 09-25-2021 at 120 pm patient will demonstrate improved adherence to prescribed treatment plan for CKD as evidenced bycompliance with medications regimen and working with the CCM team to manage health and well being.   Interventions:  1:1 collaboration with Venita Lick, NP regarding development and update of comprehensive plan of care as evidenced by provider  attestation and co-signature Inter-disciplinary care team collaboration (see longitudinal plan of care) Evaluation of current treatment plan related to CKD stage 3 and patient's adherence to plan as established by provider. Advised patient to call the office for changes or questions  Provided education to patient re: taking medications as prescribed, wt loss, safety Reviewed medications with patient and discussed compliance. The patient states that she was supposed to be taking Sodium Bicarb but did not get it from pharmacy. The patient has been instructed to take 1300 mg BID. Education provided and the patient states that she is supposed to get the medication from the pharmacy today. 07-18-2021: The patient states that she is taking her medications as directed however it is uncertain if she is due to long standing history of non-compliance. Encouraged the patient to keep appointments and take medications as prescribed.  Reviewed scheduled/upcoming provider appointments including: 09-25-2021 at 1:20 pm Discussed plans with patient for ongoing care management follow up and provided patient with direct contact information for care management team  Patient Goals/Self-Care Activities patient will:  - Patient will self administer medications as prescribed Patient will attend all scheduled provider appointments Patient will call pharmacy for medication refills Patient will attend church or other social activities Patient will continue to perform ADL's independently Patient will continue to perform IADL's independently Patient will call provider office for new concerns or questions Patient will work with  BSW to address care coordination needs and will continue to work with the clinical team to address health care and disease management related needs.   - counseling provided - decision-making supported - depression screen reviewed - family care conference arranged - family involvement promoted - goal  setting facilitated - self-care encouraged - self-reflection promoted - verbalization of feelings encouraged  Follow Up Plan: Telephone follow up appointment with care management team member scheduled for: 09-24-2021 at 71 am        Task: RNCM: Support Psychological Response to Chronic Kidney Disease Completed 07/19/2021  Outcome: Positive  Note:   Care Management Activities:    - counseling provided - decision-making supported - depression screen reviewed - family care conference arranged - family involvement promoted - goal setting facilitated - self-care encouraged - self-reflection promoted - verbalization of feelings encouraged        Care Plan : RNCM: Hypertension (Adult)  Updates made by Vanita Ingles, RN since 07/19/2021 12:00 AM     Problem: RNCM: Hypertension (Hypertension)   Priority: Medium     Long-Range Goal: RNCM: Hypertension Monitored   Start Date: 12/26/2020  Expected End Date: 04/29/2022  This Visit's Progress: On track  Priority: Medium  Note:   Objective:  Last practice recorded BP readings:  BP Readings from Last 3 Encounters:  03/25/21 128/82  12/18/20 116/79  11/06/20 99/63  Elevated blood pressure at kidney specialist on Tuesday 12-25-2020 Most recent eGFR/CrCl: No results found for: EGFR  No components found for: CRCL Current Barriers:  Knowledge Deficits related to basic understanding of hypertension pathophysiology and self care management Knowledge Deficits related to understanding of medications prescribed for management of hypertension Non-adherence to prescribed medication regimen Non-adherence to scheduled provider appointments Limited Social Support Unable to independently manage HTN Does not attend all scheduled provider appointments Does not adhere to prescribed medication regimen Lacks social connections Does not contact provider office for questions/concerns Case Manager Clinical Goal(s):  patient will verbalize  understanding of plan for hypertension management patient will attend all scheduled medical appointments: 09-25-2021   patient will demonstrate improved adherence to prescribed treatment plan for hypertension as evidenced by taking all medications as prescribed, monitoring and recording blood pressure as directed, adhering to low sodium/DASH diet  patient will demonstrate improved health management independence as evidenced by checking blood pressure as directed and notifying PCP if SBP>160 or DBP > 90, taking all medications as prescribe, and adhering to a low sodium diet as discussed. patient will verbalize basic understanding of hypertension disease process and self health management plan as evidenced by taking medications as directed, following a heart health diet, working with the CCM team to manage health and well being.  Interventions:  Collaboration with Venita Lick, NP regarding development and update of comprehensive plan of care as evidenced by provider attestation and co-signature Inter-disciplinary care team collaboration (see longitudinal plan of care) Evaluation of current treatment plan related to hypertension self management and patient's adherence to plan as established by provider. 07-19-2021: The patient states she is doing well. She states she did not have her colonoscopy due to transportation that it "fell through". The patient states that she missed some of her other appointments due to misplacing her papers and not finding out until it was too late to call the "wagon". The patient denies any acute findings and says she is sleeping good, eating good, and actually has gained some weight. Will continue to monitor. Encouraged the patient to reschedule appointments for follow  up.  Provided education to patient re: stroke prevention, s/s of heart attack and stroke, DASH diet, complications of uncontrolled blood pressure Reviewed medications with patient and discussed importance of  compliance. 07-19-2021: States she is compliant with medications.  Discussed plans with patient for ongoing care management follow up and provided patient with direct contact information for care management team Advised patient, providing education and rationale, to monitor blood pressure daily and record, calling PCP for findings outside established parameters.  Reviewed scheduled/upcoming provider appointments including: 09-25-2021 Patient Goals/Self-Care Activities  patient will:  - Self administers medications as prescribed Attends all scheduled provider appointments Calls provider office for new concerns, questions, or BP outside discussed parameters Checks BP and records as discussed Follows a low sodium diet/DASH diet - depression screen reviewed - home or ambulatory blood pressure monitoring encouraged Follow Up Plan: Telephone follow up appointment with care management team member scheduled for: 09-24-2021 at 61 am    Task: RNCM: Identify and Monitor Blood Pressure Elevation Completed 07/19/2021  Outcome: Positive  Note:   Care Management Activities:    - depression screen reviewed - home or ambulatory blood pressure monitoring encouraged        Care Plan : RNCM: MGUS and increased platelets  Updates made by Vanita Ingles, RN since 07/19/2021 12:00 AM     Problem: RNCM: Management of MGUS and increased platelet count   Priority: High     Long-Range Goal: RNCM: Increased platelets and MGUS   Start Date: 07/19/2021  Expected End Date: 07/19/2022  This Visit's Progress: Not on track  Priority: High  Note:   Current Barriers:  Ineffective Self Health Maintenance in a patient with  MGUS and increased platelet count Unable to independently manage MGUS and increased platelet count due to noncompliance and missed specialist appointments Unable to self administer medications as prescribed Does not attend all scheduled provider appointments Does not adhere to prescribed  medication regimen Lacks social connections Does not maintain contact with provider office Does not contact provider office for questions/concerns No shows to appointments and non-compliance for treat plan established Clinical Goal(s):  Collaboration with Venita Lick, NP regarding development and update of comprehensive plan of care as evidenced by provider attestation and co-signature Inter-disciplinary care team collaboration (see longitudinal plan of care) patient will work with care management team to address care coordination and chronic disease management needs related to Mental Health Counseling Level of Care Concerns   Interventions:  Evaluation of current treatment plan related to  MGUS and increased platelet level , Transportation, Level of care concerns, and Mental Health Concerns  self-management and patient's adherence to plan as established by provider. Collaboration with Venita Lick, NP regarding development and update of comprehensive plan of care as evidenced by provider attestation       and co-signature Inter-disciplinary care team collaboration (see longitudinal plan of care) Discussed plans with patient for ongoing care management follow up and provided patient with direct contact information for care management team In basket message  (07-19-2021) sent to Dr. Gary Fleet staff asking for assistance with getting the patient a new follow up visit. Per the patient she has called and left messages for them to call her back and reschedule. Secure chat with staff from Dr. Gary Fleet office and they will reach out to the patient but state the patient has had several no show appointments and has not reached back out to the office. Will collaborate with staff to see if RNCM can assist with the  patient keeping follow up appointments with specialist.  Self Care Activities:  Patient verbalizes understanding of plan to effectively manage MGUS and increased platelet count Self  administers medications as prescribed Attends all scheduled provider appointments Calls pharmacy for medication refills Attends church or other social activities Performs ADL's independently Performs IADL's independently Calls provider office for new concerns or questions Call for adequate transportation to make all appointments for follow up with Dr. Grayland Ormond  Patient Goals: -Keep appointments to see Dr. Grayland Ormond, if cannot keep appointment call and reschedule -Arragne transportation in timely manner -Call the office for questions, concerns, or changes in conditions.  Follow Up Plan: Telephone follow up appointment with care management team member scheduled for: 09-24-2021 at 32 am      Plan:Telephone follow up appointment with care management team member scheduled for:  09-24-2021 at 30 am   [place], MSN, [facility] Mobile: [phone]

## 2021-07-19 NOTE — Patient Instructions (Signed)
Visit Information  PATIENT GOALS:  Goals Addressed             This Visit's Progress    RNCM: Colonoscopy scheduled       Timeframe:  Long-Range Goal Priority:  High Start Date:    12-26-2020                         Expected End Date:    01-23-2022                  Follow Up Date 09-24-2021   - tell my story and reason for my visit - make a list of questions - ask questions - repeat what I heard to make sure I understand - bring a list of my medicines to the visit - speak up when I don't understand    Why is this important?   The best way to learn about your health and care is by talking to the doctor and nurse.  They will answer your questions and give you information in the way that you like best.    Notes: The patient is scheduled for a colonoscopy on 01-23-2021.  Her sister is going with her to the procedure and will stay with her post procedure. Expressed the importance of completing the colonoscopy. 07-19-2021: Patient did not go to colonoscopy. States she could not get to the appointment.      RNCM: Follow My Treatment Plan-Chronic Kidney       Timeframe:  Long-Range Goal Priority:  High Start Date:        12-26-2020                     Expected End Date:  03-09-2022                    Follow Up Date 09-24-2021   - ask for help if I can't afford my medicines - avoid negative self-talk - call for medicine refill 2 or 3 days before it runs out - call the doctor or nurse before I stop taking medicine - call the doctor or nurse to get help with side effects - keep follow-up appointments - keep taking my medicines, even when I feel good - set alarm to remind of prescription refill date(s) - set alarm to remind to take medications    Why is this important?   Staying as healthy as you can is very important. This may mean making changes if you smoke, don't exercise or eat poorly.  A healthy lifestyle is an important goal for you.  Following the treatment plan and making  changes may be hard.  Try some of these steps to help keep the disease from getting worse.     Notes: Has not been taking sodium bicarbonate as prescribed.  07-19-2021: The patient missed her appointment to the kidney specialist and states she has called to reschedule but cannot get them to call her back. Education on the need to follow up with the specialist for optimal health and well being. Will continue to monitor.         The patient verbalized understanding of instructions, educational materials, and care plan provided today and declined offer to receive copy of patient instructions, educational materials, and care plan.   Telephone follow up appointment with care management team member scheduled for:09-24-2021 at 38 am  [place], MSN, [facility]  [place] Mobile: [phone]

## 2021-07-19 NOTE — Telephone Encounter (Signed)
Patient accepted appt for 9/21 with J. Burns. Asked to please contact the clinic if she can't make the appt.

## 2021-07-31 ENCOUNTER — Inpatient Hospital Stay: Payer: Medicare Other | Admitting: Oncology

## 2021-07-31 ENCOUNTER — Inpatient Hospital Stay: Payer: Medicare Other

## 2021-08-09 DIAGNOSIS — I10 Essential (primary) hypertension: Secondary | ICD-10-CM

## 2021-08-27 ENCOUNTER — Ambulatory Visit (INDEPENDENT_AMBULATORY_CARE_PROVIDER_SITE_OTHER): Payer: Medicare Other | Admitting: Nurse Practitioner

## 2021-08-27 ENCOUNTER — Other Ambulatory Visit: Payer: Self-pay

## 2021-08-27 ENCOUNTER — Encounter: Payer: Self-pay | Admitting: Nurse Practitioner

## 2021-08-27 VITALS — BP 129/83 | HR 64 | Temp 98.4°F | Ht 59.5 in | Wt 132.2 lb

## 2021-08-27 DIAGNOSIS — J432 Centrilobular emphysema: Secondary | ICD-10-CM

## 2021-08-27 DIAGNOSIS — D693 Immune thrombocytopenic purpura: Secondary | ICD-10-CM | POA: Diagnosis not present

## 2021-08-27 DIAGNOSIS — I10 Essential (primary) hypertension: Secondary | ICD-10-CM | POA: Diagnosis not present

## 2021-08-27 DIAGNOSIS — N1832 Chronic kidney disease, stage 3b: Secondary | ICD-10-CM | POA: Diagnosis not present

## 2021-08-27 DIAGNOSIS — E7800 Pure hypercholesterolemia, unspecified: Secondary | ICD-10-CM

## 2021-08-27 DIAGNOSIS — F209 Schizophrenia, unspecified: Secondary | ICD-10-CM

## 2021-08-27 DIAGNOSIS — Z23 Encounter for immunization: Secondary | ICD-10-CM | POA: Diagnosis not present

## 2021-08-27 DIAGNOSIS — Z0289 Encounter for other administrative examinations: Secondary | ICD-10-CM

## 2021-08-27 DIAGNOSIS — D472 Monoclonal gammopathy: Secondary | ICD-10-CM | POA: Diagnosis not present

## 2021-08-27 LAB — MICROALBUMIN, URINE WAIVED
Creatinine, Urine Waived: 50 mg/dL (ref 10–300)
Microalb, Ur Waived: 10 mg/L (ref 0–19)

## 2021-08-27 NOTE — Assessment & Plan Note (Signed)
Chronic, ongoing with BP at goal today for age. Continue current medication regimen and adjust as needed.  Recommend she check BP at home at least 3 mornings a week and document.  DASH diet focus.  If ongoing lower reading next visit, consider discontinuation or reduction to 2.5 MG of Lisinopril.  LABS: CMP, CBC, urine check.  Return in 6 months.

## 2021-08-27 NOTE — Assessment & Plan Note (Signed)
Continue to collaborate with hematology, recent note reviewed.  She denies bleeding or increased bruising.  ?

## 2021-08-27 NOTE — Assessment & Plan Note (Signed)
Chronic, ongoing.  Continue current medication regimen and collaboration with nephrology.  CCM collaboration continues.  Obtain CMP and CBC + PTH and urine ALB today.  Highly recommend she get scheduled with nephrology for ongoing continuity of care.

## 2021-08-27 NOTE — Patient Instructions (Signed)
Chronic Kidney Disease, Adult Chronic kidney disease is when lasting damage happens to the kidneys slowly over a long time. The kidneys help to: Make pee (urine). Make hormones. Keep the right amount of fluids and chemicals in the body. Most often, this disease does not go away. You must take steps to help keep the kidney damage from getting worse. If steps are not taken, the kidneys might stop working forever. What are the causes? Diabetes. High blood pressure. Diseases that affect the heart and blood vessels. Other kidney diseases. Diseases of the body's disease-fighting system. A problem with the flow of pee. Infections of the organs that make pee, store it, and take it out of the body. Swelling or irritation of your blood vessels. What increases the risk? Getting older. Having someone in your family who has kidney disease or kidney failure. Having a disease caused by genes. Taking medicines often that harm the kidneys. Being near or having contact with harmful substances. Being very overweight. Using tobacco now or in the past. What are the signs or symptoms? Feeling very tired. Having a swollen face, legs, ankles, or feet. Feeling like you may vomit or vomiting. Not feeling hungry. Being confused or not able to focus. Twitches and cramps in the leg muscles or other muscles. Dry, itchy skin. A taste of metal in your mouth. Making less pee, or making more pee. Shortness of breath. Trouble sleeping. You may also become anemic or get weak bones. Anemic means there is not enough red blood cells or hemoglobin in your blood. You may get symptoms slowly. You may not notice them until the kidney damage gets very bad. How is this treated? Often, there is no cure for this disease. Treatment can help with symptoms and help keep the disease from getting worse. You may need to: Avoid alcohol. Avoid foods that are high in salt, potassium, phosphorous, and protein. Take medicines for  symptoms and to help control other conditions. Have dialysis. This treatment gets harmful waste out of your body. Treat other problems that cause your kidney disease or make it worse. Follow these instructions at home: Medicines Take over-the-counter and prescription medicines only as told by your doctor. Do not take any new medicines, vitamins, or supplements unless your doctor says it is okay. Lifestyle  Do not smoke or use any products that contain nicotine or tobacco. If you need help quitting, ask your doctor. If you drink alcohol: Limit how much you use to: 0-1 drink a day for women who are not pregnant. 0-2 drinks a day for men. Know how much alcohol is in your drink. In the [place] one drink equals one 12 oz bottle of beer (355 mL), one 5 oz glass of wine (148 mL), or one 1 oz glass of hard liquor (44 mL). Stay at a healthy weight. If you need help losing weight, ask your doctor. General instructions  Follow instructions from your doctor about what you cannot eat or drink. Track your blood pressure at home. Tell your doctor about any changes. If you have diabetes, track your blood sugar. Exercise at least 30 minutes a day, 5 days a week. Keep your shots (vaccinations) up to date. Keep all follow-up visits. Where to find more information American Association of Kidney Patients: www.aakp.org National Kidney Foundation: www.kidney.org American Kidney Fund: www.akfinc.org Life Options: www.lifeoptions.org Kidney School: www.kidneyschool.org Contact a doctor if: Your symptoms get worse. You get new symptoms. Get help right away if: You get symptoms of end-stage kidney disease. These   include: Headaches. Losing feeling in your hands or feet. Easy bruising. Having hiccups often. Chest pain. Shortness of breath. Lack of menstrual periods, in women. You have a fever. You make less pee than normal. You have pain or you bleed when you pee or poop. These symptoms may be an  emergency. Get help right away. Call your local emergency services (911 in the [place] Do not wait to see if the symptoms will go away. Do not drive yourself to the hospital. Summary Chronic kidney disease is when lasting damage happens to the kidneys slowly over a long time. Causes of this disease include diabetes and high blood pressure. Often, there is no cure for this disease. Treatment can help symptoms and help keep the disease from getting worse. Treatment may involve lifestyle changes, medicines, and dialysis. This information is not intended to replace advice given to you by your health care provider. Make sure you discuss any questions you have with your health care provider. Document Revised: 02/01/2020 Document Reviewed: 02/01/2020 Elsevier Patient Education  2022 Elsevier Inc.  

## 2021-08-27 NOTE — Progress Notes (Signed)
BP 129/83   Pulse 64   Temp 98.4 F (36.9 C)   Ht 4' 11.5" (1.511 m)   Wt 132 lb 4 oz (60 kg)   LMP  (LMP Unknown)   SpO2 100%   BMI 26.26 kg/m    Subjective:    Patient ID: Denise Everett, female    DOB: 08/03/1951, 70 y.o.   MRN: [id number]  HPI: Denise Everett is a 70 y.o. female  Chief Complaint  Patient presents with   Hypertension   Hypothyroidism   Schizophrenia    Needs paperwork to allow for aide to come help her -- they come to her house 2 hours a day.    SCHIZOPHRENIA/MGUS/THROMBOCYTOPENIA Continues on Mirtazapine and Risperdal for mood. Continues to maintain weight, has gained 6 pounds since last visit, maintaining.  Currently eating two meals a day, more than previous, does eat snacks in between.  Takes Pepcid daily for GERD.  Has not had colonoscopy, she was scheduled 02/05/21 but missed this as reported driver did not get her -- have attempted along with CCM team to get her in multiple times and she has missed. She saw GI on 05/01/20 last. Have been working with CCM team on assistance with this.  She denies night sweats, fever, decreased appetite at this time.   Denies SOB or CP.  History of smoking, started at 13 and quit 4 years ago. Had initial lung CT screening in 2018 which showed emphysema & aortic atherosclerosis and was to repeat in 12 months, missed screening -- she was to go for abdominal and lung CT imaging which PCP ordered, but has not attended yet and keeps forgetting to scheduling or missing appointment, even with CCM involvement.  IFOBT negative 02/16/20.   Followed by Dr. Grayland Ormond for MGUS and ITP, did see for follow-up on 01/18/21 and was to return in 6 months -- last CBC in May PLT 85 --  but missed appointment on review due to no transportation. Fever: no Nausea: no Vomiting: none Weight loss: yes Decreased appetite: yes Diarrhea: no Constipation: no Blood in stool: no Heartburn: yes Jaundice: no Rash: no Dysuria/urinary frequency:  no Hematuria: no History of sexually transmitted disease: no Recurrent NSAID use: no  Depression screen [facility] 2/9 03/25/2021 12/18/2020 11/19/2020 11/06/2020 07/06/2020  Decreased Interest 3 0 0 2 0  Down, Depressed, Hopeless 0 0 0 0 0  PHQ - 2 Score 3 0 0 2 0  Altered sleeping 0 0 2 0 0  Tired, decreased energy 0 0 1 0 0  Change in appetite 2 0 3 2 0  Feeling bad or failure about yourself  0 0 0 2 0  Trouble concentrating 0 0 0 0 0  Moving slowly or fidgety/restless 3 0 0 2 0  Suicidal thoughts 0 - - - 0  PHQ-9 Score 8 0 6 8 0  Difficult doing work/chores Very difficult - - - -  Some recent data might be hidden    HYPERTENSION / HYPERLIPIDEMIA Continues on Lisinopril 5 MG daily and Atorvastatin 10 MG daily.  Taking Evista for Osteoporosis and no recent DEXA noted in Epic, patient reports no transportation to attend this, although CCM has been working to assist. Satisfied with current treatment? yes Duration of hypertension: chronic BP monitoring frequency: not checking BP range:  BP medication side effects: no Duration of hyperlipidemia: chronic Cholesterol medication side effects: no Cholesterol supplements: none Medication compliance: fair compliance Aspirin: no Recent stressors: no Recurrent headaches: no Visual  changes: no Palpitations: no Dyspnea: no Chest pain: no Lower extremity edema: no Dizzy/lightheaded: no   CHRONIC KIDNEY DISEASE Last saw nephrology on 12/25/20.  Reports she has tried to get in touch with kidney doctor, but can not get in touch with them. CKD status: stable Medications renally dose: yes Previous renal evaluation: yes Pneumovax:  Up to Date Influenza Vaccine:  Up to Date   COPD Past smoker -- last lung screening in 2018 noted mild centrilobular emphysema.  No current inhalers. COPD status: stable Satisfied with current treatment?: yes Oxygen use: no Dyspnea frequency: none Cough frequency: none Rescue inhaler frequency:  none Limitation of  activity: no Productive cough: none Last Spirometry: unknown --refuses today Pneumovax: Up to Date Influenza: Up to Date   Relevant past medical, surgical, family and social history reviewed and updated as indicated. Interim medical history since our last visit reviewed. Allergies and medications reviewed and updated.  Review of Systems  Constitutional:  Negative for activity change, appetite change (improved), diaphoresis, fatigue and fever.  Respiratory:  Negative for cough, chest tightness, shortness of breath and wheezing.   Cardiovascular:  Negative for chest pain, palpitations and leg swelling.  Gastrointestinal:  Negative for abdominal distention, abdominal pain, blood in stool, constipation, diarrhea, nausea and vomiting.  Neurological: Negative.   Psychiatric/Behavioral:  Negative for decreased concentration, self-injury, sleep disturbance and suicidal ideas. The patient is not nervous/anxious.    Per HPI unless specifically indicated above    Objective:    BP 129/83   Pulse 64   Temp 98.4 F (36.9 C)   Ht 4' 11.5" (1.511 m)   Wt 132 lb 4 oz (60 kg)   LMP  (LMP Unknown)   SpO2 100%   BMI 26.26 kg/m   Wt Readings from Last 3 Encounters:  08/27/21 132 lb 4 oz (60 kg)  03/25/21 125 lb 9.6 oz (57 kg)  12/18/20 119 lb 12.8 oz (54.3 kg)    Physical Exam Vitals and nursing note reviewed.  Constitutional:      General: She is awake. She is not in acute distress.    Appearance: She is well-developed and well-groomed. She is not ill-appearing.  HENT:     Head: Normocephalic.     Right Ear: Hearing normal.     Left Ear: Hearing normal.     Nose: Nose normal.     Mouth/Throat:     Mouth: Mucous membranes are moist.  Eyes:     General: Lids are normal.        Right eye: No discharge.        Left eye: No discharge.     Conjunctiva/sclera: Conjunctivae normal.     Pupils: Pupils are equal, round, and reactive to light.  Neck:     Thyroid: No thyromegaly.      Vascular: No carotid bruit.  Cardiovascular:     Rate and Rhythm: Normal rate and regular rhythm.     Heart sounds: Normal heart sounds. No murmur heard.   No gallop.  Pulmonary:     Effort: Pulmonary effort is normal. No accessory muscle usage or respiratory distress.     Breath sounds: Normal breath sounds.  Abdominal:     General: Bowel sounds are normal. There is no distension or abdominal bruit.     Palpations: Abdomen is soft. There is no hepatomegaly or splenomegaly.     Tenderness: There is no abdominal tenderness.     Hernia: No hernia is present.  Musculoskeletal:  Cervical back: Normal range of motion and neck supple.     Right lower leg: No edema.     Left lower leg: No edema.  Lymphadenopathy:     Head:     Right side of head: No submental, submandibular, tonsillar, preauricular or posterior auricular adenopathy.     Left side of head: No submental, submandibular, tonsillar, preauricular or posterior auricular adenopathy.     Cervical: No cervical adenopathy.  Skin:    General: Skin is warm and dry.  Neurological:     Mental Status: She is alert and oriented to person, place, and time.  Psychiatric:        Attention and Perception: Attention normal.        Mood and Affect: Mood normal.        Speech: Speech normal.        Behavior: Behavior normal. Behavior is cooperative.   Results for orders placed or performed in visit on 03/25/21  Microscopic Examination   Urine  Result Value Ref Range   WBC, UA None seen 0 - 5 /hpf   RBC 3-10 (A) 0 - 2 /hpf   Epithelial Cells (non renal) 0-10 0 - 10 /hpf   Bacteria, UA None seen None seen/Few  Urinalysis, Routine w reflex microscopic  Result Value Ref Range   Specific Gravity, UA <1.005 (L) 1.005 - 1.030   pH, UA 5.5 5.0 - 7.5   Color, UA Yellow Yellow   Appearance Ur Cloudy (A) Clear   Leukocytes,UA Negative Negative   Protein,UA Negative Negative/Trace   Glucose, UA Negative Negative   Ketones, UA Negative  Negative   RBC, UA 3+ (A) Negative   Bilirubin, UA Negative Negative   Urobilinogen, Ur 0.2 0.2 - 1.0 mg/dL   Nitrite, UA Negative Negative   Microscopic Examination See below:   CBC with Differential/Platelet  Result Value Ref Range   WBC 4.8 3.4 - 10.8 x10E3/uL   RBC 4.00 3.77 - 5.28 x10E6/uL   Hemoglobin 11.7 11.1 - 15.9 g/dL   Hematocrit 36.4 34.0 - 46.6 %   MCV 91 79 - 97 fL   MCH 29.3 26.6 - 33.0 pg   MCHC 32.1 31.5 - 35.7 g/dL   RDW 14.4 11.7 - 15.4 %   Platelets 85 (LL) 150 - 450 x10E3/uL   Neutrophils 36 Not Estab. %   Lymphs 48 Not Estab. %   Monocytes 9 Not Estab. %   Eos 5 Not Estab. %   Basos 1 Not Estab. %   Neutrophils Absolute 1.7 1.4 - 7.0 x10E3/uL   Lymphocytes Absolute 2.4 0.7 - 3.1 x10E3/uL   Monocytes Absolute 0.4 0.1 - 0.9 x10E3/uL   EOS (ABSOLUTE) 0.2 0.0 - 0.4 x10E3/uL   Basophils Absolute 0.1 0.0 - 0.2 x10E3/uL   Immature Granulocytes 1 Not Estab. %   Immature Grans (Abs) 0.0 0.0 - 0.1 x10E3/uL   Hematology Comments: Note:   Comprehensive metabolic panel  Result Value Ref Range   Glucose 70 65 - 99 mg/dL   BUN 11 8 - 27 mg/dL   Creatinine, Ser 1.73 (H) 0.57 - 1.00 mg/dL   eGFR 31 (L) >59 mL/min/1.73   BUN/Creatinine Ratio 6 (L) 12 - 28   Sodium 135 134 - 144 mmol/L   Potassium 4.4 3.5 - 5.2 mmol/L   Chloride 102 96 - 106 mmol/L   CO2 18 (L) 20 - 29 mmol/L   Calcium 8.7 8.7 - 10.3 mg/dL   Total Protein 6.7 6.0 -  8.5 g/dL   Albumin 4.1 3.7 - 4.7 g/dL   Globulin, Total 2.6 1.5 - 4.5 g/dL   Albumin/Globulin Ratio 1.6 1.2 - 2.2   Bilirubin Total <0.2 0.0 - 1.2 mg/dL   Alkaline Phosphatase 71 44 - 121 IU/L   AST 14 0 - 40 IU/L   ALT 5 0 - 32 IU/L  Lipid Panel w/o Chol/HDL Ratio  Result Value Ref Range   Cholesterol, Total 171 100 - 199 mg/dL   Triglycerides 89 0 - 149 mg/dL   HDL 49 >39 mg/dL   VLDL Cholesterol Cal 17 5 - 40 mg/dL   LDL Chol Calc (NIH) 105 (H) 0 - 99 mg/dL  TSH  Result Value Ref Range   TSH 0.580 0.450 - 4.500 uIU/mL   VITAMIN D 25 Hydroxy (Vit-D Deficiency, Fractures)  Result Value Ref Range   Vit D, 25-Hydroxy 39.7 30.0 - 100.0 ng/mL  Protein Electrophoresis, (serum)  Result Value Ref Range   Albumin ELP 3.8 2.9 - 4.4 g/dL   Alpha 1 0.2 0.0 - 0.4 g/dL   Alpha 2 0.6 0.4 - 1.0 g/dL   Beta 0.8 0.7 - 1.3 g/dL   Gamma Globulin 1.3 0.4 - 1.8 g/dL   M-Spike, % 0.6 (H) Not Observed g/dL   Globulin, Total 2.9 2.2 - 3.9 g/dL   A/G Ratio 1.3 0.7 - 1.7   Please Note: Comment    Interpretation: Comment   IgG, IgA, IgM  Result Value Ref Range   IgG (Immunoglobin G), Serum 1,384 586 - 1,602 mg/dL   IgA/Immunoglobulin A, Serum 132 64 - 422 mg/dL   IgM (Immunoglobulin M), Srm 116 26 - 217 mg/dL  Kappa/lambda light chains  Result Value Ref Range   Ig Kappa Free Light Chain 104.7 (H) 3.3 - 19.4 mg/L   Ig Lambda Free Light Chain 55.1 (H) 5.7 - 26.3 mg/L   KAPPA/LAMBDA RATIO 1.90 (H) 0.26 - 1.65      Assessment & Plan:   Problem List Items Addressed This Visit       Cardiovascular and Mediastinum   Hypertension    Chronic, ongoing with BP at goal today for age. Continue current medication regimen and adjust as needed.  Recommend she check BP at home at least 3 mornings a week and document.  DASH diet focus.  If ongoing lower reading next visit, consider discontinuation or reduction to 2.5 MG of Lisinopril.  LABS: CMP, CBC, urine check.  Return in 6 months.        Respiratory   Centrilobular emphysema ([place])    Noted on CT 02/03/2017.  Is a former smoker, reports quitting 4 years ago.  No current inhalers.  Would benefit from spirometry in future, refuses today.  Has repeat lung CT ordered, but has not attended or scheduled this yet -- highly recommend she scheduled this as soon as possible -- she missed recent schedule scan.  Will continue to work closely with CCM team on assist.        Genitourinary   CKD (chronic kidney disease) stage 3, GFR 30-59 ml/min (HCC)    Chronic, ongoing.  Continue current  medication regimen and collaboration with nephrology.  CCM collaboration continues.  Obtain CMP and CBC + PTH and urine ALB today.  Highly recommend she get scheduled with nephrology for ongoing continuity of care.      Relevant Orders   Comprehensive metabolic panel   PTH, Intact and Calcium   Microalbumin, Urine Waived     Hematopoietic  and Hemostatic   Idiopathic thrombocytopenic purpura ([place]) (Chronic)    Continue to collaborate with hematology, recent note reviewed.  She denies bleeding or increased bruising.  Have highly recommended not to miss appointments with them due to disease process and ongoing continuity of care.        Other   Schizophrenia ([place]) - Primary    Chronic, ongoing.  Continue current medication regimen, Risperdal, and adjust as needed.  Has been on this regimen for years with benefit.  Does have underlying paranoia at baseline.  Refills up to date.      Hyperlipidemia   Relevant Orders   Comprehensive metabolic panel   Lipid Panel w/o Chol/HDL Ratio   MGUS (monoclonal gammopathy of unknown significance)    Continue to collaborate with hematology, recent note reviewed.  She denies bleeding or increased bruising.       Relevant Orders   CBC with Differential/Platelet   Other Visit Diagnoses     Encounter for completion of form with patient       Form complete for home health aide today and will have staff fax.   Flu vaccine need       Flu vaccine today   Relevant Orders   Flu Vaccine QUAD High Dose(Fluad) (Completed)        Follow up plan: Return in about 6 months (around 02/25/2022) for CKD, MOOD, HTN/HLD, COPD, MGUS.

## 2021-08-27 NOTE — Assessment & Plan Note (Signed)
Continue to collaborate with hematology, recent note reviewed.  She denies bleeding or increased bruising.  Have highly recommended not to miss appointments with them due to disease process and ongoing continuity of care. 

## 2021-08-27 NOTE — Assessment & Plan Note (Signed)
Chronic, ongoing.  Continue current medication regimen, Risperdal, and adjust as needed.  Has been on this regimen for years with benefit.  Does have underlying paranoia at baseline.  Refills up to date.

## 2021-08-27 NOTE — Assessment & Plan Note (Signed)
Noted on CT 02/03/2017.  Is a former smoker, reports quitting 4 years ago.  No current inhalers.  Would benefit from spirometry in future, refuses today.  Has repeat lung CT ordered, but has not attended or scheduled this yet -- highly recommend she scheduled this as soon as possible -- she missed recent schedule scan.  Will continue to work closely with CCM team on assist.

## 2021-08-28 LAB — CBC WITH DIFFERENTIAL/PLATELET
Basophils Absolute: 0 10*3/uL (ref 0.0–0.2)
Basos: 1 %
EOS (ABSOLUTE): 0.2 10*3/uL (ref 0.0–0.4)
Eos: 3 %
Hematocrit: 35.1 % (ref 34.0–46.6)
Hemoglobin: 11.5 g/dL (ref 11.1–15.9)
Immature Grans (Abs): 0 10*3/uL (ref 0.0–0.1)
Immature Granulocytes: 0 %
Lymphocytes Absolute: 1.6 10*3/uL (ref 0.7–3.1)
Lymphs: 32 %
MCH: 30.1 pg (ref 26.6–33.0)
MCHC: 32.8 g/dL (ref 31.5–35.7)
MCV: 92 fL (ref 79–97)
Monocytes Absolute: 0.4 10*3/uL (ref 0.1–0.9)
Monocytes: 9 %
Neutrophils Absolute: 2.8 10*3/uL (ref 1.4–7.0)
Neutrophils: 55 %
Platelets: 53 10*3/uL — CL (ref 150–450)
RBC: 3.82 x10E6/uL (ref 3.77–5.28)
RDW: 13.7 % (ref 11.7–15.4)
WBC: 5 10*3/uL (ref 3.4–10.8)

## 2021-08-28 LAB — COMPREHENSIVE METABOLIC PANEL
ALT: 4 IU/L (ref 0–32)
AST: 10 IU/L (ref 0–40)
Albumin/Globulin Ratio: 1.8 (ref 1.2–2.2)
Albumin: 4.3 g/dL (ref 3.7–4.7)
Alkaline Phosphatase: 62 IU/L (ref 44–121)
BUN/Creatinine Ratio: 6 — ABNORMAL LOW (ref 12–28)
BUN: 10 mg/dL (ref 8–27)
Bilirubin Total: 0.5 mg/dL (ref 0.0–1.2)
CO2: 22 mmol/L (ref 20–29)
Calcium: 9.1 mg/dL (ref 8.7–10.3)
Chloride: 102 mmol/L (ref 96–106)
Creatinine, Ser: 1.59 mg/dL — ABNORMAL HIGH (ref 0.57–1.00)
Globulin, Total: 2.4 g/dL (ref 1.5–4.5)
Glucose: 77 mg/dL (ref 70–99)
Potassium: 4 mmol/L (ref 3.5–5.2)
Sodium: 141 mmol/L (ref 134–144)
Total Protein: 6.7 g/dL (ref 6.0–8.5)
eGFR: 34 mL/min/{1.73_m2} — ABNORMAL LOW (ref >59)

## 2021-08-28 LAB — PTH, INTACT AND CALCIUM: PTH: 24 pg/mL (ref 15–65)

## 2021-08-28 LAB — LIPID PANEL W/O CHOL/HDL RATIO
Cholesterol, Total: 179 mg/dL (ref 100–199)
HDL: 52 mg/dL (ref >39)
LDL Chol Calc (NIH): 103 mg/dL — ABNORMAL HIGH (ref 0–99)
Triglycerides: 139 mg/dL (ref 0–149)
VLDL Cholesterol Cal: 24 mg/dL (ref 5–40)

## 2021-08-28 NOTE — Progress Notes (Signed)
I am happy to see her whenever she can come. I have got a pretty light schedule this week.   Faythe Casa, NP 08/28/2021 11:57 AM

## 2021-08-28 NOTE — Progress Notes (Signed)
Good morning everyone!!   - Nursing crew, please let patient know her labs have returned.  Kidney function continues to show stable kidney disease and I want her to ensure she continues to follow-up with her kidney doctor.  Her CBC shows decline in platelet count to 53 -- I would like her to get in to see hematology for follow-up ASAP, this is very important she keep these visits and not miss them -- we can assist her with transportation if needed.  I am alerting her hematology provider of these results, as well as our chronic care team to help get her in to see them ASAP.

## 2021-08-28 NOTE — Progress Notes (Signed)
-   Denise Everett and chronic care team -- Denise Everett just alerting you to her lowering platelet count, she is difficult to get into appointments consistently and we have been working on this at length with her.  I would like her to get scheduled for follow-up at your clinic please.  Pam and Delana Meyer can you assist with this and transportation -- she always reports transportation is an issue.  I am struggling with how to get her to attend visits and follow-up, which are extremely important for her.  She has missed many imaging appointments and colonoscopy over past year.

## 2021-09-24 ENCOUNTER — Ambulatory Visit (INDEPENDENT_AMBULATORY_CARE_PROVIDER_SITE_OTHER): Payer: Medicare Other

## 2021-09-24 ENCOUNTER — Telehealth: Payer: Medicare Other | Admitting: General Practice

## 2021-09-24 DIAGNOSIS — D472 Monoclonal gammopathy: Secondary | ICD-10-CM

## 2021-09-24 DIAGNOSIS — I10 Essential (primary) hypertension: Secondary | ICD-10-CM

## 2021-09-24 DIAGNOSIS — E7800 Pure hypercholesterolemia, unspecified: Secondary | ICD-10-CM

## 2021-09-24 DIAGNOSIS — F209 Schizophrenia, unspecified: Secondary | ICD-10-CM

## 2021-09-24 DIAGNOSIS — N1832 Chronic kidney disease, stage 3b: Secondary | ICD-10-CM

## 2021-09-24 NOTE — Chronic Care Management (AMB) (Signed)
Chronic Care Management   CCM RN Visit Note  09/24/2021 Name: Denise Everett MRN: [id number] DOB: 11-20-1950  Subjective: Denise Everett is a 70 y.o. year old female who is a primary care patient of Cannady, Barbaraann Faster, NP. The care management team was consulted for assistance with disease management and care coordination needs.    Engaged with patient by telephone for follow up visit in response to provider referral for case management and/or care coordination services.   Consent to Services:  The patient was given information about Chronic Care Management services, agreed to services, and gave verbal consent prior to initiation of services.  Please see initial visit note for detailed documentation.   Patient agreed to services and verbal consent obtained.   Assessment: Review of patient past medical history, allergies, medications, health status, including review of consultants reports, laboratory and other test data, was performed as part of comprehensive evaluation and provision of chronic care management services.   SDOH (Social Determinants of Health) assessments and interventions performed:    CCM Care Plan  Allergies  Allergen Reactions   No Known Allergies     Outpatient Encounter Medications as of 09/24/2021  Medication Sig   atorvastatin (LIPITOR) 10 MG tablet TAKE 1 TABLET BY MOUTH DAILY AT 6 PM   diclofenac Sodium (VOLTAREN) 1 % GEL Apply 4 g topically 4 (four) times daily.   famotidine (PEPCID) 20 MG tablet Take 1 tablet (20 mg total) by mouth daily.   gabapentin (NEURONTIN) 300 MG capsule Take 1 capsule (300 mg total) by mouth 2 (two) times daily.   lisinopril (ZESTRIL) 5 MG tablet Take 1 tablet (5 mg total) by mouth daily.   mirtazapine (REMERON) 15 MG tablet Take 0.5 tablets (7.5 mg total) by mouth at bedtime.   ondansetron (ZOFRAN ODT) 4 MG disintegrating tablet Take 1 tablet (4 mg total) by mouth every 8 (eight) hours as needed for nausea or vomiting.    raloxifene (EVISTA) 60 MG tablet Take 1 tablet (60 mg total) by mouth daily.   risperiDONE (RISPERDAL) 2 MG tablet Take 0.5-1 tablets (1-2 mg total) by mouth at bedtime.   tiZANidine (ZANAFLEX) 4 MG tablet Take 1 tablet (4 mg total) by mouth every 6 (six) hours as needed for muscle spasms.   No facility-administered encounter medications on file as of 09/24/2021.    Patient Active Problem List   Diagnosis Date Noted   Hematuria 03/25/2021   Aortic atherosclerosis ([place]) 10/20/2020   Centrilobular emphysema ([place]) 10/20/2020   CKD (chronic kidney disease) stage 3, GFR 30-59 ml/min ([facility]) 02/29/2020   Osteoarthritis of lumbar spine 04/15/2017   Lumbar facet arthropathy 04/15/2017   Lumbar facet syndrome (Bilateral) (R>L) 04/15/2017   Vitamin D insufficiency 03/18/2017   Marijuana use 03/03/2017   Chronic pain syndrome 02/24/2017   Chronic ankle pain  (Location of Tertiary source of pain) (Bilateral) (R>L) 02/24/2017   Abnormal MRI, lumbar spine (06/22/2014) 02/24/2017   Lumbar central spinal stenosis (Severe at L3-4) 02/24/2017   Lumbar foraminal stenosis (Severe: Left L3-4; Right L4-5; Bilateral L5-S1) 02/24/2017   Lumbar lateral recess stenosis (Severe: Left L3-4; Right L4-5; Left L5-S1) 02/24/2017   Personal history of tobacco use, presenting hazards to health 02/03/2017   Advance care planning 10/08/2016   MGUS (monoclonal gammopathy of unknown significance) 06/23/2016   Schizophrenia ([place]) 06/07/2015   Osteoporosis 06/07/2015   Chronic low back pain (Location of Primary Source of Pain) (Bilateral) (R>L) 06/07/2015   Idiopathic thrombocytopenic purpura ([place]) 06/07/2015  Allergic rhinitis 06/07/2015   Chronic leg pain 06/07/2015   Hyperlipidemia 06/07/2015   Hypertension 06/07/2015   Lumbar IVDD (intervertebral disc displacement) 09/29/2014   Neuritis or radiculitis due to rupture of lumbar intervertebral disc 09/29/2014   Chronic Lumbar radiculitis 09/29/2014   Lumbar HNP  (herniated nucleus pulposus) 09/29/2014    Conditions to be addressed/monitored:HTN, HLD, CKD Stage 3, and Schizophrenia and MGUS  Care Plan : RNCM: Chronic Kidney (Adult)  Updates made by Vanita Ingles, RN since 09/24/2021 12:00 AM  Completed 09/24/2021   Problem: RNCM: Adjustment to Chronic Kidney Disease Resolved 09/24/2021  Priority: Medium     Long-Range Goal: RNCM: CKD Completed 09/24/2021  Start Date: 12/26/2020  Expected End Date: 04/29/2022  Recent Progress: On track  Priority: Medium  Note:   Current Barriers: Resolving duplicate goal Knowledge Deficits related to taking sodium bicarbonate as prescribed by the provider and the need to take medications as prescribed for kidney health  Chronic Disease Management support and education needs related to effective management of CKD3 Lacks caregiver support.  Unable to independently manage CKD Unable to self administer medications as prescribed Does not attend all scheduled provider appointments Does not adhere to prescribed medication regimen Lacks social connections Does not contact provider office for questions/concerns  Nurse Case Manager Clinical Goal(s):  patient will verbalize understanding of plan for effective management of CKD3 patient will work with RNCM, pcp, and specialist  to address needs related to CKD patient will attend all scheduled medical appointments: 09-25-2021 at 120 pm patient will demonstrate improved adherence to prescribed treatment plan for CKD as evidenced bycompliance with medications regimen and working with the CCM team to manage health and well being.   Interventions:  1:1 collaboration with Venita Lick, NP regarding development and update of comprehensive plan of care as evidenced by provider attestation and co-signature Inter-disciplinary care team collaboration (see longitudinal plan of care) Evaluation of current treatment plan related to CKD stage 3 and patient's adherence to plan  as established by provider. Advised patient to call the office for changes or questions  Provided education to patient re: taking medications as prescribed, wt loss, safety Reviewed medications with patient and discussed compliance. The patient states that she was supposed to be taking Sodium Bicarb but did not get it from pharmacy. The patient has been instructed to take 1300 mg BID. Education provided and the patient states that she is supposed to get the medication from the pharmacy today. 07-18-2021: The patient states that she is taking her medications as directed however it is uncertain if she is due to long standing history of non-compliance. Encouraged the patient to keep appointments and take medications as prescribed.  Reviewed scheduled/upcoming provider appointments including: 09-25-2021 at 1:20 pm Discussed plans with patient for ongoing care management follow up and provided patient with direct contact information for care management team  Patient Goals/Self-Care Activities patient will:  - Patient will self administer medications as prescribed Patient will attend all scheduled provider appointments Patient will call pharmacy for medication refills Patient will attend church or other social activities Patient will continue to perform ADL's independently Patient will continue to perform IADL's independently Patient will call provider office for new concerns or questions Patient will work with BSW to address care coordination needs and will continue to work with the clinical team to address health care and disease management related needs.   - counseling provided - decision-making supported - depression screen reviewed - family care conference arranged - family  involvement promoted - goal setting facilitated - self-care encouraged - self-reflection promoted - verbalization of feelings encouraged  Follow Up Plan: Telephone follow up appointment with care management team member  scheduled for: 09-24-2021 at 48 am        Care Plan : RNCM: Hypertension (Adult)  Updates made by Vanita Ingles, RN since 09/24/2021 12:00 AM  Completed 09/24/2021   Problem: RNCM: Hypertension (Hypertension) Resolved 09/24/2021  Priority: Medium     Long-Range Goal: RNCM: Hypertension Monitored Completed 09/24/2021  Start Date: 12/26/2020  Expected End Date: 04/29/2022  Recent Progress: On track  Priority: Medium  Note:   Objective: Resolving, duplicate goal  Last practice recorded BP readings:  BP Readings from Last 3 Encounters:  03/25/21 128/82  12/18/20 116/79  11/06/20 99/63  Elevated blood pressure at kidney specialist on Tuesday 12-25-2020 Most recent eGFR/CrCl: No results found for: EGFR  No components found for: CRCL Current Barriers:  Knowledge Deficits related to basic understanding of hypertension pathophysiology and self care management Knowledge Deficits related to understanding of medications prescribed for management of hypertension Non-adherence to prescribed medication regimen Non-adherence to scheduled provider appointments Limited Social Support Unable to independently manage HTN Does not attend all scheduled provider appointments Does not adhere to prescribed medication regimen Lacks social connections Does not contact provider office for questions/concerns Case Manager Clinical Goal(s):  patient will verbalize understanding of plan for hypertension management patient will attend all scheduled medical appointments: 09-25-2021   patient will demonstrate improved adherence to prescribed treatment plan for hypertension as evidenced by taking all medications as prescribed, monitoring and recording blood pressure as directed, adhering to low sodium/DASH diet  patient will demonstrate improved health management independence as evidenced by checking blood pressure as directed and notifying PCP if SBP>160 or DBP > 90, taking all medications as prescribe, and  adhering to a low sodium diet as discussed. patient will verbalize basic understanding of hypertension disease process and self health management plan as evidenced by taking medications as directed, following a heart health diet, working with the CCM team to manage health and well being.  Interventions:  Collaboration with Venita Lick, NP regarding development and update of comprehensive plan of care as evidenced by provider attestation and co-signature Inter-disciplinary care team collaboration (see longitudinal plan of care) Evaluation of current treatment plan related to hypertension self management and patient's adherence to plan as established by provider. 07-19-2021: The patient states she is doing well. She states she did not have her colonoscopy due to transportation that it "fell through". The patient states that she missed some of her other appointments due to misplacing her papers and not finding out until it was too late to call the "wagon". The patient denies any acute findings and says she is sleeping good, eating good, and actually has gained some weight. Will continue to monitor. Encouraged the patient to reschedule appointments for follow up.  Provided education to patient re: stroke prevention, s/s of heart attack and stroke, DASH diet, complications of uncontrolled blood pressure Reviewed medications with patient and discussed importance of compliance. 07-19-2021: States she is compliant with medications.  Discussed plans with patient for ongoing care management follow up and provided patient with direct contact information for care management team Advised patient, providing education and rationale, to monitor blood pressure daily and record, calling PCP for findings outside established parameters.  Reviewed scheduled/upcoming provider appointments including: 09-25-2021 Patient Goals/Self-Care Activities  patient will:  - Self administers medications as prescribed Attends all  scheduled provider appointments Calls provider office for new concerns, questions, or BP outside discussed parameters Checks BP and records as discussed Follows a low sodium diet/DASH diet - depression screen reviewed - home or ambulatory blood pressure monitoring encouraged Follow Up Plan: Telephone follow up appointment with care management team member scheduled for: 09-24-2021 at 54 am    Care Plan : RNCM: MGUS and increased platelets  Updates made by Vanita Ingles, RN since 09/24/2021 12:00 AM  Completed 09/24/2021   Problem: RNCM: Management of MGUS and increased platelet count Resolved 09/24/2021  Priority: High     Long-Range Goal: RNCM: Increased platelets and MGUS Completed 09/24/2021  Start Date: 07/19/2021  Expected End Date: 07/19/2022  Recent Progress: Not on track  Priority: High  Note:   Current Barriers: Resolving, duplicate goal Ineffective Self Health Maintenance in a patient with  MGUS and increased platelet count Unable to independently manage MGUS and increased platelet count due to noncompliance and missed specialist appointments Unable to self administer medications as prescribed Does not attend all scheduled provider appointments Does not adhere to prescribed medication regimen Lacks social connections Does not maintain contact with provider office Does not contact provider office for questions/concerns No shows to appointments and non-compliance for treat plan established Clinical Goal(s):  Collaboration with Venita Lick, NP regarding development and update of comprehensive plan of care as evidenced by provider attestation and co-signature Inter-disciplinary care team collaboration (see longitudinal plan of care) patient will work with care management team to address care coordination and chronic disease management needs related to Mental Health Counseling Level of Care Concerns   Interventions:  Evaluation of current treatment plan related to   MGUS and increased platelet level , Transportation, Level of care concerns, and Mental Health Concerns  self-management and patient's adherence to plan as established by provider. Collaboration with Venita Lick, NP regarding development and update of comprehensive plan of care as evidenced by provider attestation       and co-signature Inter-disciplinary care team collaboration (see longitudinal plan of care) Discussed plans with patient for ongoing care management follow up and provided patient with direct contact information for care management team In basket message  (07-19-2021) sent to Dr. Gary Fleet staff asking for assistance with getting the patient a new follow up visit. Per the patient she has called and left messages for them to call her back and reschedule. Secure chat with staff from Dr. Gary Fleet office and they will reach out to the patient but state the patient has had several no show appointments and has not reached back out to the office. Will collaborate with staff to see if RNCM can assist with the patient keeping follow up appointments with specialist.  Self Care Activities:  Patient verbalizes understanding of plan to effectively manage MGUS and increased platelet count Self administers medications as prescribed Attends all scheduled provider appointments Calls pharmacy for medication refills Attends church or other social activities Performs ADL's independently Performs IADL's independently Calls provider office for new concerns or questions Call for adequate transportation to make all appointments for follow up with Dr. Grayland Ormond  Patient Goals: -Keep appointments to see Dr. Grayland Ormond, if cannot keep appointment call and reschedule -Arragne transportation in timely manner -Call the office for questions, concerns, or changes in conditions.  Follow Up Plan: Telephone follow up appointment with care management team member scheduled for: 09-24-2021 at 55 am     Care  Plan : RNCM: General Plan of Care (Adult) for Chronic Disease  Management and Care Coordination Needs  Updates made by Vanita Ingles, RN since 09/24/2021 12:00 AM     Problem: RNCM; Development of Plan of Care for Chronic Disease Management (HTN, HLD, CKD3, MGUS, and Schizophrenia)   Priority: High     Long-Range Goal: RNCM: Effective Management of Plan of Care for Chronic Disease Management (HTN, HLD, CKD3, MGUS, and Schizophrenia)   Start Date: 09/24/2021  Expected End Date: 09/24/2022  Priority: High  Note:   Current Barriers:  Knowledge Deficits related to plan of care for management of HTN, HLD, CKD Stage 3, and Schizophrenia and MGUS  Care Coordination needs related to Limited social support, Transportation, Mental Health Concerns , Social Isolation, and Literacy concerns  Chronic Disease Management support and education needs related to HTN, HLD, CKD Stage 3, and Shizophrenia and MGUS Lacks caregiver support.        Literacy barriers Transportation barriers Non-adherence to scheduled provider appointments Non-adherence to prescribed medication regimen  RNCM Clinical Goal(s):  Patient will verbalize basic understanding of HTN, HLD, CKD Stage Schizophrenia , and MGUS disease process and self health management plan as evidenced by keeping appointments, following plan of care and taking medications as directed  take all medications exactly as prescribed and will call provider for medication related questions as evidenced by compliance    attend all scheduled medical appointments: 10-17-2021 at 1 pm with oncology and 10-22-2021 at 130 pm with nephrology as evidenced by keeping appointments and calling if she cannot make the appointment         demonstrate improved and ongoing adherence to prescribed treatment plan for HTN, HLD, CKD Stage 3, and Schizophrenia and MGUS as evidenced by working with the CCM team to optimize plan of care for effective management of health and well being   continue to work with Consulting civil engineer and/or Social Worker to address care management and care coordination needs related to HTN, HLD, CKD Stage 3, and Schizophrenia and MGUS as evidenced by adherence to CM Team Scheduled appointments     demonstrate ongoing self health care management ability for effective management of chronic conditions  as evidenced by  working with the CCM team through collaboration with Consulting civil engineer, provider, and care team.   Interventions: 1:1 collaboration with primary care provider regarding development and update of comprehensive plan of care as evidenced by provider attestation and co-signature Inter-disciplinary care team collaboration (see longitudinal plan of care) Evaluation of current treatment plan related to  self management and patient's adherence to plan as established by provider   Chronic Kidney Disease (Status: Goal on Track (progressing): YES.)  Long Term Goal  Last practice recorded BP readings:  BP Readings from Last 3 Encounters:  08/27/21 129/83  03/25/21 128/82  12/18/20 116/79  Most recent eGFR/CrCl:  Lab Results  Component Value Date   EGFR 34 (L) 08/27/2021    No components found for: CRCL  Assessed the patient     understanding of chronic kidney disease    Evaluation of current treatment plan related to chronic kidney disease self management and patient's adherence to plan as established by provider      Provided education to patient re: stroke prevention, s/s of heart attack and stroke    Reviewed prescribed diet heart healthy/Renal Reviewed medications with patient and discussed importance of compliance    Counseled on adverse effects of illicit drug and excessive alcohol use in patients with chronic kidney disease    Discussed complications of poorly controlled blood  pressure such as heart disease, stroke, circulatory complications, vision complications, kidney impairment, sexual dysfunction    Reviewed scheduled/upcoming provider  appointments including: 10-22-2021 at 130 pm Advised patient to discuss changes in kidney function  with provider    Discussed plans with patient for ongoing care management follow up and provided patient with direct contact information for care management team    Screening for signs and symptoms of depression related to chronic disease state      Discussed the impact of chronic kidney disease on daily life and mental health and acknowledged and normalized feelings of disempowerment, fear, and frustration    Assessed social determinant of health barriers    Provided education on kidney disease progression    Engage patient in early, proactive and ongoing discussion about goals of care and what matters most to them    Support coping and stress management by recognizing current strategies and assist in developing new strategies such as mindfulness, journaling, relaxation techniques, problem-solving    Assisted the patient with making an appointment for follow up with the nephrologist on 10-22-2021 at 130 pm. The patient is going to arrange transportation with the "wagon". The patient able to tell RNCM her appointment and promised she would make it   MGUS- monoclonal gammopathy of unknown significance   (Status: Goal on Track (progressing): YES.) Long Term Goal  Evaluation of current treatment plan related to  MGUS , Limited social support, Transportation, and Mental Health Concerns  self-management and patient's adherence to plan as established by provider. Discussed plans with patient for ongoing care management follow up and provided patient with direct contact information for care management team Advised patient to keep appointments, and follow the recommendations of the provider ; Provided education to patient re: the importance of follow up with oncologist due to low platelet count of 53. The patient verbalized understanding of the importance of keeping her appointment ; Reviewed medications with  patient and discussed states compliance with medications ; Reviewed scheduled/upcoming provider appointments including 10-17-2021 at 1 pm. Assisted the patient with making the appointment. She will get transportation with the "wagon"; Discussed plans with patient for ongoing care management follow up and provided patient with direct contact information for care management team; Advised patient to discuss changes in platelets, and why they continue to decrease with provider; Screening for signs and symptoms of depression related to chronic disease state;  Assessed social determinant of health barriers;     Schizophrenia   (Status: Goal on Track (progressing): YES.) Long Term Goal  Evaluation of current treatment plan related to  Schizophrenia , Limited social support, Transportation, Level of care concerns, Mental Health Concerns , and Social Isolation self-management and patient's adherence to plan as established by provider. Discussed plans with patient for ongoing care management follow up and provided patient with direct contact information for care management team Advised patient to call the office for changes in mood, anxiety, or depression ; Provided education to patient re: monitoring for changes in mental health and regular follow ups with specialist and pcp ; Reviewed medications with patient and discussed compliance ; Reviewed scheduled/upcoming provider appointments including 02-25-2022 at 2 pm; Discussed plans with patient for ongoing care management follow up and provided patient with direct contact information for care management team; Advised patient to discuss new concerns or changes in mental health  with provider; Screening for signs and symptoms of depression related to chronic disease state;  Assessed social determinant of health barriers;   Hyperlipidemia:  (  Status: Goal on Track (progressing): YES.) Long Term Goal  Lab Results  Component Value Date   CHOL 179 08/27/2021    HDL 52 08/27/2021   LDLCALC 103 (H) 08/27/2021   TRIG 139 08/27/2021     Medication review performed; medication list updated in electronic medical record.  Provider established cholesterol goals reviewed; Counseled on importance of regular laboratory monitoring as prescribed; Provided HLD educational materials; Reviewed role and benefits of statin for ASCVD risk reduction; Discussed strategies to manage statin-induced myalgias; Reviewed importance of limiting foods high in cholesterol;  Hypertension: (Status: Goal on Track (progressing): YES.) Last practice recorded BP readings:  BP Readings from Last 3 Encounters:  08/27/21 129/83  03/25/21 128/82  12/18/20 116/79  Most recent eGFR/CrCl:  Lab Results  Component Value Date   EGFR 34 (L) 08/27/2021    No components found for: CRCL  Evaluation of current treatment plan related to hypertension self management and patient's adherence to plan as established by provider;   Provided education to patient re: stroke prevention, s/s of heart attack and stroke; Reviewed prescribed diet heart healthy/Renal  Reviewed medications with patient and discussed importance of compliance;  Discussed plans with patient for ongoing care management follow up and provided patient with direct contact information for care management team; Advised patient, providing education and rationale, to monitor blood pressure daily and record, calling PCP for findings outside established parameters;  Provided education on prescribed diet heart healthy/Renal ;  Discussed complications of poorly controlled blood pressure such as heart disease, stroke, circulatory complications, vision complications, kidney impairment, sexual dysfunction;   Patient Goals/Self-Care Activities: Patient will self administer medications as prescribed as evidenced by self report/primary caregiver report  Patient will attend all scheduled provider appointments as evidenced by clinician review  of documented attendance to scheduled appointments and patient/caregiver report Patient will call pharmacy for medication refills as evidenced by patient report and review of pharmacy fill history as appropriate Patient will attend church or other social activities as evidenced by patient report Patient will continue to perform ADL's independently as evidenced by patient/caregiver report Patient will continue to perform IADL's independently as evidenced by patient/caregiver report Patient will call provider office for new concerns or questions as evidenced by review of documented incoming telephone call notes and patient report Patient will work with BSW to address care coordination needs and will continue to work with the clinical team to address health care and disease management related needs as evidenced by documented adherence to scheduled care management/care coordination appointments - check blood pressure weekly - choose a place to take my blood pressure (home, clinic or office, retail store) - write blood pressure results in a log or diary - learn about high blood pressure - keep a blood pressure log - take blood pressure log to all doctor appointments - call doctor for signs and symptoms of high blood pressure - develop an action plan for high blood pressure - keep all doctor appointments - take medications for blood pressure exactly as prescribed - begin an exercise program - report new symptoms to your doctor - eat more whole grains, fruits and vegetables, lean meats and healthy fats - call for medicine refill 2 or 3 days before it runs out - take all medications exactly as prescribed - call doctor with any symptoms you believe are related to your medicine - call doctor when you experience any new symptoms - go to all doctor appointments as scheduled - adhere to prescribed diet: heart healthy diet  Plan:Telephone follow up appointment with care management team member  scheduled for:  11-26-2021 at 83 pm  Noreene Larsson RN, MSN, [facility] Mobile: [phone]

## 2021-09-24 NOTE — Patient Instructions (Signed)
Visit Information  Current Barriers:  Knowledge Deficits related to plan of care for management of HTN, HLD, CKD Stage 3, and Schizophrenia and MGUS  Care Coordination needs related to Limited social support, Transportation, Mental Health Concerns , Social Isolation, and Literacy concerns  Chronic Disease Management support and education needs related to HTN, HLD, CKD Stage 3, and Shizophrenia and MGUS Lacks caregiver support.        Literacy barriers Transportation barriers Non-adherence to scheduled provider appointments Non-adherence to prescribed medication regimen  RNCM Clinical Goal(s):  Patient will verbalize basic understanding of HTN, HLD, CKD Stage Schizophrenia , and MGUS disease process and self health management plan as evidenced by keeping appointments, following plan of care and taking medications as directed  take all medications exactly as prescribed and will call provider for medication related questions as evidenced by compliance    attend all scheduled medical appointments: 10-17-2021 at 1 pm with oncology and 10-22-2021 at 130 pm with nephrology as evidenced by keeping appointments and calling if she cannot make the appointment         demonstrate improved and ongoing adherence to prescribed treatment plan for HTN, HLD, CKD Stage 3, and Schizophrenia and MGUS as evidenced by working with the CCM team to optimize plan of care for effective management of health and well being  continue to work with Consulting civil engineer and/or Social Worker to address care management and care coordination needs related to HTN, HLD, CKD Stage 3, and Schizophrenia and MGUS as evidenced by adherence to CM Team Scheduled appointments     demonstrate ongoing self health care management ability for effective management of chronic conditions  as evidenced by  working with the CCM team through collaboration with Consulting civil engineer, provider, and care team.   Interventions: 1:1 collaboration with primary care  provider regarding development and update of comprehensive plan of care as evidenced by provider attestation and co-signature Inter-disciplinary care team collaboration (see longitudinal plan of care) Evaluation of current treatment plan related to  self management and patient's adherence to plan as established by provider   Chronic Kidney Disease (Status: Goal on Track (progressing): YES.)  Long Term Goal  Last practice recorded BP readings:  BP Readings from Last 3 Encounters:  08/27/21 129/83  03/25/21 128/82  12/18/20 116/79  Most recent eGFR/CrCl:  Lab Results  Component Value Date   EGFR 34 (L) 08/27/2021    No components found for: CRCL  Assessed the patient     understanding of chronic kidney disease    Evaluation of current treatment plan related to chronic kidney disease self management and patient's adherence to plan as established by provider      Provided education to patient re: stroke prevention, s/s of heart attack and stroke    Reviewed prescribed diet heart healthy/Renal Reviewed medications with patient and discussed importance of compliance    Counseled on adverse effects of illicit drug and excessive alcohol use in patients with chronic kidney disease    Discussed complications of poorly controlled blood pressure such as heart disease, stroke, circulatory complications, vision complications, kidney impairment, sexual dysfunction    Reviewed scheduled/upcoming provider appointments including: 10-22-2021 at 130 pm Advised patient to discuss changes in kidney function  with provider    Discussed plans with patient for ongoing care management follow up and provided patient with direct contact information for care management team    Screening for signs and symptoms of depression related to chronic disease state  Discussed the impact of chronic kidney disease on daily life and mental health and acknowledged and normalized feelings of disempowerment, fear, and  frustration    Assessed social determinant of health barriers    Provided education on kidney disease progression    Engage patient in early, proactive and ongoing discussion about goals of care and what matters most to them    Support coping and stress management by recognizing current strategies and assist in developing new strategies such as mindfulness, journaling, relaxation techniques, problem-solving    Assisted the patient with making an appointment for follow up with the nephrologist on 10-22-2021 at 130 pm. The patient is going to arrange transportation with the "wagon". The patient able to tell RNCM her appointment and promised she would make it   MGUS- monoclonal gammopathy of unknown significance   (Status: Goal on Track (progressing): YES.) Long Term Goal  Evaluation of current treatment plan related to  MGUS , Limited social support, Transportation, and Mental Health Concerns  self-management and patient's adherence to plan as established by provider. Discussed plans with patient for ongoing care management follow up and provided patient with direct contact information for care management team Advised patient to keep appointments, and follow the recommendations of the provider ; Provided education to patient re: the importance of follow up with oncologist due to low platelet count of 53. The patient verbalized understanding of the importance of keeping her appointment ; Reviewed medications with patient and discussed states compliance with medications ; Reviewed scheduled/upcoming provider appointments including 10-17-2021 at 1 pm. Assisted the patient with making the appointment. She will get transportation with the "wagon"; Discussed plans with patient for ongoing care management follow up and provided patient with direct contact information for care management team; Advised patient to discuss changes in platelets, and why they continue to decrease with provider; Screening for signs  and symptoms of depression related to chronic disease state;  Assessed social determinant of health barriers;     Schizophrenia   (Status: Goal on Track (progressing): YES.) Long Term Goal  Evaluation of current treatment plan related to  Schizophrenia , Limited social support, Transportation, Level of care concerns, Mental Health Concerns , and Social Isolation self-management and patient's adherence to plan as established by provider. Discussed plans with patient for ongoing care management follow up and provided patient with direct contact information for care management team Advised patient to call the office for changes in mood, anxiety, or depression ; Provided education to patient re: monitoring for changes in mental health and regular follow ups with specialist and pcp ; Reviewed medications with patient and discussed compliance ; Reviewed scheduled/upcoming provider appointments including 02-25-2022 at 2 pm; Discussed plans with patient for ongoing care management follow up and provided patient with direct contact information for care management team; Advised patient to discuss new concerns or changes in mental health  with provider; Screening for signs and symptoms of depression related to chronic disease state;  Assessed social determinant of health barriers;   Hyperlipidemia:  (Status: Goal on Track (progressing): YES.) Long Term Goal  Lab Results  Component Value Date   CHOL 179 08/27/2021   HDL 52 08/27/2021   LDLCALC 103 (H) 08/27/2021   TRIG 139 08/27/2021     Medication review performed; medication list updated in electronic medical record.  Provider established cholesterol goals reviewed; Counseled on importance of regular laboratory monitoring as prescribed; Provided HLD educational materials; Reviewed role and benefits of statin for ASCVD risk reduction; Discussed  strategies to manage statin-induced myalgias; Reviewed importance of limiting foods high in  cholesterol;  Hypertension: (Status: Goal on Track (progressing): YES.) Last practice recorded BP readings:  BP Readings from Last 3 Encounters:  08/27/21 129/83  03/25/21 128/82  12/18/20 116/79  Most recent eGFR/CrCl:  Lab Results  Component Value Date   EGFR 34 (L) 08/27/2021    No components found for: CRCL  Evaluation of current treatment plan related to hypertension self management and patient's adherence to plan as established by provider;   Provided education to patient re: stroke prevention, s/s of heart attack and stroke; Reviewed prescribed diet heart healthy/Renal  Reviewed medications with patient and discussed importance of compliance;  Discussed plans with patient for ongoing care management follow up and provided patient with direct contact information for care management team; Advised patient, providing education and rationale, to monitor blood pressure daily and record, calling PCP for findings outside established parameters;  Provided education on prescribed diet heart healthy/Renal ;  Discussed complications of poorly controlled blood pressure such as heart disease, stroke, circulatory complications, vision complications, kidney impairment, sexual dysfunction;   Patient Goals/Self-Care Activities: Patient will self administer medications as prescribed as evidenced by self report/primary caregiver report  Patient will attend all scheduled provider appointments as evidenced by clinician review of documented attendance to scheduled appointments and patient/caregiver report Patient will call pharmacy for medication refills as evidenced by patient report and review of pharmacy fill history as appropriate Patient will attend church or other social activities as evidenced by patient report Patient will continue to perform ADL's independently as evidenced by patient/caregiver report Patient will continue to perform IADL's independently as evidenced by patient/caregiver  report Patient will call provider office for new concerns or questions as evidenced by review of documented incoming telephone call notes and patient report Patient will work with BSW to address care coordination needs and will continue to work with the clinical team to address health care and disease management related needs as evidenced by documented adherence to scheduled care management/care coordination appointments - check blood pressure weekly - choose a place to take my blood pressure (home, clinic or office, retail store) - write blood pressure results in a log or diary - learn about high blood pressure - keep a blood pressure log - take blood pressure log to all doctor appointments - call doctor for signs and symptoms of high blood pressure - develop an action plan for high blood pressure - keep all doctor appointments - take medications for blood pressure exactly as prescribed - begin an exercise program - report new symptoms to your doctor - eat more whole grains, fruits and vegetables, lean meats and healthy fats - call for medicine refill 2 or 3 days before it runs out - take all medications exactly as prescribed - call doctor with any symptoms you believe are related to your medicine - call doctor when you experience any new symptoms - go to all doctor appointments as scheduled - adhere to prescribed diet: heart healthy diet        Plan:Telephone follow up appointment with care management team member scheduled for:  11-26-2021 at 345 pm  [place], MSN, [facility] Mobile: [phone]    The patient verbalized understanding of instructions, educational materials, and care plan provided today and declined offer to receive copy of patient instructions, educational materials, and care plan.

## 2021-09-25 ENCOUNTER — Ambulatory Visit: Payer: Medicare Other | Admitting: Nurse Practitioner

## 2021-10-09 DIAGNOSIS — N1832 Chronic kidney disease, stage 3b: Secondary | ICD-10-CM | POA: Diagnosis not present

## 2021-10-09 DIAGNOSIS — E7800 Pure hypercholesterolemia, unspecified: Secondary | ICD-10-CM | POA: Diagnosis not present

## 2021-10-09 DIAGNOSIS — F209 Schizophrenia, unspecified: Secondary | ICD-10-CM | POA: Diagnosis not present

## 2021-10-09 DIAGNOSIS — I10 Essential (primary) hypertension: Secondary | ICD-10-CM | POA: Diagnosis not present

## 2021-10-10 ENCOUNTER — Other Ambulatory Visit: Payer: Self-pay | Admitting: *Deleted

## 2021-10-10 DIAGNOSIS — D472 Monoclonal gammopathy: Secondary | ICD-10-CM

## 2021-10-17 ENCOUNTER — Inpatient Hospital Stay: Payer: Medicare Other

## 2021-10-17 ENCOUNTER — Inpatient Hospital Stay: Payer: Medicare Other | Attending: Nurse Practitioner | Admitting: Nurse Practitioner

## 2021-10-17 NOTE — Progress Notes (Deleted)
[place]  Telephone:([phone]) [phone] [phone]  ID: Denise Everett OB: 14-Feb-1951  MR#: [id number]  YSA#:[id number]  Patient Care Team: Venita Lick, NP as PCP - General (Nurse Practitioner) Lloyd Huger, MD as Consulting Physician (Oncology) Vanita Ingles, RN as Case Manager (General Practice) Greg Cutter, LCSW as Social Worker (Licensed Clinical Social Worker)  CHIEF COMPLAINT: MGUS and ITP  INTERVAL HISTORY: Patient last evaluated in clinic in January 2019.  She is referred back for further evaluation of her platelet count and MGUS.  She continues to have weight loss of unknown etiology, but otherwise feels well.  She has no neurologic complaints.  She denies any recent fevers or illnesses.  She denies any easy bleeding or bruising.  She denies any chest pain, shortness of breath, cough, or hemoptysis.  She denies any nausea, vomiting, constipation, or diarrhea.  She has no melena or hematochezia.  She has no urinary complaints.  Patient offers no further specific complaints today.  REVIEW OF SYSTEMS:   Review of Systems  Constitutional:  Positive for weight loss. Negative for fever and malaise/fatigue.  Respiratory: Negative.  Negative for cough and shortness of breath.   Cardiovascular: Negative.  Negative for chest pain and leg swelling.  Gastrointestinal: Negative.  Negative for abdominal pain, blood in stool and melena.  Genitourinary: Negative.  Negative for dysuria.  Musculoskeletal: Negative.  Negative for back pain.  Skin: Negative.  Negative for rash.  Neurological: Negative.  Negative for dizziness, focal weakness, weakness and headaches.  Endo/Heme/Allergies:  Does not bruise/bleed easily.  Psychiatric/Behavioral: Negative.  The patient is not nervous/anxious.    As per HPI. Otherwise, a complete review of systems is negative.  PAST MEDICAL HISTORY: Past Medical History:  Diagnosis Date   Abnormal weight loss 06/07/2015    Allergy    Cataract    Chronic kidney disease    Chronic pain syndrome    Hyperlipidemia    Hypertension    Leg pain    Osteoporosis    hips   Renal insufficiency    Schizophrenia (HCC)    Thrombocytopenia (HCC)    Tobacco use disorder     PAST SURGICAL HISTORY: Past Surgical History:  Procedure Laterality Date   ankle surgery Right    x's 2   CATARACT EXTRACTION      FAMILY HISTORY Family History  Problem Relation Age of Onset   Hyperlipidemia Mother    Hypertension Mother        ADVANCED DIRECTIVES:    HEALTH MAINTENANCE: Social History   Tobacco Use   Smoking status: Former    Packs/day: 1.00    Years: 43.00    Pack years: 43.00    Types: Cigarettes    Quit date: 07/21/2017    Years since quitting: 4.2   Smokeless tobacco: Former    Types: Snuff  Vaping Use   Vaping Use: Never used  Substance Use Topics   Alcohol use: No   Drug use: No     Colonoscopy:  PAP:  Bone density:  Lipid panel:  Allergies  Allergen Reactions   No Known Allergies     Current Outpatient Medications  Medication Sig Dispense Refill   atorvastatin (LIPITOR) 10 MG tablet TAKE 1 TABLET BY MOUTH DAILY AT 6 PM 90 tablet 4   diclofenac Sodium (VOLTAREN) 1 % GEL Apply 4 g topically 4 (four) times daily. 50 g 1   famotidine (PEPCID) 20 MG tablet Take 1 tablet (20 mg total)  by mouth daily. 90 tablet 4   gabapentin (NEURONTIN) 300 MG capsule Take 1 capsule (300 mg total) by mouth 2 (two) times daily. 180 capsule 4   lisinopril (ZESTRIL) 5 MG tablet Take 1 tablet (5 mg total) by mouth daily. 90 tablet 4   mirtazapine (REMERON) 15 MG tablet Take 0.5 tablets (7.5 mg total) by mouth at bedtime. 45 tablet 4   ondansetron (ZOFRAN ODT) 4 MG disintegrating tablet Take 1 tablet (4 mg total) by mouth every 8 (eight) hours as needed for nausea or vomiting. 20 tablet 0   raloxifene (EVISTA) 60 MG tablet Take 1 tablet (60 mg total) by mouth daily. 90 tablet 4   risperiDONE (RISPERDAL) 2 MG  tablet Take 0.5-1 tablets (1-2 mg total) by mouth at bedtime. 90 tablet 4   tiZANidine (ZANAFLEX) 4 MG tablet Take 1 tablet (4 mg total) by mouth every 6 (six) hours as needed for muscle spasms. 60 tablet 4   No current facility-administered medications for this visit.    OBJECTIVE: There were no vitals filed for this visit.    There is no height or weight on file to calculate BMI.    ECOG FS:0 - Asymptomatic  General: Well-developed, well-nourished, no acute distress. Eyes: Pink conjunctiva, anicteric sclera. HEENT: Normocephalic, moist mucous membranes. Lungs: No audible wheezing or coughing. Heart: Regular rate and rhythm. Abdomen: Soft, nontender, no obvious distention. Musculoskeletal: No edema, cyanosis, or clubbing. Neuro: Alert, answering all questions appropriately. Cranial nerves grossly intact. Skin: No rashes or petechiae noted. Psych: Normal affect.    LAB RESULTS:  Lab Results  Component Value Date   NA 141 08/27/2021   K 4.0 08/27/2021   CL 102 08/27/2021   CO2 22 08/27/2021   GLUCOSE 77 08/27/2021   BUN 10 08/27/2021   CREATININE 1.59 (H) 08/27/2021   CALCIUM 9.1 08/27/2021   PROT 6.7 08/27/2021   ALBUMIN 4.3 08/27/2021   AST 10 08/27/2021   ALT 4 08/27/2021   ALKPHOS 62 08/27/2021   BILITOT 0.5 08/27/2021   GFRNONAA 21 (L) 11/06/2020   GFRAA 24 (L) 11/06/2020    Lab Results  Component Value Date   WBC 5.0 08/27/2021   NEUTROABS 2.8 08/27/2021   HGB 11.5 08/27/2021   HCT 35.1 08/27/2021   MCV 92 08/27/2021   PLT 53 (LL) 08/27/2021     STUDIES: No results found.   ASSESSMENT: MGUS and ITP.  PLAN:    1.  ITP: Previously, bone marrow biopsy confirmed the results.  Patient platelet count has mildly improved and is now greater than 100.  No intervention is needed at this time.   She last received Rituxan in September 2013.  Patient had a response to prednisone, but it was not durable.  Return to clinic in 3 months with repeat laboratory work  and further evaluation. 2. MGUS:  Bone marrow biopsy revealed less than 5% plasma cells.  His recent M spike was stable at 0.7.  Repeat SPEP from today is pending.  Return to clinic as above with repeat laboratory work and further evaluation. 3. Weight loss: Although patient's weight is decreased from earlier this year, it has increased several pounds in the last month.  Repeat laboratory work as above.  Consider CT scans in the future if weight loss continues.  I spent a total of 30 minutes reviewing chart data, face-to-face evaluation with the patient, counseling and coordination of care as detailed above.   Patient expressed understanding and was in agreement with this  plan. She also understands that She can call clinic at any time with any questions, concerns, or complaints.    Verlon Au, NP   10/17/2021 10:19 AM

## 2021-11-21 ENCOUNTER — Other Ambulatory Visit: Payer: Self-pay | Admitting: Nurse Practitioner

## 2021-11-21 DIAGNOSIS — M545 Low back pain, unspecified: Secondary | ICD-10-CM

## 2021-11-22 ENCOUNTER — Telehealth: Payer: Self-pay | Admitting: Nurse Practitioner

## 2021-11-22 ENCOUNTER — Ambulatory Visit: Payer: Medicare Other

## 2021-11-22 NOTE — Telephone Encounter (Signed)
Requested medication (s) are due for refill today:   Yes  Requested medication (s) are on the active medication list:   Yes  Future visit scheduled:   Yes in 3 months with Jolene Cannady   Last ordered: 08/06/2020 50g, 1 refill  Returned because this rx is from 2021.  Former pt of Noemi [facility] but been seeing Jolene.     Requested Prescriptions  Pending Prescriptions Disp Refills   diclofenac Sodium (VOLTAREN) 1 % GEL [Pharmacy Med Name: DICLOFENAC SODIUM 1% TOP GEL GM] 100 g     Sig: APPLY 4 GRAMS TOPICALLY 4 TIMES DAILY     Analgesics:  Topicals Passed - 11/21/2021  4:31 PM      Passed - Valid encounter within last 12 months    Recent Outpatient Visits           2 months ago Schizophrenia, unspecified type ([place])   [place] Cannady, Jolene T, NP   8 months ago Centrilobular emphysema ([place])   [place], Jolene T, NP   11 months ago Schizophrenia, unspecified type ([place])   [place], Barbaraann Faster, NP   1 year ago Abnormal weight loss   [place], [place] T, NP   1 year ago Idiopathic thrombocytopenic purpura ([place])   [place], Barbaraann Faster, NP       Future Appointments             Today  [place], [place]   In 3 months Cannady, Barbaraann Faster, NP [employer]

## 2021-11-22 NOTE — Telephone Encounter (Signed)
Copied from [place] [phone]. Topic: Medicare AWV >> Nov 22, 2021 12:09 PM Lavonia Drafts wrote: Reason for CRM: Left message for patient to call back and schedule the Medicare Annual Wellness Visit (AWV) virtually or by telephone.  Last AWV 11/19/20  Please schedule at anytime with [facility]FP-Nurse Health Advisor.  45 minute appointment  Any questions, please call me at [phone]

## 2021-11-26 ENCOUNTER — Ambulatory Visit (INDEPENDENT_AMBULATORY_CARE_PROVIDER_SITE_OTHER): Payer: Commercial Managed Care - HMO

## 2021-11-26 ENCOUNTER — Telehealth: Payer: Medicare Other

## 2021-11-26 DIAGNOSIS — F209 Schizophrenia, unspecified: Secondary | ICD-10-CM

## 2021-11-26 DIAGNOSIS — M79605 Pain in left leg: Secondary | ICD-10-CM

## 2021-11-26 DIAGNOSIS — N1832 Chronic kidney disease, stage 3b: Secondary | ICD-10-CM

## 2021-11-26 DIAGNOSIS — G8929 Other chronic pain: Secondary | ICD-10-CM

## 2021-11-26 DIAGNOSIS — I10 Essential (primary) hypertension: Secondary | ICD-10-CM

## 2021-11-26 DIAGNOSIS — E7800 Pure hypercholesterolemia, unspecified: Secondary | ICD-10-CM

## 2021-11-26 DIAGNOSIS — D472 Monoclonal gammopathy: Secondary | ICD-10-CM

## 2021-11-26 DIAGNOSIS — D693 Immune thrombocytopenic purpura: Secondary | ICD-10-CM

## 2021-11-26 NOTE — Chronic Care Management (AMB) (Signed)
Chronic Care Management   CCM RN Visit Note  11/26/2021 Name: Denise Everett MRN: [id number] DOB: [birth year]  Subjective: Denise Everett is a 71 y.o. year old female who is a primary care patient of Cannady, Barbaraann Faster, NP. The care management team was consulted for assistance with disease management and care coordination needs.    Engaged with patient by telephone for follow up visit in response to provider referral for case management and/or care coordination services.   Consent to Services:  The patient was given information about Chronic Care Management services, agreed to services, and gave verbal consent prior to initiation of services.  Please see initial visit note for detailed documentation.   Patient agreed to services and verbal consent obtained.   Assessment: Review of patient past medical history, allergies, medications, health status, including review of consultants reports, laboratory and other test data, was performed as part of comprehensive evaluation and provision of chronic care management services.   SDOH (Social Determinants of Health) assessments and interventions performed:    CCM Care Plan  Allergies  Allergen Reactions   No Known Allergies     Outpatient Encounter Medications as of 11/26/2021  Medication Sig   atorvastatin (LIPITOR) 10 MG tablet TAKE 1 TABLET BY MOUTH DAILY AT 6 PM   diclofenac Sodium (VOLTAREN) 1 % GEL APPLY 4 GRAMS TOPICALLY 4 TIMES DAILY   famotidine (PEPCID) 20 MG tablet Take 1 tablet (20 mg total) by mouth daily.   gabapentin (NEURONTIN) 300 MG capsule Take 1 capsule (300 mg total) by mouth 2 (two) times daily.   lisinopril (ZESTRIL) 5 MG tablet Take 1 tablet (5 mg total) by mouth daily.   mirtazapine (REMERON) 15 MG tablet Take 0.5 tablets (7.5 mg total) by mouth at bedtime.   ondansetron (ZOFRAN ODT) 4 MG disintegrating tablet Take 1 tablet (4 mg total) by mouth every 8 (eight) hours as needed for nausea or vomiting.   raloxifene  (EVISTA) 60 MG tablet Take 1 tablet (60 mg total) by mouth daily.   risperiDONE (RISPERDAL) 2 MG tablet Take 0.5-1 tablets (1-2 mg total) by mouth at bedtime.   tiZANidine (ZANAFLEX) 4 MG tablet Take 1 tablet (4 mg total) by mouth every 6 (six) hours as needed for muscle spasms.   No facility-administered encounter medications on file as of 11/26/2021.    Patient Active Problem List   Diagnosis Date Noted   Hematuria 03/25/2021   Aortic atherosclerosis ([place]) 10/20/2020   Centrilobular emphysema ([place]) 10/20/2020   CKD (chronic kidney disease) stage 3, GFR 30-59 ml/min ([facility]CC) 02/29/2020   Osteoarthritis of lumbar spine 04/15/2017   Lumbar facet arthropathy 04/15/2017   Lumbar facet syndrome (Bilateral) (R>L) 04/15/2017   Vitamin D insufficiency 03/18/2017   Marijuana use 03/03/2017   Chronic pain syndrome 02/24/2017   Chronic ankle pain  (Location of Tertiary source of pain) (Bilateral) (R>L) 02/24/2017   Abnormal MRI, lumbar spine (06/22/2014) 02/24/2017   Lumbar central spinal stenosis (Severe at L3-4) 02/24/2017   Lumbar foraminal stenosis (Severe: Left L3-4; Right L4-5; Bilateral L5-S1) 02/24/2017   Lumbar lateral recess stenosis (Severe: Left L3-4; Right L4-5; Left L5-S1) 02/24/2017   Personal history of tobacco use, presenting hazards to health 02/03/2017   Advance care planning 10/08/2016   MGUS (monoclonal gammopathy of unknown significance) 06/23/2016   Schizophrenia ([place]) 06/07/2015   Osteoporosis 06/07/2015   Chronic low back pain (Location of Primary Source of Pain) (Bilateral) (R>L) 06/07/2015   Idiopathic thrombocytopenic purpura ([place]) 06/07/2015   Allergic  rhinitis 06/07/2015   Chronic leg pain 06/07/2015   Hyperlipidemia 06/07/2015   Hypertension 06/07/2015   Lumbar IVDD (intervertebral disc displacement) 09/29/2014   Neuritis or radiculitis due to rupture of lumbar intervertebral disc 09/29/2014   Chronic Lumbar radiculitis 09/29/2014   Lumbar HNP (herniated nucleus  pulposus) 09/29/2014    Conditions to be addressed/monitored:HTN, HLD, CKD Stage 3, and MGUS, Schizophrenia, and Chronic pain  Care Plan : RNCM: General Plan of Care (Adult) for Chronic Disease Management and Care Coordination Needs  Updates made by Vanita Ingles, RN since 11/26/2021 12:00 AM     Problem: RNCM; Development of Plan of Care for Chronic Disease Management (HTN, HLD, CKD3, MGUS, and Schizophrenia, Chronic Pain )   Priority: High     Long-Range Goal: RNCM: Effective Management of Plan of Care for Chronic Disease Management (HTN, HLD, CKD3, MGUS, and Schizophrenia, Chronic pain)   Start Date: 09/24/2021  Expected End Date: 09/24/2022  Priority: High  Note:   Current Barriers:  Knowledge Deficits related to plan of care for management of HTN, HLD, CKD Stage 3, and Schizophrenia and MGUS, Chronic Pain Care Coordination needs related to Limited social support, Transportation, Mental Health Concerns , Social Isolation, and Literacy concerns  Chronic Disease Management support and education needs related to HTN, HLD, CKD Stage 3, and Shizophrenia and MGUS, Chronic pain  Lacks caregiver support.        Literacy barriers Transportation barriers- 11-26-2021- has missed several appointments. States she sets up transportation but then transportation tells her she has to renew. Have had issues with transportation for a long time. Review and education done Non-adherence to scheduled provider appointments Non-adherence to prescribed medication regimen  RNCM Clinical Goal(s):  Patient will verbalize basic understanding of HTN, HLD, CKD Stage Schizophrenia , and MGUS, Chronic pain,  disease process and self health management plan as evidenced by keeping appointments, following plan of care and taking medications as directed  take all medications exactly as prescribed and will call provider for medication related questions as evidenced by compliance    attend all scheduled medical  appointments: 10-17-2021 at 1 pm with oncology and 10-22-2021 at 130 pm with nephrology as evidenced by keeping appointments and calling if she cannot make the appointment. 11-26-2021: The patient missed both appointments. The patient states she had established transportation but they cancelled on her. She did not call and cancel appointments. The patient has a long standing history of non-compliance with follow up visits. The patient needs a rescheduled appointment with pcp in April. [place] office to call and schedule appointment with the patient.        demonstrate improved and ongoing adherence to prescribed treatment plan for HTN, HLD, CKD Stage 3, and Schizophrenia and MGUS, and chronic pain  as evidenced by working with the CCM team to optimize plan of care for effective management of health and well being  continue to work with Consulting civil engineer and/or Social Worker to address care management and care coordination needs related to HTN, HLD, CKD Stage 3, and Schizophrenia and MGUS, and chronic pain, as evidenced by adherence to CM Team Scheduled appointments     demonstrate ongoing self health care management ability for effective management of chronic conditions  as evidenced by  working with the CCM team through collaboration with Consulting civil engineer, provider, and care team.   Interventions: 1:1 collaboration with primary care provider regarding development and update of comprehensive plan of care as evidenced by provider attestation and co-signature Inter-disciplinary  care team collaboration (see longitudinal plan of care) Evaluation of current treatment plan related to  self management and patient's adherence to plan as established by provider   Chronic Kidney Disease (Status: Goal on Track (progressing): YES.)  Long Term Goal  Last practice recorded BP readings:  BP Readings from Last 3 Encounters:  08/27/21 129/83  03/25/21 128/82  12/18/20 116/79  Most recent eGFR/CrCl:  Lab Results   Component Value Date   EGFR 34 (L) 08/27/2021    No components found for: CRCL  Assessed the patient understanding of chronic kidney disease    Evaluation of current treatment plan related to chronic kidney disease self management and patient's adherence to plan as established by provider. 11-26-2021: Review of CKD today. The patient denies any new concerns with CKD.       Provided education to patient re: stroke prevention, s/s of heart attack and stroke    Reviewed prescribed diet heart healthy/Renal Reviewed medications with patient and discussed importance of compliance    Counseled on adverse effects of illicit drug and excessive alcohol use in patients with chronic kidney disease. 11-26-2021: The patient denies any alcohol use   Discussed complications of poorly controlled blood pressure such as heart disease, stroke, circulatory complications, vision complications, kidney impairment, sexual dysfunction    Reviewed scheduled/upcoming provider appointments including: Office to call and schedule an appointment with the patient for follow up in April with pcp.  Advised patient to discuss changes in kidney function  with provider    Discussed plans with patient for ongoing care management follow up and provided patient with direct contact information for care management team    Screening for signs and symptoms of depression related to chronic disease state      Discussed the impact of chronic kidney disease on daily life and mental health and acknowledged and normalized feelings of disempowerment, fear, and frustration    Assessed social determinant of health barriers    Provided education on kidney disease progression    Engage patient in early, proactive and ongoing discussion about goals of care and what matters most to them    Support coping and stress management by recognizing current strategies and assist in developing new strategies such as mindfulness, journaling, relaxation techniques,  problem-solving    Assisted the patient with making an appointment for follow up with the nephrologist on 10-22-2021 at 130 pm. The patient is going to arrange transportation with the "wagon". The patient able to tell RNCM her appointment and promised she would make it. 11-26-2021: The patient missed her appointment with nephrologist on 10-22-2021. The patient states she did not know she had to renew her transportation benefits. Education given. The RNCM has rescheduled this appointment multiple times. She did not call and cancel her appointment. The patient states she feels fine and she will talk to pcp about it at her next visit in April.   MGUS- monoclonal gammopathy of unknown significance   (Status: Goal on Track (progressing): YES.) Long Term Goal  Evaluation of current treatment plan related to  MGUS , Limited social support, Transportation, and Mental Health Concerns  self-management and patient's adherence to plan as established by provider. 11-26-2021: The patient did not make her appointment on 10-17-2021. The patient states that her transportation cancelled on her and she did not call and cancel the appointment. The patient has a long standing history of "no shows" to appointments. The patient feels she is doing well and denies any new concerns. Discussed plans  with patient for ongoing care management follow up and provided patient with direct contact information for care management team Advised patient to keep appointments, and follow the recommendations of the provider. 11-26-2021: The patient missed her appointment on 10-17-2021 with oncologist. Appointment for April cancelled with pcp. In basket message sent to the [facility] admin staff to call the patient to reschedule an appointment with the pcp for April; Provided education to patient re: the importance of follow up with oncologist due to low platelet count of 53. The patient verbalized understanding of the importance of keeping her appointment  ; Reviewed medications with patient and discussed states compliance with medications ; Reviewed scheduled/upcoming provider appointments including 10-17-2021 at 1 pm. Assisted the patient with making the appointment. She will get transportation with the "wagon". 11-26-2021: The patient missed her appointment due to transportation and did not call the office to cancel. She will discuss with the pcp at upcoming visit in April.  Discussed plans with patient for ongoing care management follow up and provided patient with direct contact information for care management team; Advised patient to discuss changes in platelets, and why they continue to decrease with provider; Screening for signs and symptoms of depression related to chronic disease state;  Assessed social determinant of health barriers;     Schizophrenia   (Status: Goal on Track (progressing): YES.) Long Term Goal  Evaluation of current treatment plan related to  Schizophrenia , Limited social support, Transportation, Level of care concerns, Mental Health Concerns , and Social Isolation self-management and patient's adherence to plan as established by provider. 11-26-2021: The patient states she is doing well. She is sleeping well and feels rested. She is having some pain in her legs but is taking Tylenol for pain relief. She paces her activity and has what she needs. States she still does not have an appetite but she eats and she feels like she is doing well. Denies any changes in mood, anxiety, or depression.  Discussed plans with patient for ongoing care management follow up and provided patient with direct contact information for care management team Advised patient to call the office for changes in mood, anxiety, or depression ; Provided education to patient re: monitoring for changes in mental health and regular follow ups with specialist and pcp ; Reviewed medications with patient and discussed compliance ; Reviewed scheduled/upcoming  provider appointments including 02-25-2022 at 2 pm- this appointment was cancelled last week. The office staff to call and reschedule and appointment with the pcp.  Discussed plans with patient for ongoing care management follow up and provided patient with direct contact information for care management team; Advised patient to discuss new concerns or changes in mental health  with provider; Screening for signs and symptoms of depression related to chronic disease state;  Assessed social determinant of health barriers;   Hyperlipidemia:  (Status: Goal on Track (progressing): YES.) Long Term Goal  Lab Results  Component Value Date   CHOL 179 08/27/2021   HDL 52 08/27/2021   LDLCALC 103 (H) 08/27/2021   TRIG 139 08/27/2021     Medication review performed; medication list updated in electronic medical record.  Provider established cholesterol goals reviewed; Counseled on importance of regular laboratory monitoring as prescribed. 11-26-2021: The patient has regular lab testing for cholesterol levels Provided HLD educational materials; Reviewed role and benefits of statin for ASCVD risk reduction; Discussed strategies to manage statin-induced myalgias; Reviewed importance of limiting foods high in cholesterol. 11-26-2021: The patient states she is eating, doesn't  have an appetite but she is eating and staying hydrated. Denies any acute findings related to dietary restrictions;  Hypertension: (Status: Goal on Track (progressing): YES.) Last practice recorded BP readings:  BP Readings from Last 3 Encounters:  08/27/21 129/83  03/25/21 128/82  12/18/20 116/79  Most recent eGFR/CrCl:  Lab Results  Component Value Date   EGFR 34 (L) 08/27/2021    No components found for: CRCL  Evaluation of current treatment plan related to hypertension self management and patient's adherence to plan as established by provider. 11-26-2021: The patient has normalized blood pressures. Denies any issues with Htn or  heart health;   Provided education to patient re: stroke prevention, s/s of heart attack and stroke; Reviewed prescribed diet heart healthy/Renal  Reviewed medications with patient and discussed importance of compliance. 11-26-2021: Reports compliance with medications. Review of medications.   Discussed plans with patient for ongoing care management follow up and provided patient with direct contact information for care management team; Advised patient, providing education and rationale, to monitor blood pressure daily and record, calling PCP for findings outside established parameters;  Provided education on prescribed diet heart healthy/Renal ;  Discussed complications of poorly controlled blood pressure such as heart disease, stroke, circulatory complications, vision complications, kidney impairment, sexual dysfunction;     Pain:  (Status: New goal. Goal on Track (progressing): YES.) Long Term Goal  Pain assessment performed. 11-26-2021: The patient states that her pain in her bilateral legs today is 8 on a scale of 0-10. She states it hurts from her thighs down to her feet and especially where she broke her ankle. She had laid back down after taking Tylenol and is feeling much better. Denies any falls or safety concerns.  Medications reviewed. 11-26-2021: The patient endorses taking Tylenol for pain relief. She states she is not using "goody powders" like she was. She is following the recommendation by the pcp not to take "goody powders".  Reviewed provider established plan for pain management. 11-26-2021: the patient states that she is managing her pain effectively. She denies any acute distress. States she only takes Tylenol when she needs it.  Discussed importance of adherence to all scheduled medical appointments. Office staff to call the patient and secure a new appointment with the pcp; Counseled on the importance of reporting any/all new or changed pain symptoms or management strategies to  pain management provider; Advised patient to report to care team affect of pain on daily activities; Discussed use of relaxation techniques and/or diversional activities to assist with pain reduction (distraction, imagery, relaxation, massage, acupressure, TENS, heat, and cold application; Reviewed with patient prescribed pharmacological and nonpharmacological pain relief strategies; Advised patient to discuss unresolved pain, changes in level or intensity of pain with provider; Screening for signs and symptoms of depression related to chronic disease state;  Assessed social determinant of health barriers;    Patient Goals/Self-Care Activities: Patient will self administer medications as prescribed as evidenced by self report/primary caregiver report  Patient will attend all scheduled provider appointments as evidenced by clinician review of documented attendance to scheduled appointments and patient/caregiver report Patient will call pharmacy for medication refills as evidenced by patient report and review of pharmacy fill history as appropriate Patient will attend church or other social activities as evidenced by patient report Patient will continue to perform ADL's independently as evidenced by patient/caregiver report Patient will continue to perform IADL's independently as evidenced by patient/caregiver report Patient will call provider office for new concerns or questions as evidenced  by review of documented incoming telephone call notes and patient report Patient will work with BSW to address care coordination needs and will continue to work with the clinical team to address health care and disease management related needs as evidenced by documented adherence to scheduled care management/care coordination appointments - check blood pressure weekly - choose a place to take my blood pressure (home, clinic or office, retail store) - write blood pressure results in a log or diary - learn about  high blood pressure - keep a blood pressure log - take blood pressure log to all doctor appointments - call doctor for signs and symptoms of high blood pressure - develop an action plan for high blood pressure - keep all doctor appointments - take medications for blood pressure exactly as prescribed - begin an exercise program - report new symptoms to your doctor - eat more whole grains, fruits and vegetables, lean meats and healthy fats - call for medicine refill 2 or 3 days before it runs out - take all medications exactly as prescribed - call doctor with any symptoms you believe are related to your medicine - call doctor when you experience any new symptoms - go to all doctor appointments as scheduled - adhere to prescribed diet: heart healthy diet        Plan:Telephone follow up appointment with care management team member scheduled for:  01-28-2022 at 230 pm  [place], MSN, [facility] Mobile: [phone]

## 2021-11-26 NOTE — Patient Instructions (Signed)
Visit Information  Thank you for taking time to visit with me today. Please don't hesitate to contact me if I can be of assistance to you before our next scheduled telephone appointment.  Following are the goals we discussed today:  RNCM Clinical Goal(s):  Patient will verbalize basic understanding of HTN, HLD, CKD Stage Schizophrenia , and MGUS, Chronic pain,  disease process and self health management plan as evidenced by keeping appointments, following plan of care and taking medications as directed  take all medications exactly as prescribed and will call provider for medication related questions as evidenced by compliance    attend all scheduled medical appointments: 10-17-2021 at 1 pm with oncology and 10-22-2021 at 130 pm with nephrology as evidenced by keeping appointments and calling if she cannot make the appointment. 11-26-2021: The patient missed both appointments. The patient states she had established transportation but they cancelled on her. She did not call and cancel appointments. The patient has a long standing history of non-compliance with follow up visits. The patient needs a rescheduled appointment with pcp in April. [place] office to call and schedule appointment with the patient.        demonstrate improved and ongoing adherence to prescribed treatment plan for HTN, HLD, CKD Stage 3, and Schizophrenia and MGUS, and chronic pain  as evidenced by working with the CCM team to optimize plan of care for effective management of health and well being  continue to work with Consulting civil engineer and/or Social Worker to address care management and care coordination needs related to HTN, HLD, CKD Stage 3, and Schizophrenia and MGUS, and chronic pain, as evidenced by adherence to CM Team Scheduled appointments     demonstrate ongoing self health care management ability for effective management of chronic conditions  as evidenced by  working with the CCM team through collaboration with Consulting civil engineer,  provider, and care team.    Interventions: 1:1 collaboration with primary care provider regarding development and update of comprehensive plan of care as evidenced by provider attestation and co-signature Inter-disciplinary care team collaboration (see longitudinal plan of care) Evaluation of current treatment plan related to  self management and patient's adherence to plan as established by provider     Chronic Kidney Disease (Status: Goal on Track (progressing): YES.)  Long Term Goal  Last practice recorded BP readings:     BP Readings from Last 3 Encounters:  08/27/21 129/83  03/25/21 128/82  12/18/20 116/79  Most recent eGFR/CrCl:       Lab Results  Component Value Date    EGFR 34 (L) 08/27/2021    No components found for: CRCL   Assessed the patient understanding of chronic kidney disease    Evaluation of current treatment plan related to chronic kidney disease self management and patient's adherence to plan as established by provider. 11-26-2021: Review of CKD today. The patient denies any new concerns with CKD.       Provided education to patient re: stroke prevention, s/s of heart attack and stroke    Reviewed prescribed diet heart healthy/Renal Reviewed medications with patient and discussed importance of compliance    Counseled on adverse effects of illicit drug and excessive alcohol use in patients with chronic kidney disease. 11-26-2021: The patient denies any alcohol use   Discussed complications of poorly controlled blood pressure such as heart disease, stroke, circulatory complications, vision complications, kidney impairment, sexual dysfunction    Reviewed scheduled/upcoming provider appointments including: Office to call and schedule an appointment  with the patient for follow up in April with pcp.  Advised patient to discuss changes in kidney function  with provider    Discussed plans with patient for ongoing care management follow up and provided patient with direct  contact information for care management team    Screening for signs and symptoms of depression related to chronic disease state      Discussed the impact of chronic kidney disease on daily life and mental health and acknowledged and normalized feelings of disempowerment, fear, and frustration    Assessed social determinant of health barriers    Provided education on kidney disease progression    Engage patient in early, proactive and ongoing discussion about goals of care and what matters most to them    Support coping and stress management by recognizing current strategies and assist in developing new strategies such as mindfulness, journaling, relaxation techniques, problem-solving    Assisted the patient with making an appointment for follow up with the nephrologist on 10-22-2021 at 130 pm. The patient is going to arrange transportation with the "wagon". The patient able to tell RNCM her appointment and promised she would make it. 11-26-2021: The patient missed her appointment with nephrologist on 10-22-2021. The patient states she did not know she had to renew her transportation benefits. Education given. The RNCM has rescheduled this appointment multiple times. She did not call and cancel her appointment. The patient states she feels fine and she will talk to pcp about it at her next visit in April.     MGUS- monoclonal gammopathy of unknown significance   (Status: Goal on Track (progressing): YES.) Long Term Goal  Evaluation of current treatment plan related to  MGUS , Limited social support, Transportation, and Mental Health Concerns  self-management and patient's adherence to plan as established by provider. 11-26-2021: The patient did not make her appointment on 10-17-2021. The patient states that her transportation cancelled on her and she did not call and cancel the appointment. The patient has a long standing history of "no shows" to appointments. The patient feels she is doing well and denies  any new concerns. Discussed plans with patient for ongoing care management follow up and provided patient with direct contact information for care management team Advised patient to keep appointments, and follow the recommendations of the provider. 11-26-2021: The patient missed her appointment on 10-17-2021 with oncologist. Appointment for April cancelled with pcp. In basket message sent to the [facility] admin staff to call the patient to reschedule an appointment with the pcp for April; Provided education to patient re: the importance of follow up with oncologist due to low platelet count of 53. The patient verbalized understanding of the importance of keeping her appointment ; Reviewed medications with patient and discussed states compliance with medications ; Reviewed scheduled/upcoming provider appointments including 10-17-2021 at 1 pm. Assisted the patient with making the appointment. She will get transportation with the "wagon". 11-26-2021: The patient missed her appointment due to transportation and did not call the office to cancel. She will discuss with the pcp at upcoming visit in April.  Discussed plans with patient for ongoing care management follow up and provided patient with direct contact information for care management team; Advised patient to discuss changes in platelets, and why they continue to decrease with provider; Screening for signs and symptoms of depression related to chronic disease state;  Assessed social determinant of health barriers;       Schizophrenia   (Status: Goal on Track (progressing): YES.)  Long Term Goal  Evaluation of current treatment plan related to  Schizophrenia , Limited social support, Transportation, Level of care concerns, Mental Health Concerns , and Social Isolation self-management and patient's adherence to plan as established by provider. 11-26-2021: The patient states she is doing well. She is sleeping well and feels rested. She is having some pain in her  legs but is taking Tylenol for pain relief. She paces her activity and has what she needs. States she still does not have an appetite but she eats and she feels like she is doing well. Denies any changes in mood, anxiety, or depression.  Discussed plans with patient for ongoing care management follow up and provided patient with direct contact information for care management team Advised patient to call the office for changes in mood, anxiety, or depression ; Provided education to patient re: monitoring for changes in mental health and regular follow ups with specialist and pcp ; Reviewed medications with patient and discussed compliance ; Reviewed scheduled/upcoming provider appointments including 02-25-2022 at 2 pm- this appointment was cancelled last week. The office staff to call and reschedule and appointment with the pcp.  Discussed plans with patient for ongoing care management follow up and provided patient with direct contact information for care management team; Advised patient to discuss new concerns or changes in mental health  with provider; Screening for signs and symptoms of depression related to chronic disease state;  Assessed social determinant of health barriers;    Hyperlipidemia:  (Status: Goal on Track (progressing): YES.) Long Term Goal       Lab Results  Component Value Date    CHOL 179 08/27/2021    HDL 52 08/27/2021    LDLCALC 103 (H) 08/27/2021    TRIG 139 08/27/2021      Medication review performed; medication list updated in electronic medical record.  Provider established cholesterol goals reviewed; Counseled on importance of regular laboratory monitoring as prescribed. 11-26-2021: The patient has regular lab testing for cholesterol levels Provided HLD educational materials; Reviewed role and benefits of statin for ASCVD risk reduction; Discussed strategies to manage statin-induced myalgias; Reviewed importance of limiting foods high in cholesterol. 11-26-2021:  The patient states she is eating, doesn't have an appetite but she is eating and staying hydrated. Denies any acute findings related to dietary restrictions;   Hypertension: (Status: Goal on Track (progressing): YES.) Last practice recorded BP readings:     BP Readings from Last 3 Encounters:  08/27/21 129/83  03/25/21 128/82  12/18/20 116/79  Most recent eGFR/CrCl:       Lab Results  Component Value Date    EGFR 34 (L) 08/27/2021    No components found for: CRCL   Evaluation of current treatment plan related to hypertension self management and patient's adherence to plan as established by provider. 11-26-2021: The patient has normalized blood pressures. Denies any issues with Htn or heart health;   Provided education to patient re: stroke prevention, s/s of heart attack and stroke; Reviewed prescribed diet heart healthy/Renal  Reviewed medications with patient and discussed importance of compliance. 11-26-2021: Reports compliance with medications. Review of medications.   Discussed plans with patient for ongoing care management follow up and provided patient with direct contact information for care management team; Advised patient, providing education and rationale, to monitor blood pressure daily and record, calling PCP for findings outside established parameters;  Provided education on prescribed diet heart healthy/Renal ;  Discussed complications of poorly controlled blood pressure such as heart disease,  stroke, circulatory complications, vision complications, kidney impairment, sexual dysfunction;        Pain:  (Status: New goal. Goal on Track (progressing): YES.) Long Term Goal  Pain assessment performed. 11-26-2021: The patient states that her pain in her bilateral legs today is 8 on a scale of 0-10. She states it hurts from her thighs down to her feet and especially where she broke her ankle. She had laid back down after taking Tylenol and is feeling much better. Denies any falls or  safety concerns.  Medications reviewed. 11-26-2021: The patient endorses taking Tylenol for pain relief. She states she is not using "goody powders" like she was. She is following the recommendation by the pcp not to take "goody powders".  Reviewed provider established plan for pain management. 11-26-2021: the patient states that she is managing her pain effectively. She denies any acute distress. States she only takes Tylenol when she needs it.  Discussed importance of adherence to all scheduled medical appointments. Office staff to call the patient and secure a new appointment with the pcp; Counseled on the importance of reporting any/all new or changed pain symptoms or management strategies to pain management provider; Advised patient to report to care team affect of pain on daily activities; Discussed use of relaxation techniques and/or diversional activities to assist with pain reduction (distraction, imagery, relaxation, massage, acupressure, TENS, heat, and cold application; Reviewed with patient prescribed pharmacological and nonpharmacological pain relief strategies; Advised patient to discuss unresolved pain, changes in level or intensity of pain with provider; Screening for signs and symptoms of depression related to chronic disease state;  Assessed social determinant of health barriers;     Patient Goals/Self-Care Activities: Patient will self administer medications as prescribed as evidenced by self report/primary caregiver report  Patient will attend all scheduled provider appointments as evidenced by clinician review of documented attendance to scheduled appointments and patient/caregiver report Patient will call pharmacy for medication refills as evidenced by patient report and review of pharmacy fill history as appropriate Patient will attend church or other social activities as evidenced by patient report Patient will continue to perform ADL's independently as evidenced by  patient/caregiver report Patient will continue to perform IADL's independently as evidenced by patient/caregiver report Patient will call provider office for new concerns or questions as evidenced by review of documented incoming telephone call notes and patient report Patient will work with BSW to address care coordination needs and will continue to work with the clinical team to address health care and disease management related needs as evidenced by documented adherence to scheduled care management/care coordination appointments - check blood pressure weekly - choose a place to take my blood pressure (home, clinic or office, retail store) - write blood pressure results in a log or diary - learn about high blood pressure - keep a blood pressure log - take blood pressure log to all doctor appointments - call doctor for signs and symptoms of high blood pressure - develop an action plan for high blood pressure - keep all doctor appointments - take medications for blood pressure exactly as prescribed - begin an exercise program - report new symptoms to your doctor - eat more whole grains, fruits and vegetables, lean meats and healthy fats - call for medicine refill 2 or 3 days before it runs out - take all medications exactly as prescribed - call doctor with any symptoms you believe are related to your medicine - call doctor when you experience any new symptoms - go to  all doctor appointments as scheduled - adhere to prescribed diet: heart healthy diet       Our next appointment is by telephone on 01-28-2022 at 230 pm  Please call the care guide team at [phone] if you need to cancel or reschedule your appointment.   If you are experiencing a Mental Health or [place] or need someone to talk to, please call the Suicide and Crisis Lifeline: 988 call the [place] National Suicide Prevention Lifeline: 8250730006 or TTY: 657-647-7574 TTY 534-133-9225) to talk to a trained  counselor call 1-800-273-TALK (toll free, 24 hour hotline)   The patient verbalized understanding of instructions, educational materials, and care plan provided today and declined offer to receive copy of patient instructions, educational materials, and care plan.   Noreene Larsson RN, MSN, [facility] Mobile: [phone]

## 2021-11-27 ENCOUNTER — Ambulatory Visit (INDEPENDENT_AMBULATORY_CARE_PROVIDER_SITE_OTHER): Payer: Commercial Managed Care - HMO | Admitting: *Deleted

## 2021-11-27 ENCOUNTER — Other Ambulatory Visit: Payer: Self-pay

## 2021-11-27 DIAGNOSIS — Z1231 Encounter for screening mammogram for malignant neoplasm of breast: Secondary | ICD-10-CM

## 2021-11-27 DIAGNOSIS — Z0000 Encounter for general adult medical examination without abnormal findings: Secondary | ICD-10-CM | POA: Diagnosis not present

## 2021-11-27 DIAGNOSIS — Z1211 Encounter for screening for malignant neoplasm of colon: Secondary | ICD-10-CM

## 2021-11-27 MED ORDER — NA SULFATE-K SULFATE-MG SULF 17.5-3.13-1.6 GM/177ML PO SOLN: 1.0000 | Freq: Once | ORAL | 0 refills | Status: AC

## 2021-11-27 NOTE — Progress Notes (Signed)
Subjective:   Denise Everett is a 71 y.o. female who presents for Medicare Annual (Subsequent) preventive examination.  I connected with  [name] on 11/27/21 by a  telephone enabled telemedicine application and verified that I am speaking with the correct person using two identifiers.   I discussed the limitations of evaluation and management by telemedicine. The patient expressed understanding and agreed to proceed.  Patient location: home  Provider location: Tele-health  not in office  I  Review of Systems     Cardiac Risk Factors include: advanced age (>47men, >52 women);hypertension;sedentary lifestyle     Objective:    Today's Vitals   There is no height or weight on file to calculate BMI.  Advanced Directives 11/27/2021 11/19/2020 08/30/2020 05/10/2020 11/16/2019 11/11/2018 11/24/2017  Does Patient Have a Medical Advance Directive? No No No No No No No  Would patient like information on creating a medical advance directive? No - Patient declined - No - Patient declined No - Patient declined - Yes (MAU/Ambulatory/Procedural Areas - Information given) -    Current Medications (verified) Outpatient Encounter Medications as of 11/27/2021  Medication Sig   atorvastatin (LIPITOR) 10 MG tablet TAKE 1 TABLET BY MOUTH DAILY AT 6 PM   diclofenac Sodium (VOLTAREN) 1 % GEL APPLY 4 GRAMS TOPICALLY 4 TIMES DAILY   famotidine (PEPCID) 20 MG tablet Take 1 tablet (20 mg total) by mouth daily.   gabapentin (NEURONTIN) 300 MG capsule Take 1 capsule (300 mg total) by mouth 2 (two) times daily.   lisinopril (ZESTRIL) 5 MG tablet Take 1 tablet (5 mg total) by mouth daily.   mirtazapine (REMERON) 15 MG tablet Take 0.5 tablets (7.5 mg total) by mouth at bedtime.   ondansetron (ZOFRAN ODT) 4 MG disintegrating tablet Take 1 tablet (4 mg total) by mouth every 8 (eight) hours as needed for nausea or vomiting.   raloxifene (EVISTA) 60 MG tablet Take 1 tablet (60 mg total) by mouth daily.    risperiDONE (RISPERDAL) 2 MG tablet Take 0.5-1 tablets (1-2 mg total) by mouth at bedtime.   tiZANidine (ZANAFLEX) 4 MG tablet Take 1 tablet (4 mg total) by mouth every 6 (six) hours as needed for muscle spasms.   No facility-administered encounter medications on file as of 11/27/2021.    Allergies (verified) No known allergies   History: Past Medical History:  Diagnosis Date   Abnormal weight loss 06/07/2015   Allergy    Cataract    Chronic kidney disease    Chronic pain syndrome    Hyperlipidemia    Hypertension    Leg pain    Osteoporosis    hips   Renal insufficiency    Schizophrenia (HCC)    Thrombocytopenia (HCC)    Tobacco use disorder    Past Surgical History:  Procedure Laterality Date   ankle surgery Right    x's 2   CATARACT EXTRACTION     Family History  Problem Relation Age of Onset   Hyperlipidemia Mother    Hypertension Mother    Social History   Socioeconomic History   Marital status: Single    Spouse name: Not on file   Number of children: Not on file   Years of education: Not on file   Highest education level: Not on file  Occupational History   Occupation: retired  Tobacco Use   Smoking status: Former    Packs/day: 1.00    Years: 43.00    Pack years: 43.00  Types: Cigarettes    Quit date: 07/21/2017    Years since quitting: 4.3   Smokeless tobacco: Former    Types: Snuff  Vaping Use   Vaping Use: Never used  Substance and Sexual Activity   Alcohol use: No   Drug use: No   Sexual activity: Yes  Other Topics Concern   Not on file  Social History Narrative   Not on file   Social Determinants of Health   Financial Resource Strain: Medium Risk   Difficulty of Paying Living Expenses: Somewhat hard  Food Insecurity: No Food Insecurity   Worried About Charity fundraiser in the Last Year: Never true   Ran Out of Food in the Last Year: Never true  Transportation Needs: No Transportation Needs   Lack of Transportation (Medical): No    Lack of Transportation (Non-Medical): No  Physical Activity: Inactive   Days of Exercise per Week: 0 days   Minutes of Exercise per Session: 0 min  Stress: No Stress Concern Present   Feeling of Stress : Only a little  Social Connections: Socially Isolated   Frequency of Communication with Friends and Family: Three times a week   Frequency of Social Gatherings with Friends and Family: Three times a week   Attends Religious Services: Never   Active Member of Clubs or Organizations: No   Attends Music therapist: Never   Marital Status: Divorced    Tobacco Counseling Counseling given: Not Answered   Clinical Intake:  Pre-visit preparation completed: No  Pain : No/denies pain     Nutritional Risks: None Diabetes: No  How often do you need to have someone help you when you read instructions, pamphlets, or other written materials from your doctor or pharmacy?: 1 - Never  Diabetic?  no  Interpreter Needed?: No  Information entered by :: Leroy Kennedy LPN   Activities of Daily Living In your present state of health, do you have any difficulty performing the following activities: 11/27/2021  Hearing? N  Vision? N  Difficulty concentrating or making decisions? N  Walking or climbing stairs? N  Dressing or bathing? N  Doing errands, shopping? N  Preparing Food and eating ? N  Using the Toilet? N  In the past six months, have you accidently leaked urine? N  Do you have problems with loss of bowel control? N  Managing your Medications? N  Managing your Finances? N  Housekeeping or managing your Housekeeping? N  Some recent data might be hidden    Patient Care Team: Venita Lick, NP as PCP - General (Nurse Practitioner) Lloyd Huger, MD as Consulting Physician (Oncology) Vanita Ingles, RN as Case Manager ([place]) Greg Cutter, LCSW as Social Worker (Licensed Clinical Social Worker)  Indicate any recent [employer] you may  have received from other than [facility] providers in the past year (date may be approximate).     Assessment:   This is a routine wellness examination for Denise Everett.  Hearing/Vision screen Vision Screening - Comments:: Not up to date Eye Lab  Dietary issues and exercise activities discussed: Current Exercise Habits: The patient does not participate in regular exercise at present, Exercise limited by: None identified   Goals Addressed             This Visit's Progress    Patient Stated       No goals       Depression Screen [facility] 2/9 Scores 11/27/2021 03/25/2021 12/18/2020 11/19/2020 11/06/2020 07/06/2020  03/28/2020  PHQ - 2 Score 0 3 0 0 2 0 0  PHQ- 9 Score - 8 0 6 8 0 0    Fall Risk Fall Risk  11/27/2021 12/18/2020 11/19/2020 11/06/2020 11/16/2019  Falls in the past year? 0 0 1 0 0  Comment - - legs gave out four times - -  Number falls in past yr: 0 - 1 - 0  Injury with Fall? 0 0 0 - 0  Comment - - - - -  Risk for fall due to : - - Medication side effect;Impaired mobility - -  Risk for fall due to: Comment - - - - -  Follow up Falls evaluation completed;Falls prevention discussed - Falls evaluation completed;Education provided;Falls prevention discussed - -    FALL RISK PREVENTION PERTAINING TO THE HOME:  Any stairs in or around the home? No  If so, are there any without handrails? No  Home free of loose throw rugs in walkways, pet beds, electrical cords, etc? Yes  Adequate lighting in your home to reduce risk of falls? Yes   ASSISTIVE DEVICES UTILIZED TO PREVENT FALLS:  Life alert? No  Use of a cane, walker or w/c? No  Grab bars in the bathroom? Yes  Shower chair or bench in shower? No  Elevated toilet seat or a handicapped toilet? No   TIMED UP AND GO:  Was the test performed? No .    Cognitive Function:     6CIT Screen 11/27/2021 11/19/2020 11/11/2018 09/16/2017  What Year? 0 points 0 points 0 points 0 points  What month? 0 points 0 points 0 points 0 points  What time?  0 points 0 points 0 points 0 points  Count back from 20 0 points 2 points 0 points 0 points  Months in reverse 0 points 2 points 2 points 0 points  Repeat phrase 0 points 10 points 4 points 8 points  Total Score 0 14 6 8     Immunizations Immunization History  Administered Date(s) Administered   Fluad Quad(high Dose 65+) 08/16/2019, 08/06/2020, 08/27/2021   Influenza, High Dose Seasonal PF 08/08/2016, 09/16/2017, 07/30/2018   Influenza,inj,Quad PF,6+ Mos 01/01/2016   PFIZER(Purple Top)SARS-COV-2 Vaccination 02/16/2020, 03/08/2020   Pneumococcal Conjugate-13 10/16/2014   Pneumococcal Polysaccharide-23 01/01/2016   Td 04/15/1999, 07/26/2009    TDAP status: Due, Education has been provided regarding the importance of this vaccine. Advised may receive this vaccine at local pharmacy or Health Dept. Aware to provide a copy of the vaccination record if obtained from local pharmacy or Health Dept. Verbalized acceptance and understanding.  Flu Vaccine status: Up to date  Pneumococcal vaccine status: Up to date  Covid-19 vaccine status: Information provided on how to obtain vaccines.   Qualifies for Shingles Vaccine? Yes   Zostavax completed No   Shingrix Completed?: No.    Education has been provided regarding the importance of this vaccine. Patient has been advised to call insurance company to determine out of pocket expense if they have not yet received this vaccine. Advised may also receive vaccine at local pharmacy or Health Dept. Verbalized acceptance and understanding.  Screening Tests Health Maintenance  Topic Date Due   COVID-19 Vaccine (3 - Pfizer risk series) 04/05/2020   COLON CANCER SCREENING ANNUAL FOBT  02/15/2021   TETANUS/TDAP  12/18/2021 (Originally 07/27/2019)   Zoster Vaccines- Shingrix (1 of 2) 02/25/2022 (Originally 03/12/1970)   MAMMOGRAM  01/25/2023   Pneumonia Vaccine 4+ Years old  Completed   INFLUENZA VACCINE  Completed  DEXA SCAN  Completed   Hepatitis C  Screening  Completed   HPV VACCINES  Aged Out   COLONOSCOPY (Pts 45-56yrs Insurance coverage will need to be confirmed)  Discontinued   Fecal DNA (Cologuard)  Discontinued    Health Maintenance  Health Maintenance Due  Topic Date Due   COVID-19 Vaccine (3 - Pfizer risk series) 04/05/2020   COLON CANCER SCREENING ANNUAL FOBT  02/15/2021    Colorectal cancer screening: Type of screening: Colonoscopy. Completed 2021. Repeat every 1 years  Mammogram status: Ordered  . Pt provided with contact info and advised to call to schedule appt.   Bone Density status: Completed 2011. Results reflect: Bone density results: OSTEOPOROSIS. Repeat every 2 years.  Lung Cancer Screening: (Low Dose CT Chest recommended if Age 37-80 years, 30 pack-year currently smoking OR have quit w/in 15years.) does not qualify.   Lung Cancer Screening Referral:   Additional Screening:  Hepatitis C Screening: does not qualify; Completed   Vision Screening: Recommended annual ophthalmology exams for early detection of glaucoma and other disorders of the eye. Is the patient up to date with their annual eye exam?  Yes  Who is the provider or what is the name of the office in which the patient attends annual eye exams? Eye Lab If pt is not established with a provider, would they like to be referred to a provider to establish care? No .   Dental Screening: Recommended annual dental exams for proper oral hygiene  Community Resource Referral / Chronic Care Management: CRR required this visit?  No   CCM required this visit?  No      Plan:     I have personally reviewed and noted the following in the patients chart:   Medical and social history Use of alcohol, tobacco or illicit drugs  Current medications and supplements including opioid prescriptions.  Functional ability and status Nutritional status Physical activity Advanced directives List of other physicians Hospitalizations, surgeries, and ER visits  in previous 12 months Vitals Screenings to include cognitive, depression, and falls Referrals and appointments  In addition, I have reviewed and discussed with patient certain preventive protocols, quality metrics, and best practice recommendations. A written personalized care plan for preventive services as well as general preventive health recommendations were provided to patient.     Leroy Kennedy, LPN   1/66/0630   Nurse Notes:

## 2021-11-27 NOTE — Progress Notes (Signed)
Gastroenterology Pre-Procedure Review  Request Date: 01/09/2022 Requesting Physician: Dr. Marius Ditch  PATIENT REVIEW QUESTIONS: The patient responded to the following health history questions as indicated:    1. Are you having any GI issues? no 2. Do you have a personal history of Polyps? no 3. Do you have a family history of Colon Cancer or Polyps? no 4. Diabetes Mellitus? no 5. Joint replacements in the past 12 months?no 6. Major health problems in the past 3 months?no 7. Any artificial heart valves, MVP, or defibrillator?no    MEDICATIONS & ALLERGIES:    Patient reports the following regarding taking any anticoagulation/antiplatelet therapy:   Plavix, Coumadin, Eliquis, Xarelto, Lovenox, Pradaxa, Brilinta, or Effient? no Aspirin? no  Patient confirms/reports the following medications:  Current Outpatient Medications  Medication Sig Dispense Refill   atorvastatin (LIPITOR) 10 MG tablet TAKE 1 TABLET BY MOUTH DAILY AT 6 PM 90 tablet 4   diclofenac Sodium (VOLTAREN) 1 % GEL APPLY 4 GRAMS TOPICALLY 4 TIMES DAILY 100 g 4   famotidine (PEPCID) 20 MG tablet Take 1 tablet (20 mg total) by mouth daily. 90 tablet 4   gabapentin (NEURONTIN) 300 MG capsule Take 1 capsule (300 mg total) by mouth 2 (two) times daily. 180 capsule 4   lisinopril (ZESTRIL) 5 MG tablet Take 1 tablet (5 mg total) by mouth daily. 90 tablet 4   mirtazapine (REMERON) 15 MG tablet Take 0.5 tablets (7.5 mg total) by mouth at bedtime. 45 tablet 4   ondansetron (ZOFRAN ODT) 4 MG disintegrating tablet Take 1 tablet (4 mg total) by mouth every 8 (eight) hours as needed for nausea or vomiting. 20 tablet 0   raloxifene (EVISTA) 60 MG tablet Take 1 tablet (60 mg total) by mouth daily. 90 tablet 4   risperiDONE (RISPERDAL) 2 MG tablet Take 0.5-1 tablets (1-2 mg total) by mouth at bedtime. 90 tablet 4   tiZANidine (ZANAFLEX) 4 MG tablet Take 1 tablet (4 mg total) by mouth every 6 (six) hours as needed for muscle spasms. 60 tablet 4    No current facility-administered medications for this visit.    Patient confirms/reports the following allergies:  Allergies  Allergen Reactions   No Known Allergies     No orders of the defined types were placed in this encounter.   AUTHORIZATION INFORMATION Primary Insurance: 1D#: Group #:  Secondary Insurance: 1D#: Group #:  SCHEDULE INFORMATION: Date: 01/09/2022 Time: Location: [facility]

## 2021-11-27 NOTE — Patient Instructions (Signed)
Denise Everett , Thank you for taking time to come for your Medicare Wellness Visit. I appreciate your ongoing commitment to your health goals. Please review the following plan we discussed and let me know if I can assist you in the future.   Screening recommendations/referrals: Colonoscopy: Education provided Mammogram: Education provided Bone Density: Education provided Recommended yearly ophthalmology/optometry visit for glaucoma screening and checkup Recommended yearly dental visit for hygiene and checkup  Vaccinations: Influenza vaccine: up to date Pneumococcal vaccine: up to date Tdap vaccine: Education provided Shingles vaccine: Education provided    Advanced directives: Education provided  Conditions/risks identified:   Next appointment: 01-28-2022 2:30 [place] 65 Years and Older, Female Preventive care refers to lifestyle choices and visits with your health care provider that can promote health and wellness. What does preventive care include? A yearly physical exam. This is also called an annual well check. Dental exams once or twice a year. Routine eye exams. Ask your health care provider how often you should have your eyes checked. Personal lifestyle choices, including: Daily care of your teeth and gums. Regular physical activity. Eating a healthy diet. Avoiding tobacco and drug use. Limiting alcohol use. Practicing safe sex. Taking low-dose aspirin every day. Taking vitamin and mineral supplements as recommended by your health care provider. What happens during an annual well check? The services and screenings done by your health care provider during your annual well check will depend on your age, overall health, lifestyle risk factors, and family history of disease. Counseling  Your health care provider may ask you questions about your: Alcohol use. Tobacco use. Drug use. Emotional well-being. Home and relationship well-being. Sexual  activity. Eating habits. History of falls. Memory and ability to understand (cognition). Work and work Statistician. Reproductive health. Screening  You may have the following tests or measurements: Height, weight, and BMI. Blood pressure. Lipid and cholesterol levels. These may be checked every 5 years, or more frequently if you are over 21 years old. Skin check. Lung cancer screening. You may have this screening every year starting at age 53 if you have a 30-pack-year history of smoking and currently smoke or have quit within the past 15 years. Fecal occult blood test (FOBT) of the stool. You may have this test every year starting at age 15. Flexible sigmoidoscopy or colonoscopy. You may have a sigmoidoscopy every 5 years or a colonoscopy every 10 years starting at age 59. Hepatitis C blood test. Hepatitis B blood test. Sexually transmitted disease (STD) testing. Diabetes screening. This is done by checking your blood sugar (glucose) after you have not eaten for a while (fasting). You may have this done every 1-3 years. Bone density scan. This is done to screen for osteoporosis. You may have this done starting at age 37. Mammogram. This may be done every 1-2 years. Talk to your health care provider about how often you should have regular mammograms. Talk with your health care provider about your test results, treatment options, and if necessary, the need for more tests. Vaccines  Your health care provider may recommend certain vaccines, such as: Influenza vaccine. This is recommended every year. Tetanus, diphtheria, and acellular pertussis (Tdap, Td) vaccine. You may need a Td booster every 10 years. Zoster vaccine. You may need this after age 10. Pneumococcal 13-valent conjugate (PCV13) vaccine. One dose is recommended after age 49. Pneumococcal polysaccharide (PPSV23) vaccine. One dose is recommended after age 18. Talk to your health care provider about which screenings and vaccines  you need and how often you need them. This information is not intended to replace advice given to you by your health care provider. Make sure you discuss any questions you have with your health care provider. Document Released: 11/23/2015 Document Revised: 07/16/2016 Document Reviewed: 08/28/2015 Elsevier Interactive Patient Education  2017 [place] Prevention in the Home Falls can cause injuries. They can happen to people of all ages. There are many things you can do to make your home safe and to help prevent falls. What can I do on the outside of my home? Regularly fix the edges of walkways and driveways and fix any cracks. Remove anything that might make you trip as you walk through a door, such as a raised step or threshold. Trim any bushes or trees on the path to your home. Use bright outdoor lighting. Clear any walking paths of anything that might make someone trip, such as rocks or tools. Regularly check to see if handrails are loose or broken. Make sure that both sides of any steps have handrails. Any raised decks and porches should have guardrails on the edges. Have any leaves, snow, or ice cleared regularly. Use sand or salt on walking paths during winter. Clean up any spills in your garage right away. This includes oil or grease spills. What can I do in the bathroom? Use night lights. Install grab bars by the toilet and in the tub and shower. Do not use towel bars as grab bars. Use non-skid mats or decals in the tub or shower. If you need to sit down in the shower, use a plastic, non-slip stool. Keep the floor dry. Clean up any water that spills on the floor as soon as it happens. Remove soap buildup in the tub or shower regularly. Attach bath mats securely with double-sided non-slip rug tape. Do not have throw rugs and other things on the floor that can make you trip. What can I do in the bedroom? Use night lights. Make sure that you have a light by your bed that  is easy to reach. Do not use any sheets or blankets that are too big for your bed. They should not hang down onto the floor. Have a firm chair that has side arms. You can use this for support while you get dressed. Do not have throw rugs and other things on the floor that can make you trip. What can I do in the kitchen? Clean up any spills right away. Avoid walking on wet floors. Keep items that you use a lot in easy-to-reach places. If you need to reach something above you, use a strong step stool that has a grab bar. Keep electrical cords out of the way. Do not use floor polish or wax that makes floors slippery. If you must use wax, use non-skid floor wax. Do not have throw rugs and other things on the floor that can make you trip. What can I do with my stairs? Do not leave any items on the stairs. Make sure that there are handrails on both sides of the stairs and use them. Fix handrails that are broken or loose. Make sure that handrails are as long as the stairways. Check any carpeting to make sure that it is firmly attached to the stairs. Fix any carpet that is loose or worn. Avoid having throw rugs at the top or bottom of the stairs. If you do have throw rugs, attach them to the floor with carpet tape. Make sure that  you have a light switch at the top of the stairs and the bottom of the stairs. If you do not have them, ask someone to add them for you. What else can I do to help prevent falls? Wear shoes that: Do not have high heels. Have rubber bottoms. Are comfortable and fit you well. Are closed at the toe. Do not wear sandals. If you use a stepladder: Make sure that it is fully opened. Do not climb a closed stepladder. Make sure that both sides of the stepladder are locked into place. Ask someone to hold it for you, if possible. Clearly mark and make sure that you can see: Any grab bars or handrails. First and last steps. Where the edge of each step is. Use tools that help you  move around (mobility aids) if they are needed. These include: Canes. Walkers. Scooters. Crutches. Turn on the lights when you go into a dark area. Replace any light bulbs as soon as they burn out. Set up your furniture so you have a clear path. Avoid moving your furniture around. If any of your floors are uneven, fix them. If there are any pets around you, be aware of where they are. Review your medicines with your doctor. Some medicines can make you feel dizzy. This can increase your chance of falling. Ask your doctor what other things that you can do to help prevent falls. This information is not intended to replace advice given to you by your health care provider. Make sure you discuss any questions you have with your health care provider. Document Released: 08/23/2009 Document Revised: 04/03/2016 Document Reviewed: 12/01/2014 Elsevier Interactive Patient Education  2017 [employer].

## 2021-12-02 ENCOUNTER — Other Ambulatory Visit: Payer: Self-pay | Admitting: Nurse Practitioner

## 2021-12-02 NOTE — Telephone Encounter (Signed)
Requested medication (s) are due for refill today: yes  Requested medication (s) are on the active medication list: yes  Last refill:  03/25/21 #60/4  Future visit scheduled: yes  Notes to clinic:  Unable to refill per protocol, cannot delegate.      Requested Prescriptions  Pending Prescriptions Disp Refills   tiZANidine (ZANAFLEX) 4 MG tablet [Pharmacy Med Name: TIZANIDINE HCL 4 MG TAB] 60 tablet 4    Sig: TAKE 1 TABLET BY MOUTH EVERY 6 HOURS AS NEEDED FOR MUSCLE SPASMS     Not Delegated - Cardiovascular:  Alpha-2 Agonists - tizanidine Failed - 12/02/2021 11:09 AM      Failed - This refill cannot be delegated      Passed - Valid encounter within last 6 months    Recent Outpatient Visits           3 months ago Schizophrenia, unspecified type ([place])   [place] Cannady, Jolene T, NP   8 months ago Centrilobular emphysema ([place])   [place], Jolene T, NP   11 months ago Schizophrenia, unspecified type ([place])   [place], Henrine Screws T, NP   1 year ago Abnormal weight loss   [place], [place] T, NP   1 year ago Idiopathic thrombocytopenic purpura ([place])   [place], Barbaraann Faster, NP       Future Appointments             In 2 months Cannady, Barbaraann Faster, NP [facility]

## 2021-12-09 ENCOUNTER — Other Ambulatory Visit: Payer: Self-pay | Admitting: Nurse Practitioner

## 2021-12-09 NOTE — Telephone Encounter (Signed)
Dose changed. Medication discontinued 12/18/20. Requested Prescriptions  Refused Prescriptions Disp Refills   mirtazapine (REMERON) 7.5 MG tablet [Pharmacy Med Name: MIRTAZAPINE 7.5 MG TAB] 90 tablet 3    Sig: TAKE ONE TABLET BY MOUTH AT BEDTIME     Psychiatry: Antidepressants - mirtazapine Passed - 12/09/2021 10:55 AM      Passed - AST in normal range and within 360 days    AST  Date Value Ref Range Status  08/27/2021 10 0 - 40 IU/L Final         Passed - ALT in normal range and within 360 days    ALT  Date Value Ref Range Status  08/27/2021 4 0 - 32 IU/L Final         Passed - Triglycerides in normal range and within 360 days    Triglycerides  Date Value Ref Range Status  08/27/2021 139 0 - 149 mg/dL Final         Passed - Total Cholesterol in normal range and within 360 days    Cholesterol, Total  Date Value Ref Range Status  08/27/2021 179 100 - 199 mg/dL Final         Passed - WBC in normal range and within 360 days    WBC  Date Value Ref Range Status  08/27/2021 5.0 3.4 - 10.8 x10E3/uL Final  08/31/2020 6.4 4.0 - 10.5 K/uL Final         Passed - Valid encounter within last 6 months    Recent Outpatient Visits          3 months ago Schizophrenia, unspecified type ([place])   [place], Jolene T, NP   8 months ago Centrilobular emphysema ([place])   [place], Jolene T, NP   11 months ago Schizophrenia, unspecified type ([place])   [place], Jolene T, NP   1 year ago Abnormal weight loss   [place], [place] T, NP   1 year ago Idiopathic thrombocytopenic purpura ([place])   [place], Barbaraann Faster, NP      Future Appointments            In 2 months Cannady, Barbaraann Faster, NP [facility]

## 2021-12-10 ENCOUNTER — Other Ambulatory Visit: Payer: Self-pay | Admitting: Nurse Practitioner

## 2021-12-10 DIAGNOSIS — I10 Essential (primary) hypertension: Secondary | ICD-10-CM

## 2021-12-10 DIAGNOSIS — E7800 Pure hypercholesterolemia, unspecified: Secondary | ICD-10-CM

## 2021-12-10 NOTE — Telephone Encounter (Signed)
Discontinued 12/18/20. Change in dosage.  Requested Prescriptions  Pending Prescriptions Disp Refills   mirtazapine (REMERON) 7.5 MG tablet [Pharmacy Med Name: MIRTAZAPINE 7.5 MG TAB] 90 tablet 3    Sig: TAKE ONE TABLET BY MOUTH AT BEDTIME     Psychiatry: Antidepressants - mirtazapine Passed - 12/10/2021  9:13 AM      Passed - AST in normal range and within 360 days    AST  Date Value Ref Range Status  08/27/2021 10 0 - 40 IU/L Final         Passed - ALT in normal range and within 360 days    ALT  Date Value Ref Range Status  08/27/2021 4 0 - 32 IU/L Final         Passed - Triglycerides in normal range and within 360 days    Triglycerides  Date Value Ref Range Status  08/27/2021 139 0 - 149 mg/dL Final         Passed - Total Cholesterol in normal range and within 360 days    Cholesterol, Total  Date Value Ref Range Status  08/27/2021 179 100 - 199 mg/dL Final         Passed - WBC in normal range and within 360 days    WBC  Date Value Ref Range Status  08/27/2021 5.0 3.4 - 10.8 x10E3/uL Final  08/31/2020 6.4 4.0 - 10.5 K/uL Final         Passed - Valid encounter within last 6 months    Recent Outpatient Visits          3 months ago Schizophrenia, unspecified type ([place])   [place], Jolene T, NP   8 months ago Centrilobular emphysema ([place])   [place], Jolene T, NP   11 months ago Schizophrenia, unspecified type ([place])   [place], Jolene T, NP   1 year ago Abnormal weight loss   [place], Reeves T, NP   1 year ago Idiopathic thrombocytopenic purpura ([place])   [place], Barbaraann Faster, NP      Future Appointments            In 2 months Cannady, Barbaraann Faster, NP [facility]

## 2022-01-01 ENCOUNTER — Ambulatory Visit: Payer: Self-pay | Admitting: *Deleted

## 2022-01-01 MED ORDER — MIRTAZAPINE 15 MG PO TABS: 7.5000 mg | ORAL_TABLET | Freq: Every day | ORAL | 4 refills | Status: DC

## 2022-01-01 NOTE — Telephone Encounter (Signed)
Spoke with patient and made her aware of Jolene's recommendations. Patient verbalized understanding and has no further questions at this time.

## 2022-01-01 NOTE — Telephone Encounter (Signed)
Per agent:  "Patient called in needing to know why she was taken off a medication that gave her an appetite by Marnee Guarneri. She stated that she have not had an appetite in 2 days and need her medication back but could not tell me the name of the medicine. Please advise and call patient at Ph# [phone]"  Pt unsure of med. LAst OV 08/27/21. Unable to find any additional information related to med. Please advise.           Reason for Disposition  [1] Caller has NON-URGENT medicine question about med that PCP prescribed AND [2] triager unable to answer question  Answer Assessment - Initial Assessment Questions 1. NAME of MEDICATION: "What medicine are you calling about?"     Unsure of name 2. QUESTION: "What is your question?" (e.g., double dose of medicine, side effect)     "Why was I taken off the medicine that helps with my appetite?"  Protocols used: Medication Question Call-A-AH

## 2022-01-01 NOTE — Addendum Note (Signed)
Addended by: Marnee Guarneri T on: 01/01/2022 09:06 AM   Modules accepted: Orders

## 2022-01-08 ENCOUNTER — Telehealth: Payer: Self-pay

## 2022-01-08 NOTE — Telephone Encounter (Signed)
Called patient to reschedule procedure. Informed by endo unit patient does not have transportation for tomorrow. LVM to contact office back. ?

## 2022-01-09 ENCOUNTER — Telehealth: Payer: Self-pay

## 2022-01-09 ENCOUNTER — Encounter: Admission: RE | Payer: Self-pay | Source: Home / Self Care

## 2022-01-09 ENCOUNTER — Other Ambulatory Visit: Payer: Self-pay

## 2022-01-09 ENCOUNTER — Ambulatory Visit: Admission: RE | Admit: 2022-01-09 | Payer: Medicare Other | Source: Home / Self Care | Admitting: Gastroenterology

## 2022-01-09 DIAGNOSIS — Z1211 Encounter for screening for malignant neoplasm of colon: Secondary | ICD-10-CM

## 2022-01-09 SURGERY — COLONOSCOPY WITH PROPOFOL
Anesthesia: General

## 2022-01-09 MED ORDER — NA SULFATE-K SULFATE-MG SULF 17.5-3.13-1.6 GM/177ML PO SOLN: 1.0000 | Freq: Once | ORAL | 0 refills | Status: AC

## 2022-01-09 NOTE — Telephone Encounter (Signed)
Called patient to reschedule procedure procedure had to put in new order sent new refferal to [facility] sent new letters to patient  ?

## 2022-01-28 ENCOUNTER — Telehealth: Payer: Self-pay

## 2022-01-28 ENCOUNTER — Ambulatory Visit (INDEPENDENT_AMBULATORY_CARE_PROVIDER_SITE_OTHER): Payer: Medicare Other

## 2022-01-28 ENCOUNTER — Telehealth: Payer: Commercial Managed Care - HMO

## 2022-01-28 DIAGNOSIS — F209 Schizophrenia, unspecified: Secondary | ICD-10-CM

## 2022-01-28 DIAGNOSIS — E7800 Pure hypercholesterolemia, unspecified: Secondary | ICD-10-CM

## 2022-01-28 DIAGNOSIS — D472 Monoclonal gammopathy: Secondary | ICD-10-CM

## 2022-01-28 DIAGNOSIS — N1832 Chronic kidney disease, stage 3b: Secondary | ICD-10-CM

## 2022-01-28 DIAGNOSIS — G8929 Other chronic pain: Secondary | ICD-10-CM

## 2022-01-28 DIAGNOSIS — I10 Essential (primary) hypertension: Secondary | ICD-10-CM

## 2022-01-28 NOTE — Telephone Encounter (Signed)
?  Care Management  ? ?Follow Up Note ? ? ?01/28/2022 ?Name: CONOR FILSAIME MRN: [id number] DOB: 15-Jul-1951 ? ? ?Referred by: Venita Lick, NP ?Reason for referral : Chronic Care Management (RNCM: Follow up for Chronic Disease Management and Care Coordination Needs ) ? ? ?An unsuccessful telephone outreach was attempted today. The patient was referred to the case management team for assistance with care management and care coordination.  ? ?Follow Up Plan: A HIPPA compliant phone message was left for the patient providing contact information and requesting a return call.  ? ?Noreene Larsson RN, MSN, CCM ?Community Care Coordinator ?[facility] ?[place] ?Mobile: [phone]  ?

## 2022-01-28 NOTE — Patient Instructions (Signed)
Visit Information ? ?Thank you for taking time to visit with me today. Please don't hesitate to contact me if I can be of assistance to you before our next scheduled telephone appointment. ? ?Following are the goals we discussed today:  ?RNCM Clinical Goal(s):  ?Patient will verbalize basic understanding of HTN, HLD, CKD Stage Schizophrenia , and MGUS, Chronic pain,  disease process and self health management plan as evidenced by keeping appointments, following plan of care and taking medications as directed  ?take all medications exactly as prescribed and will call provider for medication related questions as evidenced by compliance    ?attend all scheduled medical appointments: 10-17-2021 at 1 pm with oncology and 10-22-2021 at 130 pm with nephrology as evidenced by keeping appointments and calling if she cannot make the appointment. 11-26-2021: The patient missed both appointments. The patient states she had established transportation but they cancelled on her. She did not call and cancel appointments. The patient has a long standing history of non-compliance with follow up visits. The patient needs a rescheduled appointment with pcp in April. [place] office to call and schedule appointment with the patient.  01-28-2022: The patient states that she has transportation set up for her upcoming appointments. She states that she has a mammogram on 4-4 and pcp on 4-21 and colonoscopy on 5-16. She states that her sister is going to take her to her appointments. Will continue to monitor for changes.       ?demonstrate improved and ongoing adherence to prescribed treatment plan for HTN, HLD, CKD Stage 3, and Schizophrenia and MGUS, and chronic pain  as evidenced by working with the CCM team to optimize plan of care for effective management of health and well being  ?continue to work with Consulting civil engineer and/or Social Worker to address care management and care coordination needs related to HTN, HLD, CKD Stage 3, and  Schizophrenia and MGUS, and chronic pain, as evidenced by adherence to CM Team Scheduled appointments     ?demonstrate ongoing self health care management ability for effective management of chronic conditions  as evidenced by  working with the CCM team through collaboration with Consulting civil engineer, provider, and care team.  ?  ?Interventions: ?1:1 collaboration with primary care provider regarding development and update of comprehensive plan of care as evidenced by provider attestation and co-signature ?Inter-disciplinary care team collaboration (see longitudinal plan of care) ?Evaluation of current treatment plan related to  self management and patient's adherence to plan as established by provider ?  ?  ?Chronic Kidney Disease (Status: Goal on Track (progressing): YES.)  Long Term Goal  ?Last practice recorded BP readings:  ?   ?BP Readings from Last 3 Encounters:  ?08/27/21 129/83  ?03/25/21 128/82  ?12/18/20 116/79  ?Most recent eGFR/CrCl:  ?     ?Lab Results  ?Component Value Date  ?  EGFR 34 (L) 08/27/2021  ?  No components found for: CRCL ?  ?Assessed the patient understanding of chronic kidney disease    ?Evaluation of current treatment plan related to chronic kidney disease self management and patient's adherence to plan as established by provider. 01-28-2022: Review of CKD today. The patient denies any new concerns with CKD.       ?Provided education to patient re: stroke prevention, s/s of heart attack and stroke    ?Reviewed prescribed diet heart healthy/Renal. 01-28-2022: The patient states she is not losing weight anymore and she is maintaining her weight. She was eating at the time of  the call. She says her appetite is good.  ?Reviewed medications with patient and discussed importance of compliance. 01-28-2022: The patient states compliance with medications    ?Counseled on adverse effects of illicit drug and excessive alcohol use in patients with chronic kidney disease. 11-26-2021: The patient denies any  alcohol use   ?Discussed complications of poorly controlled blood pressure such as heart disease, stroke, circulatory complications, vision complications, kidney impairment, sexual dysfunction    ?Reviewed scheduled/upcoming provider appointments including: Office to call and schedule an appointment with the patient for follow up in April with pcp.  ?Advised patient to discuss changes in kidney function  with provider    ?Discussed plans with patient for ongoing care management follow up and provided patient with direct contact information for care management team    ?Screening for signs and symptoms of depression related to chronic disease state      ?Discussed the impact of chronic kidney disease on daily life and mental health and acknowledged and normalized feelings of disempowerment, fear, and frustration    ?Assessed social determinant of health barriers    ?Provided education on kidney disease progression    ?Engage patient in early, proactive and ongoing discussion about goals of care and what matters most to them    ?Support coping and stress management by recognizing current strategies and assist in developing new strategies such as mindfulness, journaling, relaxation techniques, problem-solving    ?Assisted the patient with making an appointment for follow up with the nephrologist on 10-22-2021 at 130 pm. The patient is going to arrange transportation with the "wagon". The patient able to tell RNCM her appointment and promised she would make it. 11-26-2021: The patient missed her appointment with nephrologist on 10-22-2021. The patient states she did not know she had to renew her transportation benefits. Education given. The RNCM has rescheduled this appointment multiple times. She did not call and cancel her appointment. The patient states she feels fine and she will talk to pcp about it at her next visit in April. ?  ?  ?MGUS- monoclonal gammopathy of unknown significance   (Status: Goal on Track  (progressing): YES.) Long Term Goal  ?Evaluation of current treatment plan related to  MGUS , Limited social support, Transportation, and Mental Health Concerns  self-management and patient's adherence to plan as established by provider. 11-26-2021: The patient did not make her appointment on 10-17-2021. The patient states that her transportation cancelled on her and she did not call and cancel the appointment. The patient has a long standing history of "no shows" to appointments. The patient feels she is doing well and denies any new concerns. 01-28-2022: The patient feels her condition is stable. She has still not went to see the oncologist. She denies any new concerns. Will discuss with pcp in April.  ?Discussed plans with patient for ongoing care management follow up and provided patient with direct contact information for care management team ?Advised patient to keep appointments, and follow the recommendations of the provider. Pcp appointment 02-28-2022 ?Provided education to patient re: the importance of follow up with oncologist due to low platelet count of 53. The patient verbalized understanding of the importance of keeping her appointment ; ?Reviewed medications with patient and discussed states compliance with medications ; ?Reviewed scheduled/upcoming provider appointments including : 02-28-2022 with pcp ?Discussed plans with patient for ongoing care management follow up and provided patient with direct contact information for care management team; ?Advised patient to discuss changes in platelets, and why  they continue to decrease with provider; ?Screening for signs and symptoms of depression related to chronic disease state;  ?Assessed social determinant of health barriers;   ?  ?  ?Schizophrenia   (Status: Goal on Track (progressing): YES.) Long Term Goal  ?Evaluation of current treatment plan related to  Schizophrenia , Limited social support, Transportation, Level of care concerns, Mental Health Concerns  , and Social Isolation self-management and patient's adherence to plan as established by provider. 01-28-2022: The patient states she is doing well. She is sleeping well and feels rested. She is having some pain in her

## 2022-01-28 NOTE — Chronic Care Management (AMB) (Signed)
?Chronic Care Management  ? ?CCM RN Visit Note ? ?01/28/2022 ?Name: Denise Everett MRN: [id number] DOB: [birth year] ? ?Subjective: ?Denise Everett is a 71 y.o. year old female who is a primary care patient of Cannady, Barbaraann Faster, NP. The care management team was consulted for assistance with disease management and care coordination needs.   ? ?Engaged with patient by telephone for follow up visit in response to provider referral for case management and/or care coordination services.  ? ?Consent to Services:  ?The patient was given information about Chronic Care Management services, agreed to services, and gave verbal consent prior to initiation of services.  Please see initial visit note for detailed documentation.  ? ?Patient agreed to services and verbal consent obtained.  ? ?Assessment: Review of patient past medical history, allergies, medications, health status, including review of consultants reports, laboratory and other test data, was performed as part of comprehensive evaluation and provision of chronic care management services.  ? ?SDOH (Social Determinants of Health) assessments and interventions performed:   ? ?CCM Care Plan ? ?Allergies  ?Allergen Reactions  ? No Known Allergies   ? ? ?Outpatient Encounter Medications as of 01/28/2022  ?Medication Sig  ? tiZANidine (ZANAFLEX) 4 MG tablet TAKE 1 TABLET BY MOUTH EVERY 6 HOURS AS NEEDED FOR MUSCLE SPASMS  ? atorvastatin (LIPITOR) 10 MG tablet TAKE 1 TABLET BY MOUTH DAILY AT 6 PM  ? diclofenac Sodium (VOLTAREN) 1 % GEL APPLY 4 GRAMS TOPICALLY 4 TIMES DAILY  ? famotidine (PEPCID) 20 MG tablet Take 1 tablet (20 mg total) by mouth daily.  ? gabapentin (NEURONTIN) 300 MG capsule Take 1 capsule (300 mg total) by mouth 2 (two) times daily.  ? lisinopril (ZESTRIL) 5 MG tablet Take 1 tablet (5 mg total) by mouth daily.  ? mirtazapine (REMERON) 15 MG tablet Take 0.5 tablets (7.5 mg total) by mouth at bedtime.  ? ondansetron (ZOFRAN ODT) 4 MG disintegrating tablet Take 1  tablet (4 mg total) by mouth every 8 (eight) hours as needed for nausea or vomiting.  ? raloxifene (EVISTA) 60 MG tablet Take 1 tablet (60 mg total) by mouth daily.  ? risperiDONE (RISPERDAL) 2 MG tablet Take 0.5-1 tablets (1-2 mg total) by mouth at bedtime.  ? ?No facility-administered encounter medications on file as of 01/28/2022.  ? ? ?Patient Active Problem List  ? Diagnosis Date Noted  ? Hematuria 03/25/2021  ? Aortic atherosclerosis ([place]) 10/20/2020  ? Centrilobular emphysema ([place]) 10/20/2020  ? CKD (chronic kidney disease) stage 3, GFR 30-59 ml/min (HCC) 02/29/2020  ? Osteoarthritis of lumbar spine 04/15/2017  ? Lumbar facet arthropathy 04/15/2017  ? Lumbar facet syndrome (Bilateral) (R>L) 04/15/2017  ? Vitamin D insufficiency 03/18/2017  ? Marijuana use 03/03/2017  ? Chronic pain syndrome 02/24/2017  ? Chronic ankle pain  (Location of Tertiary source of pain) (Bilateral) (R>L) 02/24/2017  ? Abnormal MRI, lumbar spine (06/22/2014) 02/24/2017  ? Lumbar central spinal stenosis (Severe at L3-4) 02/24/2017  ? Lumbar foraminal stenosis (Severe: Left L3-4; Right L4-5; Bilateral L5-S1) 02/24/2017  ? Lumbar lateral recess stenosis (Severe: Left L3-4; Right L4-5; Left L5-S1) 02/24/2017  ? Personal history of tobacco use, presenting hazards to health 02/03/2017  ? Advance care planning 10/08/2016  ? MGUS (monoclonal gammopathy of unknown significance) 06/23/2016  ? Schizophrenia ([place]) 06/07/2015  ? Osteoporosis 06/07/2015  ? Chronic low back pain (Location of Primary Source of Pain) (Bilateral) (R>L) 06/07/2015  ? Idiopathic thrombocytopenic purpura ([place]) 06/07/2015  ? Allergic rhinitis 06/07/2015  ?  Chronic leg pain 06/07/2015  ? Hyperlipidemia 06/07/2015  ? Hypertension 06/07/2015  ? Lumbar IVDD (intervertebral disc displacement) 09/29/2014  ? Neuritis or radiculitis due to rupture of lumbar intervertebral disc 09/29/2014  ? Chronic Lumbar radiculitis 09/29/2014  ? Lumbar HNP (herniated nucleus pulposus) 09/29/2014   ? ? ?Conditions to be addressed/monitored:HTN, HLD, CKD Stage 3, and MGUS, Schizophrenia, and chronic pain ? ?Care Plan : RNCM: General Plan of Care (Adult) for Chronic Disease Management and Care Coordination Needs  ?Updates made by Vanita Ingles, RN since 01/28/2022 12:00 AM  ?  ? ?Problem: RNCM; Development of Plan of Care for Chronic Disease Management (HTN, HLD, CKD3, MGUS, and Schizophrenia, Chronic Pain )   ?Priority: High  ?  ? ?Long-Range Goal: RNCM: Effective Management of Plan of Care for Chronic Disease Management (HTN, HLD, CKD3, MGUS, and Schizophrenia, Chronic pain)   ?Start Date: 09/24/2021  ?Expected End Date: 09/24/2022  ?Priority: High  ?Note:   ?Current Barriers:  ?Knowledge Deficits related to plan of care for management of HTN, HLD, CKD Stage 3, and Schizophrenia and MGUS, Chronic Pain ?Care Coordination needs related to Limited social support, Transportation, Mental Health Concerns , Social Isolation, and Literacy concerns  ?Chronic Disease Management support and education needs related to HTN, HLD, CKD Stage 3, and Shizophrenia and MGUS, Chronic pain  ?Lacks caregiver support.        ?Literacy barriers ?Transportation barriers- 11-26-2021- has missed several appointments. States she sets up transportation but then transportation tells her she has to renew. Have had issues with transportation for a long time. Review and education done. 01-28-2022: Review of upcoming appointments and transportation needs. The patient states she has secured transportation and her sister is going with her to her appointments ?Non-adherence to scheduled provider appointments ?Non-adherence to prescribed medication regimen ? ?RNCM Clinical Goal(s):  ?Patient will verbalize basic understanding of HTN, HLD, CKD Stage Schizophrenia , and MGUS, Chronic pain,  disease process and self health management plan as evidenced by keeping appointments, following plan of care and taking medications as directed  ?take all  medications exactly as prescribed and will call provider for medication related questions as evidenced by compliance    ?attend all scheduled medical appointments: 10-17-2021 at 1 pm with oncology and 10-22-2021 at 130 pm with nephrology as evidenced by keeping appointments and calling if she cannot make the appointment. 11-26-2021: The patient missed both appointments. The patient states she had established transportation but they cancelled on her. She did not call and cancel appointments. The patient has a long standing history of non-compliance with follow up visits. The patient needs a rescheduled appointment with pcp in April. [place] office to call and schedule appointment with the patient.  01-28-2022: The patient states that she has transportation set up for her upcoming appointments. She states that she has a mammogram on 4-4 and pcp on 4-21 and colonoscopy on 5-16. She states that her sister is going to take her to her appointments. Will continue to monitor for changes.       ?demonstrate improved and ongoing adherence to prescribed treatment plan for HTN, HLD, CKD Stage 3, and Schizophrenia and MGUS, and chronic pain  as evidenced by working with the CCM team to optimize plan of care for effective management of health and well being  ?continue to work with Consulting civil engineer and/or Social Worker to address care management and care coordination needs related to HTN, HLD, CKD Stage 3, and Schizophrenia and MGUS, and chronic pain, as  evidenced by adherence to CM Team Scheduled appointments     ?demonstrate ongoing self health care management ability for effective management of chronic conditions  as evidenced by  working with the CCM team through collaboration with RN Care manager, provider, and care team.  ? ?Interventions: ?1:1 collaboration with primary care provider regarding development and update of comprehensive plan of care as evidenced by provider attestation and co-signature ?Inter-disciplinary care team  collaboration (see longitudinal plan of care) ?Evaluation of current treatment plan related to  self management and patient's adherence to plan as established by provider ? ? ?Chronic Kidney Disease (Status: Go

## 2022-02-07 DIAGNOSIS — D472 Monoclonal gammopathy: Secondary | ICD-10-CM | POA: Diagnosis not present

## 2022-02-07 DIAGNOSIS — I129 Hypertensive chronic kidney disease with stage 1 through stage 4 chronic kidney disease, or unspecified chronic kidney disease: Secondary | ICD-10-CM | POA: Diagnosis not present

## 2022-02-07 DIAGNOSIS — E785 Hyperlipidemia, unspecified: Secondary | ICD-10-CM | POA: Diagnosis not present

## 2022-02-07 DIAGNOSIS — N183 Chronic kidney disease, stage 3 unspecified: Secondary | ICD-10-CM | POA: Diagnosis not present

## 2022-02-11 DIAGNOSIS — Z1231 Encounter for screening mammogram for malignant neoplasm of breast: Secondary | ICD-10-CM | POA: Diagnosis not present

## 2022-02-11 LAB — HM MAMMOGRAPHY

## 2022-02-25 ENCOUNTER — Ambulatory Visit: Payer: Medicare Other | Admitting: Nurse Practitioner

## 2022-02-27 NOTE — Patient Instructions (Signed)
Chronic Kidney Disease, Adult Chronic kidney disease is when lasting damage happens to the kidneys slowly over a long time. The kidneys help to: Make pee (urine). Make hormones. Keep the right amount of fluids and chemicals in the body. Most often, this disease does not go away. You must take steps to help keep the kidney damage from getting worse. If steps are not taken, the kidneys might stop working forever. What are the causes? Diabetes. High blood pressure. Diseases that affect the heart and blood vessels. Other kidney diseases. Diseases of the body's disease-fighting system. A problem with the flow of pee. Infections of the organs that make pee, store it, and take it out of the body. Swelling or irritation of your blood vessels. What increases the risk? Getting older. Having someone in your family who has kidney disease or kidney failure. Having a disease caused by genes. Taking medicines often that harm the kidneys. Being near or having contact with harmful substances. Being very overweight. Using tobacco now or in the past. What are the signs or symptoms? Feeling very tired. Having a swollen face, legs, ankles, or feet. Feeling like you may vomit or vomiting. Not feeling hungry. Being confused or not able to focus. Twitches and cramps in the leg muscles or other muscles. Dry, itchy skin. A taste of metal in your mouth. Making less pee, or making more pee. Shortness of breath. Trouble sleeping. You may also become anemic or get weak bones. Anemic means there is not enough red blood cells or hemoglobin in your blood. You may get symptoms slowly. You may not notice them until the kidney damage gets very bad. How is this treated? Often, there is no cure for this disease. Treatment can help with symptoms and help keep the disease from getting worse. You may need to: Avoid alcohol. Avoid foods that are high in salt, potassium, phosphorous, and protein. Take medicines for  symptoms and to help control other conditions. Have dialysis. This treatment gets harmful waste out of your body. Treat other problems that cause your kidney disease or make it worse. Follow these instructions at home: Medicines Take over-the-counter and prescription medicines only as told by your doctor. Do not take any new medicines, vitamins, or supplements unless your doctor says it is okay. Lifestyle  Do not smoke or use any products that contain nicotine or tobacco. If you need help quitting, ask your doctor. If you drink alcohol: Limit how much you use to: 0-1 drink a day for women who are not pregnant. 0-2 drinks a day for men. Know how much alcohol is in your drink. In the [place] one drink equals one 12 oz bottle of beer (355 mL), one 5 oz glass of wine (148 mL), or one 1 oz glass of hard liquor (44 mL). Stay at a healthy weight. If you need help losing weight, ask your doctor. General instructions  Follow instructions from your doctor about what you cannot eat or drink. Track your blood pressure at home. Tell your doctor about any changes. If you have diabetes, track your blood sugar. Exercise at least 30 minutes a day, 5 days a week. Keep your shots (vaccinations) up to date. Keep all follow-up visits. Where to find more information American Association of Kidney Patients: www.aakp.org National Kidney Foundation: www.kidney.org American Kidney Fund: www.akfinc.org Life Options: www.lifeoptions.org Kidney School: www.kidneyschool.org Contact a doctor if: Your symptoms get worse. You get new symptoms. Get help right away if: You get symptoms of end-stage kidney disease. These   include: Headaches. Losing feeling in your hands or feet. Easy bruising. Having hiccups often. Chest pain. Shortness of breath. Lack of menstrual periods, in women. You have a fever. You make less pee than normal. You have pain or you bleed when you pee or poop. These symptoms may be an  emergency. Get help right away. Call your local emergency services (911 in the [place] Do not wait to see if the symptoms will go away. Do not drive yourself to the hospital. Summary Chronic kidney disease is when lasting damage happens to the kidneys slowly over a long time. Causes of this disease include diabetes and high blood pressure. Often, there is no cure for this disease. Treatment can help symptoms and help keep the disease from getting worse. Treatment may involve lifestyle changes, medicines, and dialysis. This information is not intended to replace advice given to you by your health care provider. Make sure you discuss any questions you have with your health care provider. Document Revised: 02/01/2020 Document Reviewed: 02/01/2020 Elsevier Patient Education  2023 [employer]  

## 2022-02-28 ENCOUNTER — Ambulatory Visit (INDEPENDENT_AMBULATORY_CARE_PROVIDER_SITE_OTHER): Payer: Medicare Other | Admitting: Nurse Practitioner

## 2022-02-28 ENCOUNTER — Encounter: Payer: Self-pay | Admitting: Nurse Practitioner

## 2022-02-28 VITALS — BP 104/69 | HR 65 | Temp 98.7°F | Ht 59.5 in | Wt 133.6 lb

## 2022-02-28 DIAGNOSIS — J432 Centrilobular emphysema: Secondary | ICD-10-CM

## 2022-02-28 DIAGNOSIS — D693 Immune thrombocytopenic purpura: Secondary | ICD-10-CM | POA: Diagnosis not present

## 2022-02-28 DIAGNOSIS — R8281 Pyuria: Secondary | ICD-10-CM | POA: Diagnosis not present

## 2022-02-28 DIAGNOSIS — I700 Atherosclerosis of aorta: Secondary | ICD-10-CM

## 2022-02-28 DIAGNOSIS — D472 Monoclonal gammopathy: Secondary | ICD-10-CM | POA: Diagnosis not present

## 2022-02-28 DIAGNOSIS — R3121 Asymptomatic microscopic hematuria: Secondary | ICD-10-CM

## 2022-02-28 DIAGNOSIS — I10 Essential (primary) hypertension: Secondary | ICD-10-CM

## 2022-02-28 DIAGNOSIS — N1832 Chronic kidney disease, stage 3b: Secondary | ICD-10-CM

## 2022-02-28 DIAGNOSIS — E7800 Pure hypercholesterolemia, unspecified: Secondary | ICD-10-CM | POA: Diagnosis not present

## 2022-02-28 DIAGNOSIS — E559 Vitamin D deficiency, unspecified: Secondary | ICD-10-CM

## 2022-02-28 DIAGNOSIS — M816 Localized osteoporosis [Lequesne]: Secondary | ICD-10-CM

## 2022-02-28 DIAGNOSIS — F209 Schizophrenia, unspecified: Secondary | ICD-10-CM

## 2022-02-28 LAB — MICROALBUMIN, URINE WAIVED
Creatinine, Urine Waived: 50 mg/dL (ref 10–300)
Microalb, Ur Waived: 10 mg/L (ref 0–19)

## 2022-02-28 LAB — URINALYSIS, ROUTINE W REFLEX MICROSCOPIC
Bilirubin, UA: NEGATIVE
Glucose, UA: NEGATIVE
Ketones, UA: NEGATIVE
Nitrite, UA: NEGATIVE
Protein,UA: NEGATIVE
Specific Gravity, UA: 1.015 (ref 1.005–1.030)
Urobilinogen, Ur: 0.2 mg/dL (ref 0.2–1.0)
pH, UA: 5.5 (ref 5.0–7.5)

## 2022-02-28 LAB — MICROSCOPIC EXAMINATION

## 2022-02-28 NOTE — Assessment & Plan Note (Signed)
Noted on CT 02/03/2017.  Is a former smoker, reports quitting 4 years ago.  No current inhalers.  Would benefit from spirometry in future, refuses today.  Has repeat lung CT ordered, but has not attended or scheduled this yet -- highly recommend she scheduled this as soon as possible -- she missed scheduled scan last year.  Will continue to work closely with CCM team on assist. ?

## 2022-02-28 NOTE — Assessment & Plan Note (Signed)
Continue to collaborate with hematology, recent note reviewed.  She denies bleeding or increased bruising.  ?

## 2022-02-28 NOTE — Assessment & Plan Note (Signed)
History of low levels, recheck today and adjust supplement as needed. 

## 2022-02-28 NOTE — Assessment & Plan Note (Signed)
Chronic, ongoing.  Continue current medication regimen and adjust as needed. Lipid panel today. 

## 2022-02-28 NOTE — Assessment & Plan Note (Signed)
Chronic, ongoing.  Continue current medication regimen and collaboration with nephrology.  CCM collaboration continues.  Obtain CMP and CBC and urine ALB today.  Highly recommend she get scheduled with nephrology for ongoing continuity of care. ?

## 2022-02-28 NOTE — Assessment & Plan Note (Signed)
Chronic, ongoing with BP at goal today for age. Continue current medication regimen and adjust as needed.  Recommend she check BP at home at least 3 mornings a week and document.  DASH diet focus.  If ongoing lower reading next visit, consider discontinuation or reduction to 2.5 MG of Lisinopril.  LABS: CMP, CBC, urine ALB, TSH.  Return in 6 months. ?

## 2022-02-28 NOTE — Assessment & Plan Note (Signed)
Chronic, ongoing.  Continue current medication regimen, Risperdal, and adjust as needed. Has been on this regimen for years with benefit.  Does have underlying paranoia at baseline.  Refills up to date.  Prolactin level today. 

## 2022-02-28 NOTE — Progress Notes (Signed)
? ?BP 104/69   Pulse 65   Temp 98.7 ?F (37.1 ?C) (Oral)   Ht 4' 11.5" (1.511 m)   Wt 133 lb 9.6 oz (60.6 kg)   LMP  (LMP Unknown)   SpO2 98%   BMI 26.53 kg/m?   ? ?Subjective:  ? ? Patient ID: Denise Everett, female    DOB: 29-Jan-1951, 70 y.o.   MRN: [id number] ? ?HPI: ?Denise Everett is a 71 y.o. female ? ?Chief Complaint  ?Patient presents with  ? Chronic Kidney Disease  ? Mood  ? Hyperlipidemia  ? Hypertension  ? COPD  ?  ?SCHIZOPHRENIA/MGUS/THROMBOCYTOPENIA ?Continues on Mirtazapine and Risperdal for mood. Takes Pepcid daily for GERD, this offers benefit. ? ?Has not had colonoscopy, she is now scheduled for 03/25/22. She saw GI on 05/01/20 last. Have been working with CCM team on assistance with this.  She denies night sweats, fever, decreased appetite at this time. IFOBT negative 02/16/20.  ? ?Followed by Dr. Grayland Ormond for MGUS and ITP, did see for follow-up on 01/18/21 and was to return in 6 months -- last CBC in October were 53--  but missed appointment on review due to no transportation. ?Fever: no ?Nausea: no ?Vomiting: none ?Weight loss: yes ?Decreased appetite: yes ?Diarrhea: no ?Constipation: no ?Blood in stool: no ?Heartburn: yes ?Jaundice: no ?Rash: no ?Dysuria/urinary frequency: no ?Hematuria: no ?History of sexually transmitted disease: no ?Recurrent NSAID use: no  ? ?  11/27/2021  ? 12:18 PM 03/25/2021  ?  1:13 PM 12/18/2020  ?  3:04 PM 11/19/2020  ? 10:44 AM 11/06/2020  ?  3:56 PM  ?Depression screen PHQ 2/9  ?Decreased Interest 0 3 0 0 2  ?Down, Depressed, Hopeless 0 0 0 0 0  ?PHQ - 2 Score 0 3 0 0 2  ?Altered sleeping  0 0 2 0  ?Tired, decreased energy  0 0 1 0  ?Change in appetite  2 0 3 2  ?Feeling bad or failure about yourself   0 0 0 2  ?Trouble concentrating  0 0 0 0  ?Moving slowly or fidgety/restless  3 0 0 2  ?Suicidal thoughts  0     ?PHQ-9 Score  8 0 6 8  ?Difficult doing work/chores  Very difficult     ? ?AIMS:  ?Facial and Oral Movements  ?Muscles of Facial Expression: Normal ?Lips and  Perioral Area: None, normal  ?Jaw: None, normal  ?Tongue: None, normal ?Extremity Movements: none ?Upper (arms, wrists, hands, fingers): None, normal  ?Lower (legs, knees, ankles, toes): None, normal,  ?Trunk Movements: none ?Neck, shoulders, hips: None, normal,  ?Overall Severity : 0 ?Severity of abnormal movements (highest score from questions above): None, normal  ?Incapacitation due to abnormal movements: None, normal  ?Patient's awareness of abnormal movements (rate only patient's report): No Awareness ?Dental Status  ?Current problems with teeth and/or dentures?: No  ?Does patient usually wear dentures?: No   ? ?HYPERTENSION / HYPERLIPIDEMIA ?Continues on Lisinopril 5 MG daily and Atorvastatin 10 MG daily.  Taking Evista for Osteoporosis and no recent DEXA noted in Epic, patient reports no transportation to attend this, although CCM has been working to assist. ?Satisfied with current treatment? yes ?Duration of hypertension: chronic ?BP monitoring frequency: not checking ?BP range:  ?BP medication side effects: no ?Duration of hyperlipidemia: chronic ?Cholesterol medication side effects: no ?Cholesterol supplements: none ?Medication compliance: fair compliance ?Aspirin: no ?Recent stressors: no ?Recurrent headaches: no ?Visual changes: no ?Palpitations: no ?Dyspnea: no ?Chest pain: no ?  Lower extremity edema: no ?Dizzy/lightheaded: no  ? ?CHRONIC KIDNEY DISEASE ?Last saw nephrology on 12/25/20.  Reports she has tried to get in touch with kidney doctor, but can not get in touch with them. ?CKD status: stable ?Medications renally dose: yes ?Previous renal evaluation: yes ?Pneumovax:  Up to Date ?Influenza Vaccine:  Up to Date  ? ?COPD ?History of smoking, started at 13 and quit 5 years ago. Had initial lung CT screening in 2018 which showed emphysema & aortic atherosclerosis and was to repeat in 12 months, missed screening due to transportation issues.  No current inhalers. ?COPD status: stable ?Satisfied with  current treatment?: yes ?Oxygen use: no ?Dyspnea frequency: none ?Cough frequency: none ?Rescue inhaler frequency:  none ?Limitation of activity: no ?Productive cough: none ?Last Spirometry: unknown --refuses today ?Pneumovax: Up to Date ?Influenza: Up to Date  ? ?Relevant past medical, surgical, family and social history reviewed and updated as indicated. Interim medical history since our last visit reviewed. ?Allergies and medications reviewed and updated. ? ?Review of Systems  ?Constitutional:  Negative for activity change, appetite change (improved), diaphoresis, fatigue and fever.  ?Respiratory:  Negative for cough, chest tightness, shortness of breath and wheezing.   ?Cardiovascular:  Negative for chest pain, palpitations and leg swelling.  ?Gastrointestinal:  Negative for abdominal distention, abdominal pain, blood in stool, constipation, diarrhea, nausea and vomiting.  ?Neurological: Negative.   ?Psychiatric/Behavioral:  Negative for decreased concentration, self-injury, sleep disturbance and suicidal ideas. The patient is not nervous/anxious.   ? ?Per HPI unless specifically indicated above ?   ?Objective:  ?  ?BP 104/69   Pulse 65   Temp 98.7 ?F (37.1 ?C) (Oral)   Ht 4' 11.5" (1.511 m)   Wt 133 lb 9.6 oz (60.6 kg)   LMP  (LMP Unknown)   SpO2 98%   BMI 26.53 kg/m?   ?Wt Readings from Last 3 Encounters:  ?02/28/22 133 lb 9.6 oz (60.6 kg)  ?08/27/21 132 lb 4 oz (60 kg)  ?03/25/21 125 lb 9.6 oz (57 kg)  ?  ?Physical Exam ?Vitals and nursing note reviewed.  ?Constitutional:   ?   General: She is awake. She is not in acute distress. ?   Appearance: She is well-developed and well-groomed. She is not ill-appearing.  ?HENT:  ?   Head: Normocephalic.  ?   Right Ear: Hearing normal.  ?   Left Ear: Hearing normal.  ?   Nose: Nose normal.  ?   Mouth/Throat:  ?   Mouth: Mucous membranes are moist.  ?Eyes:  ?   General: Lids are normal.     ?   Right eye: No discharge.     ?   Left eye: No discharge.  ?    Conjunctiva/sclera: Conjunctivae normal.  ?   Pupils: Pupils are equal, round, and reactive to light.  ?Neck:  ?   Thyroid: No thyromegaly.  ?   Vascular: No carotid bruit.  ?Cardiovascular:  ?   Rate and Rhythm: Normal rate and regular rhythm.  ?   Heart sounds: Normal heart sounds. No murmur heard. ?  No gallop.  ?Pulmonary:  ?   Effort: Pulmonary effort is normal. No accessory muscle usage or respiratory distress.  ?   Breath sounds: Normal breath sounds.  ?Abdominal:  ?   General: Bowel sounds are normal. There is no distension or abdominal bruit.  ?   Palpations: Abdomen is soft. There is no hepatomegaly or splenomegaly.  ?   Tenderness: There is  no abdominal tenderness.  ?   Hernia: No hernia is present.  ?Musculoskeletal:  ?   Cervical back: Normal range of motion and neck supple.  ?   Right lower leg: No edema.  ?   Left lower leg: No edema.  ?Lymphadenopathy:  ?   Head:  ?   Right side of head: No submental, submandibular, tonsillar, preauricular or posterior auricular adenopathy.  ?   Left side of head: No submental, submandibular, tonsillar, preauricular or posterior auricular adenopathy.  ?   Cervical: No cervical adenopathy.  ?Skin: ?   General: Skin is warm and dry.  ?   Findings: Bruising (x one small bruise to right upper arm.) present.  ?Neurological:  ?   Mental Status: She is alert and oriented to person, place, and time.  ?Psychiatric:     ?   Attention and Perception: Attention normal.     ?   Mood and Affect: Mood normal.     ?   Speech: Speech normal.     ?   Behavior: Behavior normal. Behavior is cooperative.  ? ?Results for orders placed or performed in visit on 02/28/22  ?Microscopic Examination  ? Urine  ?Result Value Ref Range  ? WBC, UA 0-5 0 - 5 /hpf  ? RBC 3-10 (A) 0 - 2 /hpf  ? Epithelial Cells (non renal) 0-10 0 - 10 /hpf  ? Bacteria, UA Moderate (A) None seen/Few  ?Microalbumin, Urine Waived  ?Result Value Ref Range  ? Microalb, Ur Waived 10 0 - 19 mg/L  ? Creatinine, Urine Waived  50 10 - 300 mg/dL  ? Microalb/Creat Ratio 30-300 (H) <30 mg/g  ?Urinalysis, Routine w reflex microscopic  ?Result Value Ref Range  ? Specific Gravity, UA 1.015 1.005 - 1.030  ? pH, UA 5.5 5.0 - 7.5  ? Color, UA Yellow Yellow  ?

## 2022-02-28 NOTE — Assessment & Plan Note (Addendum)
Chronic, no recent DEXA in chart -- reports she has no ride to obtain. Continue Evista daily, refills sent, and recommend Vitamin D daily.  Check level next visit. ?

## 2022-02-28 NOTE — Addendum Note (Signed)
Addended by: Marnee Guarneri T on: 02/28/2022 02:55 PM ? ? Modules accepted: Orders ? ?

## 2022-02-28 NOTE — Assessment & Plan Note (Signed)
Noted on CT imaging 08/07/20.  Continue statin for prevention.  Recommend continued cessation of smoking.  No ASA due to ITP. ?

## 2022-02-28 NOTE — Assessment & Plan Note (Signed)
Continue to collaborate with hematology, recent note reviewed.  She denies bleeding or increased bruising.  Have highly recommended not to miss appointments with them due to disease process and ongoing continuity of care. 

## 2022-02-28 NOTE — Assessment & Plan Note (Signed)
?  kidney stones with her intermittent acute episodes of back pain.  Have ordered CT abdomen/pelvis in past, but she has missed having done and wishes not to obtain at this time.  Will continue to work with CCM team on assistance needs, patient is very difficult to get in touch with and does not always follow direction -- appears to have low support per patient report.  Recheck urine today, may benefit urology. ?

## 2022-03-01 ENCOUNTER — Encounter: Payer: Self-pay | Admitting: Nurse Practitioner

## 2022-03-01 DIAGNOSIS — R7989 Other specified abnormal findings of blood chemistry: Secondary | ICD-10-CM | POA: Insufficient documentation

## 2022-03-01 LAB — LIPID PANEL W/O CHOL/HDL RATIO
Cholesterol, Total: 184 mg/dL (ref 100–199)
HDL: 49 mg/dL (ref >39)
LDL Chol Calc (NIH): 115 mg/dL — ABNORMAL HIGH (ref 0–99)
Triglycerides: 110 mg/dL (ref 0–149)
VLDL Cholesterol Cal: 20 mg/dL (ref 5–40)

## 2022-03-01 LAB — CBC WITH DIFFERENTIAL/PLATELET
Basophils Absolute: 0 10*3/uL (ref 0.0–0.2)
Basos: 1 %
EOS (ABSOLUTE): 0.2 10*3/uL (ref 0.0–0.4)
Eos: 4 %
Hematocrit: 32.3 % — ABNORMAL LOW (ref 34.0–46.6)
Hemoglobin: 11.2 g/dL (ref 11.1–15.9)
Immature Grans (Abs): 0 10*3/uL (ref 0.0–0.1)
Immature Granulocytes: 1 %
Lymphocytes Absolute: 2.1 10*3/uL (ref 0.7–3.1)
Lymphs: 38 %
MCH: 32 pg (ref 26.6–33.0)
MCHC: 34.7 g/dL (ref 31.5–35.7)
MCV: 92 fL (ref 79–97)
Monocytes Absolute: 0.6 10*3/uL (ref 0.1–0.9)
Monocytes: 12 %
Neutrophils Absolute: 2.5 10*3/uL (ref 1.4–7.0)
Neutrophils: 44 %
Platelets: 55 10*3/uL — CL (ref 150–450)
RBC: 3.5 x10E6/uL — ABNORMAL LOW (ref 3.77–5.28)
RDW: 12.9 % (ref 11.7–15.4)
WBC: 5.4 10*3/uL (ref 3.4–10.8)

## 2022-03-01 LAB — COMPREHENSIVE METABOLIC PANEL
ALT: 11 IU/L (ref 0–32)
AST: 16 IU/L (ref 0–40)
Albumin/Globulin Ratio: 1.6 (ref 1.2–2.2)
Albumin: 4 g/dL (ref 3.7–4.7)
Alkaline Phosphatase: 64 IU/L (ref 44–121)
BUN/Creatinine Ratio: 10 — ABNORMAL LOW (ref 12–28)
BUN: 20 mg/dL (ref 8–27)
Bilirubin Total: 0.2 mg/dL (ref 0.0–1.2)
CO2: 22 mmol/L (ref 20–29)
Calcium: 9.1 mg/dL (ref 8.7–10.3)
Chloride: 102 mmol/L (ref 96–106)
Creatinine, Ser: 1.96 mg/dL — ABNORMAL HIGH (ref 0.57–1.00)
Globulin, Total: 2.5 g/dL (ref 1.5–4.5)
Glucose: 87 mg/dL (ref 70–99)
Potassium: 4.6 mmol/L (ref 3.5–5.2)
Sodium: 138 mmol/L (ref 134–144)
Total Protein: 6.5 g/dL (ref 6.0–8.5)
eGFR: 27 mL/min/{1.73_m2} — ABNORMAL LOW (ref >59)

## 2022-03-01 LAB — PROLACTIN: Prolactin: 250 ng/mL — ABNORMAL HIGH (ref 4.8–23.3)

## 2022-03-01 LAB — TSH: TSH: 0.91 u[IU]/mL (ref 0.450–4.500)

## 2022-03-01 NOTE — Progress Notes (Signed)
Good morning crew, please let patient know that labs have returned: ?- Platelet count remains very low, please ensure you follow-up with Dr. Grayland Ormond as soon as possible and maintain follow-up visits with them to monitor this. ?- Kidney function continues to show ongoing kidney disease, please ensure you schedule follow-up with kidney doctor = Dr. Holley Raring, this needs continued monitoring with them as well ?- Cholesterol levels slightly above goal, we may increase Atorvastatin next visit if remains above goal.  Please ensure you are taking medicine daily. ?- Thyroid lab is normal. ?- Prolactin level is elevated, this can be from kidney disease and Risperdal, at this time you have been on Risperdal for some time and we will maintain this since tolerating well, in future we could consider changing to another medication in same family that will not elevated Prolactin levels as much, we can discuss next visit.  Any questions? ?Keep being amazing!!  Thank you for allowing me to participate in your care.  I appreciate you. ?Kindest regards, ?Jerrin Recore ?

## 2022-03-02 LAB — URINE CULTURE: Organism ID, Bacteria: NO GROWTH

## 2022-03-07 ENCOUNTER — Telehealth: Payer: Medicare Other

## 2022-03-07 ENCOUNTER — Ambulatory Visit (INDEPENDENT_AMBULATORY_CARE_PROVIDER_SITE_OTHER): Payer: Medicare Other

## 2022-03-07 DIAGNOSIS — G8929 Other chronic pain: Secondary | ICD-10-CM

## 2022-03-07 DIAGNOSIS — N1832 Chronic kidney disease, stage 3b: Secondary | ICD-10-CM

## 2022-03-07 DIAGNOSIS — I10 Essential (primary) hypertension: Secondary | ICD-10-CM

## 2022-03-07 DIAGNOSIS — M79604 Pain in right leg: Secondary | ICD-10-CM

## 2022-03-07 DIAGNOSIS — D693 Immune thrombocytopenic purpura: Secondary | ICD-10-CM

## 2022-03-07 DIAGNOSIS — D472 Monoclonal gammopathy: Secondary | ICD-10-CM

## 2022-03-07 DIAGNOSIS — E7800 Pure hypercholesterolemia, unspecified: Secondary | ICD-10-CM

## 2022-03-07 DIAGNOSIS — F209 Schizophrenia, unspecified: Secondary | ICD-10-CM

## 2022-03-07 NOTE — Chronic Care Management (AMB) (Signed)
?Chronic Care Management  ? ?CCM RN Visit Note ? ?03/07/2022 ?Name: Denise Everett MRN: [id number] DOB: Feb 16, 1951 ? ?Subjective: ?Denise Everett is a 71 y.o. year old female who is a primary care patient of Cannady, Barbaraann Faster, NP. The care management team was consulted for assistance with disease management and care coordination needs.   ? ?Engaged with patient by telephone for follow up visit in response to provider referral for case management and/or care coordination services.  ? ?Consent to Services:  ?The patient was given information about Chronic Care Management services, agreed to services, and gave verbal consent prior to initiation of services.  Please see initial visit note for detailed documentation.  ? ?Patient agreed to services and verbal consent obtained.  ? ?Assessment: Review of patient past medical history, allergies, medications, health status, including review of consultants reports, laboratory and other test data, was performed as part of comprehensive evaluation and provision of chronic care management services.  ? ?SDOH (Social Determinants of Health) assessments and interventions performed:   ? ?CCM Care Plan ? ?Allergies  ?Allergen Reactions  ? No Known Allergies   ? ? ?Outpatient Encounter Medications as of 03/07/2022  ?Medication Sig  ? atorvastatin (LIPITOR) 10 MG tablet TAKE 1 TABLET BY MOUTH DAILY AT 6 PM  ? diclofenac Sodium (VOLTAREN) 1 % GEL APPLY 4 GRAMS TOPICALLY 4 TIMES DAILY  ? famotidine (PEPCID) 20 MG tablet Take 1 tablet (20 mg total) by mouth daily.  ? gabapentin (NEURONTIN) 300 MG capsule Take 1 capsule (300 mg total) by mouth 2 (two) times daily.  ? lisinopril (ZESTRIL) 5 MG tablet Take 1 tablet (5 mg total) by mouth daily.  ? mirtazapine (REMERON) 15 MG tablet Take 0.5 tablets (7.5 mg total) by mouth at bedtime.  ? ondansetron (ZOFRAN ODT) 4 MG disintegrating tablet Take 1 tablet (4 mg total) by mouth every 8 (eight) hours as needed for nausea or vomiting.  ? raloxifene  (EVISTA) 60 MG tablet Take 1 tablet (60 mg total) by mouth daily.  ? risperiDONE (RISPERDAL) 2 MG tablet Take 0.5-1 tablets (1-2 mg total) by mouth at bedtime.  ? tiZANidine (ZANAFLEX) 4 MG tablet TAKE 1 TABLET BY MOUTH EVERY 6 HOURS AS NEEDED FOR MUSCLE SPASMS  ? ?No facility-administered encounter medications on file as of 03/07/2022.  ? ? ?Patient Active Problem List  ? Diagnosis Date Noted  ? Elevated prolactin level 03/01/2022  ? Hematuria 03/25/2021  ? Aortic atherosclerosis ([place]) 10/20/2020  ? Centrilobular emphysema ([place]) 10/20/2020  ? CKD (chronic kidney disease) stage 3, GFR 30-59 ml/min (HCC) 02/29/2020  ? Osteoarthritis of lumbar spine 04/15/2017  ? Lumbar facet arthropathy 04/15/2017  ? Lumbar facet syndrome (Bilateral) (R>L) 04/15/2017  ? Vitamin D insufficiency 03/18/2017  ? Marijuana use 03/03/2017  ? Chronic pain syndrome 02/24/2017  ? Chronic ankle pain  (Location of Tertiary source of pain) (Bilateral) (R>L) 02/24/2017  ? Abnormal MRI, lumbar spine (06/22/2014) 02/24/2017  ? Lumbar central spinal stenosis (Severe at L3-4) 02/24/2017  ? Lumbar foraminal stenosis (Severe: Left L3-4; Right L4-5; Bilateral L5-S1) 02/24/2017  ? Lumbar lateral recess stenosis (Severe: Left L3-4; Right L4-5; Left L5-S1) 02/24/2017  ? Personal history of tobacco use, presenting hazards to health 02/03/2017  ? Advance care planning 10/08/2016  ? MGUS (monoclonal gammopathy of unknown significance) 06/23/2016  ? Schizophrenia ([place]) 06/07/2015  ? Osteoporosis 06/07/2015  ? Chronic low back pain (Location of Primary Source of Pain) (Bilateral) (R>L) 06/07/2015  ? Idiopathic thrombocytopenic purpura ([place]) 06/07/2015  ?  Allergic rhinitis 06/07/2015  ? Chronic leg pain 06/07/2015  ? Hyperlipidemia 06/07/2015  ? Hypertension 06/07/2015  ? Lumbar IVDD (intervertebral disc displacement) 09/29/2014  ? Neuritis or radiculitis due to rupture of lumbar intervertebral disc 09/29/2014  ? Chronic Lumbar radiculitis 09/29/2014  ? Lumbar HNP  (herniated nucleus pulposus) 09/29/2014  ? ? ?Conditions to be addressed/monitored:HTN, HLD, CKD Stage 3, and MGUS, Schizophrenia, and Chronic pain ? ?Care Plan : RNCM: General Plan of Care (Adult) for Chronic Disease Management and Care Coordination Needs  ?Updates made by Vanita Ingles, RN since 03/07/2022 12:00 AM  ?  ? ?Problem: RNCM; Development of Plan of Care for Chronic Disease Management (HTN, HLD, CKD3, MGUS, and Schizophrenia, Chronic Pain )   ?Priority: High  ?  ? ?Long-Range Goal: RNCM: Effective Management of Plan of Care for Chronic Disease Management (HTN, HLD, CKD3, MGUS, and Schizophrenia, Chronic pain)   ?Start Date: 09/24/2021  ?Expected End Date: 09/24/2022  ?Priority: High  ?Note:   ?Current Barriers:  ?Knowledge Deficits related to plan of care for management of HTN, HLD, CKD Stage 3, and Schizophrenia and MGUS, Chronic Pain ?Care Coordination needs related to Limited social support, Transportation, Mental Health Concerns , Social Isolation, and Literacy concerns  ?Chronic Disease Management support and education needs related to HTN, HLD, CKD Stage 3, and Shizophrenia and MGUS, Chronic pain  ?Lacks caregiver support.        ?Literacy barriers ?Transportation barriers- 11-26-2021- has missed several appointments. States she sets up transportation but then transportation tells her she has to renew. Have had issues with transportation for a long time. Review and education done. 01-28-2022: Review of upcoming appointments and transportation needs. The patient states she has secured transportation and her sister is going with her to her appointments. 03-07-2022: Review of upcoming appointment for colonoscopy and the patient states that her sister is going with her when she has her colonoscopy.  ?Non-adherence to scheduled provider appointments ?Non-adherence to prescribed medication regimen ? ?RNCM Clinical Goal(s):  ?Patient will verbalize basic understanding of HTN, HLD, CKD Stage Schizophrenia ,  and MGUS, Chronic pain,  disease process and self health management plan as evidenced by keeping appointments, following plan of care and taking medications as directed  ?take all medications exactly as prescribed and will call provider for medication related questions as evidenced by compliance    ?attend all scheduled medical appointments: 10-17-2021 at 1 pm with oncology and 10-22-2021 at 130 pm with nephrology as evidenced by keeping appointments and calling if she cannot make the appointment. 11-26-2021: The patient missed both appointments. The patient states she had established transportation but they cancelled on her. She did not call and cancel appointments. The patient has a long standing history of non-compliance with follow up visits. The patient needs a rescheduled appointment with pcp in April. [place] office to call and schedule appointment with the patient.  01-28-2022: The patient states that she has transportation set up for her upcoming appointments. She states that she has a mammogram on 4-4 and pcp on 4-21 and colonoscopy on 5-16. She states that her sister is going to take her to her appointments. Will continue to monitor for changes. 03-07-2022: The patient has an upcoming appointment for colonoscopy on 03-25-2022 and states her sister is going with her. She has a follow up with oncology on 03-18-2022, and called with the patient today and left a message for [place] to call her for an appointment. Attempted to help patient make an  appointment today but had to leave a message on voicemail.       ?demonstrate improved and ongoing adherence to prescribed treatment plan for HTN, HLD, CKD Stage 3, and Schizophrenia and MGUS, and chronic pain  as evidenced by working with the CCM team to optimize plan of care for effective management of health and well being  ?continue to work with Consulting civil engineer and/or Social Worker to address care management and care coordination needs related  to HTN, HLD, CKD Stage 3, and Schizophrenia and MGUS, and chronic pain, as evidenced by adherence to CM Team Scheduled appointments     ?demonstrate ongoing self health care management ability for effecti

## 2022-03-07 NOTE — Patient Instructions (Signed)
Visit Information ? ?Thank you for taking time to visit with me today. Please don't hesitate to contact me if I can be of assistance to you before our next scheduled telephone appointment. ? ?Following are the goals we discussed today:  ?RNCM Clinical Goal(s):  ?Patient will verbalize basic understanding of HTN, HLD, CKD Stage Schizophrenia , and MGUS, Chronic pain,  disease process and self health management plan as evidenced by keeping appointments, following plan of care and taking medications as directed  ?take all medications exactly as prescribed and will call provider for medication related questions as evidenced by compliance    ?attend all scheduled medical appointments: 10-17-2021 at 1 pm with oncology and 10-22-2021 at 130 pm with nephrology as evidenced by keeping appointments and calling if she cannot make the appointment. 11-26-2021: The patient missed both appointments. The patient states she had established transportation but they cancelled on her. She did not call and cancel appointments. The patient has a long standing history of non-compliance with follow up visits. The patient needs a rescheduled appointment with pcp in April. [place] office to call and schedule appointment with the patient.  01-28-2022: The patient states that she has transportation set up for her upcoming appointments. She states that she has a mammogram on 4-4 and pcp on 4-21 and colonoscopy on 5-16. She states that her sister is going to take her to her appointments. Will continue to monitor for changes. 03-07-2022: The patient has an upcoming appointment for colonoscopy on 03-25-2022 and states her sister is going with her. She has a follow up with oncology on 03-18-2022, and called with the patient today and left a message for [place] to call her for an appointment. Attempted to help patient make an appointment today but had to leave a message on voicemail.       ?demonstrate improved and ongoing adherence  to prescribed treatment plan for HTN, HLD, CKD Stage 3, and Schizophrenia and MGUS, and chronic pain  as evidenced by working with the CCM team to optimize plan of care for effective management of health and well being  ?continue to work with Consulting civil engineer and/or Social Worker to address care management and care coordination needs related to HTN, HLD, CKD Stage 3, and Schizophrenia and MGUS, and chronic pain, as evidenced by adherence to CM Team Scheduled appointments     ?demonstrate ongoing self health care management ability for effective management of chronic conditions  as evidenced by  working with the CCM team through collaboration with Consulting civil engineer, provider, and care team.  ?  ?Interventions: ?1:1 collaboration with primary care provider regarding development and update of comprehensive plan of care as evidenced by provider attestation and co-signature ?Inter-disciplinary care team collaboration (see longitudinal plan of care) ?Evaluation of current treatment plan related to  self management and patient's adherence to plan as established by provider ?  ?  ?Chronic Kidney Disease (Status: Goal on Track (progressing): YES.)  Long Term Goal  ?Last practice recorded BP readings:  ?   ?BP Readings from Last 3 Encounters:  ?02/28/22 104/69  ?08/27/21 129/83  ?03/25/21 128/82  ?Most recent eGFR/CrCl:  ?     ?Lab Results  ?Component Value Date  ?  EGFR 27 (L) 02/28/2022  ?  No components found for: CRCL ?  ?Assessed the patient understanding of chronic kidney disease. 03-07-2022: The patient states she is trying to call the [facility] and doesn't think she has the right number. Called  with the patient to [facility] in [place]. The office is closed. Left a detailed message with patients number and the RNCM to assist with appointment being scheduled.   ?Evaluation of current treatment plan related to chronic kidney disease self management and patient's adherence to plan as established by  provider. 03-07-2022: Review of CKD today. The patient denies any new concerns with CKD.       ?Provided education to patient re: stroke prevention, s/s of heart attack and stroke    ?Reviewed prescribed diet heart healthy/Renal. 03-07-2022: The patient states she is not losing weight anymore and she is maintaining her weight. She was eating at the time of the call. She says her appetite is good.  ?Reviewed medications with patient and discussed importance of compliance. 03-07-2022: The patient states compliance with medications    ?Counseled on adverse effects of illicit drug and excessive alcohol use in patients with chronic kidney disease. 11-26-2021: The patient denies any alcohol use   ?Discussed complications of poorly controlled blood pressure such as heart disease, stroke, circulatory complications, vision complications, kidney impairment, sexual dysfunction    ?Reviewed scheduled/upcoming provider appointments including: Office to call and schedule an appointment with the patient for follow up in April with pcp.  ?Advised patient to discuss changes in kidney function  with provider    ?Discussed plans with patient for ongoing care management follow up and provided patient with direct contact information for care management team    ?Screening for signs and symptoms of depression related to chronic disease state      ?Discussed the impact of chronic kidney disease on daily life and mental health and acknowledged and normalized feelings of disempowerment, fear, and frustration    ?Assessed social determinant of health barriers    ?Provided education on kidney disease progression    ?Engage patient in early, proactive and ongoing discussion about goals of care and what matters most to them    ?Support coping and stress management by recognizing current strategies and assist in developing new strategies such as mindfulness, journaling, relaxation techniques, problem-solving    ?Assisted the patient with making an  appointment for follow up with the nephrologist on 10-22-2021 at 130 pm. The patient is going to arrange transportation with the "wagon". The patient able to tell RNCM her appointment and promised she would make it. 11-26-2021: The patient missed her appointment with nephrologist on 10-22-2021. The patient states she did not know she had to renew her transportation benefits. Education given. The RNCM has rescheduled this appointment multiple times. She did not call and cancel her appointment. The patient states she feels fine and she will talk to pcp about it at her next visit in April. 03-07-2022: Called with the patient to [facility] [phone]. Had to leave a message for them to call the patient back for an appointment for follow up. ?  ?  ?MGUS- monoclonal gammopathy of unknown significance   (Status: Goal on Track (progressing): YES.) Long Term Goal  ?Evaluation of current treatment plan related to  MGUS , Limited social support, Transportation, and Mental Health Concerns  self-management and patient's adherence to plan as established by provider. 11-26-2021: The patient did not make her appointment on 10-17-2021. The patient states that her transportation cancelled on her and she did not call and cancel the appointment. The patient has a long standing history of "no shows" to appointments. The patient feels she is doing well and denies any new concerns. 01-28-2022: The patient  feels her condition is stable. She has still not went to see the oncologist. She denies any new concerns. Will discuss with pcp in April. 03-07-2022: The patient has low platelets and has a follow up appointment with oncology on 03-18-2022.  ?Discussed plans with patient for ongoing care management follow up and provided patient with direct contact information for care management team ?Advised patient to keep appointments, and follow the recommendations of the provider. Pcp appointment 09-01-2022 ?Provided education to patient re:  the importance of follow up with oncologist due to low platelet count of 53. The patient verbalized understanding of the importance of keeping her appointment ; ?Reviewed medications with patient and

## 2022-03-09 DIAGNOSIS — E7800 Pure hypercholesterolemia, unspecified: Secondary | ICD-10-CM | POA: Diagnosis not present

## 2022-03-09 DIAGNOSIS — I10 Essential (primary) hypertension: Secondary | ICD-10-CM

## 2022-03-18 ENCOUNTER — Inpatient Hospital Stay: Payer: Medicare Other | Admitting: Nurse Practitioner

## 2022-03-18 ENCOUNTER — Inpatient Hospital Stay: Payer: Medicare Other

## 2022-03-24 ENCOUNTER — Telehealth: Payer: Self-pay

## 2022-03-24 NOTE — Telephone Encounter (Signed)
Trish left a voicemail stating that patient canceled her colonoscopy because we did not tell her a time till 1:00pm. She said she might can reschedule  ?

## 2022-03-25 ENCOUNTER — Ambulatory Visit: Admission: RE | Admit: 2022-03-25 | Payer: Medicare Other | Source: Home / Self Care | Admitting: Gastroenterology

## 2022-03-25 ENCOUNTER — Other Ambulatory Visit: Payer: Self-pay

## 2022-03-25 ENCOUNTER — Encounter: Admission: RE | Payer: Self-pay | Source: Home / Self Care

## 2022-03-25 DIAGNOSIS — Z1211 Encounter for screening for malignant neoplasm of colon: Secondary | ICD-10-CM

## 2022-03-25 SURGERY — COLONOSCOPY WITH PROPOFOL
Anesthesia: General

## 2022-03-25 NOTE — Telephone Encounter (Signed)
Denise Everett has been contacted to reschedule her colonoscopy.  She has been rescheduled to 04/08/22 with Dr. Hildred Laser. ? ?Thanks, ? ?Sharyn Lull, CMA ?

## 2022-03-27 ENCOUNTER — Other Ambulatory Visit: Payer: Self-pay | Admitting: Nurse Practitioner

## 2022-03-27 NOTE — Telephone Encounter (Signed)
Requested medication (s) are due for refill today: expired medication  Requested medication (s) are on the active medication list: yes  Last refill:  03/25/21 #180 4 refills  Future visit scheduled: yes in 5 months  Notes to clinic:  expired medication do you want to renew Rx?     Requested Prescriptions  Pending Prescriptions Disp Refills   gabapentin (NEURONTIN) 300 MG capsule [Pharmacy Med Name: GABAPENTIN 300 MG CAP] 180 capsule 4    Sig: TAKE ONE CAPSULE BY MOUTH TWICE A DAY     Neurology: Anticonvulsants - gabapentin Failed - 03/27/2022  8:48 AM      Failed - Cr in normal range and within 360 days    Creatinine  Date Value Ref Range Status  06/23/2014 1.57 (H) 0.60 - 1.30 mg/dL Final   Creatinine, Ser  Date Value Ref Range Status  02/28/2022 1.96 (H) 0.57 - 1.00 mg/dL Final         Passed - Completed PHQ-2 or PHQ-9 in the last 360 days      Passed - Valid encounter within last 12 months    Recent Outpatient Visits           3 weeks ago Centrilobular emphysema ([place])   [facility] Cannady, Jolene T, NP   7 months ago Schizophrenia, unspecified type ([place])   [place], Jolene T, NP   1 year ago Centrilobular emphysema ([place])   [place], Jolene T, NP   1 year ago Schizophrenia, unspecified type ([place])   [facility], Jolene T, NP   1 year ago Abnormal weight loss   [facility], Barbaraann Faster, NP       Future Appointments             In 5 months Cannady, Barbaraann Faster, NP [employer]

## 2022-04-01 ENCOUNTER — Inpatient Hospital Stay (HOSPITAL_BASED_OUTPATIENT_CLINIC_OR_DEPARTMENT_OTHER): Payer: Medicare Other | Admitting: Nurse Practitioner

## 2022-04-01 ENCOUNTER — Encounter: Payer: Self-pay | Admitting: Nurse Practitioner

## 2022-04-01 ENCOUNTER — Inpatient Hospital Stay: Payer: Medicare Other | Attending: Nurse Practitioner

## 2022-04-01 VITALS — BP 110/87 | HR 68 | Temp 96.6°F | Resp 16 | Wt 134.0 lb

## 2022-04-01 DIAGNOSIS — N189 Chronic kidney disease, unspecified: Secondary | ICD-10-CM | POA: Insufficient documentation

## 2022-04-01 DIAGNOSIS — D693 Immune thrombocytopenic purpura: Secondary | ICD-10-CM | POA: Insufficient documentation

## 2022-04-01 DIAGNOSIS — Z87891 Personal history of nicotine dependence: Secondary | ICD-10-CM | POA: Insufficient documentation

## 2022-04-01 DIAGNOSIS — I129 Hypertensive chronic kidney disease with stage 1 through stage 4 chronic kidney disease, or unspecified chronic kidney disease: Secondary | ICD-10-CM | POA: Insufficient documentation

## 2022-04-01 DIAGNOSIS — D472 Monoclonal gammopathy: Secondary | ICD-10-CM

## 2022-04-01 DIAGNOSIS — R634 Abnormal weight loss: Secondary | ICD-10-CM | POA: Insufficient documentation

## 2022-04-01 LAB — COMPREHENSIVE METABOLIC PANEL
ALT: 8 U/L (ref 0–44)
AST: 15 U/L (ref 15–41)
Albumin: 3.9 g/dL (ref 3.5–5.0)
Alkaline Phosphatase: 49 U/L (ref 38–126)
Anion gap: 4 — ABNORMAL LOW (ref 5–15)
BUN: 26 mg/dL — ABNORMAL HIGH (ref 8–23)
CO2: 26 mmol/L (ref 22–32)
Calcium: 8.8 mg/dL — ABNORMAL LOW (ref 8.9–10.3)
Chloride: 103 mmol/L (ref 98–111)
Creatinine, Ser: 1.78 mg/dL — ABNORMAL HIGH (ref 0.44–1.00)
GFR, Estimated: 30 mL/min — ABNORMAL LOW (ref >60)
Glucose, Bld: 110 mg/dL — ABNORMAL HIGH (ref 70–99)
Potassium: 4.4 mmol/L (ref 3.5–5.1)
Sodium: 133 mmol/L — ABNORMAL LOW (ref 135–145)
Total Bilirubin: 0.7 mg/dL (ref 0.3–1.2)
Total Protein: 7.4 g/dL (ref 6.5–8.1)

## 2022-04-01 LAB — CBC WITH DIFFERENTIAL/PLATELET
Abs Immature Granulocytes: 0.05 10*3/uL (ref 0.00–0.07)
Basophils Absolute: 0.1 10*3/uL (ref 0.0–0.1)
Basophils Relative: 1 %
Eosinophils Absolute: 0.2 10*3/uL (ref 0.0–0.5)
Eosinophils Relative: 4 %
HCT: 36.9 % (ref 36.0–46.0)
Hemoglobin: 12 g/dL (ref 12.0–15.0)
Immature Granulocytes: 1 %
Lymphocytes Relative: 36 %
Lymphs Abs: 2.1 10*3/uL (ref 0.7–4.0)
MCH: 31.2 pg (ref 26.0–34.0)
MCHC: 32.5 g/dL (ref 30.0–36.0)
MCV: 95.8 fL (ref 80.0–100.0)
Monocytes Absolute: 0.6 10*3/uL (ref 0.1–1.0)
Monocytes Relative: 10 %
Neutro Abs: 2.9 10*3/uL (ref 1.7–7.7)
Neutrophils Relative %: 48 %
Platelets: 68 10*3/uL — ABNORMAL LOW (ref 150–400)
RBC: 3.85 MIL/uL — ABNORMAL LOW (ref 3.87–5.11)
RDW: 13.2 % (ref 11.5–15.5)
WBC: 5.9 10*3/uL (ref 4.0–10.5)
nRBC: 0 % (ref 0.0–0.2)

## 2022-04-01 NOTE — Progress Notes (Signed)
Pt in to re establish care per CCM team request.

## 2022-04-01 NOTE — Progress Notes (Signed)
[place]  [phone] [phone]  ID: [name] OB: 11-02-1951  MR#: [id number]  JIR#:[id number]  Patient Care Team: Venita Lick, NP as PCP - General (Nurse Practitioner) Lloyd Huger, MD as Consulting Physician (Oncology) Vanita Ingles, RN as Case Manager (General Practice) Greg Cutter, LCSW as Social Worker (Licensed Clinical Social Worker)  CHIEF COMPLAINT: MGUS and ITP  INTERVAL HISTORY: Patient last evaluated in clinic in October 2021.  She is referred back for further evaluation of her platelet count and MGUS. Her weight loss has stabilized. She has no neurologic complaints. She denies any recent fevers or illnesses. She denies any easy bleeding or bruising. Denies melena or hematochezia. She denies chest pain, shortness of breath, cough, or hemoptysis. No nausea, vomiting, constipation, or diarrhea. She denies urinary complaints. She offers no further specific complaints today.   REVIEW OF SYSTEMS:   Review of Systems  Constitutional:  Negative for chills, fever, malaise/fatigue and weight loss.  HENT:  Negative for hearing loss, nosebleeds, sore throat and tinnitus.   Eyes:  Negative for blurred vision and double vision.  Respiratory:  Negative for cough, hemoptysis, shortness of breath and wheezing.   Cardiovascular:  Negative for chest pain, palpitations and leg swelling.  Gastrointestinal:  Negative for abdominal pain, blood in stool, constipation, diarrhea, melena, nausea and vomiting.  Genitourinary:  Negative for dysuria and urgency.  Musculoskeletal:  Negative for back pain, falls, joint pain and myalgias.  Skin:  Negative for itching and rash.  Neurological:  Negative for dizziness, tingling, sensory change, loss of consciousness, weakness and headaches.  Endo/Heme/Allergies:  Negative for environmental allergies. Does not bruise/bleed easily.  Psychiatric/Behavioral:  Negative for depression. The patient is  not nervous/anxious and does not have insomnia.   As per HPI. Otherwise, a complete review of systems is negative.  PAST MEDICAL HISTORY: Past Medical History:  Diagnosis Date   Abnormal weight loss 06/07/2015   Allergy    Cataract    Chronic kidney disease    Chronic pain syndrome    Hyperlipidemia    Hypertension    Leg pain    Osteoporosis    hips   Renal insufficiency    Schizophrenia (HCC)    Thrombocytopenia (HCC)    Tobacco use disorder     PAST SURGICAL HISTORY: Past Surgical History:  Procedure Laterality Date   ankle surgery Right    x's 2   CATARACT EXTRACTION      FAMILY HISTORY Family History  Problem Relation Age of Onset   Hyperlipidemia Mother    Hypertension Mother      ADVANCED DIRECTIVES:    HEALTH MAINTENANCE: Social History   Tobacco Use   Smoking status: Former    Packs/day: 1.00    Years: 43.00    Pack years: 43.00    Types: Cigarettes    Quit date: 07/21/2017    Years since quitting: 4.6   Smokeless tobacco: Former    Types: Snuff  Vaping Use   Vaping Use: Never used  Substance Use Topics   Alcohol use: No   Drug use: No     Colonoscopy:  PAP:  Bone density:  Lipid panel:  Allergies  Allergen Reactions   No Known Allergies     Current Outpatient Medications  Medication Sig Dispense Refill   atorvastatin (LIPITOR) 10 MG tablet TAKE 1 TABLET BY MOUTH DAILY AT 6 PM 90 tablet 4   diclofenac Sodium (VOLTAREN) 1 % GEL APPLY 4 GRAMS  TOPICALLY 4 TIMES DAILY 100 g 4   famotidine (PEPCID) 20 MG tablet Take 1 tablet (20 mg total) by mouth daily. 90 tablet 4   gabapentin (NEURONTIN) 300 MG capsule TAKE ONE CAPSULE BY MOUTH TWICE A DAY 180 capsule 4   lisinopril (ZESTRIL) 5 MG tablet Take 1 tablet (5 mg total) by mouth daily. 90 tablet 4   mirtazapine (REMERON) 15 MG tablet Take 0.5 tablets (7.5 mg total) by mouth at bedtime. 45 tablet 4   ondansetron (ZOFRAN ODT) 4 MG disintegrating tablet Take 1 tablet (4 mg total) by mouth  every 8 (eight) hours as needed for nausea or vomiting. 20 tablet 0   raloxifene (EVISTA) 60 MG tablet Take 1 tablet (60 mg total) by mouth daily. 90 tablet 4   risperiDONE (RISPERDAL) 2 MG tablet Take 0.5-1 tablets (1-2 mg total) by mouth at bedtime. 90 tablet 4   tiZANidine (ZANAFLEX) 4 MG tablet TAKE 1 TABLET BY MOUTH EVERY 6 HOURS AS NEEDED FOR MUSCLE SPASMS 60 tablet 4   No current facility-administered medications for this visit.    OBJECTIVE: Vitals:   04/01/22 1125  BP: 110/87  Pulse: 68  Resp: 16  Temp: (!) 96.6 F (35.9 C)  SpO2: 100%     Body mass index is 26.61 kg/m.    ECOG FS:0 - Asymptomatic  General: Well-developed, well-nourished, no acute distress. Eyes: Pink conjunctiva, anicteric sclera. Lungs: Clear to auscultation bilaterally.  No audible wheezing or coughing Heart: Regular rate and rhythm.  Abdomen: Soft, nontender, nondistended.  Musculoskeletal: No edema, cyanosis, or clubbing. Neuro: Alert, answering all questions appropriately. Cranial nerves grossly intact. Skin: No rashes or petechiae noted. Psych: Normal affect.   LAB RESULTS:  Lab Results  Component Value Date   NA 138 02/28/2022   K 4.6 02/28/2022   CL 102 02/28/2022   CO2 22 02/28/2022   GLUCOSE 87 02/28/2022   BUN 20 02/28/2022   CREATININE 1.96 (H) 02/28/2022   CALCIUM 9.1 02/28/2022   PROT 6.5 02/28/2022   ALBUMIN 4.0 02/28/2022   AST 16 02/28/2022   ALT 11 02/28/2022   ALKPHOS 64 02/28/2022   BILITOT 0.2 02/28/2022   GFRNONAA 21 (L) 11/06/2020   GFRAA 24 (L) 11/06/2020    Lab Results  Component Value Date   WBC 5.4 02/28/2022   NEUTROABS 2.5 02/28/2022   HGB 11.2 02/28/2022   HCT 32.3 (L) 02/28/2022   MCV 92 02/28/2022   PLT 55 (LL) 02/28/2022     STUDIES: No results found.   ASSESSMENT: MGUS and ITP.  PLAN:    1.  ITP: Previously, bone marrow biopsy confirmed the results. She had a response to prednisone but it was not durable. She last received rituxan in  September 2013. Platelet count had improved to greater than 100. Her platelet count has now decreased to 50-70s, today is 68. Would consider treatment for platelet count < 30,000. She has colonoscopy upcoming. Her platelets are greater than 50,000 and therefore, no need for intervention.   2. MGUS:  Bone marrow biopsy revealed less than 5% plasma cells.  His recent M spike was stable at 0.7. Plan to recheck SPEP in 3 months. Metabolic profile is stable and clinically she is asymptomatic.    3. Weight loss: weight has stabilized. Consider CT scans in the future if she has ongoing unexplained weight loss.   4. CKD- currently no issues with anemia of CKD. Could check iron studies at next visit though to optimize.   Disposition:  3 mo- lab (cbc, cmp, spep, kappa lambda light chains), Dr. Grayland Ormond or APP- la  I spent a total of 20 minutes reviewing chart data, face-to-face evaluation with the patient, counseling and coordination of care as detailed above.  Patient expressed understanding and was in agreement with this plan. She also understands that She can call clinic at any time with any questions, concerns, or complaints.   Verlon Au, NP   04/01/2022   CC: Marnee Guarneri, NP

## 2022-04-03 ENCOUNTER — Other Ambulatory Visit: Payer: Self-pay | Admitting: Nurse Practitioner

## 2022-04-04 NOTE — Telephone Encounter (Signed)
Requested Prescriptions  Pending Prescriptions Disp Refills  . lisinopril (ZESTRIL) 5 MG tablet [Pharmacy Med Name: LISINOPRIL 5 MG TAB] 90 tablet 1    Sig: TAKE 1 TABLET BY MOUTH DAILY     Cardiovascular:  ACE Inhibitors Failed - 04/03/2022 10:26 AM      Failed - Cr in normal range and within 180 days    Creatinine  Date Value Ref Range Status  06/23/2014 1.57 (H) 0.60 - 1.30 mg/dL Final   Creatinine, Ser  Date Value Ref Range Status  04/01/2022 1.78 (H) 0.44 - 1.00 mg/dL Final         Passed - K in normal range and within 180 days    Potassium  Date Value Ref Range Status  04/01/2022 4.4 3.5 - 5.1 mmol/L Final  06/23/2014 5.0 3.5 - 5.1 mmol/L Final         Passed - Patient is not pregnant      Passed - Last BP in normal range    BP Readings from Last 1 Encounters:  04/01/22 110/87         Passed - Valid encounter within last 6 months    Recent Outpatient Visits          1 month ago Centrilobular emphysema ([place])   [place] Cannady, Jolene T, NP   7 months ago Schizophrenia, unspecified type ([place])   [place], Barbaraann Faster, NP   1 year ago Centrilobular emphysema ([place])   [place], Jolene T, NP   1 year ago Schizophrenia, unspecified type ([place])   [place], Jolene T, NP   1 year ago Abnormal weight loss   [place], Barbaraann Faster, NP      Future Appointments            In 5 months Cannady, Barbaraann Faster, NP [employer]

## 2022-04-08 ENCOUNTER — Ambulatory Visit: Admission: RE | Admit: 2022-04-08 | Payer: Medicare Other | Source: Home / Self Care | Admitting: Gastroenterology

## 2022-04-08 ENCOUNTER — Encounter: Payer: Self-pay | Admitting: Certified Registered"

## 2022-04-08 ENCOUNTER — Encounter: Admission: RE | Payer: Self-pay | Source: Home / Self Care

## 2022-04-08 SURGERY — COLONOSCOPY WITH PROPOFOL
Anesthesia: General

## 2022-04-09 ENCOUNTER — Telehealth: Payer: Medicare Other

## 2022-04-09 ENCOUNTER — Ambulatory Visit (INDEPENDENT_AMBULATORY_CARE_PROVIDER_SITE_OTHER): Payer: Medicare Other

## 2022-04-09 DIAGNOSIS — Z87891 Personal history of nicotine dependence: Secondary | ICD-10-CM

## 2022-04-09 DIAGNOSIS — N1832 Chronic kidney disease, stage 3b: Secondary | ICD-10-CM

## 2022-04-09 DIAGNOSIS — D472 Monoclonal gammopathy: Secondary | ICD-10-CM

## 2022-04-09 DIAGNOSIS — I129 Hypertensive chronic kidney disease with stage 1 through stage 4 chronic kidney disease, or unspecified chronic kidney disease: Secondary | ICD-10-CM | POA: Diagnosis not present

## 2022-04-09 DIAGNOSIS — F209 Schizophrenia, unspecified: Secondary | ICD-10-CM

## 2022-04-09 DIAGNOSIS — E785 Hyperlipidemia, unspecified: Secondary | ICD-10-CM

## 2022-04-09 DIAGNOSIS — E7800 Pure hypercholesterolemia, unspecified: Secondary | ICD-10-CM

## 2022-04-09 DIAGNOSIS — G8929 Other chronic pain: Secondary | ICD-10-CM

## 2022-04-09 DIAGNOSIS — I10 Essential (primary) hypertension: Secondary | ICD-10-CM

## 2022-04-09 NOTE — Patient Instructions (Signed)
Visit Information  Thank you for taking time to visit with me today. Please don't hesitate to contact me if I can be of assistance to you before our next scheduled telephone appointment.  Following are the goals we discussed today:  Chronic Kidney Disease (Status: Goal on Track (progressing): YES.)  Long Term Goal  Last practice recorded BP readings:     BP Readings from Last 3 Encounters:  04/01/22 110/87  02/28/22 104/69  08/27/21 129/83  Most recent eGFR/CrCl:       Lab Results  Component Value Date    EGFR 27 (L) 02/28/2022    No components found for: CRCL   Assessed the patient understanding of chronic kidney disease. 03-07-2022: The patient states she is trying to call the [facility] and doesn't think she has the right number. Called with the patient to [facility] in [place]. The office is closed. Left a detailed message with patients number and the RNCM to assist with appointment being scheduled.  04-09-2022: The patient states that she has a follow up appointment tomorrow with the "kidney doctor". She says she will make this appointment because the "wagon" is going to take her and pick her up. She has already arranged this transportation. She states that she is doing well and denies any issues at this time with her CKD.  Evaluation of current treatment plan related to chronic kidney disease self management and patient's adherence to plan as established by provider. 04-09-2022: Review of CKD today. The patient denies any new concerns with CKD.       Provided education to patient re: stroke prevention, s/s of heart attack and stroke    Reviewed prescribed diet heart healthy/Renal. 03-07-2022: The patient states she is not losing weight anymore and she is maintaining her weight. She was eating at the time of the call. She says her appetite is good. 04-09-2022: The patients weight is stable. She denies any weight loss. She states she is eating and denies any new concerns  with her kidney function. Reviewed medications with patient and discussed importance of compliance. 04-09-2022: The patient states compliance with medications    Counseled on adverse effects of illicit drug and excessive alcohol use in patients with chronic kidney disease. 11-26-2021: The patient denies any alcohol use   Discussed complications of poorly controlled blood pressure such as heart disease, stroke, circulatory complications, vision complications, kidney impairment, sexual dysfunction    Reviewed scheduled/upcoming provider appointments including: Patient has an appointment tomorrow (04-10-2022) with specialist. She has arranged transportation with the "wagon" Advised patient to discuss changes in kidney function  with provider    Discussed plans with patient for ongoing care management follow up and provided patient with direct contact information for care management team    Screening for signs and symptoms of depression related to chronic disease state      Discussed the impact of chronic kidney disease on daily life and mental health and acknowledged and normalized feelings of disempowerment, fear, and frustration    Assessed social determinant of health barriers    Provided education on kidney disease progression    Engage patient in early, proactive and ongoing discussion about goals of care and what matters most to them    Support coping and stress management by recognizing current strategies and assist in developing new strategies such as mindfulness, journaling, relaxation techniques, problem-solving    Assisted the patient with making an appointment for follow up with the nephrologist on 10-22-2021 at 130  pm. The patient is going to arrange transportation with the "wagon". The patient able to tell RNCM her appointment and promised she would make it. 11-26-2021: The patient missed her appointment with nephrologist on 10-22-2021. The patient states she did not know she had to renew her  transportation benefits. Education given. The RNCM has rescheduled this appointment multiple times. She did not call and cancel her appointment. The patient states she feels fine and she will talk to pcp about it at her next visit in April. 03-07-2022: Called with the patient to [facility] [phone]. Had to leave a message for them to call the patient back for an appointment for follow up. Patients appointment is for 04-10-2022.      MGUS- monoclonal gammopathy of unknown significance   (Status: Goal on Track (progressing): YES.) Long Term Goal  Evaluation of current treatment plan related to  MGUS , Limited social support, Transportation, and Mental Health Concerns  self-management and patient's adherence to plan as established by provider. 11-26-2021: The patient did not make her appointment on 10-17-2021. The patient states that her transportation cancelled on her and she did not call and cancel the appointment. The patient has a long standing history of "no shows" to appointments. The patient feels she is doing well and denies any new concerns. 01-28-2022: The patient feels her condition is stable. She has still not went to see the oncologist. She denies any new concerns. Will discuss with pcp in April. 03-07-2022: The patient has low platelets and has a follow up appointment with oncology on 03-18-2022. 04-09-2022: The patient saw oncology recently and her condition is stable. She denies any new concerns related to MGUS.  Discussed plans with patient for ongoing care management follow up and provided patient with direct contact information for care management team Advised patient to keep appointments, and follow the recommendations of the provider. Pcp appointment 09-01-2022 at 120 pm, saw oncology recently Provided education to patient re: the importance of follow up with oncologist due to low platelet count of 53. The patient verbalized understanding of the importance of keeping her appointment.  04-09-2022: The patient was able to see oncologist and the patient was able to tell the [facility] how her visit went. Denies any new concerns related to MGUS; Reviewed medications with patient and discussed states compliance with medications. 04-09-2022: The patient is compliant with medications.  ; Reviewed scheduled/upcoming provider appointments including : 09-01-2022 at 120 pm with pcp Discussed plans with patient for ongoing care management follow up and provided patient with direct contact information for care management team; Advised patient to discuss changes in platelets, and why they continue to decrease with provider; Screening for signs and symptoms of depression related to chronic disease state;  Assessed social determinant of health barriers;       Schizophrenia   (Status: Goal on Track (progressing): YES.) Long Term Goal  Evaluation of current treatment plan related to  Schizophrenia , Limited social support, Transportation, Level of care concerns, Mental Health Concerns , and Social Isolation self-management and patient's adherence to plan as established by provider. 03-07-2022: The patient states she is doing well. She is sleeping well and feels rested. She is having some pain in her legs but is taking Tylenol for pain relief. She paces her activity and has what she needs. States she still does not have an appetite but she eats and she feels like she is doing well. Denies any changes in mood, anxiety, or depression. She has her appointments  in place and is doing well. 04-09-2022: The patient is frustrated with her family because her sister did not show up to take her for her colonoscopy. She states she was prepared and also was ready. Per the patient her sister got mad at her and would not answer her phone. She says she cannot depend on her family. The patient states that she has had no support from her sister or brother since their mother died. She denies any other concerns at this time. The  patient has an appointment with the kidney specialist tomorrow.  Discussed plans with patient for ongoing care management follow up and provided patient with direct contact information for care management team Advised patient to call the office for changes in mood, anxiety, or depression ; Provided education to patient re: monitoring for changes in mental health and regular follow ups with specialist and pcp ; Reviewed medications with patient and discussed compliance. 04-09-2022: The patient is compliant with her medications ; Reviewed scheduled/upcoming provider appointments including 09-01-2022 Discussed plans with patient for ongoing care management follow up and provided patient with direct contact information for care management team; Advised patient to discuss new concerns or changes in mental health  with provider; Screening for signs and symptoms of depression related to chronic disease state;  Assessed social determinant of health barriers;    Hyperlipidemia:  (Status: Goal on Track (progressing): YES.) Long Term Goal       Lab Results  Component Value Date    CHOL 184 02/28/2022    HDL 49 02/28/2022    LDLCALC 115 (H) 02/28/2022    TRIG 110 02/28/2022      Medication review performed; medication list updated in electronic medical record. 04-09-2022 Patient takes Lipitor 10 mg QD Provider established cholesterol goals reviewed. 04-09-2022: Review of goals. The patient has good numbers. ; Counseled on importance of regular laboratory monitoring as prescribed. 04-09-2022: The patient has regular lab testing for cholesterol levels Provided HLD educational materials; Reviewed role and benefits of statin for ASCVD risk reduction; Discussed strategies to manage statin-induced myalgias; Reviewed importance of limiting foods high in cholesterol. 11-26-2021: The patient states she is eating, doesn't have an appetite but she is eating and staying hydrated. Denies any acute findings related to  dietary restrictions. 03-07-2022: the patient was eating at the time of the call. The patient states she is maintaining her weight and she denies any weight loss. States she feels good. 04-09-2022: The patient is eating well and staying hydrated. The patient states her weight is stable. Denies any acute findings. Will continue to monitor for changes. ;   Hypertension: (Status: Goal on Track (progressing): YES.) Last practice recorded BP readings:     BP Readings from Last 3 Encounters:  04/01/22 110/87  02/28/22 104/69  08/27/21 129/83  Most recent eGFR/CrCl:       Lab Results  Component Value Date    EGFR 27 (L) 02/28/2022    No components found for: CRCL   Evaluation of current treatment plan related to hypertension self management and patient's adherence to plan as established by provider. 04-09-2022: The patient has normalized blood pressures. Denies any issues with HTN or heart health;   Provided education to patient re: stroke prevention, s/s of heart attack and stroke; Reviewed prescribed diet heart healthy/Renal  Reviewed medications with patient and discussed importance of compliance. 04-09-2022: Reports compliance with medications. Review of medications.   Discussed plans with patient for ongoing care management follow up and provided patient with  direct contact information for care management team; Advised patient, providing education and rationale, to monitor blood pressure daily and record, calling PCP for findings outside established parameters;  Provided education on prescribed diet heart healthy/Renal ;  Discussed complications of poorly controlled blood pressure such as heart disease, stroke, circulatory complications, vision complications, kidney impairment, sexual dysfunction;        Pain:  (Status: New goal. Goal on Track (progressing): YES.) Long Term Goal  Pain assessment performed. 11-26-2021: The patient states that her pain in her bilateral legs today is 8 on a scale  of 0-10. She states it hurts from her thighs down to her feet and especially where she broke her ankle. She had laid back down after taking Tylenol and is feeling much better. Denies any falls or safety concerns. 04-09-2022: denies any pain today Medications reviewed. 01-28-2022: The patient endorses taking Tylenol for pain relief. She states she is not using "goody powders" like she was. She is following the recommendation by the pcp not to take "goody powders".  Reviewed provider established plan for pain management. 04-09-2022: the patient states that she is managing her pain effectively. She denies any acute distress. States she only takes Tylenol when she needs it.  Discussed importance of adherence to all scheduled medical appointments. Office staff to call the patient and secure a new appointment with the pcp; Counseled on the importance of reporting any/all new or changed pain symptoms or management strategies to pain management provider; Advised patient to report to care team affect of pain on daily activities; Discussed use of relaxation techniques and/or diversional activities to assist with pain reduction (distraction, imagery, relaxation, massage, acupressure, TENS, heat, and cold application; Reviewed with patient prescribed pharmacological and nonpharmacological pain relief strategies; Advised patient to discuss unresolved pain, changes in level or intensity of pain with provider; Screening for signs and symptoms of depression related to chronic disease state;  Assessed social determinant of health barriers;      Our next appointment is by telephone on 05-21-2022 at 345 pm   Please call the care guide team at [phone] if you need to cancel or reschedule your appointment.   If you are experiencing a Mental Health or [place] or need someone to talk to, please call the Suicide and Crisis Lifeline: 988 call the [place] National Suicide Prevention Lifeline: 217-140-0437 or  TTY: 541 525 3921 TTY (224)553-5677) to talk to a trained counselor call 1-800-273-TALK (toll free, 24 hour hotline)   The patient verbalized understanding of instructions, educational materials, and care plan provided today and DECLINED offer to receive copy of patient instructions, educational materials, and care plan.   Noreene Larsson RN, MSN, [facility] Mobile: [phone]

## 2022-04-09 NOTE — Chronic Care Management (AMB) (Signed)
Chronic Care Management   CCM RN Visit Note  04/09/2022 Name: Denise Everett MRN: [id number] DOB: 24-Jun-1951  Subjective: Denise Everett is a 71 y.o. year old female who is a primary care patient of Cannady, Denise Faster, NP. The care management team was consulted for assistance with disease management and care coordination needs.    Engaged with patient by telephone for follow up visit in response to provider referral for case management and/or care coordination services.   Consent to Services:  The patient was given information about Chronic Care Management services, agreed to services, and gave verbal consent prior to initiation of services.  Please see initial visit note for detailed documentation.   Patient agreed to services and verbal consent obtained.   Assessment: Review of patient past medical history, allergies, medications, health status, including review of consultants reports, laboratory and other test data, was performed as part of comprehensive evaluation and provision of chronic care management services.   SDOH (Social Determinants of Health) assessments and interventions performed:    CCM Care Plan  Allergies  Allergen Reactions   No Known Allergies     Outpatient Encounter Medications as of 04/09/2022  Medication Sig   atorvastatin (LIPITOR) 10 MG tablet TAKE 1 TABLET BY MOUTH DAILY AT 6 PM   diclofenac Sodium (VOLTAREN) 1 % GEL APPLY 4 GRAMS TOPICALLY 4 TIMES DAILY   famotidine (PEPCID) 20 MG tablet Take 1 tablet (20 mg total) by mouth daily.   gabapentin (NEURONTIN) 300 MG capsule TAKE ONE CAPSULE BY MOUTH TWICE A DAY   lisinopril (ZESTRIL) 5 MG tablet TAKE 1 TABLET BY MOUTH DAILY   mirtazapine (REMERON) 15 MG tablet Take 0.5 tablets (7.5 mg total) by mouth at bedtime.   ondansetron (ZOFRAN ODT) 4 MG disintegrating tablet Take 1 tablet (4 mg total) by mouth every 8 (eight) hours as needed for nausea or vomiting.   raloxifene (EVISTA) 60 MG tablet Take 1 tablet (60  mg total) by mouth daily.   risperiDONE (RISPERDAL) 2 MG tablet Take 0.5-1 tablets (1-2 mg total) by mouth at bedtime.   tiZANidine (ZANAFLEX) 4 MG tablet TAKE 1 TABLET BY MOUTH EVERY 6 HOURS AS NEEDED FOR MUSCLE SPASMS   No facility-administered encounter medications on file as of 04/09/2022.    Patient Active Problem List   Diagnosis Date Noted   Elevated prolactin level 03/01/2022   Hematuria 03/25/2021   Aortic atherosclerosis ([place]) 10/20/2020   Centrilobular emphysema ([place]) 10/20/2020   CKD (chronic kidney disease) stage 3, GFR 30-59 ml/min (HCC) 02/29/2020   Osteoarthritis of lumbar spine 04/15/2017   Lumbar facet arthropathy 04/15/2017   Lumbar facet syndrome (Bilateral) (R>L) 04/15/2017   Vitamin D insufficiency 03/18/2017   Marijuana use 03/03/2017   Chronic pain syndrome 02/24/2017   Chronic ankle pain  (Location of Tertiary source of pain) (Bilateral) (R>L) 02/24/2017   Abnormal MRI, lumbar spine (06/22/2014) 02/24/2017   Lumbar central spinal stenosis (Severe at L3-4) 02/24/2017   Lumbar foraminal stenosis (Severe: Left L3-4; Right L4-5; Bilateral L5-S1) 02/24/2017   Lumbar lateral recess stenosis (Severe: Left L3-4; Right L4-5; Left L5-S1) 02/24/2017   Personal history of tobacco use, presenting hazards to health 02/03/2017   Advance care planning 10/08/2016   MGUS (monoclonal gammopathy of unknown significance) 06/23/2016   Schizophrenia ([place]) 06/07/2015   Osteoporosis 06/07/2015   Chronic low back pain (Location of Primary Source of Pain) (Bilateral) (R>L) 06/07/2015   Idiopathic thrombocytopenic purpura ([place]) 06/07/2015   Allergic rhinitis 06/07/2015   Chronic  leg pain 06/07/2015   Hyperlipidemia 06/07/2015   Hypertension 06/07/2015   Lumbar IVDD (intervertebral disc displacement) 09/29/2014   Neuritis or radiculitis due to rupture of lumbar intervertebral disc 09/29/2014   Chronic Lumbar radiculitis 09/29/2014   Lumbar HNP (herniated nucleus pulposus) 09/29/2014     Conditions to be addressed/monitored:HTN, HLD, CKD Stage 3, and MGUS, Schizophrenia, and Chronic Pain  Care Plan : RNCM: General Plan of Care (Adult) for Chronic Disease Management and Care Coordination Needs  Updates made by Vanita Ingles, RN since 04/09/2022 12:00 AM     Problem: RNCM; Development of Plan of Care for Chronic Disease Management (HTN, HLD, CKD3, MGUS, and Schizophrenia, Chronic Pain )   Priority: High     Long-Range Goal: RNCM: Effective Management of Plan of Care for Chronic Disease Management (HTN, HLD, CKD3, MGUS, and Schizophrenia, Chronic pain)   Start Date: 09/24/2021  Expected End Date: 09/24/2022  Priority: High  Note:   Current Barriers:  Knowledge Deficits related to plan of care for management of HTN, HLD, CKD Stage 3, and Schizophrenia and MGUS, Chronic Pain Care Coordination needs related to Limited social support, Transportation, Mental Health Concerns , Social Isolation, and Literacy concerns  Chronic Disease Management support and education needs related to HTN, HLD, CKD Stage 3, and Shizophrenia and MGUS, Chronic pain  Lacks caregiver support.        Literacy barriers Transportation barriers- 11-26-2021- has missed several appointments. States she sets up transportation but then transportation tells her she has to renew. Have had issues with transportation for a long time. Review and education done. 01-28-2022: Review of upcoming appointments and transportation needs. The patient states she has secured transportation and her sister is going with her to her appointments. 03-07-2022: Review of upcoming appointment for colonoscopy and the patient states that her sister is going with her when she has her colonoscopy. 04-09-2022: The colonoscopy was cancelled for 03-25-2022. The patient was no show for 04-08-2022. The patient states her sister got mad at her and would not take her. Spoke with the patient via phone today.  Non-adherence to scheduled provider  appointments Non-adherence to prescribed medication regimen  RNCM Clinical Goal(s):  Patient will verbalize basic understanding of HTN, HLD, CKD Stage Schizophrenia , and MGUS, Chronic pain,  disease process and self health management plan as evidenced by keeping appointments, following plan of care and taking medications as directed  take all medications exactly as prescribed and will call provider for medication related questions as evidenced by compliance    attend all scheduled medical appointments: 10-17-2021 at 1 pm with oncology and 10-22-2021 at 130 pm with nephrology as evidenced by keeping appointments and calling if she cannot make the appointment. 11-26-2021: The patient missed both appointments. The patient states she had established transportation but they cancelled on her. She did not call and cancel appointments. The patient has a long standing history of non-compliance with follow up visits. The patient needs a rescheduled appointment with pcp in April. [place] office to call and schedule appointment with the patient.  01-28-2022: The patient states that she has transportation set up for her upcoming appointments. She states that she has a mammogram on 4-4 and pcp on 4-21 and colonoscopy on 5-16. She states that her sister is going to take her to her appointments. Will continue to monitor for changes. 03-07-2022: The patient has an upcoming appointment for colonoscopy on 03-25-2022 and states her sister is going with her. She has a follow up with oncology  on 03-18-2022, and called with the patient today and left a message for [facility] to call her for an appointment. Attempted to help patient make an appointment today but had to leave a message on voicemail.  04-09-2022: Spoke to the patient today and she missed her colonoscopy yesterday because her "sister was mad at me". She had done the bowel prep and was ready to be picked up. Her sister would not answer her phone until late  last night when she told the patient she thought someone else was going to take her. The patient states she cannot depend on  her family to help her.      demonstrate improved and ongoing adherence to prescribed treatment plan for HTN, HLD, CKD Stage 3, and Schizophrenia and MGUS, and chronic pain  as evidenced by working with the CCM team to optimize plan of care for effective management of health and well being  continue to work with Consulting civil engineer and/or Social Worker to address care management and care coordination needs related to HTN, HLD, CKD Stage 3, and Schizophrenia and MGUS, and chronic pain, as evidenced by adherence to CM Team Scheduled appointments     demonstrate ongoing self health care management ability for effective management of chronic conditions  as evidenced by  working with the CCM team through collaboration with Consulting civil engineer, provider, and care team.   Interventions: 1:1 collaboration with primary care provider regarding development and update of comprehensive plan of care as evidenced by provider attestation and co-signature Inter-disciplinary care team collaboration (see longitudinal plan of care) Evaluation of current treatment plan related to  self management and patient's adherence to plan as established by provider   Chronic Kidney Disease (Status: Goal on Track (progressing): YES.)  Long Term Goal  Last practice recorded BP readings:  BP Readings from Last 3 Encounters:  04/01/22 110/87  02/28/22 104/69  08/27/21 129/83  Most recent eGFR/CrCl:  Lab Results  Component Value Date   EGFR 27 (L) 02/28/2022    No components found for: CRCL  Assessed the patient understanding of chronic kidney disease. 03-07-2022: The patient states she is trying to call the [facility] and doesn't think she has the right number. Called with the patient to [facility] in [place]. The office is closed. Left a detailed message with patients number and the RNCM  to assist with appointment being scheduled.  04-09-2022: The patient states that she has a follow up appointment tomorrow with the "kidney doctor". She says she will make this appointment because the "wagon" is going to take her and pick her up. She has already arranged this transportation. She states that she is doing well and denies any issues at this time with her CKD.  Evaluation of current treatment plan related to chronic kidney disease self management and patient's adherence to plan as established by provider. 04-09-2022: Review of CKD today. The patient denies any new concerns with CKD.       Provided education to patient re: stroke prevention, s/s of heart attack and stroke    Reviewed prescribed diet heart healthy/Renal. 03-07-2022: The patient states she is not losing weight anymore and she is maintaining her weight. She was eating at the time of the call. She says her appetite is good. 04-09-2022: The patients weight is stable. She denies any weight loss. She states she is eating and denies any new concerns with her kidney function. Reviewed medications with patient and discussed importance of  compliance. 04-09-2022: The patient states compliance with medications    Counseled on adverse effects of illicit drug and excessive alcohol use in patients with chronic kidney disease. 11-26-2021: The patient denies any alcohol use   Discussed complications of poorly controlled blood pressure such as heart disease, stroke, circulatory complications, vision complications, kidney impairment, sexual dysfunction    Reviewed scheduled/upcoming provider appointments including: Patient has an appointment tomorrow (04-10-2022) with specialist. She has arranged transportation with the "wagon" Advised patient to discuss changes in kidney function  with provider    Discussed plans with patient for ongoing care management follow up and provided patient with direct contact information for care management team    Screening  for signs and symptoms of depression related to chronic disease state      Discussed the impact of chronic kidney disease on daily life and mental health and acknowledged and normalized feelings of disempowerment, fear, and frustration    Assessed social determinant of health barriers    Provided education on kidney disease progression    Engage patient in early, proactive and ongoing discussion about goals of care and what matters most to them    Support coping and stress management by recognizing current strategies and assist in developing new strategies such as mindfulness, journaling, relaxation techniques, problem-solving    Assisted the patient with making an appointment for follow up with the nephrologist on 10-22-2021 at 130 pm. The patient is going to arrange transportation with the "wagon". The patient able to tell RNCM her appointment and promised she would make it. 11-26-2021: The patient missed her appointment with nephrologist on 10-22-2021. The patient states she did not know she had to renew her transportation benefits. Education given. The RNCM has rescheduled this appointment multiple times. She did not call and cancel her appointment. The patient states she feels fine and she will talk to pcp about it at her next visit in April. 03-07-2022: Called with the patient to [facility] [phone]. Had to leave a message for them to call the patient back for an appointment for follow up. Patients appointment is for 04-10-2022.    MGUS- monoclonal gammopathy of unknown significance   (Status: Goal on Track (progressing): YES.) Long Term Goal  Evaluation of current treatment plan related to  MGUS , Limited social support, Transportation, and Mental Health Concerns  self-management and patient's adherence to plan as established by provider. 11-26-2021: The patient did not make her appointment on 10-17-2021. The patient states that her transportation cancelled on her and she did not call and  cancel the appointment. The patient has a long standing history of "no shows" to appointments. The patient feels she is doing well and denies any new concerns. 01-28-2022: The patient feels her condition is stable. She has still not went to see the oncologist. She denies any new concerns. Will discuss with pcp in April. 03-07-2022: The patient has low platelets and has a follow up appointment with oncology on 03-18-2022. 04-09-2022: The patient saw oncology recently and her condition is stable. She denies any new concerns related to MGUS.  Discussed plans with patient for ongoing care management follow up and provided patient with direct contact information for care management team Advised patient to keep appointments, and follow the recommendations of the provider. Pcp appointment 09-01-2022 at 120 pm, saw oncology recently Provided education to patient re: the importance of follow up with oncologist due to low platelet count of 53. The patient verbalized understanding of the importance of keeping  her appointment. 04-09-2022: The patient was able to see oncologist and the patient was able to tell the [facility] how her visit went. Denies any new concerns related to MGUS; Reviewed medications with patient and discussed states compliance with medications. 04-09-2022: The patient is compliant with medications.  ; Reviewed scheduled/upcoming provider appointments including : 09-01-2022 at 120 pm with pcp Discussed plans with patient for ongoing care management follow up and provided patient with direct contact information for care management team; Advised patient to discuss changes in platelets, and why they continue to decrease with provider; Screening for signs and symptoms of depression related to chronic disease state;  Assessed social determinant of health barriers;     Schizophrenia   (Status: Goal on Track (progressing): YES.) Long Term Goal  Evaluation of current treatment plan related to  Schizophrenia ,  Limited social support, Transportation, Level of care concerns, Mental Health Concerns , and Social Isolation self-management and patient's adherence to plan as established by provider. 03-07-2022: The patient states she is doing well. She is sleeping well and feels rested. She is having some pain in her legs but is taking Tylenol for pain relief. She paces her activity and has what she needs. States she still does not have an appetite but she eats and she feels like she is doing well. Denies any changes in mood, anxiety, or depression. She has her appointments in place and is doing well. 04-09-2022: The patient is frustrated with her family because her sister did not show up to take her for her colonoscopy. She states she was prepared and also was ready. Per the patient her sister got mad at her and would not answer her phone. She says she cannot depend on her family. The patient states that she has had no support from her sister or brother since their mother died. She denies any other concerns at this time. The patient has an appointment with the kidney specialist tomorrow.  Discussed plans with patient for ongoing care management follow up and provided patient with direct contact information for care management team Advised patient to call the office for changes in mood, anxiety, or depression ; Provided education to patient re: monitoring for changes in mental health and regular follow ups with specialist and pcp ; Reviewed medications with patient and discussed compliance. 04-09-2022: The patient is compliant with her medications ; Reviewed scheduled/upcoming provider appointments including 09-01-2022 Discussed plans with patient for ongoing care management follow up and provided patient with direct contact information for care management team; Advised patient to discuss new concerns or changes in mental health  with provider; Screening for signs and symptoms of depression related to chronic disease state;   Assessed social determinant of health barriers;   Hyperlipidemia:  (Status: Goal on Track (progressing): YES.) Long Term Goal  Lab Results  Component Value Date   CHOL 184 02/28/2022   HDL 49 02/28/2022   LDLCALC 115 (H) 02/28/2022   TRIG 110 02/28/2022     Medication review performed; medication list updated in electronic medical record. 04-09-2022 Patient takes Lipitor 10 mg QD Provider established cholesterol goals reviewed. 04-09-2022: Review of goals. The patient has good numbers. ; Counseled on importance of regular laboratory monitoring as prescribed. 04-09-2022: The patient has regular lab testing for cholesterol levels Provided HLD educational materials; Reviewed role and benefits of statin for ASCVD risk reduction; Discussed strategies to manage statin-induced myalgias; Reviewed importance of limiting foods high in cholesterol. 11-26-2021: The patient states she is eating, doesn't have  an appetite but she is eating and staying hydrated. Denies any acute findings related to dietary restrictions. 03-07-2022: the patient was eating at the time of the call. The patient states she is maintaining her weight and she denies any weight loss. States she feels good. 04-09-2022: The patient is eating well and staying hydrated. The patient states her weight is stable. Denies any acute findings. Will continue to monitor for changes. ;  Hypertension: (Status: Goal on Track (progressing): YES.) Last practice recorded BP readings:  BP Readings from Last 3 Encounters:  04/01/22 110/87  02/28/22 104/69  08/27/21 129/83  Most recent eGFR/CrCl:  Lab Results  Component Value Date   EGFR 27 (L) 02/28/2022    No components found for: CRCL  Evaluation of current treatment plan related to hypertension self management and patient's adherence to plan as established by provider. 04-09-2022: The patient has normalized blood pressures. Denies any issues with HTN or heart health;   Provided education to patient  re: stroke prevention, s/s of heart attack and stroke; Reviewed prescribed diet heart healthy/Renal  Reviewed medications with patient and discussed importance of compliance. 04-09-2022: Reports compliance with medications. Review of medications.   Discussed plans with patient for ongoing care management follow up and provided patient with direct contact information for care management team; Advised patient, providing education and rationale, to monitor blood pressure daily and record, calling PCP for findings outside established parameters;  Provided education on prescribed diet heart healthy/Renal ;  Discussed complications of poorly controlled blood pressure such as heart disease, stroke, circulatory complications, vision complications, kidney impairment, sexual dysfunction;     Pain:  (Status: New goal. Goal on Track (progressing): YES.) Long Term Goal  Pain assessment performed. 11-26-2021: The patient states that her pain in her bilateral legs today is 8 on a scale of 0-10. She states it hurts from her thighs down to her feet and especially where she broke her ankle. She had laid back down after taking Tylenol and is feeling much better. Denies any falls or safety concerns. 04-09-2022: denies any pain today Medications reviewed. 01-28-2022: The patient endorses taking Tylenol for pain relief. She states she is not using "goody powders" like she was. She is following the recommendation by the pcp not to take "goody powders".  Reviewed provider established plan for pain management. 04-09-2022: the patient states that she is managing her pain effectively. She denies any acute distress. States she only takes Tylenol when she needs it.  Discussed importance of adherence to all scheduled medical appointments. Office staff to call the patient and secure a new appointment with the pcp; Counseled on the importance of reporting any/all new or changed pain symptoms or management strategies to pain management  provider; Advised patient to report to care team affect of pain on daily activities; Discussed use of relaxation techniques and/or diversional activities to assist with pain reduction (distraction, imagery, relaxation, massage, acupressure, TENS, heat, and cold application; Reviewed with patient prescribed pharmacological and nonpharmacological pain relief strategies; Advised patient to discuss unresolved pain, changes in level or intensity of pain with provider; Screening for signs and symptoms of depression related to chronic disease state;  Assessed social determinant of health barriers;    Patient Goals/Self-Care Activities: Patient will self administer medications as prescribed as evidenced by self report/primary caregiver report  Patient will attend all scheduled provider appointments as evidenced by clinician review of documented attendance to scheduled appointments and patient/caregiver report Patient will call pharmacy for medication refills as evidenced by  patient report and review of pharmacy fill history as appropriate Patient will attend church or other social activities as evidenced by patient report Patient will continue to perform ADL's independently as evidenced by patient/caregiver report Patient will continue to perform IADL's independently as evidenced by patient/caregiver report Patient will call provider office for new concerns or questions as evidenced by review of documented incoming telephone call notes and patient report Patient will work with BSW to address care coordination needs and will continue to work with the clinical team to address health care and disease management related needs as evidenced by documented adherence to scheduled care management/care coordination appointments - check blood pressure weekly - choose a place to take my blood pressure (home, clinic or office, retail store) - write blood pressure results in a log or diary - learn about high blood  pressure - keep a blood pressure log - take blood pressure log to all doctor appointments - call doctor for signs and symptoms of high blood pressure - develop an action plan for high blood pressure - keep all doctor appointments - take medications for blood pressure exactly as prescribed - begin an exercise program - report new symptoms to your doctor - eat more whole grains, fruits and vegetables, lean meats and healthy fats - call for medicine refill 2 or 3 days before it runs out - take all medications exactly as prescribed - call doctor with any symptoms you believe are related to your medicine - call doctor when you experience any new symptoms - go to all doctor appointments as scheduled - adhere to prescribed diet: heart healthy diet        Plan:Telephone follow up appointment with care management team member scheduled for:  05-21-2022 at 345 pm  [place], MSN, [facility] Mobile: [phone]

## 2022-04-10 DIAGNOSIS — I10 Essential (primary) hypertension: Secondary | ICD-10-CM | POA: Diagnosis not present

## 2022-04-10 DIAGNOSIS — D631 Anemia in chronic kidney disease: Secondary | ICD-10-CM | POA: Diagnosis not present

## 2022-04-10 DIAGNOSIS — N1832 Chronic kidney disease, stage 3b: Secondary | ICD-10-CM | POA: Diagnosis not present

## 2022-04-10 DIAGNOSIS — E8722 Chronic metabolic acidosis: Secondary | ICD-10-CM | POA: Diagnosis not present

## 2022-04-14 ENCOUNTER — Other Ambulatory Visit: Payer: Self-pay | Admitting: Nurse Practitioner

## 2022-04-15 ENCOUNTER — Other Ambulatory Visit: Payer: Self-pay | Admitting: Nurse Practitioner

## 2022-04-15 NOTE — Telephone Encounter (Signed)
Requested medications are due for refill today.  yes  Requested medications are on the active medications list.  yes  Last refill. 12/02/2021 #60 4 refills  Future visit scheduled.   yes  Notes to clinic.  Medication refill is not delegated.    Requested Prescriptions  Pending Prescriptions Disp Refills   tiZANidine (ZANAFLEX) 4 MG tablet [Pharmacy Med Name: TIZANIDINE HCL 4 MG TAB] 60 tablet 4    Sig: TAKE 1 TABLET BY MOUTH EVERY 6 HOURS AS NEEDED FOR MUSCLE SPASMS     Not Delegated - Cardiovascular:  Alpha-2 Agonists - tizanidine Failed - 04/15/2022 10:10 AM      Failed - This refill cannot be delegated      Passed - Valid encounter within last 6 months    Recent Outpatient Visits           1 month ago Centrilobular emphysema ([place])   [place] Cannady, Jolene T, NP   7 months ago Schizophrenia, unspecified type ([place])   [place], Jolene T, NP   1 year ago Centrilobular emphysema ([place])   [place], Jolene T, NP   1 year ago Schizophrenia, unspecified type ([place])   [place], Jolene T, NP   1 year ago Abnormal weight loss   [place], Barbaraann Faster, NP       Future Appointments             In 4 months Cannady, Barbaraann Faster, NP [facility]

## 2022-04-15 NOTE — Telephone Encounter (Signed)
Requested medication (s) are due for refill today: Yes  Requested medication (s) are on the active medication list: Yes  Last refill:  03/25/21  Future visit scheduled: Yes  Notes to clinic:  Prescriptions expired.    Requested Prescriptions  Pending Prescriptions Disp Refills   famotidine (PEPCID) 20 MG tablet [Pharmacy Med Name: FAMOTIDINE 20 MG TAB] 90 tablet 4    Sig: TAKE 1 TABLET BY MOUTH DAILY     Gastroenterology:  H2 Antagonists Passed - 04/14/2022 12:21 PM      Passed - Valid encounter within last 12 months    Recent Outpatient Visits           1 month ago Centrilobular emphysema ([place])   [place] Cannady, Denise T, NP   7 months ago Schizophrenia, unspecified type ([place])   [place], Denise T, NP   1 year ago Centrilobular emphysema ([place])   [place], Denise T, NP   1 year ago Schizophrenia, unspecified type ([place])   [place], Denise T, NP   1 year ago Abnormal weight loss   [employer], Denise T, NP       Future Appointments             In 4 months Cannady, Denise Faster, NP [facility]              risperiDONE (RISPERDAL) 2 MG tablet [Pharmacy Med Name: RISPERIDONE 2 MG TAB] 90 tablet 4    Sig: TAKE 1/2 TO 1 TABLET BY MOUTH AT BEDTIME     Not Delegated - Psychiatry:  Antipsychotics - Second Generation (Atypical) - risperidone Failed - 04/14/2022 12:21 PM      Failed - This refill cannot be delegated      Failed - Lipid Panel in normal range within the last 12 months    Cholesterol, Total  Date Value Ref Range Status  02/28/2022 184 100 - 199 mg/dL Final   LDL Chol Calc (NIH)  Date Value Ref Range Status  02/28/2022 115 (H) 0 - 99 mg/dL Final   HDL  Date Value Ref Range Status  02/28/2022 49 >39 mg/dL Final   Triglycerides  Date Value Ref Range Status  02/28/2022 110 0 - 149 mg/dL Final         Failed - CBC within normal  limits and completed in the last 12 months    WBC  Date Value Ref Range Status  04/01/2022 5.9 4.0 - 10.5 K/uL Final   RBC  Date Value Ref Range Status  04/01/2022 3.85 (L) 3.87 - 5.11 MIL/uL Final   Hemoglobin  Date Value Ref Range Status  04/01/2022 12.0 12.0 - 15.0 g/dL Final  02/28/2022 11.2 11.1 - 15.9 g/dL Final   HCT  Date Value Ref Range Status  04/01/2022 36.9 36.0 - 46.0 % Final   Hematocrit  Date Value Ref Range Status  02/28/2022 32.3 (L) 34.0 - 46.6 % Final   MCHC  Date Value Ref Range Status  04/01/2022 32.5 30.0 - 36.0 g/dL Final   [facility]  Date Value Ref Range Status  04/01/2022 31.2 26.0 - 34.0 pg Final   MCV  Date Value Ref Range Status  04/01/2022 95.8 80.0 - 100.0 fL Final  02/28/2022 92 79 - 97 fL Final  09/08/2014 92 80 - 100 fL Final   No results found for: PLTCOUNTKUC, LABPLAT, POCPLA RDW  Date Value Ref Range Status  04/01/2022 13.2 11.5 -  15.5 % Final  02/28/2022 12.9 11.7 - 15.4 % Final  09/08/2014 14.0 11.5 - 14.5 % Final         Failed - CMP within normal limits and completed in the last 12 months    Albumin  Date Value Ref Range Status  04/01/2022 3.9 3.5 - 5.0 g/dL Final  02/28/2022 4.0 3.7 - 4.7 g/dL Final   Albumin ELP  Date Value Ref Range Status  03/25/2021 3.8 2.9 - 4.4 g/dL Final   Alkaline Phosphatase  Date Value Ref Range Status  04/01/2022 49 38 - 126 U/L Final   ALT  Date Value Ref Range Status  04/01/2022 8 0 - 44 U/L Final   AST  Date Value Ref Range Status  04/01/2022 15 15 - 41 U/L Final   BUN  Date Value Ref Range Status  04/01/2022 26 (H) 8 - 23 mg/dL Final  02/28/2022 20 8 - 27 mg/dL Final  06/23/2014 12 7 - 18 mg/dL Final   Calcium  Date Value Ref Range Status  04/01/2022 8.8 (L) 8.9 - 10.3 mg/dL Final   Calcium, Total  Date Value Ref Range Status  06/23/2014 8.1 (L) 8.5 - 10.1 mg/dL Final   CO2  Date Value Ref Range Status  04/01/2022 26 22 - 32 mmol/L Final   Co2  Date Value Ref Range  Status  06/23/2014 21 21 - 32 mmol/L Final   Creatinine  Date Value Ref Range Status  06/23/2014 1.57 (H) 0.60 - 1.30 mg/dL Final   Creatinine, Ser  Date Value Ref Range Status  04/01/2022 1.78 (H) 0.44 - 1.00 mg/dL Final   Glucose  Date Value Ref Range Status  07/21/2017 101  Final  06/23/2014 97 65 - 99 mg/dL Final   Glucose, Bld  Date Value Ref Range Status  04/01/2022 110 (H) 70 - 99 mg/dL Final    Comment:    Glucose reference range applies only to samples taken after fasting for at least 8 hours.   Potassium  Date Value Ref Range Status  04/01/2022 4.4 3.5 - 5.1 mmol/L Final  06/23/2014 5.0 3.5 - 5.1 mmol/L Final   Sodium  Date Value Ref Range Status  04/01/2022 133 (L) 135 - 145 mmol/L Final  02/28/2022 138 134 - 144 mmol/L Final  06/23/2014 129 (L) 136 - 145 mmol/L Final   Total Bilirubin  Date Value Ref Range Status  04/01/2022 0.7 0.3 - 1.2 mg/dL Final   Bilirubin Total  Date Value Ref Range Status  02/28/2022 0.2 0.0 - 1.2 mg/dL Final   Bilirubin, Total  Date Value Ref Range Status  07/21/2017 0.2  Final   Bilirubin, Direct  Date Value Ref Range Status  03/28/2020 0.06 0.00 - 0.40 mg/dL Final   Protein,UA  Date Value Ref Range Status  02/28/2022 Negative Negative/Trace Final   Total Protein, Urine  Date Value Ref Range Status  10/08/2015 <4.0 Not Estab. mg/dL Final   Total Protein  Date Value Ref Range Status  04/01/2022 7.4 6.5 - 8.1 g/dL Final  02/28/2022 6.5 6.0 - 8.5 g/dL Final   Total Protein ELP  Date Value Ref Range Status  08/31/2020 6.9 6.0 - 8.5 g/dL Final   EGFR (African American)  Date Value Ref Range Status  06/23/2014 40 (L)  Final   GFR calc Af Amer  Date Value Ref Range Status  11/06/2020 24 (L) >59 mL/min/1.73 Final    Comment:    **In accordance with recommendations from the NKF-ASN Task force,**     [facility] is in the process of updating its eGFR calculation to the   2021 CKD-EPI creatinine equation that estimates  kidney function   without a race variable.    eGFR  Date Value Ref Range Status  02/28/2022 27 (L) >59 mL/min/1.73 Final   EGFR (Non-African Amer.)  Date Value Ref Range Status  06/23/2014 34 (L)  Final    Comment:    eGFR values <60mL/min/1.73 m2 may be an indication of chronic kidney disease (CKD). Calculated eGFR is useful in patients with stable renal function. The eGFR calculation will not be reliable in acutely ill patients when serum creatinine is changing rapidly. It is not useful in  patients on dialysis. The eGFR calculation may not be applicable to patients at the low and high extremes of body sizes, pregnant women, and vegetarians.    GFR, Estimated  Date Value Ref Range Status  04/01/2022 30 (L) >60 mL/min Final    Comment:    (NOTE) Calculated using the CKD-EPI Creatinine Equation (2021)          Passed - TSH in normal range and within 360 days    TSH  Date Value Ref Range Status  02/28/2022 0.910 0.450 - 4.500 uIU/mL Final         Passed - Completed PHQ-2 or PHQ-9 in the last 360 days      Passed - Last BP in normal range    BP Readings from Last 1 Encounters:  04/01/22 110/87         Passed - Last Heart Rate in normal range    Pulse Readings from Last 1 Encounters:  04/01/22 68         Passed - Valid encounter within last 6 months    Recent Outpatient Visits           1 month ago Centrilobular emphysema ([facility])   [facility] Cannady, Denise T, NP   7 months ago Schizophrenia, unspecified type ([facility])   [facility] Cannady, Denise T, NP   1 year ago Centrilobular emphysema ([facility])   [facility] Cannady, Denise T, NP   1 year ago Schizophrenia, unspecified type ([facility])   [facility] Cannady, Denise T, NP   1 year ago Abnormal weight loss   [facility] Cannady, Denise T, NP       Future Appointments             In 4 months Cannady, Denise T, NP [facility]               

## 2022-05-05 ENCOUNTER — Other Ambulatory Visit: Payer: Self-pay | Admitting: Nurse Practitioner

## 2022-05-06 ENCOUNTER — Encounter: Payer: Self-pay | Admitting: Unknown Physician Specialty

## 2022-05-06 ENCOUNTER — Ambulatory Visit (INDEPENDENT_AMBULATORY_CARE_PROVIDER_SITE_OTHER): Payer: Medicare Other | Admitting: Unknown Physician Specialty

## 2022-05-06 ENCOUNTER — Other Ambulatory Visit: Payer: Self-pay

## 2022-05-06 DIAGNOSIS — F320 Major depressive disorder, single episode, mild: Secondary | ICD-10-CM | POA: Insufficient documentation

## 2022-05-06 DIAGNOSIS — G894 Chronic pain syndrome: Secondary | ICD-10-CM

## 2022-05-06 MED ORDER — ATORVASTATIN CALCIUM 10 MG PO TABS: ORAL_TABLET | ORAL | 4 refills | Status: DC

## 2022-05-06 MED ORDER — GABAPENTIN 300 MG PO CAPS: 300.0000 mg | ORAL_CAPSULE | Freq: Three times a day (TID) | ORAL | 4 refills | Status: DC

## 2022-05-21 ENCOUNTER — Telehealth: Payer: Medicare Other

## 2022-05-21 ENCOUNTER — Ambulatory Visit (INDEPENDENT_AMBULATORY_CARE_PROVIDER_SITE_OTHER): Payer: Medicare Other

## 2022-05-21 DIAGNOSIS — E7800 Pure hypercholesterolemia, unspecified: Secondary | ICD-10-CM

## 2022-05-21 DIAGNOSIS — G894 Chronic pain syndrome: Secondary | ICD-10-CM

## 2022-05-21 DIAGNOSIS — N1832 Chronic kidney disease, stage 3b: Secondary | ICD-10-CM

## 2022-05-21 DIAGNOSIS — F209 Schizophrenia, unspecified: Secondary | ICD-10-CM

## 2022-05-21 DIAGNOSIS — I10 Essential (primary) hypertension: Secondary | ICD-10-CM

## 2022-05-21 DIAGNOSIS — G8929 Other chronic pain: Secondary | ICD-10-CM

## 2022-05-21 DIAGNOSIS — D472 Monoclonal gammopathy: Secondary | ICD-10-CM

## 2022-05-21 NOTE — Patient Instructions (Signed)
Visit Information  Thank you for taking time to visit with me today. Please don't hesitate to contact me if I can be of assistance to you before our next scheduled telephone appointment.  Following are the goals we discussed today:  Chronic Kidney Disease (Status: Goal Met.)  Long Term Goal 05-21-2022 Goals met and care plan is being closed  Last practice recorded BP readings:     BP Readings from Last 3 Encounters:  05/06/22 115/81  04/01/22 110/87  02/28/22 104/69  Most recent eGFR/CrCl:       Lab Results  Component Value Date    EGFR 27 (L) 02/28/2022    No components found for: CRCL   Assessed the patient understanding of chronic kidney disease. 03-07-2022: The patient states she is trying to call the [facility] and doesn't think she has the right number. Called with the patient to [facility] in [place]. The office is closed. Left a detailed message with patients number and the RNCM to assist with appointment being scheduled.  04-09-2022: The patient states that she has a follow up appointment tomorrow with the "kidney doctor". She says she will make this appointment because the "wagon" is going to take her and pick her up. She has already arranged this transportation. She states that she is doing well and denies any issues at this time with her CKD.  Evaluation of current treatment plan related to chronic kidney disease self management and patient's adherence to plan as established by provider. 04-09-2022: Review of CKD today. The patient denies any new concerns with CKD.       Provided education to patient re: stroke prevention, s/s of heart attack and stroke    Reviewed prescribed diet heart healthy/Renal. 03-07-2022: The patient states she is not losing weight anymore and she is maintaining her weight. She was eating at the time of the call. She says her appetite is good. 04-09-2022: The patients weight is stable. She denies any weight loss. She states she is eating  and denies any new concerns with her kidney function. Reviewed medications with patient and discussed importance of compliance. 04-09-2022: The patient states compliance with medications    Counseled on adverse effects of illicit drug and excessive alcohol use in patients with chronic kidney disease. 11-26-2021: The patient denies any alcohol use   Discussed complications of poorly controlled blood pressure such as heart disease, stroke, circulatory complications, vision complications, kidney impairment, sexual dysfunction    Reviewed scheduled/upcoming provider appointments including: Patient has an appointment tomorrow (04-10-2022) with specialist. She has arranged transportation with the "wagon" Advised patient to discuss changes in kidney function  with provider    Discussed plans with patient for ongoing care management follow up and provided patient with direct contact information for care management team    Screening for signs and symptoms of depression related to chronic disease state      Discussed the impact of chronic kidney disease on daily life and mental health and acknowledged and normalized feelings of disempowerment, fear, and frustration    Assessed social determinant of health barriers    Provided education on kidney disease progression    Engage patient in early, proactive and ongoing discussion about goals of care and what matters most to them    Support coping and stress management by recognizing current strategies and assist in developing new strategies such as mindfulness, journaling, relaxation techniques, problem-solving    Assisted the patient with making an appointment for follow up with  the nephrologist on 10-22-2021 at 130 pm. The patient is going to arrange transportation with the "wagon". The patient able to tell RNCM her appointment and promised she would make it. 11-26-2021: The patient missed her appointment with nephrologist on 10-22-2021. The patient states she did not  know she had to renew her transportation benefits. Education given. The RNCM has rescheduled this appointment multiple times. She did not call and cancel her appointment. The patient states she feels fine and she will talk to pcp about it at her next visit in April. 03-07-2022: Called with the patient to [facility] [phone]. Had to leave a message for them to call the patient back for an appointment for follow up. Patients appointment is for 04-10-2022.      MGUS- monoclonal gammopathy of unknown significance   (Status: Goal Met.) Long Term Goal 05-21-2022: Goals met and care plan is being closed. The patient sees specialist. Condition is stable.  Evaluation of current treatment plan related to  MGUS , Limited social support, Transportation, and Mental Health Concerns  self-management and patient's adherence to plan as established by provider. 11-26-2021: The patient did not make her appointment on 10-17-2021. The patient states that her transportation cancelled on her and she did not call and cancel the appointment. The patient has a long standing history of "no shows" to appointments. The patient feels she is doing well and denies any new concerns. 01-28-2022: The patient feels her condition is stable. She has still not went to see the oncologist. She denies any new concerns. Will discuss with pcp in April. 03-07-2022: The patient has low platelets and has a follow up appointment with oncology on 03-18-2022. 04-09-2022: The patient saw oncology recently and her condition is stable. She denies any new concerns related to MGUS.  Discussed plans with patient for ongoing care management follow up and provided patient with direct contact information for care management team Advised patient to keep appointments, and follow the recommendations of the provider. Pcp appointment 09-01-2022 at 120 pm, saw oncology recently Provided education to patient re: the importance of follow up with oncologist due to low  platelet count of 53. The patient verbalized understanding of the importance of keeping her appointment. 04-09-2022: The patient was able to see oncologist and the patient was able to tell the [facility] how her visit went. Denies any new concerns related to MGUS; Reviewed medications with patient and discussed states compliance with medications. 04-09-2022: The patient is compliant with medications.  ; Reviewed scheduled/upcoming provider appointments including : 09-01-2022 at 120 pm with pcp Discussed plans with patient for ongoing care management follow up and provided patient with direct contact information for care management team; Advised patient to discuss changes in platelets, and why they continue to decrease with provider; Screening for signs and symptoms of depression related to chronic disease state;  Assessed social determinant of health barriers;       Schizophrenia   (Status: Goal Met.) Long Term Goal 05-21-2022: Goals met and care plan is being closed  Evaluation of current treatment plan related to  Schizophrenia , Limited social support, Transportation, Level of care concerns, Mental Health Concerns , and Social Isolation self-management and patient's adherence to plan as established by provider. 03-07-2022: The patient states she is doing well. She is sleeping well and feels rested. She is having some pain in her legs but is taking Tylenol for pain relief. She paces her activity and has what she needs. States she still does not have  an appetite but she eats and she feels like she is doing well. Denies any changes in mood, anxiety, or depression. She has her appointments in place and is doing well. 04-09-2022: The patient is frustrated with her family because her sister did not show up to take her for her colonoscopy. She states she was prepared and also was ready. Per the patient her sister got mad at her and would not answer her phone. She says she cannot depend on her family. The patient states  that she has had no support from her sister or brother since their mother died. She denies any other concerns at this time. The patient has an appointment with the kidney specialist tomorrow.  Discussed plans with patient for ongoing care management follow up and provided patient with direct contact information for care management team Advised patient to call the office for changes in mood, anxiety, or depression ; Provided education to patient re: monitoring for changes in mental health and regular follow ups with specialist and pcp ; Reviewed medications with patient and discussed compliance. 04-09-2022: The patient is compliant with her medications ; Reviewed scheduled/upcoming provider appointments including 09-01-2022 Discussed plans with patient for ongoing care management follow up and provided patient with direct contact information for care management team; Advised patient to discuss new concerns or changes in mental health  with provider; Screening for signs and symptoms of depression related to chronic disease state;  Assessed social determinant of health barriers;    Hyperlipidemia:  (Status: Goal Met.) Long Term Goal  05-21-2022: Goals met and care plan is being closed       Lab Results  Component Value Date    CHOL 184 02/28/2022    HDL 49 02/28/2022    LDLCALC 115 (H) 02/28/2022    TRIG 110 02/28/2022      Medication review performed; medication list updated in electronic medical record. 04-09-2022 Patient takes Lipitor 10 mg QD Provider established cholesterol goals reviewed. 04-09-2022: Review of goals. The patient has good numbers. ; Counseled on importance of regular laboratory monitoring as prescribed. 04-09-2022: The patient has regular lab testing for cholesterol levels Provided HLD educational materials; Reviewed role and benefits of statin for ASCVD risk reduction; Discussed strategies to manage statin-induced myalgias; Reviewed importance of limiting foods high in  cholesterol. 11-26-2021: The patient states she is eating, doesn't have an appetite but she is eating and staying hydrated. Denies any acute findings related to dietary restrictions. 03-07-2022: the patient was eating at the time of the call. The patient states she is maintaining her weight and she denies any weight loss. States she feels good. 04-09-2022: The patient is eating well and staying hydrated. The patient states her weight is stable. Denies any acute findings. Will continue to monitor for changes. ;   Hypertension: (Status: Goal Met.) Last practice recorded BP readings:     BP Readings from Last 3 Encounters:  05/06/22 115/81  04/01/22 110/87  02/28/22 104/69  Most recent eGFR/CrCl:       Lab Results  Component Value Date    EGFR 27 (L) 02/28/2022    No components found for: CRCL   Evaluation of current treatment plan related to hypertension self management and patient's adherence to plan as established by provider. 04-09-2022: The patient has normalized blood pressures. Denies any issues with HTN or heart health;   Provided education to patient re: stroke prevention, s/s of heart attack and stroke; Reviewed prescribed diet heart healthy/Renal  Reviewed medications with patient  and discussed importance of compliance. 04-09-2022: Reports compliance with medications. Review of medications.   Discussed plans with patient for ongoing care management follow up and provided patient with direct contact information for care management team; Advised patient, providing education and rationale, to monitor blood pressure daily and record, calling PCP for findings outside established parameters;  Provided education on prescribed diet heart healthy/Renal ;  Discussed complications of poorly controlled blood pressure such as heart disease, stroke, circulatory complications, vision complications, kidney impairment, sexual dysfunction;        Pain:  (Status: Goal Met.) Long Term Goal  05-21-2022:  Goals met and care plan is being closed. The patient saw the pcp at the end of June. The patient had medication changes and the patient is doing well. The patient states her pain level is at an 8 today but has seen a positive change in her level of pain with the medication changes. Denies any acute findings today. Knows to call for changes.  Pain assessment performed. 11-26-2021: The patient states that her pain in her bilateral legs today is 8 on a scale of 0-10. She states it hurts from her thighs down to her feet and especially where she broke her ankle. She had laid back down after taking Tylenol and is feeling much better. Denies any falls or safety concerns. 04-09-2022: denies any pain today Medications reviewed. 01-28-2022: The patient endorses taking Tylenol for pain relief. She states she is not using "goody powders" like she was. She is following the recommendation by the pcp not to take "goody powders".  Reviewed provider established plan for pain management. 04-09-2022: the patient states that she is managing her pain effectively. She denies any acute distress. States she only takes Tylenol when she needs it.  Discussed importance of adherence to all scheduled medical appointments. Office staff to call the patient and secure a new appointment with the pcp; Counseled on the importance of reporting any/all new or changed pain symptoms or management strategies to pain management provider; Advised patient to report to care team affect of pain on daily activities; Discussed use of relaxation techniques and/or diversional activities to assist with pain reduction (distraction, imagery, relaxation, massage, acupressure, TENS, heat, and cold application; Reviewed with patient prescribed pharmacological and nonpharmacological pain relief strategies; Advised patient to discuss unresolved pain, changes in level or intensity of pain with provider; Screening for signs and symptoms of depression related to  chronic disease state;  Assessed social determinant of health barriers;    No further follow up needed. The patient has met the goals of care and the care plan is being closed.  Please call the care guide team at [phone] if you need to schedule an appointment.   If you are experiencing a Mental Health or [place] or need someone to talk to, please call the Suicide and Crisis Lifeline: 988 call the [place] National Suicide Prevention Lifeline: (239)143-3646 or TTY: 5514463603 TTY 404-188-7194) to talk to a trained counselor call 1-800-273-TALK (toll free, 24 hour hotline)   The patient verbalized understanding of instructions, educational materials, and care plan provided today and DECLINED offer to receive copy of patient instructions, educational materials, and care plan.   Noreene Larsson RN, MSN, [facility] Mobile: [phone]

## 2022-05-21 NOTE — Chronic Care Management (AMB) (Signed)
Chronic Care Management   CCM RN Visit Note  05/21/2022 Name: Denise Everett MRN: [id number] DOB: 05/06/1951  Subjective: [name] is a 71 y.o. year old female who is a primary care patient of Cannady, Barbaraann Faster, NP. The care management team was consulted for assistance with disease management and care coordination needs.    Engaged with patient by telephone for follow up visit in response to provider referral for case management and/or care coordination services. Case closure.  Consent to Services:  The patient was given information about Chronic Care Management services, agreed to services, and gave verbal consent prior to initiation of services.  Please see initial visit note for detailed documentation.   Patient agreed to services and verbal consent obtained.   Assessment: Review of patient past medical history, allergies, medications, health status, including review of consultants reports, laboratory and other test data, was performed as part of comprehensive evaluation and provision of chronic care management services.   SDOH (Social Determinants of Health) assessments and interventions performed:    CCM Care Plan  Allergies  Allergen Reactions   No Known Allergies     Outpatient Encounter Medications as of 05/21/2022  Medication Sig   atorvastatin (LIPITOR) 10 MG tablet TAKE 1 TABLET BY MOUTH DAILY AT 6 PM   diclofenac Sodium (VOLTAREN) 1 % GEL APPLY 4 GRAMS TOPICALLY 4 TIMES DAILY   famotidine (PEPCID) 20 MG tablet TAKE 1 TABLET BY MOUTH DAILY   gabapentin (NEURONTIN) 300 MG capsule Take 1 capsule (300 mg total) by mouth 3 (three) times daily.   lisinopril (ZESTRIL) 5 MG tablet TAKE 1 TABLET BY MOUTH DAILY   mirtazapine (REMERON) 15 MG tablet Take 0.5 tablets (7.5 mg total) by mouth at bedtime.   ondansetron (ZOFRAN ODT) 4 MG disintegrating tablet Take 1 tablet (4 mg total) by mouth every 8 (eight) hours as needed for nausea or vomiting.   raloxifene (EVISTA) 60 MG  tablet Take 1 tablet (60 mg total) by mouth daily.   risperiDONE (RISPERDAL) 2 MG tablet TAKE 1/2 TO 1 TABLET BY MOUTH AT BEDTIME   tiZANidine (ZANAFLEX) 4 MG tablet TAKE 1 TABLET BY MOUTH EVERY 6 HOURS AS NEEDED FOR MUSCLE SPASMS   No facility-administered encounter medications on file as of 05/21/2022.    Patient Active Problem List   Diagnosis Date Noted   Depression, major, single episode, mild ([place]) 05/06/2022   Elevated prolactin level 03/01/2022   Hematuria 03/25/2021   Aortic atherosclerosis ([place]) 10/20/2020   Centrilobular emphysema ([place]) 10/20/2020   CKD (chronic kidney disease) stage 3, GFR 30-59 ml/min ([facility]) 02/29/2020   Osteoarthritis of lumbar spine 04/15/2017   Lumbar facet arthropathy 04/15/2017   Lumbar facet syndrome (Bilateral) (R>L) 04/15/2017   Vitamin D insufficiency 03/18/2017   Marijuana use 03/03/2017   Chronic pain syndrome 02/24/2017   Chronic ankle pain  (Location of Tertiary source of pain) (Bilateral) (R>L) 02/24/2017   Abnormal MRI, lumbar spine (06/22/2014) 02/24/2017   Lumbar central spinal stenosis (Severe at L3-4) 02/24/2017   Lumbar foraminal stenosis (Severe: Left L3-4; Right L4-5; Bilateral L5-S1) 02/24/2017   Lumbar lateral recess stenosis (Severe: Left L3-4; Right L4-5; Left L5-S1) 02/24/2017   Personal history of tobacco use, presenting hazards to health 02/03/2017   Advance care planning 10/08/2016   MGUS (monoclonal gammopathy of unknown significance) 06/23/2016   Schizophrenia ([place]) 06/07/2015   Osteoporosis 06/07/2015   Chronic low back pain (Location of Primary Source of Pain) (Bilateral) (R>L) 06/07/2015   Idiopathic thrombocytopenic purpura (  [place]) 06/07/2015   Allergic rhinitis 06/07/2015   Chronic leg pain 06/07/2015   Hyperlipidemia 06/07/2015   Hypertension 06/07/2015   Lumbar IVDD (intervertebral disc displacement) 09/29/2014   Neuritis or radiculitis due to rupture of lumbar intervertebral disc 09/29/2014   Chronic Lumbar  radiculitis 09/29/2014   Lumbar HNP (herniated nucleus pulposus) 09/29/2014    Conditions to be addressed/monitored:HTN, HLD, CKD Stage 3, and MGUS, schizophrenia and chronic pain   Care Plan : RNCM: General Plan of Care (Adult) for Chronic Disease Management and Care Coordination Needs  Updates made by Vanita Ingles, RN since 05/21/2022 12:00 AM     Problem: RNCM; Development of Plan of Care for Chronic Disease Management (HTN, HLD, CKD3, MGUS, and Schizophrenia, Chronic Pain )   Priority: High     Long-Range Goal: RNCM: Effective Management of Plan of Care for Chronic Disease Management (HTN, HLD, CKD3, MGUS, and Schizophrenia, Chronic pain) Completed 05/21/2022  Start Date: 09/24/2021  Expected End Date: 09/24/2022  Priority: High  Note:   Current Barriers: 05-21-2022: Goals met and care plan is being closed  Knowledge Deficits related to plan of care for management of HTN, HLD, CKD Stage 3, and Schizophrenia and MGUS, Chronic Pain Care Coordination needs related to Limited social support, Transportation, Mental Health Concerns , Social Isolation, and Literacy concerns  Chronic Disease Management support and education needs related to HTN, HLD, CKD Stage 3, and Shizophrenia and MGUS, Chronic pain  Lacks caregiver support.        Literacy barriers Transportation barriers- 11-26-2021- has missed several appointments. States she sets up transportation but then transportation tells her she has to renew. Have had issues with transportation for a long time. Review and education done. 01-28-2022: Review of upcoming appointments and transportation needs. The patient states she has secured transportation and her sister is going with her to her appointments. 03-07-2022: Review of upcoming appointment for colonoscopy and the patient states that her sister is going with her when she has her colonoscopy. 04-09-2022: The colonoscopy was cancelled for 03-25-2022. The patient was no show for 04-08-2022. The  patient states her sister got mad at her and would not take her. Spoke with the patient via phone today.  Non-adherence to scheduled provider appointments Non-adherence to prescribed medication regimen  RNCM Clinical Goal(s):  Patient will verbalize basic understanding of HTN, HLD, CKD Stage Schizophrenia , and MGUS, Chronic pain,  disease process and self health management plan as evidenced by keeping appointments, following plan of care and taking medications as directed  take all medications exactly as prescribed and will call provider for medication related questions as evidenced by compliance    attend all scheduled medical appointments: 10-17-2021 at 1 pm with oncology and 10-22-2021 at 130 pm with nephrology as evidenced by keeping appointments and calling if she cannot make the appointment. 11-26-2021: The patient missed both appointments. The patient states she had established transportation but they cancelled on her. She did not call and cancel appointments. The patient has a long standing history of non-compliance with follow up visits. The patient needs a rescheduled appointment with pcp in April. [place] office to call and schedule appointment with the patient.  01-28-2022: The patient states that she has transportation set up for her upcoming appointments. She states that she has a mammogram on 4-4 and pcp on 4-21 and colonoscopy on 5-16. She states that her sister is going to take her to her appointments. Will continue to monitor for changes. 03-07-2022: The patient has an  upcoming appointment for colonoscopy on 03-25-2022 and states her sister is going with her. She has a follow up with oncology on 03-18-2022, and called with the patient today and left a message for [facility] to call her for an appointment. Attempted to help patient make an appointment today but had to leave a message on voicemail.  04-09-2022: Spoke to the patient today and she missed her colonoscopy yesterday  because her "sister was mad at me". She had done the bowel prep and was ready to be picked up. Her sister would not answer her phone until late last night when she told the patient she thought someone else was going to take her. The patient states she cannot depend on  her family to help her.      demonstrate improved and ongoing adherence to prescribed treatment plan for HTN, HLD, CKD Stage 3, and Schizophrenia and MGUS, and chronic pain  as evidenced by working with the CCM team to optimize plan of care for effective management of health and well being  continue to work with Consulting civil engineer and/or Social Worker to address care management and care coordination needs related to HTN, HLD, CKD Stage 3, and Schizophrenia and MGUS, and chronic pain, as evidenced by adherence to CM Team Scheduled appointments     demonstrate ongoing self health care management ability for effective management of chronic conditions  as evidenced by  working with the CCM team through collaboration with Consulting civil engineer, provider, and care team.   Interventions: 1:1 collaboration with primary care provider regarding development and update of comprehensive plan of care as evidenced by provider attestation and co-signature Inter-disciplinary care team collaboration (see longitudinal plan of care) Evaluation of current treatment plan related to  self management and patient's adherence to plan as established by provider   Chronic Kidney Disease (Status: Goal Met.)  Long Term Goal 05-21-2022 Goals met and care plan is being closed  Last practice recorded BP readings:  BP Readings from Last 3 Encounters:  05/06/22 115/81  04/01/22 110/87  02/28/22 104/69  Most recent eGFR/CrCl:  Lab Results  Component Value Date   EGFR 27 (L) 02/28/2022    No components found for: CRCL  Assessed the patient understanding of chronic kidney disease. 03-07-2022: The patient states she is trying to call the [facility] and doesn't  think she has the right number. Called with the patient to [facility] in [place]. The office is closed. Left a detailed message with patients number and the RNCM to assist with appointment being scheduled.  04-09-2022: The patient states that she has a follow up appointment tomorrow with the "kidney doctor". She says she will make this appointment because the "wagon" is going to take her and pick her up. She has already arranged this transportation. She states that she is doing well and denies any issues at this time with her CKD.  Evaluation of current treatment plan related to chronic kidney disease self management and patient's adherence to plan as established by provider. 04-09-2022: Review of CKD today. The patient denies any new concerns with CKD.       Provided education to patient re: stroke prevention, s/s of heart attack and stroke    Reviewed prescribed diet heart healthy/Renal. 03-07-2022: The patient states she is not losing weight anymore and she is maintaining her weight. She was eating at the time of the call. She says her appetite is good. 04-09-2022: The patients weight is stable.  She denies any weight loss. She states she is eating and denies any new concerns with her kidney function. Reviewed medications with patient and discussed importance of compliance. 04-09-2022: The patient states compliance with medications    Counseled on adverse effects of illicit drug and excessive alcohol use in patients with chronic kidney disease. 11-26-2021: The patient denies any alcohol use   Discussed complications of poorly controlled blood pressure such as heart disease, stroke, circulatory complications, vision complications, kidney impairment, sexual dysfunction    Reviewed scheduled/upcoming provider appointments including: Patient has an appointment tomorrow (04-10-2022) with specialist. She has arranged transportation with the "wagon" Advised patient to discuss changes in kidney function  with  provider    Discussed plans with patient for ongoing care management follow up and provided patient with direct contact information for care management team    Screening for signs and symptoms of depression related to chronic disease state      Discussed the impact of chronic kidney disease on daily life and mental health and acknowledged and normalized feelings of disempowerment, fear, and frustration    Assessed social determinant of health barriers    Provided education on kidney disease progression    Engage patient in early, proactive and ongoing discussion about goals of care and what matters most to them    Support coping and stress management by recognizing current strategies and assist in developing new strategies such as mindfulness, journaling, relaxation techniques, problem-solving    Assisted the patient with making an appointment for follow up with the nephrologist on 10-22-2021 at 130 pm. The patient is going to arrange transportation with the "wagon". The patient able to tell RNCM her appointment and promised she would make it. 11-26-2021: The patient missed her appointment with nephrologist on 10-22-2021. The patient states she did not know she had to renew her transportation benefits. Education given. The RNCM has rescheduled this appointment multiple times. She did not call and cancel her appointment. The patient states she feels fine and she will talk to pcp about it at her next visit in April. 03-07-2022: Called with the patient to [facility] [phone]. Had to leave a message for them to call the patient back for an appointment for follow up. Patients appointment is for 04-10-2022.    MGUS- monoclonal gammopathy of unknown significance   (Status: Goal Met.) Long Term Goal 05-21-2022: Goals met and care plan is being closed. The patient sees specialist. Condition is stable.  Evaluation of current treatment plan related to  MGUS , Limited social support, Transportation, and  Mental Health Concerns  self-management and patient's adherence to plan as established by provider. 11-26-2021: The patient did not make her appointment on 10-17-2021. The patient states that her transportation cancelled on her and she did not call and cancel the appointment. The patient has a long standing history of "no shows" to appointments. The patient feels she is doing well and denies any new concerns. 01-28-2022: The patient feels her condition is stable. She has still not went to see the oncologist. She denies any new concerns. Will discuss with pcp in April. 03-07-2022: The patient has low platelets and has a follow up appointment with oncology on 03-18-2022. 04-09-2022: The patient saw oncology recently and her condition is stable. She denies any new concerns related to MGUS.  Discussed plans with patient for ongoing care management follow up and provided patient with direct contact information for care management team Advised patient to keep appointments, and follow the recommendations  of the provider. Pcp appointment 09-01-2022 at 120 pm, saw oncology recently Provided education to patient re: the importance of follow up with oncologist due to low platelet count of 53. The patient verbalized understanding of the importance of keeping her appointment. 04-09-2022: The patient was able to see oncologist and the patient was able to tell the [facility] how her visit went. Denies any new concerns related to MGUS; Reviewed medications with patient and discussed states compliance with medications. 04-09-2022: The patient is compliant with medications.  ; Reviewed scheduled/upcoming provider appointments including : 09-01-2022 at 120 pm with pcp Discussed plans with patient for ongoing care management follow up and provided patient with direct contact information for care management team; Advised patient to discuss changes in platelets, and why they continue to decrease with provider; Screening for signs and symptoms  of depression related to chronic disease state;  Assessed social determinant of health barriers;     Schizophrenia   (Status: Goal Met.) Long Term Goal 05-21-2022: Goals met and care plan is being closed  Evaluation of current treatment plan related to  Schizophrenia , Limited social support, Transportation, Level of care concerns, Mental Health Concerns , and Social Isolation self-management and patient's adherence to plan as established by provider. 03-07-2022: The patient states she is doing well. She is sleeping well and feels rested. She is having some pain in her legs but is taking Tylenol for pain relief. She paces her activity and has what she needs. States she still does not have an appetite but she eats and she feels like she is doing well. Denies any changes in mood, anxiety, or depression. She has her appointments in place and is doing well. 04-09-2022: The patient is frustrated with her family because her sister did not show up to take her for her colonoscopy. She states she was prepared and also was ready. Per the patient her sister got mad at her and would not answer her phone. She says she cannot depend on her family. The patient states that she has had no support from her sister or brother since their mother died. She denies any other concerns at this time. The patient has an appointment with the kidney specialist tomorrow.  Discussed plans with patient for ongoing care management follow up and provided patient with direct contact information for care management team Advised patient to call the office for changes in mood, anxiety, or depression ; Provided education to patient re: monitoring for changes in mental health and regular follow ups with specialist and pcp ; Reviewed medications with patient and discussed compliance. 04-09-2022: The patient is compliant with her medications ; Reviewed scheduled/upcoming provider appointments including 09-01-2022 Discussed plans with patient for  ongoing care management follow up and provided patient with direct contact information for care management team; Advised patient to discuss new concerns or changes in mental health  with provider; Screening for signs and symptoms of depression related to chronic disease state;  Assessed social determinant of health barriers;   Hyperlipidemia:  (Status: Goal Met.) Long Term Goal  05-21-2022: Goals met and care plan is being closed  Lab Results  Component Value Date   CHOL 184 02/28/2022   HDL 49 02/28/2022   LDLCALC 115 (H) 02/28/2022   TRIG 110 02/28/2022     Medication review performed; medication list updated in electronic medical record. 04-09-2022 Patient takes Lipitor 10 mg QD Provider established cholesterol goals reviewed. 04-09-2022: Review of goals. The patient has good numbers. ; Counseled on importance  of regular laboratory monitoring as prescribed. 04-09-2022: The patient has regular lab testing for cholesterol levels Provided HLD educational materials; Reviewed role and benefits of statin for ASCVD risk reduction; Discussed strategies to manage statin-induced myalgias; Reviewed importance of limiting foods high in cholesterol. 11-26-2021: The patient states she is eating, doesn't have an appetite but she is eating and staying hydrated. Denies any acute findings related to dietary restrictions. 03-07-2022: the patient was eating at the time of the call. The patient states she is maintaining her weight and she denies any weight loss. States she feels good. 04-09-2022: The patient is eating well and staying hydrated. The patient states her weight is stable. Denies any acute findings. Will continue to monitor for changes. ;  Hypertension: (Status: Goal Met.) Last practice recorded BP readings:  BP Readings from Last 3 Encounters:  05/06/22 115/81  04/01/22 110/87  02/28/22 104/69  Most recent eGFR/CrCl:  Lab Results  Component Value Date   EGFR 27 (L) 02/28/2022    No components  found for: CRCL  Evaluation of current treatment plan related to hypertension self management and patient's adherence to plan as established by provider. 04-09-2022: The patient has normalized blood pressures. Denies any issues with HTN or heart health;   Provided education to patient re: stroke prevention, s/s of heart attack and stroke; Reviewed prescribed diet heart healthy/Renal  Reviewed medications with patient and discussed importance of compliance. 04-09-2022: Reports compliance with medications. Review of medications.   Discussed plans with patient for ongoing care management follow up and provided patient with direct contact information for care management team; Advised patient, providing education and rationale, to monitor blood pressure daily and record, calling PCP for findings outside established parameters;  Provided education on prescribed diet heart healthy/Renal ;  Discussed complications of poorly controlled blood pressure such as heart disease, stroke, circulatory complications, vision complications, kidney impairment, sexual dysfunction;     Pain:  (Status: Goal Met.) Long Term Goal  05-21-2022: Goals met and care plan is being closed. The patient saw the pcp at the end of June. The patient had medication changes and the patient is doing well. The patient states her pain level is at an 8 today but has seen a positive change in her level of pain with the medication changes. Denies any acute findings today. Knows to call for changes.  Pain assessment performed. 11-26-2021: The patient states that her pain in her bilateral legs today is 8 on a scale of 0-10. She states it hurts from her thighs down to her feet and especially where she broke her ankle. She had laid back down after taking Tylenol and is feeling much better. Denies any falls or safety concerns. 04-09-2022: denies any pain today Medications reviewed. 01-28-2022: The patient endorses taking Tylenol for pain relief. She states  she is not using "goody powders" like she was. She is following the recommendation by the pcp not to take "goody powders".  Reviewed provider established plan for pain management. 04-09-2022: the patient states that she is managing her pain effectively. She denies any acute distress. States she only takes Tylenol when she needs it.  Discussed importance of adherence to all scheduled medical appointments. Office staff to call the patient and secure a new appointment with the pcp; Counseled on the importance of reporting any/all new or changed pain symptoms or management strategies to pain management provider; Advised patient to report to care team affect of pain on daily activities; Discussed use of relaxation techniques and/or diversional  activities to assist with pain reduction (distraction, imagery, relaxation, massage, acupressure, TENS, heat, and cold application; Reviewed with patient prescribed pharmacological and nonpharmacological pain relief strategies; Advised patient to discuss unresolved pain, changes in level or intensity of pain with provider; Screening for signs and symptoms of depression related to chronic disease state;  Assessed social determinant of health barriers;    Patient Goals/Self-Care Activities: Patient will self administer medications as prescribed as evidenced by self report/primary caregiver report  Patient will attend all scheduled provider appointments as evidenced by clinician review of documented attendance to scheduled appointments and patient/caregiver report Patient will call pharmacy for medication refills as evidenced by patient report and review of pharmacy fill history as appropriate Patient will attend church or other social activities as evidenced by patient report Patient will continue to perform ADL's independently as evidenced by patient/caregiver report Patient will continue to perform IADL's independently as evidenced by patient/caregiver  report Patient will call provider office for new concerns or questions as evidenced by review of documented incoming telephone call notes and patient report Patient will work with BSW to address care coordination needs and will continue to work with the clinical team to address health care and disease management related needs as evidenced by documented adherence to scheduled care management/care coordination appointments - check blood pressure weekly - choose a place to take my blood pressure (home, clinic or office, retail store) - write blood pressure results in a log or diary - learn about high blood pressure - keep a blood pressure log - take blood pressure log to all doctor appointments - call doctor for signs and symptoms of high blood pressure - develop an action plan for high blood pressure - keep all doctor appointments - take medications for blood pressure exactly as prescribed - begin an exercise program - report new symptoms to your doctor - eat more whole grains, fruits and vegetables, lean meats and healthy fats - call for medicine refill 2 or 3 days before it runs out - take all medications exactly as prescribed - call doctor with any symptoms you believe are related to your medicine - call doctor when you experience any new symptoms - go to all doctor appointments as scheduled - adhere to prescribed diet: heart healthy diet        Plan:No further follow up required: the patient has met the goals of care and no further follow up needed at this time. The patient knows to call for changes or new needs.   Noreene Larsson RN, MSN, [facility] Mobile: [phone]

## 2022-06-05 ENCOUNTER — Other Ambulatory Visit: Payer: Self-pay | Admitting: Nurse Practitioner

## 2022-06-06 NOTE — Telephone Encounter (Signed)
Requested Prescriptions  Pending Prescriptions Disp Refills  . raloxifene (EVISTA) 60 MG tablet [Pharmacy Med Name: RALOXIFENE HCL 60 MG TAB] 90 tablet 1    Sig: TAKE 1 TABLET BY MOUTH DAILY     OB/GYN: Selective Estrogen Receptor Modulators 2 Failed - 06/05/2022 11:24 AM      Failed - Ca in normal range and within 360 days    Calcium  Date Value Ref Range Status  04/01/2022 8.8 (L) 8.9 - 10.3 mg/dL Final   Calcium, Total  Date Value Ref Range Status  06/23/2014 8.1 (L) 8.5 - 10.1 mg/dL Final         Failed - Bone Mineral Density or Dexa Scan completed in the last 2 years      Passed - Valid encounter within last 12 months    Recent Outpatient Visits          1 month ago Chronic pain syndrome   [facility] [name], NP   3 months ago Centrilobular emphysema ([place])   [place], Jolene T, NP   9 months ago Schizophrenia, unspecified type ([place])   [place], Barbaraann Faster, NP   1 year ago Centrilobular emphysema ([place])   [place], Jolene T, NP   1 year ago Schizophrenia, unspecified type ([place])   [place], Barbaraann Faster, NP      Future Appointments            In 2 months [place], Barbaraann Faster, NP [employer]

## 2022-06-09 DIAGNOSIS — E7800 Pure hypercholesterolemia, unspecified: Secondary | ICD-10-CM | POA: Diagnosis not present

## 2022-06-09 DIAGNOSIS — I10 Essential (primary) hypertension: Secondary | ICD-10-CM | POA: Diagnosis not present

## 2022-06-21 NOTE — Progress Notes (Deleted)
[place]  [phone] [phone]  ID: Denise Everett OB: Sep 04, 1951  MR#: [id number]  VVK#:[id number]  Patient Care Team: Venita Lick, NP as PCP - General (Nurse Practitioner) Lloyd Huger, MD as Consulting Physician (Oncology)  CHIEF COMPLAINT: MGUS and ITP  INTERVAL HISTORY: Patient last evaluated in clinic in January 2019.  She is referred back for further evaluation of her platelet count and MGUS.  She continues to have weight loss of unknown etiology, but otherwise feels well.  She has no neurologic complaints.  She denies any recent fevers or illnesses.  She denies any easy bleeding or bruising.  She denies any chest pain, shortness of breath, cough, or hemoptysis.  She denies any nausea, vomiting, constipation, or diarrhea.  She has no melena or hematochezia.  She has no urinary complaints.  Patient offers no further specific complaints today.  REVIEW OF SYSTEMS:   Review of Systems  Constitutional:  Positive for weight loss. Negative for fever and malaise/fatigue.  Respiratory: Negative.  Negative for cough and shortness of breath.   Cardiovascular: Negative.  Negative for chest pain and leg swelling.  Gastrointestinal: Negative.  Negative for abdominal pain, blood in stool and melena.  Genitourinary: Negative.  Negative for dysuria.  Musculoskeletal: Negative.  Negative for back pain.  Skin: Negative.  Negative for rash.  Neurological: Negative.  Negative for dizziness, focal weakness, weakness and headaches.  Endo/Heme/Allergies:  Does not bruise/bleed easily.  Psychiatric/Behavioral: Negative.  The patient is not nervous/anxious.     As per HPI. Otherwise, a complete review of systems is negative.  PAST MEDICAL HISTORY: Past Medical History:  Diagnosis Date   Abnormal weight loss 06/07/2015   Allergy    Cataract    Chronic kidney disease    Chronic pain syndrome    Hyperlipidemia    Hypertension    Leg pain     Osteoporosis    hips   Renal insufficiency    Schizophrenia (HCC)    Thrombocytopenia (HCC)    Tobacco use disorder     PAST SURGICAL HISTORY: Past Surgical History:  Procedure Laterality Date   ankle surgery Right    x's 2   CATARACT EXTRACTION      FAMILY HISTORY Family History  Problem Relation Age of Onset   Hyperlipidemia Mother    Hypertension Mother        ADVANCED DIRECTIVES:    HEALTH MAINTENANCE: Social History   Tobacco Use   Smoking status: Former    Packs/day: 1.00    Years: 43.00    Total pack years: 43.00    Types: Cigarettes    Quit date: 07/21/2017    Years since quitting: 4.9   Smokeless tobacco: Former    Types: Snuff  Vaping Use   Vaping Use: Never used  Substance Use Topics   Alcohol use: No   Drug use: No     Colonoscopy:  PAP:  Bone density:  Lipid panel:  Allergies  Allergen Reactions   No Known Allergies     Current Outpatient Medications  Medication Sig Dispense Refill   atorvastatin (LIPITOR) 10 MG tablet TAKE 1 TABLET BY MOUTH DAILY AT 6 PM 90 tablet 4   diclofenac Sodium (VOLTAREN) 1 % GEL APPLY 4 GRAMS TOPICALLY 4 TIMES DAILY 100 g 4   famotidine (PEPCID) 20 MG tablet TAKE 1 TABLET BY MOUTH DAILY 90 tablet 4   gabapentin (NEURONTIN) 300 MG capsule Take 1 capsule (300 mg total) by mouth 3 (  three) times daily. 180 capsule 4   lisinopril (ZESTRIL) 5 MG tablet TAKE 1 TABLET BY MOUTH DAILY 90 tablet 1   mirtazapine (REMERON) 15 MG tablet Take 0.5 tablets (7.5 mg total) by mouth at bedtime. 45 tablet 4   ondansetron (ZOFRAN ODT) 4 MG disintegrating tablet Take 1 tablet (4 mg total) by mouth every 8 (eight) hours as needed for nausea or vomiting. 20 tablet 0   raloxifene (EVISTA) 60 MG tablet TAKE 1 TABLET BY MOUTH DAILY 90 tablet 1   risperiDONE (RISPERDAL) 2 MG tablet TAKE 1/2 TO 1 TABLET BY MOUTH AT BEDTIME 90 tablet 4   tiZANidine (ZANAFLEX) 4 MG tablet TAKE 1 TABLET BY MOUTH EVERY 6 HOURS AS NEEDED FOR MUSCLE SPASMS 60  tablet 4   No current facility-administered medications for this visit.    OBJECTIVE: There were no vitals filed for this visit.    There is no height or weight on file to calculate BMI.    ECOG FS:0 - Asymptomatic  General: Well-developed, well-nourished, no acute distress. Eyes: Pink conjunctiva, anicteric sclera. HEENT: Normocephalic, moist mucous membranes. Lungs: No audible wheezing or coughing. Heart: Regular rate and rhythm. Abdomen: Soft, nontender, no obvious distention. Musculoskeletal: No edema, cyanosis, or clubbing. Neuro: Alert, answering all questions appropriately. Cranial nerves grossly intact. Skin: No rashes or petechiae noted. Psych: Normal affect.    LAB RESULTS:  Lab Results  Component Value Date   NA 133 (L) 04/01/2022   K 4.4 04/01/2022   CL 103 04/01/2022   CO2 26 04/01/2022   GLUCOSE 110 (H) 04/01/2022   BUN 26 (H) 04/01/2022   CREATININE 1.78 (H) 04/01/2022   CALCIUM 8.8 (L) 04/01/2022   PROT 7.4 04/01/2022   ALBUMIN 3.9 04/01/2022   AST 15 04/01/2022   ALT 8 04/01/2022   ALKPHOS 49 04/01/2022   BILITOT 0.7 04/01/2022   GFRNONAA 30 (L) 04/01/2022   GFRAA 24 (L) 11/06/2020    Lab Results  Component Value Date   WBC 5.9 04/01/2022   NEUTROABS 2.9 04/01/2022   HGB 12.0 04/01/2022   HCT 36.9 04/01/2022   MCV 95.8 04/01/2022   PLT 68 (L) 04/01/2022     STUDIES: No results found.   ASSESSMENT: MGUS and ITP.  PLAN:    1.  ITP: Previously, bone marrow biopsy confirmed the results.  Patient platelet count has mildly improved and is now greater than 100.  No intervention is needed at this time.   She last received Rituxan in September 2013.  Patient had a response to prednisone, but it was not durable.  Return to clinic in 3 months with repeat laboratory work and further evaluation. 2. MGUS:  Bone marrow biopsy revealed less than 5% plasma cells.  His recent M spike was stable at 0.7.  Repeat SPEP from today is pending.  Return to  clinic as above with repeat laboratory work and further evaluation. 3. Weight loss: Although patient's weight is decreased from earlier this year, it has increased several pounds in the last month.  Repeat laboratory work as above.  Consider CT scans in the future if weight loss continues.  I spent a total of 30 minutes reviewing chart data, face-to-face evaluation with the patient, counseling and coordination of care as detailed above.   Patient expressed understanding and was in agreement with this plan. She also understands that She can call clinic at any time with any questions, concerns, or complaints.    Lloyd Huger, MD   06/21/2022 5:59  PM

## 2022-06-23 ENCOUNTER — Other Ambulatory Visit: Payer: Self-pay

## 2022-06-23 DIAGNOSIS — D693 Immune thrombocytopenic purpura: Secondary | ICD-10-CM

## 2022-06-23 DIAGNOSIS — D472 Monoclonal gammopathy: Secondary | ICD-10-CM

## 2022-06-26 ENCOUNTER — Inpatient Hospital Stay: Payer: Medicare Other | Attending: Nurse Practitioner

## 2022-06-26 ENCOUNTER — Inpatient Hospital Stay: Payer: Medicare Other | Admitting: Oncology

## 2022-06-26 DIAGNOSIS — D472 Monoclonal gammopathy: Secondary | ICD-10-CM

## 2022-06-26 DIAGNOSIS — D693 Immune thrombocytopenic purpura: Secondary | ICD-10-CM

## 2022-08-31 NOTE — Patient Instructions (Signed)

## 2022-09-01 ENCOUNTER — Ambulatory Visit (INDEPENDENT_AMBULATORY_CARE_PROVIDER_SITE_OTHER): Payer: Medicare Other | Admitting: Nurse Practitioner

## 2022-09-01 ENCOUNTER — Encounter: Payer: Self-pay | Admitting: Nurse Practitioner

## 2022-09-01 VITALS — BP 106/75 | HR 89 | Temp 99.3°F | Ht 59.49 in | Wt 132.8 lb

## 2022-09-01 DIAGNOSIS — F320 Major depressive disorder, single episode, mild: Secondary | ICD-10-CM | POA: Diagnosis not present

## 2022-09-01 DIAGNOSIS — I700 Atherosclerosis of aorta: Secondary | ICD-10-CM | POA: Diagnosis not present

## 2022-09-01 DIAGNOSIS — I10 Essential (primary) hypertension: Secondary | ICD-10-CM

## 2022-09-01 DIAGNOSIS — F209 Schizophrenia, unspecified: Secondary | ICD-10-CM

## 2022-09-01 DIAGNOSIS — N184 Chronic kidney disease, stage 4 (severe): Secondary | ICD-10-CM | POA: Diagnosis not present

## 2022-09-01 DIAGNOSIS — R7989 Other specified abnormal findings of blood chemistry: Secondary | ICD-10-CM

## 2022-09-01 DIAGNOSIS — D472 Monoclonal gammopathy: Secondary | ICD-10-CM

## 2022-09-01 DIAGNOSIS — J432 Centrilobular emphysema: Secondary | ICD-10-CM

## 2022-09-01 DIAGNOSIS — Z23 Encounter for immunization: Secondary | ICD-10-CM

## 2022-09-01 DIAGNOSIS — G894 Chronic pain syndrome: Secondary | ICD-10-CM | POA: Diagnosis not present

## 2022-09-01 DIAGNOSIS — M5442 Lumbago with sciatica, left side: Secondary | ICD-10-CM | POA: Diagnosis not present

## 2022-09-01 DIAGNOSIS — M5441 Lumbago with sciatica, right side: Secondary | ICD-10-CM

## 2022-09-01 DIAGNOSIS — E559 Vitamin D deficiency, unspecified: Secondary | ICD-10-CM

## 2022-09-01 DIAGNOSIS — R3121 Asymptomatic microscopic hematuria: Secondary | ICD-10-CM

## 2022-09-01 DIAGNOSIS — G8929 Other chronic pain: Secondary | ICD-10-CM

## 2022-09-01 DIAGNOSIS — E7800 Pure hypercholesterolemia, unspecified: Secondary | ICD-10-CM | POA: Diagnosis not present

## 2022-09-01 DIAGNOSIS — D693 Immune thrombocytopenic purpura: Secondary | ICD-10-CM | POA: Diagnosis not present

## 2022-09-01 LAB — URINALYSIS, ROUTINE W REFLEX MICROSCOPIC
Bilirubin, UA: NEGATIVE
Glucose, UA: NEGATIVE
Ketones, UA: NEGATIVE
Leukocytes,UA: NEGATIVE
Nitrite, UA: NEGATIVE
Protein,UA: NEGATIVE
Specific Gravity, UA: 1.01 (ref 1.005–1.030)
Urobilinogen, Ur: 0.2 mg/dL (ref 0.2–1.0)
pH, UA: 5.5 (ref 5.0–7.5)

## 2022-09-01 LAB — MICROSCOPIC EXAMINATION

## 2022-09-01 MED ORDER — PREGABALIN 25 MG PO CAPS: 25.0000 mg | ORAL_CAPSULE | Freq: Two times a day (BID) | ORAL | 0 refills | Status: DC

## 2022-09-01 NOTE — Assessment & Plan Note (Signed)
Ongoing.  Noted on CT 02/03/2017.  Is a former smoker, reports quitting >5 years ago.  No current inhalers.  Would benefit from spirometry in future, refuses today.  Has repeat lung CT ordered, but has not attended or scheduled this yet -- highly recommend she scheduled this as soon as possible -- she missed scheduled scans.  Will continue to work closely with CCM team on assist.

## 2022-09-01 NOTE — Assessment & Plan Note (Signed)
Chronic, ongoing.  Continue current medication regimen and adjust as needed. Lipid panel today. 

## 2022-09-01 NOTE — Assessment & Plan Note (Signed)
Chronic, stable.  BP well below goal today. Continue current medication regimen and adjust as needed.  Recommend she check BP at home at least 3 mornings a week and document.  DASH diet focus.  If ongoing lower reading next visit, consider discontinuation or reduction to 2.5 MG of Lisinopril.  LABS: CMP and CBC.

## 2022-09-01 NOTE — Assessment & Plan Note (Signed)
Chronic, ongoing.  At this time will stop Gabapentin, as offering no benefit, and start renal dosed Lyrica 25 MG BID.  Discussed with her at length and educated on this medication. Continue Tylenol as needed.  May need return to pain clinic in future.

## 2022-09-01 NOTE — Progress Notes (Signed)
BP 106/75   Pulse 89   Temp 99.3 F (37.4 C) (Oral)   Ht 4' 11.49" (1.511 m)   Wt 132 lb 12.8 oz (60.2 kg)   LMP  (LMP Unknown)   SpO2 98%   BMI 26.38 kg/m    Subjective:    Patient ID: [name], female    DOB: [birth year], 71 y.o.   MRN: [id number]  HPI: CHESNEY SUARES is a 71 y.o. female  Chief Complaint  Patient presents with   COPD   Hypertension   Hyperlipidemia   Chronic Kidney Disease   Anxiety   Depression   MGUS    MGUS/THROMBOCYTOPENIA Has not had colonoscopy, she has not been able because her relatives have not been attending with her. She saw GI on 05/01/20 last. Have been working with CCM team on assistance with this.   Followed by Dr. Grayland Ormond for MGUS and ITP, did see for follow-up on 04/01/22 and is to return in 6 months -- last CBC platelet was 68.  Was to have recent labs with them but missed this, will obtain today. Fever: no Nausea: no Vomiting: no Weight loss: no, maintaining Decreased appetite: no Diarrhea: no Constipation: no Blood in stool: no Heartburn: yes Jaundice: no Rash: no Dysuria/urinary frequency: no Hematuria: no History of sexually transmitted disease: no Recurrent NSAID use: no   SCHIZOPHRENIA Taking Mirtazapine and Risperdal for mood.  Last visit Prolactin level 250. Mood status: stable Satisfied with current treatment?: yes Symptom severity: moderate  Duration of current treatment : chronic Side effects: no Medication compliance: good compliance Previous psychiatric medications: multiple Depressed mood: no Anxious mood: no Anhedonia: no Significant weight loss or gain: no Insomnia: none Fatigue: no Feelings of worthlessness or guilt: no Impaired concentration/indecisiveness: no Suicidal ideations: no Hopelessness: no Crying spells: no    09/01/2022    1:32 PM 05/06/2022    2:57 PM 05/06/2022    2:10 PM 11/27/2021   12:18 PM 03/25/2021    1:13 PM  Depression screen PHQ 2/9  Decreased Interest '3 2 2 '$ 0 3   Down, Depressed, Hopeless 0 0 0 0 0  PHQ - 2 Score '3 2 2 '$ 0 3  Altered sleeping 2 2 0  0  Tired, decreased energy '2 2 2  '$ 0  Change in appetite '2 2 2  2  '$ Feeling bad or failure about yourself  '3 2 2  '$ 0  Trouble concentrating 0 2 2  0  Moving slowly or fidgety/restless '3 2 2  3  '$ Suicidal thoughts 0 2 2  0  PHQ-9 Score '15 16 14  8  '$ Difficult doing work/chores  Not difficult at all   Very difficult       09/01/2022    1:32 PM 05/06/2022    2:58 PM 07/06/2020    3:26 PM 03/28/2020   11:19 PM  GAD 7 : Generalized Anxiety Score  Nervous, Anxious, on Edge 0 0 0 0  Control/stop worrying 0 0 0 0  Worry too much - different things 0 0 0 0  Trouble relaxing 0 0 0 0  Restless 0 0 0 0  Easily annoyed or irritable 0 0 0 0  Afraid - awful might happen 0 0 0 0  Total GAD 7 Score 0 0 0 0  Anxiety Difficulty Not difficult at all Not difficult at all  Not difficult at all    AIMS:  Facial and Oral Movements  Muscles of Facial Expression: Normal Lips and  Perioral Area: None, normal  Jaw: None, normal  Tongue: None, normal Extremity Movements: none Upper (arms, wrists, hands, fingers): None, normal  Lower (legs, knees, ankles, toes): None, normal,  Trunk Movements: none Neck, shoulders, hips: None, normal,  Overall Severity : 0 Severity of abnormal movements (highest score from questions above): None, normal  Incapacitation due to abnormal movements: None, normal  Patient's awareness of abnormal movements (rate only patient's report): No Awareness Dental Status  Current problems with teeth and/or dentures?: No  Does patient usually wear dentures?: No    HYPERTENSION / HYPERLIPIDEMIA Continues on Lisinopril 5 MG daily and Atorvastatin 10 MG daily.  Satisfied with current treatment? yes Duration of hypertension: chronic BP monitoring frequency: not checking BP range:  BP medication side effects: no Duration of hyperlipidemia: chronic Cholesterol medication side effects: no Cholesterol  supplements: none Medication compliance: fair compliance Aspirin: no Recent stressors: no Recurrent headaches: no Visual changes: no Palpitations: no Dyspnea: no Chest pain: no Lower extremity edema: no Dizzy/lightheaded: no   CHRONIC KIDNEY DISEASE Last saw nephrology on 04/10/22.  Was to see October 3rd, but was hurting bad. CKD status: stable Medications renally dose: yes Previous renal evaluation: yes Pneumovax:  Up to Date Influenza Vaccine:  Up to Date   COPD History of smoking, started at 29 and quit 5 years ago. Had initial lung CT screening in 2018 which showed emphysema & aortic atherosclerosis and was to repeat in 12 months, missed screening due to transportation issues.  Has not obtained as instructed. COPD status: stable Satisfied with current treatment?: yes Oxygen use: no Dyspnea frequency: none Cough frequency: none Rescue inhaler frequency:  none Limitation of activity: no Productive cough: none Last Spirometry: unknown --refuses today Pneumovax: Up to Date Influenza: Up to Date   CHRONIC PAIN  Taking Gabapentin 300 MG TID.  Current CrCl 21.  She reports Gabapentin offers her no benefit anymore. Pain control status: uncontrolled Duration: chronic Location: lower back and down legs Quality: dull, aching, and throbbing Current Pain Level: 5/10 Previous Pain Level: 5/10 Breakthrough pain: no Benefit from narcotic medications: no What Activities task can be accomplished with current medication? minimal Previous pain specialty evaluation: yes Non-narcotic analgesic meds: Tylenol Narcotic contract: no   Relevant past medical, surgical, family and social history reviewed and updated as indicated. Interim medical history since our last visit reviewed. Allergies and medications reviewed and updated.  Review of Systems  Constitutional:  Negative for activity change, appetite change, diaphoresis, fatigue and fever.  Respiratory:  Negative for cough, chest  tightness, shortness of breath and wheezing.   Cardiovascular:  Negative for chest pain, palpitations and leg swelling.  Gastrointestinal: Negative.   Musculoskeletal:  Positive for back pain.  Neurological: Negative.   Psychiatric/Behavioral:  Negative for decreased concentration, self-injury, sleep disturbance and suicidal ideas. The patient is not nervous/anxious.    Per HPI unless specifically indicated above    Objective:    BP 106/75   Pulse 89   Temp 99.3 F (37.4 C) (Oral)   Ht 4' 11.49" (1.511 m)   Wt 132 lb 12.8 oz (60.2 kg)   LMP  (LMP Unknown)   SpO2 98%   BMI 26.38 kg/m   Wt Readings from Last 3 Encounters:  09/01/22 132 lb 12.8 oz (60.2 kg)  05/06/22 136 lb 3.2 oz (61.8 kg)  04/01/22 134 lb (60.8 kg)    Physical Exam Vitals and nursing note reviewed.  Constitutional:      General: She is awake. She is  not in acute distress.    Appearance: She is well-developed and well-groomed. She is not ill-appearing.  HENT:     Head: Normocephalic.     Right Ear: Hearing normal.     Left Ear: Hearing normal.     Nose: Nose normal.     Mouth/Throat:     Mouth: Mucous membranes are moist.  Eyes:     General: Lids are normal.        Right eye: No discharge.        Left eye: No discharge.     Conjunctiva/sclera: Conjunctivae normal.     Pupils: Pupils are equal, round, and reactive to light.  Neck:     Thyroid: No thyromegaly.     Vascular: No carotid bruit.  Cardiovascular:     Rate and Rhythm: Normal rate and regular rhythm.     Heart sounds: Normal heart sounds. No murmur heard.    No gallop.  Pulmonary:     Effort: Pulmonary effort is normal. No accessory muscle usage or respiratory distress.     Breath sounds: Normal breath sounds.  Abdominal:     General: Bowel sounds are normal. There is no distension.     Palpations: Abdomen is soft.     Tenderness: There is no abdominal tenderness.     Hernia: No hernia is present.  Musculoskeletal:     Cervical back:  Normal range of motion and neck supple.     Lumbar back: Tenderness present. No swelling, spasms or bony tenderness. Decreased range of motion. Negative right straight leg raise test and negative left straight leg raise test.     Right lower leg: No edema.     Left lower leg: No edema.  Lymphadenopathy:     Head:     Right side of head: No submental, submandibular, tonsillar, preauricular or posterior auricular adenopathy.     Left side of head: No submental, submandibular, tonsillar, preauricular or posterior auricular adenopathy.     Cervical: No cervical adenopathy.  Skin:    General: Skin is warm and dry.     Findings: No bruising or rash.  Neurological:     Mental Status: She is alert and oriented to person, place, and time.  Psychiatric:        Attention and Perception: Attention normal.        Mood and Affect: Mood normal.        Speech: Speech normal.        Behavior: Behavior normal. Behavior is cooperative.    Results for orders placed or performed in visit on 04/01/22  Comprehensive metabolic panel  Result Value Ref Range   Sodium 133 (L) 135 - 145 mmol/L   Potassium 4.4 3.5 - 5.1 mmol/L   Chloride 103 98 - 111 mmol/L   CO2 26 22 - 32 mmol/L   Glucose, Bld 110 (H) 70 - 99 mg/dL   BUN 26 (H) 8 - 23 mg/dL   Creatinine, Ser 1.78 (H) 0.44 - 1.00 mg/dL   Calcium 8.8 (L) 8.9 - 10.3 mg/dL   Total Protein 7.4 6.5 - 8.1 g/dL   Albumin 3.9 3.5 - 5.0 g/dL   AST 15 15 - 41 U/L   ALT 8 0 - 44 U/L   Alkaline Phosphatase 49 38 - 126 U/L   Total Bilirubin 0.7 0.3 - 1.2 mg/dL   GFR, Estimated 30 (L) >60 mL/min   Anion gap 4 (L) 5 - 15  CBC with Differential  Result Value  Ref Range   WBC 5.9 4.0 - 10.5 K/uL   RBC 3.85 (L) 3.87 - 5.11 MIL/uL   Hemoglobin 12.0 12.0 - 15.0 g/dL   HCT 36.9 36.0 - 46.0 %   MCV 95.8 80.0 - 100.0 fL   MCH 31.2 26.0 - 34.0 pg   MCHC 32.5 30.0 - 36.0 g/dL   RDW 13.2 11.5 - 15.5 %   Platelets 68 (L) 150 - 400 K/uL   nRBC 0.0 0.0 - 0.2 %    Neutrophils Relative % 48 %   Neutro Abs 2.9 1.7 - 7.7 K/uL   Lymphocytes Relative 36 %   Lymphs Abs 2.1 0.7 - 4.0 K/uL   Monocytes Relative 10 %   Monocytes Absolute 0.6 0.1 - 1.0 K/uL   Eosinophils Relative 4 %   Eosinophils Absolute 0.2 0.0 - 0.5 K/uL   Basophils Relative 1 %   Basophils Absolute 0.1 0.0 - 0.1 K/uL   Immature Granulocytes 1 %   Abs Immature Granulocytes 0.05 0.00 - 0.07 K/uL      Assessment & Plan:   Problem List Items Addressed This Visit       Cardiovascular and Mediastinum   Aortic atherosclerosis (HCC)    Chronic. Noted on CT imaging 08/07/20.  Continue statin for prevention.  Recommend continued cessation of smoking.  No ASA due to ITP.      Hypertension    Chronic, stable.  BP well below goal today. Continue current medication regimen and adjust as needed.  Recommend she check BP at home at least 3 mornings a week and document.  DASH diet focus.  If ongoing lower reading next visit, consider discontinuation or reduction to 2.5 MG of Lisinopril.  LABS: CMP and CBC.        Relevant Orders   Comprehensive metabolic panel   CBC with Differential/Platelet     Respiratory   Centrilobular emphysema ([place])    Ongoing.  Noted on CT 02/03/2017.  Is a former smoker, reports quitting >5 years ago.  No current inhalers.  Would benefit from spirometry in future, refuses today.  Has repeat lung CT ordered, but has not attended or scheduled this yet -- highly recommend she scheduled this as soon as possible -- she missed scheduled scans.  Will continue to work closely with CCM team on assist.      Relevant Orders   CBC with Differential/Platelet     Nervous and Auditory   Chronic low back pain (Location of Primary Source of Pain) (Bilateral) (R>L) (Chronic)    Chronic, ongoing.  At this time will stop Gabapentin, as offering no benefit, and start renal dosed Lyrica 25 MG BID.  Discussed with her at length and educated on this medication. Continue Tylenol as needed.   May need return to pain clinic in future.      Relevant Medications   pregabalin (LYRICA) 25 MG capsule     Genitourinary   CKD (chronic kidney disease) stage 4, GFR 15-29 ml/min (HCC)    Chronic, ongoing.  Continue current medication regimen and collaboration with nephrology.  CCM collaboration continues.  Obtain CMP and CBC.  Highly recommend she schedule follow-up with nephrology.      Relevant Orders   Comprehensive metabolic panel   CBC with Differential/Platelet     Hematopoietic and Hemostatic   Idiopathic thrombocytopenic purpura (HCC) (Chronic)    Continue to collaborate with hematology, recent note reviewed.  She denies bleeding or increased bruising.  Have highly recommended not to miss  appointments with them due to disease process and ongoing continuity of care.      Relevant Orders   CBC with Differential/Platelet     Other   Chronic pain syndrome (Chronic)    Refer to back pain plan of care.      Relevant Medications   pregabalin (LYRICA) 25 MG capsule   RESOLVED: Depression, major, single episode, mild (HCC)   Elevated prolactin level    Recheck level today, is on Risperdal.      Relevant Orders   Prolactin   VITAMIN D 25 Hydroxy (Vit-D Deficiency, Fractures)   Hematuria    Recheck urine, has refused to see hematology, but will continue to recommend this if ongoing.      Relevant Orders   Urinalysis, Routine w reflex microscopic   Hyperlipidemia    Chronic, ongoing.  Continue current medication regimen and adjust as needed.  Lipid panel today.        Relevant Orders   Comprehensive metabolic panel   Lipid Panel w/o Chol/HDL Ratio   MGUS (monoclonal gammopathy of unknown significance)    Chronic. Continue to collaborate with hematology, recent note reviewed.  She denies bleeding or increased bruising.       Relevant Orders   Serum protein electrophoresis with reflex   Kappa/lambda light chains   Schizophrenia (HCC) - Primary    Chronic,  ongoing.  Continue current medication regimen, Risperdal, and adjust as needed. Has been on this regimen for years with benefit.  Does have underlying paranoia at baseline.  Refills up to date.  Prolactin level today.      Vitamin D insufficiency    History of low levels, recheck today and adjust supplement as needed.      Relevant Orders   VITAMIN D 25 Hydroxy (Vit-D Deficiency, Fractures)   Other Visit Diagnoses     Flu vaccine need       Flu vaccine in office today.   Relevant Orders   Flu Vaccine QUAD High Dose(Fluad) (Completed)        Follow up plan: Return in about 6 weeks (around 10/13/2022) for Back Pain -- Lyrica added.

## 2022-09-01 NOTE — Assessment & Plan Note (Signed)
Chronic. Continue to collaborate with hematology, recent note reviewed.  She denies bleeding or increased bruising.

## 2022-09-01 NOTE — Assessment & Plan Note (Signed)
Recheck urine, has refused to see hematology, but will continue to recommend this if ongoing.

## 2022-09-01 NOTE — Assessment & Plan Note (Signed)
Chronic, ongoing.  Continue current medication regimen, Risperdal, and adjust as needed. Has been on this regimen for years with benefit.  Does have underlying paranoia at baseline.  Refills up to date.  Prolactin level today.

## 2022-09-01 NOTE — Assessment & Plan Note (Signed)
Continue to collaborate with hematology, recent note reviewed.  She denies bleeding or increased bruising.  Have highly recommended not to miss appointments with them due to disease process and ongoing continuity of care.

## 2022-09-01 NOTE — Assessment & Plan Note (Signed)
Refer to back pain plan of care. 

## 2022-09-01 NOTE — Assessment & Plan Note (Signed)
Chronic. Noted on CT imaging 08/07/20.  Continue statin for prevention.  Recommend continued cessation of smoking.  No ASA due to ITP.

## 2022-09-01 NOTE — Assessment & Plan Note (Signed)
Chronic, ongoing.  Continue current medication regimen and collaboration with nephrology.  CCM collaboration continues.  Obtain CMP and CBC.  Highly recommend she schedule follow-up with nephrology.

## 2022-09-01 NOTE — Assessment & Plan Note (Signed)
History of low levels, recheck today and adjust supplement as needed.

## 2022-09-01 NOTE — Assessment & Plan Note (Signed)
Recheck level today, is on Risperdal.

## 2022-09-02 LAB — KAPPA/LAMBDA LIGHT CHAINS
Ig Kappa Free Light Chain: 110.6 mg/L — ABNORMAL HIGH (ref 3.3–19.4)
Ig Lambda Free Light Chain: 54 mg/L — ABNORMAL HIGH (ref 5.7–26.3)
KAPPA/LAMBDA RATIO: 2.05 — ABNORMAL HIGH (ref 0.26–1.65)

## 2022-09-02 NOTE — Progress Notes (Signed)
Good morning, please let Denise Everett know her labs have returned: - Platelets remain low, but improved from previous check.  Waiting on remainder of labs hematology wanted and will send to them once returned. - Kidney function continues to show Stage 4 kidney disease, no worsening.  Please ensure to schedule follow-up with kidney doctor. - Prolactin remains a elevated, however this could be related to kidney disease too and your Risperdal use.  If level gets >300 then I will consider imaging of pituitary.  I do not want to hold or change Risperdal at this time as you have been on for years with benefit. - Cholesterol levels stable as is Vitamin D.  Any questions? Keep being stellar!!  Thank you for allowing me to participate in your care.  I appreciate you. Kindest regards, Yong Wahlquist

## 2022-09-08 LAB — COMPREHENSIVE METABOLIC PANEL
ALT: 6 IU/L (ref 0–32)
AST: 16 IU/L (ref 0–40)
Albumin/Globulin Ratio: 1.4 (ref 1.2–2.2)
Albumin: 4.1 g/dL (ref 3.8–4.8)
Alkaline Phosphatase: 68 IU/L (ref 44–121)
BUN/Creatinine Ratio: 8 — ABNORMAL LOW (ref 12–28)
BUN: 16 mg/dL (ref 8–27)
Bilirubin Total: 0.3 mg/dL (ref 0.0–1.2)
CO2: 20 mmol/L (ref 20–29)
Calcium: 9.3 mg/dL (ref 8.7–10.3)
Chloride: 99 mmol/L (ref 96–106)
Creatinine, Ser: 1.89 mg/dL — ABNORMAL HIGH (ref 0.57–1.00)
Globulin, Total: 2.9 g/dL (ref 1.5–4.5)
Glucose: 110 mg/dL — ABNORMAL HIGH (ref 70–99)
Potassium: 4.5 mmol/L (ref 3.5–5.2)
Sodium: 136 mmol/L (ref 134–144)
Total Protein: 7 g/dL (ref 6.0–8.5)
eGFR: 28 mL/min/{1.73_m2} — ABNORMAL LOW (ref >59)

## 2022-09-08 LAB — IMMUNOFIXATION REFLEX, SERUM
IgA/Immunoglobulin A, Serum: 123 mg/dL (ref 64–422)
IgG (Immunoglobin G), Serum: 1611 mg/dL — ABNORMAL HIGH (ref 586–1602)
IgM (Immunoglobulin M), Srm: 120 mg/dL (ref 26–217)

## 2022-09-08 LAB — LIPID PANEL W/O CHOL/HDL RATIO
Cholesterol, Total: 167 mg/dL (ref 100–199)
HDL: 58 mg/dL (ref >39)
LDL Chol Calc (NIH): 88 mg/dL (ref 0–99)
Triglycerides: 117 mg/dL (ref 0–149)
VLDL Cholesterol Cal: 21 mg/dL (ref 5–40)

## 2022-09-08 LAB — CBC WITH DIFFERENTIAL/PLATELET
Basophils Absolute: 0.1 10*3/uL (ref 0.0–0.2)
Basos: 1 %
EOS (ABSOLUTE): 0.2 10*3/uL (ref 0.0–0.4)
Eos: 2 %
Hematocrit: 35.4 % (ref 34.0–46.6)
Hemoglobin: 11.7 g/dL (ref 11.1–15.9)
Immature Grans (Abs): 0 10*3/uL (ref 0.0–0.1)
Immature Granulocytes: 0 %
Lymphocytes Absolute: 2.3 10*3/uL (ref 0.7–3.1)
Lymphs: 31 %
MCH: 31 pg (ref 26.6–33.0)
MCHC: 33.1 g/dL (ref 31.5–35.7)
MCV: 94 fL (ref 79–97)
Monocytes Absolute: 0.5 10*3/uL (ref 0.1–0.9)
Monocytes: 7 %
Neutrophils Absolute: 4.3 10*3/uL (ref 1.4–7.0)
Neutrophils: 59 %
Platelets: 105 10*3/uL — ABNORMAL LOW (ref 150–450)
RBC: 3.78 x10E6/uL (ref 3.77–5.28)
RDW: 13.7 % (ref 11.7–15.4)
WBC: 7.4 10*3/uL (ref 3.4–10.8)

## 2022-09-08 LAB — PROTEIN ELECTROPHORESIS, SERUM, WITH REFLEX
A/G Ratio: 1.2 (ref 0.7–1.7)
Albumin ELP: 3.8 g/dL (ref 2.9–4.4)
Alpha 1: 0.2 g/dL (ref 0.0–0.4)
Alpha 2: 0.7 g/dL (ref 0.4–1.0)
Beta: 0.9 g/dL (ref 0.7–1.3)
Gamma Globulin: 1.3 g/dL (ref 0.4–1.8)
Globulin, Total: 3.2 g/dL (ref 2.2–3.9)
Interpretation(See Below): 0
M-Spike, %: 0.9 g/dL — ABNORMAL HIGH

## 2022-09-08 LAB — PROLACTIN: Prolactin: 255 ng/mL — ABNORMAL HIGH (ref 4.8–23.3)

## 2022-09-08 LAB — VITAMIN D 25 HYDROXY (VIT D DEFICIENCY, FRACTURES): Vit D, 25-Hydroxy: 40 ng/mL (ref 30.0–100.0)

## 2022-09-23 ENCOUNTER — Other Ambulatory Visit: Payer: Self-pay | Admitting: Nurse Practitioner

## 2022-09-23 NOTE — Telephone Encounter (Signed)
Requested medication (s) are due for refill today:   Provider to review  Requested medication (s) are on the active medication list:   Yes  Future visit scheduled:   Yes   Last ordered: 04/16/2022 #60, 4 refills  Non delegated refill reason returned   Requested Prescriptions  Pending Prescriptions Disp Refills   tiZANidine (ZANAFLEX) 4 MG tablet [Pharmacy Med Name: TIZANIDINE HCL 4 MG TAB] 60 tablet 4    Sig: TAKE 1 TABLET BY MOUTH EVERY 6 HOURS AS NEEDED FOR MUSCLE SPASMS     Not Delegated - Cardiovascular:  Alpha-2 Agonists - tizanidine Failed - 09/23/2022  3:10 PM      Failed - This refill cannot be delegated      Passed - Valid encounter within last 6 months    Recent Outpatient Visits           3 weeks ago Schizophrenia, unspecified type ([place])   [place], Jolene T, NP   4 months ago Chronic pain syndrome   [facility] [name], NP   6 months ago Centrilobular emphysema ([place])   [place], Henrine Screws T, NP   1 year ago Schizophrenia, unspecified type ([place])   [place], Barbaraann Faster, NP   1 year ago Centrilobular emphysema ([place])   [place], Barbaraann Faster, NP       Future Appointments             In 2 weeks Cannady, Barbaraann Faster, NP [facility]

## 2022-09-23 NOTE — Telephone Encounter (Signed)
Requested Prescriptions  Pending Prescriptions Disp Refills   lisinopril (ZESTRIL) 5 MG tablet [Pharmacy Med Name: LISINOPRIL 5 MG TAB] 90 tablet 0    Sig: TAKE 1 TABLET BY MOUTH DAILY     Cardiovascular:  ACE Inhibitors Failed - 09/23/2022  3:11 PM      Failed - Cr in normal range and within 180 days    Creatinine  Date Value Ref Range Status  06/23/2014 1.57 (H) 0.60 - 1.30 mg/dL Final   Creatinine, Ser  Date Value Ref Range Status  09/01/2022 1.89 (H) 0.57 - 1.00 mg/dL Final         Passed - K in normal range and within 180 days    Potassium  Date Value Ref Range Status  09/01/2022 4.5 3.5 - 5.2 mmol/L Final  06/23/2014 5.0 3.5 - 5.1 mmol/L Final         Passed - Patient is not pregnant      Passed - Last BP in normal range    BP Readings from Last 1 Encounters:  09/01/22 106/75         Passed - Valid encounter within last 6 months    Recent Outpatient Visits           3 weeks ago Schizophrenia, unspecified type ([place])   [place], Lynwood T, NP   4 months ago Chronic pain syndrome   [facility] [name], NP   6 months ago Centrilobular emphysema ([place])   [place], Henrine Screws T, NP   1 year ago Schizophrenia, unspecified type ([place])   [place], Barbaraann Faster, NP   1 year ago Centrilobular emphysema ([place])   [place], Barbaraann Faster, NP       Future Appointments             In 2 weeks Cannady, Barbaraann Faster, NP [facility]

## 2022-09-25 ENCOUNTER — Telehealth: Payer: Self-pay | Admitting: Nurse Practitioner

## 2022-09-25 NOTE — Telephone Encounter (Signed)
Copied from [place] [id number]. Topic: General - Other >> Sep 25, 2022 11:08 AM Chapman Fitch wrote: Reason for CRM: Pt is having difficulty getting in contact with [facility] / she was advised to call office if this happens / pt would like office to contact provider so she can schedule an appt / please advise

## 2022-09-25 NOTE — Telephone Encounter (Signed)
Left message for patient to return call regarding her appt at [facility].   Spoke with Building services engineer at [employer]. in [place] on behalf of patient Denise Everett. She was given an appt for 12/27 at 1:15pm. Patient will need to be sure to make this appt as it is very important that she does so.

## 2022-09-25 NOTE — Telephone Encounter (Signed)
Patient made aware of results and verbalized understanding.  

## 2022-10-12 NOTE — Patient Instructions (Signed)
Chronic Back Pain When back pain lasts longer than 3 months, it is called chronic back pain. Pain may get worse at certain times (flare-ups). There are things you can do at home to manage your pain. Follow these instructions at home: Pay attention to any changes in your symptoms. Take these actions to help with your pain: Managing pain and stiffness     If told, put ice on the painful area. Your doctor may tell you to use ice for 24-48 hours after the flare-up starts. To do this: Put ice in a plastic bag. Place a towel between your skin and the bag. Leave the ice on for 20 minutes, 2-3 times a day. If told, put heat on the painful area. Do this as often as told by your doctor. Use the heat source that your doctor recommends, such as a moist heat pack or a heating pad. Place a towel between your skin and the heat source. Leave the heat on for 20-30 minutes. Take off the heat if your skin turns bright red. This is especially important if you are unable to feel pain, heat, or cold. You may have a greater risk of getting burned. Soak in a warm bath. This can help relieve pain. Activity  Avoid bending and other activities that make pain worse. When standing: Keep your upper back and neck straight. Keep your shoulders pulled back. Avoid slouching. When sitting: Keep your back straight. Relax your shoulders. Do not round your shoulders or pull them backward. Do not sit or stand in one place for long periods of time. Take short rest breaks during the day. Lying down or standing is usually better than sitting. Resting can help relieve pain. When sitting or lying down for a long time, do some mild activity or stretching. This will help to prevent stiffness and pain. Get regular exercise. Ask your doctor what activities are safe for you. Do not lift anything that is heavier than 10 lb (4.5 kg) or the limit that you are told, until your doctor says that it is safe. To prevent injury when you lift  things: Bend your knees. Keep the weight close to your body. Avoid twisting. Sleep on a firm mattress. Try lying on your side with your knees slightly bent. If you lie on your back, put a pillow under your knees. Medicines Treatment may include medicines for pain and swelling taken by mouth or put on the skin, prescription pain medicine, or muscle relaxants. Take over-the-counter and prescription medicines only as told by your doctor. Ask your doctor if the medicine prescribed to you: Requires you to avoid driving or using machinery. Can cause trouble pooping (constipation). You may need to take these actions to prevent or treat trouble pooping: Drink enough fluid to keep your pee (urine) pale yellow. Take over-the-counter or prescription medicines. Eat foods that are high in fiber. These include beans, whole grains, and fresh fruits and vegetables. Limit foods that are high in fat and sugars. These include fried or sweet foods. General instructions Do not use any products that contain nicotine or tobacco, such as cigarettes, e-cigarettes, and chewing tobacco. If you need help quitting, ask your doctor. Keep all follow-up visits as told by your doctor. This is important. Contact a doctor if: Your pain does not get better with rest or medicine. Your pain gets worse, or you have new pain. You have a high fever. You lose weight very quickly. You have trouble doing your normal activities. Get help right away   if: One or both of your legs or feet feel weak. One or both of your legs or feet lose feeling (have numbness). You have trouble controlling when you poop (have a bowel movement) or pee (urinate). You have bad back pain and: You feel like you may vomit (nauseous), or you vomit. You have pain in your belly (abdomen). You have shortness of breath. You faint. Summary When back pain lasts longer than 3 months, it is called chronic back pain. Pain may get worse at certain times  (flare-ups). Use ice and heat as told by your doctor. Your doctor may tell you to use ice after flare-ups. This information is not intended to replace advice given to you by your health care provider. Make sure you discuss any questions you have with your health care provider. Document Revised: 12/07/2019 Document Reviewed: 12/07/2019 Elsevier Patient Education  2023 Elsevier Inc.  

## 2022-10-13 ENCOUNTER — Encounter: Payer: Self-pay | Admitting: Nurse Practitioner

## 2022-10-13 ENCOUNTER — Ambulatory Visit (INDEPENDENT_AMBULATORY_CARE_PROVIDER_SITE_OTHER): Payer: Medicare Other | Admitting: Nurse Practitioner

## 2022-10-13 VITALS — BP 122/81 | HR 60 | Temp 97.6°F | Ht 59.49 in | Wt 138.6 lb

## 2022-10-13 DIAGNOSIS — M5442 Lumbago with sciatica, left side: Secondary | ICD-10-CM

## 2022-10-13 DIAGNOSIS — G894 Chronic pain syndrome: Secondary | ICD-10-CM

## 2022-10-13 DIAGNOSIS — G8929 Other chronic pain: Secondary | ICD-10-CM

## 2022-10-13 DIAGNOSIS — M5441 Lumbago with sciatica, right side: Secondary | ICD-10-CM

## 2022-10-13 NOTE — Assessment & Plan Note (Signed)
Chronic, improving.  Currently tolerating and noticing improvement with renal dosed Lyrica, will continue this and adjust as needed -- monitor with CKD.  Discussed with her at length and educated on this medication. Continue Tylenol as needed.  May need return to pain clinic in future.

## 2022-10-13 NOTE — Assessment & Plan Note (Signed)
Refer to back pain plan of care.

## 2022-10-13 NOTE — Progress Notes (Signed)
BP 122/81   Pulse 60   Temp 97.6 F (36.4 C) (Oral)   Ht 4' 11.49" (1.511 m)   Wt 138 lb 9.6 oz (62.9 kg)   LMP  (LMP Unknown)   SpO2 99%   BMI 27.54 kg/m    Subjective:    Patient ID: Denise Everett, female    DOB: October 23, 1951, 71 y.o.   MRN: [id number]  HPI: Denise Everett is a 71 y.o. female  Chief Complaint  Patient presents with   Back Pain    Following up after adding Lyrica, patient states pain is much better   BACK PAIN Started on Lyrica 25 MG BID at last visit and reports this is "really helping" back pain.  No side effects with this.   Duration:  chronic Mechanism of injury: unknown Location: Left and low back Onset: gradual Severity: 5/10 improved Quality: dull and aching Frequency: intermittent Radiation: L leg below the knee Aggravating factors: lifting, movement, walking, and bending -- doing housework Alleviating factors: Lyrica Status: stable Treatments attempted: Lyrica, Gabapentin, Tylenol, Ibuprofen, heating pad, ice  Relief with NSAIDs?: No NSAIDs Taken Nighttime pain:  no Paresthesias / decreased sensation:  no Bowel / bladder incontinence:  no Fevers:  no Dysuria / urinary frequency:  no   Relevant past medical, surgical, family and social history reviewed and updated as indicated. Interim medical history since our last visit reviewed. Allergies and medications reviewed and updated.  Review of Systems  Constitutional:  Negative for activity change, appetite change, diaphoresis, fatigue and fever.  Respiratory:  Negative for cough, chest tightness, shortness of breath and wheezing.   Cardiovascular:  Negative for chest pain, palpitations and leg swelling.  Gastrointestinal: Negative.   Musculoskeletal:  Positive for back pain.  Neurological: Negative.   Psychiatric/Behavioral:  Negative for decreased concentration, self-injury, sleep disturbance and suicidal ideas. The patient is not nervous/anxious.     Per HPI unless specifically  indicated above     Objective:    BP 122/81   Pulse 60   Temp 97.6 F (36.4 C) (Oral)   Ht 4' 11.49" (1.511 m)   Wt 138 lb 9.6 oz (62.9 kg)   LMP  (LMP Unknown)   SpO2 99%   BMI 27.54 kg/m   Wt Readings from Last 3 Encounters:  10/13/22 138 lb 9.6 oz (62.9 kg)  09/01/22 132 lb 12.8 oz (60.2 kg)  05/06/22 136 lb 3.2 oz (61.8 kg)    Physical Exam Vitals and nursing note reviewed.  Constitutional:      General: She is awake. She is not in acute distress.    Appearance: She is well-developed and well-groomed. She is not ill-appearing.  HENT:     Head: Normocephalic.     Right Ear: Hearing normal.     Left Ear: Hearing normal.     Nose: Nose normal.     Mouth/Throat:     Mouth: Mucous membranes are moist.  Eyes:     General: Lids are normal.        Right eye: No discharge.        Left eye: No discharge.     Conjunctiva/sclera: Conjunctivae normal.     Pupils: Pupils are equal, round, and reactive to light.  Neck:     Thyroid: No thyromegaly.     Vascular: No carotid bruit.  Cardiovascular:     Rate and Rhythm: Normal rate and regular rhythm.     Heart sounds: Normal heart sounds. No murmur heard.  No gallop.  Pulmonary:     Effort: Pulmonary effort is normal. No accessory muscle usage or respiratory distress.     Breath sounds: Normal breath sounds.  Abdominal:     General: Bowel sounds are normal. There is no distension.     Palpations: Abdomen is soft.     Tenderness: There is no abdominal tenderness.     Hernia: No hernia is present.  Musculoskeletal:     Cervical back: Normal range of motion and neck supple.     Lumbar back: No swelling, spasms, tenderness or bony tenderness. Normal range of motion. Negative right straight leg raise test and negative left straight leg raise test.     Right lower leg: No edema.     Left lower leg: No edema.  Lymphadenopathy:     Head:     Right side of head: No submental, submandibular, tonsillar, preauricular or posterior  auricular adenopathy.     Left side of head: No submental, submandibular, tonsillar, preauricular or posterior auricular adenopathy.     Cervical: No cervical adenopathy.  Skin:    General: Skin is warm and dry.     Findings: No bruising or rash.  Neurological:     Mental Status: She is alert and oriented to person, place, and time.  Psychiatric:        Attention and Perception: Attention normal.        Mood and Affect: Mood normal.        Speech: Speech normal.        Behavior: Behavior normal. Behavior is cooperative.     Results for orders placed or performed in visit on 09/01/22  Microscopic Examination   Urine  Result Value Ref Range   WBC, UA 0-5 0 - 5 /hpf   RBC, Urine 0-2 0 - 2 /hpf   Epithelial Cells (non renal) 0-10 0 - 10 /hpf   Bacteria, UA Few None seen/Few  Comprehensive metabolic panel  Result Value Ref Range   Glucose 110 (H) 70 - 99 mg/dL   BUN 16 8 - 27 mg/dL   Creatinine, Ser 1.89 (H) 0.57 - 1.00 mg/dL   eGFR 28 (L) >59 mL/min/1.73   BUN/Creatinine Ratio 8 (L) 12 - 28   Sodium 136 134 - 144 mmol/L   Potassium 4.5 3.5 - 5.2 mmol/L   Chloride 99 96 - 106 mmol/L   CO2 20 20 - 29 mmol/L   Calcium 9.3 8.7 - 10.3 mg/dL   Total Protein 7.0 6.0 - 8.5 g/dL   Albumin 4.1 3.8 - 4.8 g/dL   Globulin, Total 2.9 1.5 - 4.5 g/dL   Albumin/Globulin Ratio 1.4 1.2 - 2.2   Bilirubin Total 0.3 0.0 - 1.2 mg/dL   Alkaline Phosphatase 68 44 - 121 IU/L   AST 16 0 - 40 IU/L   ALT 6 0 - 32 IU/L  CBC with Differential/Platelet  Result Value Ref Range   WBC 7.4 3.4 - 10.8 x10E3/uL   RBC 3.78 3.77 - 5.28 x10E6/uL   Hemoglobin 11.7 11.1 - 15.9 g/dL   Hematocrit 35.4 34.0 - 46.6 %   MCV 94 79 - 97 fL   MCH 31.0 26.6 - 33.0 pg   MCHC 33.1 31.5 - 35.7 g/dL   RDW 13.7 11.7 - 15.4 %   Platelets 105 (L) 150 - 450 x10E3/uL   Neutrophils 59 Not Estab. %   Lymphs 31 Not Estab. %   Monocytes 7 Not Estab. %   Eos 2 Not  Estab. %   Basos 1 Not Estab. %   Neutrophils Absolute 4.3 1.4  - 7.0 x10E3/uL   Lymphocytes Absolute 2.3 0.7 - 3.1 x10E3/uL   Monocytes Absolute 0.5 0.1 - 0.9 x10E3/uL   EOS (ABSOLUTE) 0.2 0.0 - 0.4 x10E3/uL   Basophils Absolute 0.1 0.0 - 0.2 x10E3/uL   Immature Granulocytes 0 Not Estab. %   Immature Grans (Abs) 0.0 0.0 - 0.1 x10E3/uL  Lipid Panel w/o Chol/HDL Ratio  Result Value Ref Range   Cholesterol, Total 167 100 - 199 mg/dL   Triglycerides 117 0 - 149 mg/dL   HDL 58 >39 mg/dL   VLDL Cholesterol Cal 21 5 - 40 mg/dL   LDL Chol Calc (NIH) 88 0 - 99 mg/dL  Prolactin  Result Value Ref Range   Prolactin 255.0 (H) 4.8 - 23.3 ng/mL  VITAMIN D 25 Hydroxy (Vit-D Deficiency, Fractures)  Result Value Ref Range   Vit D, 25-Hydroxy 40.0 30.0 - 100.0 ng/mL  Serum protein electrophoresis with reflex  Result Value Ref Range   Albumin ELP 3.8 2.9 - 4.4 g/dL   Alpha 1 0.2 0.0 - 0.4 g/dL   Alpha 2 0.7 0.4 - 1.0 g/dL   Beta 0.9 0.7 - 1.3 g/dL   Gamma Globulin 1.3 0.4 - 1.8 g/dL   M-Spike, % 0.9 (H) Not Observed g/dL   Globulin, Total 3.2 2.2 - 3.9 g/dL   A/G Ratio 1.2 0.7 - 1.7   Please Note: Comment    Interpretation(See Below) .   Kappa/lambda light chains  Result Value Ref Range   Ig Kappa Free Light Chain 110.6 (H) 3.3 - 19.4 mg/L   Ig Lambda Free Light Chain 54.0 (H) 5.7 - 26.3 mg/L   KAPPA/LAMBDA RATIO 2.05 (H) 0.26 - 1.65  Urinalysis, Routine w reflex microscopic  Result Value Ref Range   Specific Gravity, UA 1.010 1.005 - 1.030   pH, UA 5.5 5.0 - 7.5   Color, UA Yellow Yellow   Appearance Ur Clear Clear   Leukocytes,UA Negative Negative   Protein,UA Negative Negative/Trace   Glucose, UA Negative Negative   Ketones, UA Negative Negative   RBC, UA 2+ (A) Negative   Bilirubin, UA Negative Negative   Urobilinogen, Ur 0.2 0.2 - 1.0 mg/dL   Nitrite, UA Negative Negative   Microscopic Examination See below:   Immunofixation Reflex, Serum  Result Value Ref Range   IgG (Immunoglobin G), Serum 1,611 (H) 586 - 1,602 mg/dL    IgA/Immunoglobulin A, Serum 123 64 - 422 mg/dL   IgM (Immunoglobulin M), Srm 120 26 - 217 mg/dL   IFE 1 Comment (A)       Assessment & Plan:   Problem List Items Addressed This Visit       Nervous and Auditory   Chronic low back pain (Location of Primary Source of Pain) (Bilateral) (R>L) - Primary (Chronic)    Chronic, improving.  Currently tolerating and noticing improvement with renal dosed Lyrica, will continue this and adjust as needed -- monitor with CKD.  Discussed with her at length and educated on this medication. Continue Tylenol as needed.  May need return to pain clinic in future.        Other   Chronic pain syndrome (Chronic)    Refer to back pain plan of care.        Follow up plan: Return in about 5 months (around 03/14/2023) for HTN/HLD, CKD, MOOD, CHRONIC PAIN, COPD, VIT D.

## 2022-11-19 NOTE — Patient Instructions (Signed)

## 2022-11-21 ENCOUNTER — Encounter: Payer: Self-pay | Admitting: Nurse Practitioner

## 2022-11-21 ENCOUNTER — Ambulatory Visit (INDEPENDENT_AMBULATORY_CARE_PROVIDER_SITE_OTHER): Payer: 59 | Admitting: Nurse Practitioner

## 2022-11-21 VITALS — BP 138/84 | HR 82 | Temp 98.3°F | Ht 59.49 in | Wt 132.3 lb

## 2022-11-21 DIAGNOSIS — R052 Subacute cough: Secondary | ICD-10-CM | POA: Insufficient documentation

## 2022-11-21 MED ORDER — PREDNISONE 20 MG PO TABS: 40.0000 mg | ORAL_TABLET | Freq: Every day | ORAL | 0 refills | Status: AC

## 2022-11-21 MED ORDER — AMOXICILLIN-POT CLAVULANATE 875-125 MG PO TABS: 1.0000 | ORAL_TABLET | Freq: Two times a day (BID) | ORAL | 0 refills | Status: AC

## 2022-11-21 MED ORDER — BENZONATATE 100 MG PO CAPS: 100.0000 mg | ORAL_CAPSULE | Freq: Three times a day (TID) | ORAL | 0 refills | Status: DC | PRN

## 2022-11-21 MED ORDER — ALBUTEROL SULFATE HFA 108 (90 BASE) MCG/ACT IN AERS: 2.0000 | INHALATION_SPRAY | Freq: Four times a day (QID) | RESPIRATORY_TRACT | 4 refills | Status: AC | PRN

## 2022-11-21 NOTE — Progress Notes (Signed)
BP 138/84   Pulse 82   Temp 98.3 F (36.8 C) (Oral)   Ht 4' 11.49" (1.511 m)   Wt 132 lb 4.8 oz (60 kg)   LMP  (LMP Unknown)   SpO2 99%   BMI 26.28 kg/m    Subjective:    Patient ID: Denise Everett, female    DOB: [birth year], 72 y.o.   MRN: [id number]  HPI: Denise Everett is a 72 y.o. female  Chief Complaint  Patient presents with   Cough    Since before Christmas   COUGH Started with cough before Christmas and nasal congestion, this has been lingering and ongoing since then.  Did not do Covid testing when first started.  No fevers present.  Has underlying COPD.   Duration: weeks Circumstances of initial development of cough: URI Cough severity: moderate Cough description: productive Aggravating factors:  worse in the AM Alleviating factors: mucinex Status:  fluctuating Treatments attempted: Tylenol and Mucinex Wheezing: no Shortness of breath: no Chest pain: no Chest tightness:no Nasal congestion: yes Runny nose: yes Postnasal drip: yes Frequent throat clearing or swallowing: yes Hemoptysis: no Fevers: no Night sweats: no Weight loss: no Heartburn: no Recent foreign travel: no Tuberculosis contacts: no   Relevant past medical, surgical, family and social history reviewed and updated as indicated. Interim medical history since our last visit reviewed. Allergies and medications reviewed and updated.  Review of Systems  Constitutional:  Positive for fatigue. Negative for activity change, appetite change, chills and fever.  HENT:  Positive for congestion, postnasal drip and rhinorrhea. Negative for ear discharge, ear pain, facial swelling, sinus pressure, sinus pain, sneezing, sore throat and voice change.   Eyes:  Negative for pain and visual disturbance.  Respiratory:  Positive for cough and chest tightness. Negative for shortness of breath and wheezing.   Cardiovascular:  Negative for chest pain, palpitations and leg swelling.  Gastrointestinal: Negative.    Neurological:  Negative for dizziness, numbness and headaches.  Psychiatric/Behavioral: Negative.     Per HPI unless specifically indicated above     Objective:    BP 138/84   Pulse 82   Temp 98.3 F (36.8 C) (Oral)   Ht 4' 11.49" (1.511 m)   Wt 132 lb 4.8 oz (60 kg)   LMP  (LMP Unknown)   SpO2 99%   BMI 26.28 kg/m   Wt Readings from Last 3 Encounters:  11/21/22 132 lb 4.8 oz (60 kg)  10/13/22 138 lb 9.6 oz (62.9 kg)  09/01/22 132 lb 12.8 oz (60.2 kg)    Physical Exam Vitals and nursing note reviewed.  Constitutional:      General: She is awake. She is not in acute distress.    Appearance: She is well-developed and well-groomed. She is not ill-appearing or toxic-appearing.  HENT:     Head: Normocephalic.     Right Ear: Hearing, ear canal and external ear normal. A middle ear effusion is present. Tympanic membrane is not injected or perforated.     Left Ear: Hearing, ear canal and external ear normal. A middle ear effusion is present. Tympanic membrane is not injected or perforated.     Nose: Rhinorrhea present. Rhinorrhea is clear.     Right Sinus: No maxillary sinus tenderness or frontal sinus tenderness.     Left Sinus: No maxillary sinus tenderness or frontal sinus tenderness.     Mouth/Throat:     Mouth: Mucous membranes are moist.     Pharynx: Posterior oropharyngeal erythema (  mild) present. No pharyngeal swelling or oropharyngeal exudate.  Eyes:     General: Lids are normal.        Right eye: No discharge.        Left eye: No discharge.     Conjunctiva/sclera: Conjunctivae normal.     Pupils: Pupils are equal, round, and reactive to light.  Neck:     Thyroid: No thyromegaly.     Vascular: No carotid bruit.  Cardiovascular:     Rate and Rhythm: Normal rate and regular rhythm.     Heart sounds: Normal heart sounds. No murmur heard.    No gallop.  Pulmonary:     Effort: Pulmonary effort is normal. No accessory muscle usage or respiratory distress.     Breath  sounds: Wheezing present. No decreased breath sounds or rhonchi.     Comments: Mild expiratory wheezes noted throughout. Abdominal:     General: Bowel sounds are normal.     Palpations: Abdomen is soft. There is no hepatomegaly or splenomegaly.  Musculoskeletal:     Cervical back: Normal range of motion and neck supple.     Right lower leg: No edema.     Left lower leg: No edema.  Lymphadenopathy:     Head:     Right side of head: No submental, submandibular, tonsillar, preauricular or posterior auricular adenopathy.     Left side of head: No submental, submandibular, tonsillar, preauricular or posterior auricular adenopathy.     Cervical: No cervical adenopathy.  Skin:    General: Skin is warm and dry.  Neurological:     Mental Status: She is alert and oriented to person, place, and time.  Psychiatric:        Attention and Perception: Attention normal.        Mood and Affect: Mood normal.        Speech: Speech normal.        Behavior: Behavior normal. Behavior is cooperative.        Thought Content: Thought content normal.    Results for orders placed or performed in visit on 09/01/22  Microscopic Examination   Urine  Result Value Ref Range   WBC, UA 0-5 0 - 5 /hpf   RBC, Urine 0-2 0 - 2 /hpf   Epithelial Cells (non renal) 0-10 0 - 10 /hpf   Bacteria, UA Few None seen/Few  Comprehensive metabolic panel  Result Value Ref Range   Glucose 110 (H) 70 - 99 mg/dL   BUN 16 8 - 27 mg/dL   Creatinine, Ser 1.89 (H) 0.57 - 1.00 mg/dL   eGFR 28 (L) >59 mL/min/1.73   BUN/Creatinine Ratio 8 (L) 12 - 28   Sodium 136 134 - 144 mmol/L   Potassium 4.5 3.5 - 5.2 mmol/L   Chloride 99 96 - 106 mmol/L   CO2 20 20 - 29 mmol/L   Calcium 9.3 8.7 - 10.3 mg/dL   Total Protein 7.0 6.0 - 8.5 g/dL   Albumin 4.1 3.8 - 4.8 g/dL   Globulin, Total 2.9 1.5 - 4.5 g/dL   Albumin/Globulin Ratio 1.4 1.2 - 2.2   Bilirubin Total 0.3 0.0 - 1.2 mg/dL   Alkaline Phosphatase 68 44 - 121 IU/L   AST 16 0 - 40  IU/L   ALT 6 0 - 32 IU/L  CBC with Differential/Platelet  Result Value Ref Range   WBC 7.4 3.4 - 10.8 x10E3/uL   RBC 3.78 3.77 - 5.28 x10E6/uL   Hemoglobin 11.7 11.1 - 15.9  g/dL   Hematocrit 35.4 34.0 - 46.6 %   MCV 94 79 - 97 fL   MCH 31.0 26.6 - 33.0 pg   MCHC 33.1 31.5 - 35.7 g/dL   RDW 13.7 11.7 - 15.4 %   Platelets 105 (L) 150 - 450 x10E3/uL   Neutrophils 59 Not Estab. %   Lymphs 31 Not Estab. %   Monocytes 7 Not Estab. %   Eos 2 Not Estab. %   Basos 1 Not Estab. %   Neutrophils Absolute 4.3 1.4 - 7.0 x10E3/uL   Lymphocytes Absolute 2.3 0.7 - 3.1 x10E3/uL   Monocytes Absolute 0.5 0.1 - 0.9 x10E3/uL   EOS (ABSOLUTE) 0.2 0.0 - 0.4 x10E3/uL   Basophils Absolute 0.1 0.0 - 0.2 x10E3/uL   Immature Granulocytes 0 Not Estab. %   Immature Grans (Abs) 0.0 0.0 - 0.1 x10E3/uL  Lipid Panel w/o Chol/HDL Ratio  Result Value Ref Range   Cholesterol, Total 167 100 - 199 mg/dL   Triglycerides 117 0 - 149 mg/dL   HDL 58 >39 mg/dL   VLDL Cholesterol Cal 21 5 - 40 mg/dL   LDL Chol Calc (NIH) 88 0 - 99 mg/dL  Prolactin  Result Value Ref Range   Prolactin 255.0 (H) 4.8 - 23.3 ng/mL  VITAMIN D 25 Hydroxy (Vit-D Deficiency, Fractures)  Result Value Ref Range   Vit D, 25-Hydroxy 40.0 30.0 - 100.0 ng/mL  Serum protein electrophoresis with reflex  Result Value Ref Range   Albumin ELP 3.8 2.9 - 4.4 g/dL   Alpha 1 0.2 0.0 - 0.4 g/dL   Alpha 2 0.7 0.4 - 1.0 g/dL   Beta 0.9 0.7 - 1.3 g/dL   Gamma Globulin 1.3 0.4 - 1.8 g/dL   M-Spike, % 0.9 (H) Not Observed g/dL   Globulin, Total 3.2 2.2 - 3.9 g/dL   A/G Ratio 1.2 0.7 - 1.7   Please Note: Comment    Interpretation(See Below) .   Kappa/lambda light chains  Result Value Ref Range   Ig Kappa Free Light Chain 110.6 (H) 3.3 - 19.4 mg/L   Ig Lambda Free Light Chain 54.0 (H) 5.7 - 26.3 mg/L   KAPPA/LAMBDA RATIO 2.05 (H) 0.26 - 1.65  Urinalysis, Routine w reflex microscopic  Result Value Ref Range   Specific Gravity, UA 1.010 1.005 - 1.030    pH, UA 5.5 5.0 - 7.5   Color, UA Yellow Yellow   Appearance Ur Clear Clear   Leukocytes,UA Negative Negative   Protein,UA Negative Negative/Trace   Glucose, UA Negative Negative   Ketones, UA Negative Negative   RBC, UA 2+ (A) Negative   Bilirubin, UA Negative Negative   Urobilinogen, Ur 0.2 0.2 - 1.0 mg/dL   Nitrite, UA Negative Negative   Microscopic Examination See below:   Immunofixation Reflex, Serum  Result Value Ref Range   IgG (Immunoglobin G), Serum 1,611 (H) 586 - 1,602 mg/dL   IgA/Immunoglobulin A, Serum 123 64 - 422 mg/dL   IgM (Immunoglobulin M), Srm 120 26 - 217 mg/dL   IFE 1 Comment (A)       Assessment & Plan:   Problem List Items Addressed This Visit       Other   Subacute cough - Primary    Ongoing for several weeks since before Christmas.  Will not test for Covid or Flu, past treatment dates for either.  Due to underlying COPD and ongoing cough, will start Prednisone 40 MG daily for 5 days, Augmentin BID for 7 days,  Tessalon as needed, and Albuterol inhaler as needed.  Recommend: - Increased rest - Increasing Fluids - Acetaminophen as needed for fever/pain.  - Salt water gargling, chloraseptic spray and throat lozenges - Mucinex.  - Humidifying the air.  Return if worsening or ongoing -- if ongoing will obtain imaging.        Follow up plan: Return if symptoms worsen or fail to improve.

## 2022-11-21 NOTE — Assessment & Plan Note (Addendum)
Ongoing for several weeks since before Christmas.  Will not test for Covid or Flu, past treatment dates for either.  Due to underlying COPD and ongoing cough, will start Prednisone 40 MG daily for 5 days, Augmentin BID for 7 days, Tessalon as needed, and Albuterol inhaler as needed.  Recommend: - Increased rest - Increasing Fluids - Acetaminophen as needed for fever/pain.  - Salt water gargling, chloraseptic spray and throat lozenges - Mucinex.  - Humidifying the air.  Return if worsening or ongoing -- if ongoing will obtain imaging.

## 2022-12-01 ENCOUNTER — Other Ambulatory Visit: Payer: Self-pay | Admitting: Nurse Practitioner

## 2022-12-02 NOTE — Telephone Encounter (Signed)
Requested medication (s) are due for refill today: yes  Requested medication (s) are on the active medication list: yes  Last refill:  raloxifene: 06/06/22 #90 1 RF             pregabalin: 09/01/22 #180  Future visit scheduled: yes  Notes to clinic:  raloxifene: needs Dexa scan and Pregabalin not delegated to NT to reorder   Requested Prescriptions  Pending Prescriptions Disp Refills   raloxifene (EVISTA) 60 MG tablet [Pharmacy Med Name: RALOXIFENE HCL 60 MG TAB] 90 tablet 1    Sig: TAKE 1 TABLET BY MOUTH DAILY     OB/GYN: Selective Estrogen Receptor Modulators 2 Failed - 12/02/2022 10:47 AM      Failed - Bone Mineral Density or Dexa Scan completed in the last 2 years      41 - Ca in normal range and within 360 days    Calcium  Date Value Ref Range Status  09/01/2022 9.3 8.7 - 10.3 mg/dL Final   Calcium, Total  Date Value Ref Range Status  06/23/2014 8.1 (L) 8.5 - 10.1 mg/dL Final         Passed - Valid encounter within last 12 months    Recent Outpatient Visits           1 week ago Subacute cough   [place], [place] T, NP   1 month ago Chronic low back pain (Location of Primary Source of Pain) (Bilateral) (R>L)   [place], Henrine Screws T, NP   3 months ago Schizophrenia, unspecified type ([place])   [place], Henrine Screws T, NP   7 months ago Chronic pain syndrome   [place] [name], NP   9 months ago Centrilobular emphysema ([place])   [place], Barbaraann Faster, NP       Future Appointments             In 3 months Cannady, Barbaraann Faster, NP [place], [facility]             pregabalin (LYRICA) 25 MG capsule [Pharmacy Med Name: PREGABALIN 25 MG CAP] 180 capsule     Sig: TAKE ONE CAPSULE BY MOUTH TWICE A DAY     Not Delegated - Neurology:  Anticonvulsants - Controlled - pregabalin Failed -  12/02/2022 10:47 AM      Failed - This refill cannot be delegated      Failed - Cr in normal range and within 360 days    Creatinine  Date Value Ref Range Status  06/23/2014 1.57 (H) 0.60 - 1.30 mg/dL Final   Creatinine, Ser  Date Value Ref Range Status  09/01/2022 1.89 (H) 0.57 - 1.00 mg/dL Final         Passed - Completed PHQ-2 or PHQ-9 in the last 360 days      Passed - Valid encounter within last 12 months    Recent Outpatient Visits           1 week ago Subacute cough   [place], [place] T, NP   1 month ago Chronic low back pain (Location of Primary Source of Pain) (Bilateral) (R>L)   [place], Henrine Screws T, NP   3 months ago Schizophrenia, unspecified type [facility])   [place], [place] T, NP   7 months ago Chronic pain syndrome  [place] [name], NP   9 months ago Centrilobular emphysema [facility])   [place], Barbaraann Faster, NP       Future Appointments             In 3 months Cannady, Barbaraann Faster, NP [place], [facility]

## 2022-12-02 NOTE — Telephone Encounter (Signed)
Requested Prescriptions  Pending Prescriptions Disp Refills   raloxifene (EVISTA) 60 MG tablet [Pharmacy Med Name: RALOXIFENE HCL 60 MG TAB] 90 tablet 0    Sig: TAKE 1 TABLET BY MOUTH DAILY     OB/GYN: Selective Estrogen Receptor Modulators 2 Failed - 12/02/2022 10:47 AM      Failed - Bone Mineral Density or Dexa Scan completed in the last 2 years      Passed - Ca in normal range and within 360 days    Calcium  Date Value Ref Range Status  09/01/2022 9.3 8.7 - 10.3 mg/dL Final   Calcium, Total  Date Value Ref Range Status  06/23/2014 8.1 (L) 8.5 - 10.1 mg/dL Final         Passed - Valid encounter within last 12 months    Recent Outpatient Visits           1 week ago Subacute cough   [place], Henrine Screws T, NP   1 month ago Chronic low back pain (Location of Primary Source of Pain) (Bilateral) (R>L)   [place], Henrine Screws T, NP   3 months ago Schizophrenia, unspecified type ([place])   [place], Henrine Screws T, NP   7 months ago Chronic pain syndrome   [place] [name], NP   9 months ago Centrilobular emphysema ([place])   [place], Barbaraann Faster, NP       Future Appointments             In 3 months Cannady, Barbaraann Faster, NP [place], [facility]             pregabalin (LYRICA) 25 MG capsule [Pharmacy Med Name: PREGABALIN 25 MG CAP] 180 capsule     Sig: TAKE ONE CAPSULE BY MOUTH TWICE A DAY     Not Delegated - Neurology:  Anticonvulsants - Controlled - pregabalin Failed - 12/02/2022 10:47 AM      Failed - This refill cannot be delegated      Failed - Cr in normal range and within 360 days    Creatinine  Date Value Ref Range Status  06/23/2014 1.57 (H) 0.60 - 1.30 mg/dL Final   Creatinine, Ser  Date Value Ref Range Status  09/01/2022 1.89 (H) 0.57 - 1.00 mg/dL Final         Passed - Completed  PHQ-2 or PHQ-9 in the last 360 days      Passed - Valid encounter within last 12 months    Recent Outpatient Visits           1 week ago Subacute cough   [place], Henrine Screws T, NP   1 month ago Chronic low back pain (Location of Primary Source of Pain) (Bilateral) (R>L)   [place], Henrine Screws T, NP   3 months ago Schizophrenia, unspecified type ([place])   [place], Henrine Screws T, NP   7 months ago Chronic pain syndrome   [place] [name], NP   9 months ago Centrilobular emphysema [facility])   [place], Barbaraann Faster, NP       Future Appointments             In 3 months Cannady, Barbaraann Faster, NP [place], [facility]

## 2022-12-02 NOTE — Telephone Encounter (Signed)
Patient called to get an update on medication refill. Patient state she is out of medication. Please advise.

## 2022-12-02 NOTE — Telephone Encounter (Signed)
Requested medication (s) are due for refill today:yes  Requested medication (s) are on the active medication list: yes  Last refill:  09/01/22  Future visit scheduled: yes  Notes to clinic:  Unable to refill per protocol, cannot delegate.      Requested Prescriptions  Pending Prescriptions Disp Refills   pregabalin (LYRICA) 25 MG capsule [Pharmacy Med Name: PREGABALIN 25 MG CAP] 180 capsule     Sig: TAKE ONE CAPSULE BY MOUTH TWICE A DAY     Not Delegated - Neurology:  Anticonvulsants - Controlled - pregabalin Failed - 12/02/2022 10:47 AM      Failed - This refill cannot be delegated      Failed - Cr in normal range and within 360 days    Creatinine  Date Value Ref Range Status  06/23/2014 1.57 (H) 0.60 - 1.30 mg/dL Final   Creatinine, Ser  Date Value Ref Range Status  09/01/2022 1.89 (H) 0.57 - 1.00 mg/dL Final         Passed - Completed PHQ-2 or PHQ-9 in the last 360 days      Passed - Valid encounter within last 12 months    Recent Outpatient Visits           1 week ago Subacute cough   [place], Henrine Screws T, NP   1 month ago Chronic low back pain (Location of Primary Source of Pain) (Bilateral) (R>L)   [place], Henrine Screws T, NP   3 months ago Schizophrenia, unspecified type ([place])   [place], Henrine Screws T, NP   7 months ago Chronic pain syndrome   [place] [name], NP   9 months ago Centrilobular emphysema ([place])   [place], [place] T, NP       Future Appointments             In 3 months Cannady, [place] T, NP [place], [facility]            Signed Prescriptions Disp Refills   raloxifene (EVISTA) 60 MG tablet 90 tablet 0    Sig: TAKE 1 TABLET BY MOUTH DAILY     OB/GYN: Selective Estrogen Receptor Modulators 2 Failed - 12/02/2022 10:47 AM      Failed - Bone Mineral  Density or Dexa Scan completed in the last 2 years      Passed - Ca in normal range and within 360 days    Calcium  Date Value Ref Range Status  09/01/2022 9.3 8.7 - 10.3 mg/dL Final   Calcium, Total  Date Value Ref Range Status  06/23/2014 8.1 (L) 8.5 - 10.1 mg/dL Final         Passed - Valid encounter within last 12 months    Recent Outpatient Visits           1 week ago Subacute cough   [place], Henrine Screws T, NP   1 month ago Chronic low back pain (Location of Primary Source of Pain) (Bilateral) (R>L)   [place], Henrine Screws T, NP   3 months ago Schizophrenia, unspecified type ([place])   [place], Henrine Screws T, NP   7 months ago Chronic pain syndrome   [place] [name], NP   9 months ago Centrilobular emphysema [facility])   [place],  Barbaraann Faster, NP       Future Appointments             In 3 months Cannady, Barbaraann Faster, NP [place], [facility]

## 2022-12-23 ENCOUNTER — Other Ambulatory Visit: Payer: Self-pay | Admitting: Nurse Practitioner

## 2022-12-24 NOTE — Telephone Encounter (Signed)
Requested Prescriptions  Pending Prescriptions Disp Refills   lisinopril (ZESTRIL) 5 MG tablet [Pharmacy Med Name: LISINOPRIL 5 MG TAB] 90 tablet 0    Sig: TAKE 1 TABLET BY MOUTH DAILY     Cardiovascular:  ACE Inhibitors Failed - 12/23/2022  9:19 AM      Failed - Cr in normal range and within 180 days    Creatinine  Date Value Ref Range Status  06/23/2014 1.57 (H) 0.60 - 1.30 mg/dL Final   Creatinine, Ser  Date Value Ref Range Status  09/01/2022 1.89 (H) 0.57 - 1.00 mg/dL Final         Passed - K in normal range and within 180 days    Potassium  Date Value Ref Range Status  09/01/2022 4.5 3.5 - 5.2 mmol/L Final  06/23/2014 5.0 3.5 - 5.1 mmol/L Final         Passed - Patient is not pregnant      Passed - Last BP in normal range    BP Readings from Last 1 Encounters:  11/21/22 138/84         Passed - Valid encounter within last 6 months    Recent Outpatient Visits           1 month ago Subacute cough   [place], Henrine Screws T, NP   2 months ago Chronic low back pain (Location of Primary Source of Pain) (Bilateral) (R>L)   [place], Henrine Screws T, NP   3 months ago Schizophrenia, unspecified type ([place])   [place], Henrine Screws T, NP   7 months ago Chronic pain syndrome    [facility] [name], NP   9 months ago Centrilobular emphysema [facility]   [place], Barbaraann Faster, NP       Future Appointments             In 2 months Cannady, Barbaraann Faster, NP [place], [facility]

## 2023-01-05 ENCOUNTER — Ambulatory Visit (INDEPENDENT_AMBULATORY_CARE_PROVIDER_SITE_OTHER): Payer: 59

## 2023-01-05 VITALS — Ht 59.5 in | Wt 132.0 lb

## 2023-01-05 DIAGNOSIS — Z0000 Encounter for general adult medical examination without abnormal findings: Secondary | ICD-10-CM | POA: Diagnosis not present
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
## 2023-01-05 NOTE — Progress Notes (Signed)
I connected with  [name] on 01/05/23 by a audio enabled telemedicine application and verified that I am speaking with the correct person using two identifiers.  Patient Location: Home  Provider Location: Office/Clinic  I discussed the limitations of evaluation and management by telemedicine. The patient expressed understanding and agreed to proceed.  Subjective:   MANE WEITMAN is a 72 y.o. female who presents for Medicare Annual (Subsequent) preventive examination.  Review of Systems     Cardiac Risk Factors include: advanced age (>38mn, >>21women);hypertension     Objective:    Today's Vitals   01/05/23 1547  Weight: 132 lb (59.9 kg)  Height: 4' 11.5" (1.511 m)   Body mass index is 26.21 kg/m.     01/05/2023    3:32 PM 04/01/2022   11:21 AM 11/27/2021   12:18 PM 11/19/2020   10:41 AM 08/30/2020    3:30 PM 05/10/2020    3:48 PM 11/16/2019   11:13 AM  Advanced Directives  Does Patient Have a Medical Advance Directive? No No No No No No No  Would patient like information on creating a medical advance directive? No - Patient declined  No - Patient declined  No - Patient declined No - Patient declined     Current Medications (verified) Outpatient Encounter Medications as of 01/05/2023  Medication Sig   albuterol (VENTOLIN HFA) 108 (90 Base) MCG/ACT inhaler Inhale 2 puffs into the lungs every 6 (six) hours as needed for wheezing or shortness of breath.   atorvastatin (LIPITOR) 10 MG tablet TAKE 1 TABLET BY MOUTH DAILY AT 6 PM   diclofenac Sodium (VOLTAREN) 1 % GEL APPLY 4 GRAMS TOPICALLY 4 TIMES DAILY   famotidine (PEPCID) 20 MG tablet TAKE 1 TABLET BY MOUTH DAILY   lisinopril (ZESTRIL) 5 MG tablet TAKE 1 TABLET BY MOUTH DAILY   mirtazapine (REMERON) 15 MG tablet Take 0.5 tablets (7.5 mg total) by mouth at bedtime.   raloxifene (EVISTA) 60 MG tablet TAKE 1 TABLET BY MOUTH DAILY   risperiDONE (RISPERDAL) 2 MG tablet TAKE 1/2 TO 1 TABLET BY MOUTH AT BEDTIME    tiZANidine (ZANAFLEX) 4 MG tablet TAKE 1 TABLET BY MOUTH EVERY 6 HOURS AS NEEDED FOR MUSCLE SPASMS   benzonatate (TESSALON PERLES) 100 MG capsule Take 1 capsule (100 mg total) by mouth 3 (three) times daily as needed for cough. (Patient not taking: Reported on 01/05/2023)   ondansetron (ZOFRAN ODT) 4 MG disintegrating tablet Take 1 tablet (4 mg total) by mouth every 8 (eight) hours as needed for nausea or vomiting. (Patient not taking: Reported on 01/05/2023)   pregabalin (LYRICA) 25 MG capsule TAKE ONE CAPSULE BY MOUTH TWICE A DAY (Patient not taking: Reported on 01/05/2023)   No facility-administered encounter medications on file as of 01/05/2023.    Allergies (verified) No known allergies   History: Past Medical History:  Diagnosis Date   Abnormal weight loss 06/07/2015   Allergy    Cataract    Chronic kidney disease    Chronic pain syndrome    Hyperlipidemia    Hypertension    Leg pain    Osteoporosis    hips   Renal insufficiency    Schizophrenia (HCC)    Thrombocytopenia (HCC)    Tobacco use disorder    Past Surgical History:  Procedure Laterality Date   ankle surgery Right    x's 2   CATARACT EXTRACTION     Family History  Problem Relation Age of Onset   Hyperlipidemia  Mother    Hypertension Mother    Social History   Socioeconomic History   Marital status: Single    Spouse name: Not on file   Number of children: Not on file   Years of education: Not on file   Highest education level: Not on file  Occupational History   Occupation: retired  Tobacco Use   Smoking status: Former    Packs/day: 1.00    Years: 43.00    Total pack years: 43.00    Types: Cigarettes    Quit date: 07/21/2017    Years since quitting: 5.4   Smokeless tobacco: Former    Types: Snuff  Vaping Use   Vaping Use: Never used  Substance and Sexual Activity   Alcohol use: No   Drug use: No   Sexual activity: Yes  Other Topics Concern   Not on file  Social History Narrative   Not on  file   Social Determinants of Health   Financial Resource Strain: Low Risk  (01/05/2023)   Overall Financial Resource Strain (CARDIA)    Difficulty of Paying Living Expenses: Not hard at all  Food Insecurity: No Food Insecurity (01/05/2023)   Hunger Vital Sign    Worried About Running Out of Food in the Last Year: Never true    [place] in the Last Year: Never true  Transportation Needs: No Transportation Needs (01/05/2023)   PRAPARE - Hydrologist (Medical): No    Lack of Transportation (Non-Medical): No  Physical Activity: Insufficiently Active (01/05/2023)   Exercise Vital Sign    Days of Exercise per Week: 2 days    Minutes of Exercise per Session: 20 min  Stress: No Stress Concern Present (01/05/2023)   [place]    Feeling of Stress : Not at all  Social Connections: Socially Isolated (01/05/2023)   Social Connection and Isolation Panel [NHANES]    Frequency of Communication with Friends and Family: More than three times a week    Frequency of Social Gatherings with Friends and Family: More than three times a week    Attends Religious Services: Never    Marine scientist or Organizations: No    Attends Music therapist: Never    Marital Status: Divorced    Tobacco Counseling Counseling given: Not Answered   Clinical Intake:  Pre-visit preparation completed: Yes        Diabetes: No  How often do you need to have someone help you when you read instructions, pamphlets, or other written materials from your doctor or pharmacy?: 1 - Never  Diabetic?no  Interpreter Needed?: No  Information entered by :: Kirke Shaggy, LPN   Activities of Daily Living    01/05/2023    3:32 PM  In your present state of health, do you have any difficulty performing the following activities:  Hearing? 0  Vision? 0  Difficulty concentrating or making decisions? 0   Walking or climbing stairs? 1  Dressing or bathing? 0  Doing errands, shopping? 0  Preparing Food and eating ? N  Using the Toilet? N  In the past six months, have you accidently leaked urine? N  Do you have problems with loss of bowel control? N  Managing your Medications? N  Managing your Finances? N  Housekeeping or managing your Housekeeping? N    Patient Care Team: Venita Lick, NP as PCP - General (Nurse Practitioner) Delight Hoh  J, MD as Consulting Physician (Oncology)  Indicate any recent Medical Services you may have received from other than [facility] providers in the past year (date may be approximate).     Assessment:   This is a routine wellness examination for Alessia.  Hearing/Vision screen Hearing Screening - Comments:: No aids Vision Screening - Comments:: Wears glasses- [facility]  Dietary issues and exercise activities discussed: Current Exercise Habits: Home exercise routine, Type of exercise: walking, Time (Minutes): 20, Frequency (Times/Week): 2, Weekly Exercise (Minutes/Week): 40, Intensity: Mild   Goals Addressed             This Visit's Progress    DIET - EAT MORE FRUITS AND VEGETABLES         Depression Screen    01/05/2023    3:30 PM 11/21/2022    3:26 PM 10/13/2022    1:18 PM 09/01/2022    1:32 PM 05/06/2022    2:57 PM 05/06/2022    2:10 PM 11/27/2021   12:18 PM  PHQ 2/9 Scores  PHQ - 2 Score 0 0 0 '3 2 2 '$ 0  PHQ- 9 Score 0 0 0 '15 16 14     '$ Fall Risk    01/05/2023    3:32 PM 11/21/2022    3:26 PM 10/13/2022    1:18 PM 09/01/2022    1:32 PM 05/06/2022    2:57 PM  Fall Risk   Falls in the past year? 0 0 0 0 1  Number falls in past yr: 0 0 0 0 1  Injury with Fall? 0 0 0 0 0  Risk for fall due to : No Fall Risks No Fall Risks No Fall Risks No Fall Risks History of fall(s)  Follow up Falls prevention discussed;Falls evaluation completed Falls evaluation completed Falls evaluation completed Falls evaluation completed Falls  evaluation completed    FALL RISK PREVENTION PERTAINING TO THE HOME:  Any stairs in or around the home? Yes  If so, are there any without handrails? No  Home free of loose throw rugs in walkways, pet beds, electrical cords, etc? Yes  Adequate lighting in your home to reduce risk of falls? Yes   ASSISTIVE DEVICES UTILIZED TO PREVENT FALLS:  Life alert? No  Use of a cane, walker or w/c? Yes uses a cane Grab bars in the bathroom? Yes  Shower chair or bench in shower? No  Elevated toilet seat or a handicapped toilet? No    Cognitive Function:        01/05/2023    3:39 PM 11/27/2021   12:05 PM 11/19/2020   10:47 AM 11/11/2018   11:33 AM 09/16/2017   10:15 AM  6CIT Screen  What Year? 0 points 0 points 0 points 0 points 0 points  What month? 0 points 0 points 0 points 0 points 0 points  What time? 0 points 0 points 0 points 0 points 0 points  Count back from 20 0 points 0 points 2 points 0 points 0 points  Months in reverse 0 points 0 points 2 points 2 points 0 points  Repeat phrase 6 points 0 points 10 points 4 points 8 points  Total Score 6 points 0 points 14 points 6 points 8 points    Immunizations Immunization History  Administered Date(s) Administered   Fluad Quad(high Dose 65+) 08/16/2019, 08/06/2020, 08/27/2021, 09/01/2022   Influenza, High Dose Seasonal PF 08/08/2016, 09/16/2017, 07/30/2018   Influenza,inj,Quad PF,6+ Mos 01/01/2016   PFIZER(Purple Top)SARS-COV-2 Vaccination 02/16/2020, 03/08/2020   Pneumococcal Conjugate-13  10/16/2014   Pneumococcal Polysaccharide-23 01/01/2016   Td 04/15/1999, 07/26/2009    TDAP status: Due, Education has been provided regarding the importance of this vaccine. Advised may receive this vaccine at local pharmacy or Health Dept. Aware to provide a copy of the vaccination record if obtained from local pharmacy or Health Dept. Verbalized acceptance and understanding.  Flu Vaccine status: Up to date  Pneumococcal vaccine status: Due,  Education has been provided regarding the importance of this vaccine. Advised may receive this vaccine at local pharmacy or Health Dept. Aware to provide a copy of the vaccination record if obtained from local pharmacy or Health Dept. Verbalized acceptance and understanding.  Covid-19 vaccine status: Completed vaccines  Qualifies for Shingles Vaccine? Yes   Zostavax completed No   Shingrix Completed?: No.    Education has been provided regarding the importance of this vaccine. Patient has been advised to call insurance company to determine out of pocket expense if they have not yet received this vaccine. Advised may also receive vaccine at local pharmacy or Health Dept. Verbalized acceptance and understanding.  Screening Tests Health Maintenance  Topic Date Due   Zoster Vaccines- Shingrix (1 of 2) Never done   DEXA SCAN  Never done   Lung Cancer Screening  02/03/2018   DTaP/Tdap/Td (3 - Tdap) 07/27/2019   COVID-19 Vaccine (3 - Pfizer risk series) 04/05/2020   COLON CANCER SCREENING ANNUAL FOBT  02/15/2021   Medicare Annual Wellness (AWV)  01/06/2024   MAMMOGRAM  02/12/2024   Pneumonia Vaccine 36+ Years old  Completed   INFLUENZA VACCINE  Completed   Hepatitis C Screening  Completed   HPV VACCINES  Aged Out   COLONOSCOPY (Pts 45-53yr Insurance coverage will need to be confirmed)  Discontinued   Fecal DNA (Cologuard)  Discontinued    Health Maintenance  Health Maintenance Due  Topic Date Due   Zoster Vaccines- Shingrix (1 of 2) Never done   DEXA SCAN  Never done   Lung Cancer Screening  02/03/2018   DTaP/Tdap/Td (3 - Tdap) 07/27/2019   COVID-19 Vaccine (3 - Pfizer risk series) 04/05/2020   COLON CANCER SCREENING ANNUAL FOBT  02/15/2021    Colorectal cancer screening: Type of screening: FOBT/FIT. Completed 02/16/20. Repeat every 1 years  Mammogram status: Completed 02/11/22. Repeat every year    Lung Cancer Screening: (Low Dose CT Chest recommended if Age 74-80years, 30  pack-year currently smoking OR have quit w/in 15years.) does not qualify.   Additional Screening:  Hepatitis C Screening: does qualify; Completed 10/08/16  Vision Screening: Recommended annual ophthalmology exams for early detection of glaucoma and other disorders of the eye. Is the patient up to date with their annual eye exam?  Yes  Who is the provider or what is the name of the office in which the patient attends annual eye exams? ASan PatricioIf pt is not established with a provider, would they like to be referred to a provider to establish care? No .   Dental Screening: Recommended annual dental exams for proper oral hygiene  Community Resource Referral / Chronic Care Management: CRR required this visit?  No   CCM required this visit?  No      Plan:     I have personally reviewed and noted the following in the patient's chart:   Medical and social history Use of alcohol, tobacco or illicit drugs  Current medications and supplements including opioid prescriptions. Patient is not currently taking opioid prescriptions. Functional ability and status Nutritional  status Physical activity Advanced directives List of other physicians Hospitalizations, surgeries, and ER visits in previous 12 months Vitals Screenings to include cognitive, depression, and falls Referrals and appointments  In addition, I have reviewed and discussed with patient certain preventive protocols, quality metrics, and best practice recommendations. A written personalized care plan for preventive services as well as general preventive health recommendations were provided to patient.     Dionisio David, LPN   [id number]   Nurse Notes: none

## 2023-01-20 ENCOUNTER — Other Ambulatory Visit: Payer: Self-pay

## 2023-01-20 MED ORDER — MIRTAZAPINE 15 MG PO TABS: 7.5000 mg | ORAL_TABLET | Freq: Every day | ORAL | 4 refills | Status: DC

## 2023-01-20 NOTE — Telephone Encounter (Signed)
Medication refill for mirtazapine '15mg'$  last ov 11/21/22, upcoming ov 03/16/23 . Please advise

## 2023-03-02 ENCOUNTER — Other Ambulatory Visit: Payer: Self-pay | Admitting: Nurse Practitioner

## 2023-03-03 NOTE — Telephone Encounter (Signed)
Requested Prescriptions  Pending Prescriptions Disp Refills   raloxifene (EVISTA) 60 MG tablet [Pharmacy Med Name: RALOXIFENE HCL 60 MG TAB] 90 tablet 0    Sig: TAKE 1 TABLET BY MOUTH DAILY     OB/GYN: Selective Estrogen Receptor Modulators 2 Failed - 03/02/2023 10:33 AM      Failed - Bone Mineral Density or Dexa Scan completed in the last 2 years      Passed - Ca in normal range and within 360 days    Calcium  Date Value Ref Range Status  09/01/2022 9.3 8.7 - 10.3 mg/dL Final   Calcium, Total  Date Value Ref Range Status  06/23/2014 8.1 (L) 8.5 - 10.1 mg/dL Final         Passed - Valid encounter within last 12 months    Recent Outpatient Visits           3 months ago Subacute cough   [place] [facility] [place], [place] T, NP   4 months ago Chronic low back pain (Location of Primary Source of Pain) (Bilateral) (R>L)   [place] [facility] [place], Corrie Dandy T, NP   6 months ago Schizophrenia, unspecified type (HCC)   [facility] [place], Corrie Dandy T, NP   10 months ago Chronic pain syndrome   [place] [facility] Gabriel Cirri, NP   1 year ago Centrilobular emphysema (HCC)   [place] [facility] [place], Dorie Rank, NP       Future Appointments             In 1 week Cannady, Dorie Rank, NP [place] [facility]             tiZANidine (ZANAFLEX) 4 MG tablet [Pharmacy Med Name: TIZANIDINE HCL 4 MG TAB] 60 tablet 4    Sig: TAKE 1 TABLET BY MOUTH EVERY 6 HOURS AS NEEDED FOR MUSCLE SPASMS     Not Delegated - Cardiovascular:  Alpha-2 Agonists - tizanidine Failed - 03/02/2023 10:33 AM      Failed - This refill cannot be delegated      Passed - Valid encounter within last 6 months    Recent Outpatient Visits           3 months ago Subacute cough   [place] [facility] [place], [place] T, NP   4 months ago Chronic low back pain (Location of Primary Source  of Pain) (Bilateral) (R>L)   [place] [facility] [place], Corrie Dandy T, NP   6 months ago Schizophrenia, unspecified type (HCC)   [place] [facility] [place], Corrie Dandy T, NP   10 months ago Chronic pain syndrome   [facility] Gabriel Cirri, NP   1 year ago Centrilobular emphysema [facility])   [place] [facility] [place], Dorie Rank, NP       Future Appointments             In 1 week Cannady, Dorie Rank, NP [facility], [facility]

## 2023-03-03 NOTE — Telephone Encounter (Signed)
Requested medication (s) are due for refill today: yes  Requested medication (s) are on the active medication list: yes  Last refill:  09/23/22  Future visit scheduled: yes  Notes to clinic:  Unable to refill per protocol, cannot delegate.      Requested Prescriptions  Pending Prescriptions Disp Refills   tiZANidine (ZANAFLEX) 4 MG tablet [Pharmacy Med Name: TIZANIDINE HCL 4 MG TAB] 60 tablet 4    Sig: TAKE 1 TABLET BY MOUTH EVERY 6 HOURS AS NEEDED FOR MUSCLE SPASMS     Not Delegated - Cardiovascular:  Alpha-2 Agonists - tizanidine Failed - 03/02/2023 10:33 AM      Failed - This refill cannot be delegated      Passed - Valid encounter within last 6 months    Recent Outpatient Visits           3 months ago Subacute cough   [place] [facility] [place], [place] T, NP   4 months ago Chronic low back pain (Location of Primary Source of Pain) (Bilateral) (R>L)   [place] [facility] [place], Corrie Dandy T, NP   6 months ago Schizophrenia, unspecified type (HCC)   [place] [facility] [place], Corrie Dandy T, NP   10 months ago Chronic pain syndrome   [facility] Gabriel Cirri, NP   1 year ago Centrilobular emphysema (HCC)   [place] [facility] [place], Corrie Dandy T, NP       Future Appointments             In 1 week Cannady, Dorie Rank, NP [place] [facility], [facility]            Signed Prescriptions Disp Refills   raloxifene (EVISTA) 60 MG tablet 90 tablet 0    Sig: TAKE 1 TABLET BY MOUTH DAILY     OB/GYN: Selective Estrogen Receptor Modulators 2 Failed - 03/02/2023 10:33 AM      Failed - Bone Mineral Density or Dexa Scan completed in the last 2 years      Passed - Ca in normal range and within 360 days    Calcium  Date Value Ref Range Status  09/01/2022 9.3 8.7 - 10.3 mg/dL Final   Calcium, Total  Date Value Ref Range Status  06/23/2014 8.1 (L) 8.5 - 10.1 mg/dL Final          Passed - Valid encounter within last 12 months    Recent Outpatient Visits           3 months ago Subacute cough   [place] [facility] [place], [place] T, NP   4 months ago Chronic low back pain (Location of Primary Source of Pain) (Bilateral) (R>L)   [place] [facility] [place], Corrie Dandy T, NP   6 months ago Schizophrenia, unspecified type (HCC)   [facility] [place], Corrie Dandy T, NP   10 months ago Chronic pain syndrome   [facility] Gabriel Cirri, NP   1 year ago Centrilobular emphysema [facility])   [place] [place], Dorie Rank, NP       Future Appointments             In 1 week Cannady, Dorie Rank, NP  [facility], [facility]

## 2023-03-16 ENCOUNTER — Ambulatory Visit: Payer: 59 | Admitting: Nurse Practitioner

## 2023-03-24 ENCOUNTER — Other Ambulatory Visit: Payer: Self-pay | Admitting: Nurse Practitioner

## 2023-03-24 NOTE — Telephone Encounter (Signed)
Requested Prescriptions  Pending Prescriptions Disp Refills   lisinopril (ZESTRIL) 5 MG tablet [Pharmacy Med Name: LISINOPRIL 5 MG TAB] 30 tablet 0    Sig: TAKE 1 TABLET BY MOUTH DAILY     Cardiovascular:  ACE Inhibitors Failed - 03/24/2023 12:39 PM      Failed - Cr in normal range and within 180 days    Creatinine  Date Value Ref Range Status  06/23/2014 1.57 (H) 0.60 - 1.30 mg/dL Final   Creatinine, Ser  Date Value Ref Range Status  09/01/2022 1.89 (H) 0.57 - 1.00 mg/dL Final         Failed - K in normal range and within 180 days    Potassium  Date Value Ref Range Status  09/01/2022 4.5 3.5 - 5.2 mmol/L Final  06/23/2014 5.0 3.5 - 5.1 mmol/L Final         Passed - Patient is not pregnant      Passed - Last BP in normal range    BP Readings from Last 1 Encounters:  11/21/22 138/84         Passed - Valid encounter within last 6 months    Recent Outpatient Visits           4 months ago Subacute cough   [place] [facility] [place], [place] T, NP   5 months ago Chronic low back pain (Location of Primary Source of Pain) (Bilateral) (R>L)   [place] [facility] [place], Corrie Dandy T, NP   6 months ago Schizophrenia, unspecified type (HCC)   [place] [facility] [place], Corrie Dandy T, NP   10 months ago Chronic pain syndrome   [place] [facility] Gabriel Cirri, NP   1 year ago Centrilobular emphysema [facility])   [facility] [place], Dorie Rank, NP       Future Appointments             In 3 weeks Cannady, Dorie Rank, NP  [facility], [facility]

## 2023-04-01 ENCOUNTER — Other Ambulatory Visit: Payer: Self-pay | Admitting: Nurse Practitioner

## 2023-04-01 DIAGNOSIS — M545 Low back pain, unspecified: Secondary | ICD-10-CM

## 2023-04-01 NOTE — Telephone Encounter (Signed)
Requested medication (s) are due for refill today: Yes  Requested medication (s) are on the active medication list: Yes  Last refill:  11/22/21  Future visit scheduled: Yes  Notes to clinic:  See request.    Requested Prescriptions  Pending Prescriptions Disp Refills   diclofenac Sodium (VOLTAREN) 1 % GEL [Pharmacy Med Name: DICLOFENAC SODIUM 1% TOP GEL GM] 100 g 4    Sig: APPLY 4 GRAMS TOPICALLY 4 TIMES DAILY     Analgesics:  Topicals Failed - 04/01/2023 12:11 PM      Failed - Manual Review: Labs are only required if the patient has taken medication for more than 8 weeks.      Failed - PLT in normal range and within 360 days    Platelets  Date Value Ref Range Status  09/01/2022 105 (L) 150 - 450 x10E3/uL Final         Failed - Cr in normal range and within 360 days    Creatinine  Date Value Ref Range Status  06/23/2014 1.57 (H) 0.60 - 1.30 mg/dL Final   Creatinine, Ser  Date Value Ref Range Status  09/01/2022 1.89 (H) 0.57 - 1.00 mg/dL Final         Failed - eGFR is 30 or above and within 360 days    EGFR (African American)  Date Value Ref Range Status  06/23/2014 40 (L)  Final   GFR calc Af Amer  Date Value Ref Range Status  11/06/2020 24 (L) >59 mL/min/1.73 Final    Comment:    **In accordance with recommendations from the NKF-ASN Task force,**   [facility] is in the process of updating its eGFR calculation to the   2021 CKD-EPI creatinine equation that estimates kidney function   without a race variable.    EGFR (Non-African Amer.)  Date Value Ref Range Status  06/23/2014 34 (L)  Final    Comment:    eGFR values <39mL/min/1.73 m2 may be an indication of chronic kidney disease (CKD). Calculated eGFR is useful in patients with stable renal function. The eGFR calculation will not be reliable in acutely ill patients when serum creatinine is changing rapidly. It is not useful in  patients on dialysis. The eGFR calculation may not be applicable to patients at  the low and high extremes of body sizes, pregnant women, and vegetarians.    GFR, Estimated  Date Value Ref Range Status  04/01/2022 30 (L) >60 mL/min Final    Comment:    (NOTE) Calculated using the CKD-EPI Creatinine Equation (2021)    eGFR  Date Value Ref Range Status  09/01/2022 28 (L) >59 mL/min/1.73 Final         Passed - HGB in normal range and within 360 days    Hemoglobin  Date Value Ref Range Status  09/01/2022 11.7 11.1 - 15.9 g/dL Final         Passed - HCT in normal range and within 360 days    Hematocrit  Date Value Ref Range Status  09/01/2022 35.4 34.0 - 46.6 % Final         Passed - Patient is not pregnant      Passed - Valid encounter within last 12 months    Recent Outpatient Visits           4 months ago Subacute cough   [place] [facility] [place], [place] T, NP   5 months ago Chronic low back pain (Location of Primary Source of Pain) (Bilateral) (  R>L)   [facility], Corrie Dandy T, NP   7 months ago Schizophrenia, unspecified type [facility])   [facility] [place], Corrie Dandy T, NP   11 months ago Chronic pain syndrome   [place] [facility] Gabriel Cirri, NP   1 year ago Centrilobular emphysema [facility])   [facility] Marjie Skiff, NP       Future Appointments             In 2 weeks Cannady, Dorie Rank, NP [place] [facility], [facility]

## 2023-04-15 ENCOUNTER — Ambulatory Visit: Payer: 59 | Admitting: Nurse Practitioner

## 2023-04-15 DIAGNOSIS — M810 Age-related osteoporosis without current pathological fracture: Secondary | ICD-10-CM

## 2023-04-15 DIAGNOSIS — J432 Centrilobular emphysema: Secondary | ICD-10-CM

## 2023-04-15 DIAGNOSIS — I10 Essential (primary) hypertension: Secondary | ICD-10-CM

## 2023-04-15 DIAGNOSIS — R3121 Asymptomatic microscopic hematuria: Secondary | ICD-10-CM

## 2023-04-15 DIAGNOSIS — F209 Schizophrenia, unspecified: Secondary | ICD-10-CM

## 2023-04-15 DIAGNOSIS — N184 Chronic kidney disease, stage 4 (severe): Secondary | ICD-10-CM

## 2023-04-15 DIAGNOSIS — D693 Immune thrombocytopenic purpura: Secondary | ICD-10-CM

## 2023-04-15 DIAGNOSIS — E559 Vitamin D deficiency, unspecified: Secondary | ICD-10-CM

## 2023-04-15 DIAGNOSIS — E7800 Pure hypercholesterolemia, unspecified: Secondary | ICD-10-CM

## 2023-04-15 DIAGNOSIS — I700 Atherosclerosis of aorta: Secondary | ICD-10-CM

## 2023-04-15 DIAGNOSIS — R7989 Other specified abnormal findings of blood chemistry: Secondary | ICD-10-CM

## 2023-04-15 DIAGNOSIS — G894 Chronic pain syndrome: Secondary | ICD-10-CM

## 2023-04-15 DIAGNOSIS — Z1211 Encounter for screening for malignant neoplasm of colon: Secondary | ICD-10-CM

## 2023-04-15 DIAGNOSIS — D472 Monoclonal gammopathy: Secondary | ICD-10-CM

## 2023-04-21 ENCOUNTER — Other Ambulatory Visit: Payer: Self-pay | Admitting: Nurse Practitioner

## 2023-04-21 ENCOUNTER — Other Ambulatory Visit: Payer: Self-pay | Admitting: Unknown Physician Specialty

## 2023-04-22 NOTE — Telephone Encounter (Signed)
Requested medication (s) are due for refill today: yes  Requested medication (s) are on the active medication list: yes  Last refill:  03/24/23 #30/0  Future visit scheduled: yes 05/08/23  Notes to clinic:  Unable to refill per protocol due to failed labs, no updated results.     Requested Prescriptions  Pending Prescriptions Disp Refills   lisinopril (ZESTRIL) 5 MG tablet [Pharmacy Med Name: LISINOPRIL 5 MG TAB] 30 tablet 0    Sig: TAKE 1 TABLET BY MOUTH DAILY     Cardiovascular:  ACE Inhibitors Failed - 04/21/2023 11:14 AM      Failed - Cr in normal range and within 180 days    Creatinine  Date Value Ref Range Status  06/23/2014 1.57 (H) 0.60 - 1.30 mg/dL Final   Creatinine, Ser  Date Value Ref Range Status  09/01/2022 1.89 (H) 0.57 - 1.00 mg/dL Final         Failed - K in normal range and within 180 days    Potassium  Date Value Ref Range Status  09/01/2022 4.5 3.5 - 5.2 mmol/L Final  06/23/2014 5.0 3.5 - 5.1 mmol/L Final         Passed - Patient is not pregnant      Passed - Last BP in normal range    BP Readings from Last 1 Encounters:  11/21/22 138/84         Passed - Valid encounter within last 6 months    Recent Outpatient Visits           5 months ago Subacute cough   [place] [facility], Corrie Dandy T, NP   6 months ago Chronic low back pain (Location of Primary Source of Pain) (Bilateral) (R>L)   [facility] [place], Corrie Dandy T, NP   7 months ago Schizophrenia, unspecified type (HCC)   [place] [facility] [place], Corrie Dandy T, NP   11 months ago Chronic pain syndrome   [place] [facility] Gabriel Cirri, NP   1 year ago Centrilobular emphysema [facility])   [place] [facility] [place], Dorie Rank, NP       Future Appointments             In 2 weeks Cannady, Dorie Rank, NP [place] [facility]

## 2023-04-22 NOTE — Telephone Encounter (Signed)
Requested Prescriptions  Pending Prescriptions Disp Refills   famotidine (PEPCID) 20 MG tablet [Pharmacy Med Name: FAMOTIDINE 20 MG TAB] 90 tablet 0    Sig: TAKE 1 TABLET BY MOUTH DAILY     Gastroenterology:  H2 Antagonists Passed - 04/21/2023 11:14 AM      Passed - Valid encounter within last 12 months    Recent Outpatient Visits           5 months ago Subacute cough   [place] [facility] [place], [place] T, NP   6 months ago Chronic low back pain (Location of Primary Source of Pain) (Bilateral) (R>L)   [place] [facility] [place], Corrie Dandy T, NP   7 months ago Schizophrenia, unspecified type [facility])   [place] [facility] [place], Corrie Dandy T, NP   11 months ago Chronic pain syndrome   [facility] Gabriel Cirri, NP   1 year ago Centrilobular emphysema [facility])   [place] [facility] [place], Dorie Rank, NP       Future Appointments             In 2 weeks Cannady, Dorie Rank, NP  [facility]

## 2023-05-06 NOTE — Patient Instructions (Signed)
Eating Plan for Chronic Obstructive Pulmonary Disease Chronic obstructive pulmonary disease (COPD) causes symptoms such as shortness of breath, coughing, and chest discomfort. These symptoms can make it difficult to eat enough to maintain a healthy weight. Generally, people with COPD should eat a diet that is high in calories, protein, and other nutrients to maintain body weight and to keep the lungs as healthy as possible. Depending on the medicines you take and other health conditions you may have, your health care provider may give you additional recommendations on what to eat or avoid. Talk with your health care provider about your goals for body weight, and work with a dietitian to develop an eating plan that is right for you. What are tips for following this plan? Reading food labels  Avoid foods with more than 300 milligrams (mg) of salt (sodium) per serving. Choose foods that contain at least 4 grams (g) of fiber per serving. Try to eat 20-30 g of fiber each day. Choose foods that are high in calories and protein, such as nuts, beans, yogurt, and cheese. Shopping Do not buy foods labeled as diet, low-calorie, or low-fat. If you are able to eat dairy products: Avoid low-fat or skim milk. Buy dairy products that have at least 2% fat. Buy nutritional supplement drinks. Buy grains and prepared foods labeled as enriched or fortified. Consider buying low-sodium, pre-made foods to conserve energy for eating. Cooking Add dry milk or protein powder to smoothies. Cook with healthy fats, such as olive oil, canola oil, sunflower oil, and grapeseed oil. Add oil, butter, cream cheese, or nut butters to foods to increase fat and calories. To make foods easier to chew and swallow: Cook vegetables, pasta, and rice until soft. Cut or grind meat into very small pieces. Dip breads in liquid. Meal planning  Eat when you feel hungry. Eat 5-6 small meals throughout the day. Drink 6-8 glasses of water  each day. Do not drink liquids with meals. Drink liquids at the end of the meal to avoid feeling full too quickly. Eat a variety of fruits and vegetables every day. Ask for assistance from family or friends with planning and preparing meals as needed. Avoid foods that cause you to feel bloated, such as carbonated drinks, fried foods, beans, broccoli, cabbage, and apples. For older adults, ask your local agency on aging whether you are eligible for meal assistance programs, such as Meals on Wheels. Lifestyle  Do not smoke. Eat slowly. Take small bites and chew food well before swallowing. Do not overeat. This may make it more difficult to breathe after eating. Sit up while eating. If needed, continue to use supplemental oxygen while eating. Rest or relax for 30 minutes before and after eating. Monitor your weight as told by your health care provider. Exercise as told by your health care provider. What foods should I eat? Fruits All fresh, dried, canned, or frozen fruits that do not cause gas. Vegetables All fresh, canned (no salt added), or frozen vegetables that do not cause gas. Grains Whole-grain bread. Enriched whole-grain pasta. Fortified whole-grain cereals. Fortified rice. Quinoa. Meats and other proteins Lean meat. Poultry. Fish. Dried beans. Unsalted nuts. Tofu. Eggs. Nut butters. Dairy Whole or 2% milk. Cheese. Yogurt. Fats and oils Olive oil. Canola oil. Butter. Margarine. Beverages Water. Vegetable juice (no salt added). Decaffeinated coffee. Decaffeinated or herbal tea. Seasonings and condiments Fresh or dried herbs. Low-salt or salt-free seasonings. Low-sodium soy sauce. The items listed above may not be a complete list of foods   and beverages you can eat. Contact a dietitian for more information. What foods should I avoid? Fruits Fruits that cause gas, such as apples or melon. Vegetables Vegetables that cause gas, such as broccoli, Brussels sprouts, cabbage,  cauliflower, and onions. Canned vegetables with added salt. Meats and other proteins Fried meat. Salt-cured meat. Processed meat. Dairy Fat-free or low-fat milk, yogurt, or cheese. Processed cheese. Beverages Carbonated drinks. Caffeinated drinks, such as coffee, tea, and soft drinks. Juice. Alcohol. Vegetable juice with added salt. Seasonings and condiments Salt. Seasoning mixes with salt. Soy sauce. Pickles. Other foods Clear soup or broth. Fried foods. Prepared frozen meals. The items listed above may not be a complete list of foods and beverages you should avoid. Contact a dietitian for more information. Summary COPD symptoms can make it difficult to eat enough to maintain a healthy weight. A COPD eating plan can help you maintain your body weight and keep your lungs as healthy as possible. Eat a diet that is high in calories, protein, and other nutrients. Read labels to make sure that you are getting the right nutrients. Cook foods to make them easier to chew and swallow. Eat 5-6 small meals throughout the day, and avoid foods that cause gas or make you feel bloated. This information is not intended to replace advice given to you by your health care provider. Make sure you discuss any questions you have with your health care provider. Document Revised: 09/04/2020 Document Reviewed: 09/04/2020 Elsevier Patient Education  2024 [employer]  

## 2023-05-08 ENCOUNTER — Encounter: Payer: Self-pay | Admitting: Nurse Practitioner

## 2023-05-08 ENCOUNTER — Ambulatory Visit (INDEPENDENT_AMBULATORY_CARE_PROVIDER_SITE_OTHER): Payer: 59 | Admitting: Nurse Practitioner

## 2023-05-08 VITALS — BP 122/81 | HR 65 | Temp 98.4°F | Wt 133.4 lb

## 2023-05-08 DIAGNOSIS — N184 Chronic kidney disease, stage 4 (severe): Secondary | ICD-10-CM

## 2023-05-08 DIAGNOSIS — Z23 Encounter for immunization: Secondary | ICD-10-CM

## 2023-05-08 DIAGNOSIS — I10 Essential (primary) hypertension: Secondary | ICD-10-CM

## 2023-05-08 DIAGNOSIS — J432 Centrilobular emphysema: Secondary | ICD-10-CM | POA: Diagnosis not present

## 2023-05-08 DIAGNOSIS — Z1211 Encounter for screening for malignant neoplasm of colon: Secondary | ICD-10-CM

## 2023-05-08 DIAGNOSIS — R7989 Other specified abnormal findings of blood chemistry: Secondary | ICD-10-CM

## 2023-05-08 DIAGNOSIS — D472 Monoclonal gammopathy: Secondary | ICD-10-CM | POA: Diagnosis not present

## 2023-05-08 DIAGNOSIS — F209 Schizophrenia, unspecified: Secondary | ICD-10-CM | POA: Diagnosis not present

## 2023-05-08 DIAGNOSIS — E7800 Pure hypercholesterolemia, unspecified: Secondary | ICD-10-CM

## 2023-05-08 DIAGNOSIS — D693 Immune thrombocytopenic purpura: Secondary | ICD-10-CM

## 2023-05-08 DIAGNOSIS — E559 Vitamin D deficiency, unspecified: Secondary | ICD-10-CM

## 2023-05-08 DIAGNOSIS — I700 Atherosclerosis of aorta: Secondary | ICD-10-CM | POA: Diagnosis not present

## 2023-05-08 DIAGNOSIS — R3121 Asymptomatic microscopic hematuria: Secondary | ICD-10-CM

## 2023-05-08 DIAGNOSIS — M810 Age-related osteoporosis without current pathological fracture: Secondary | ICD-10-CM

## 2023-05-08 DIAGNOSIS — G894 Chronic pain syndrome: Secondary | ICD-10-CM

## 2023-05-08 LAB — URINALYSIS, ROUTINE W REFLEX MICROSCOPIC
Bilirubin, UA: NEGATIVE
Glucose, UA: NEGATIVE
Ketones, UA: NEGATIVE
Nitrite, UA: NEGATIVE
Protein,UA: NEGATIVE
Specific Gravity, UA: <1.005 — ABNORMAL LOW (ref 1.005–1.030)
Urobilinogen, Ur: 0.2 mg/dL (ref 0.2–1.0)
pH, UA: 5 (ref 5.0–7.5)

## 2023-05-08 LAB — MICROSCOPIC EXAMINATION: Bacteria, UA: NONE SEEN

## 2023-05-08 MED ORDER — RISPERIDONE 2 MG PO TABS: 1.0000 mg | ORAL_TABLET | Freq: Every day | ORAL | 4 refills | Status: DC

## 2023-05-08 MED ORDER — FAMOTIDINE 20 MG PO TABS: 20.0000 mg | ORAL_TABLET | Freq: Every day | ORAL | 4 refills | Status: DC

## 2023-05-08 MED ORDER — RALOXIFENE HCL 60 MG PO TABS: 60.0000 mg | ORAL_TABLET | Freq: Every day | ORAL | 4 refills | Status: DC

## 2023-05-08 MED ORDER — ATORVASTATIN CALCIUM 10 MG PO TABS: ORAL_TABLET | ORAL | 4 refills | Status: DC

## 2023-05-08 NOTE — Assessment & Plan Note (Signed)
History of low levels, recheck today and adjust supplement as needed. ?

## 2023-05-08 NOTE — Assessment & Plan Note (Signed)
Ongoing.  Noted on CT 02/03/2017.  Is a former smoker, reports quitting >5 years ago.  No current inhalers.  Would benefit from spirometry in future, refuses today.  Has repeat lung CT ordered, but has not attended or scheduled this yet -- highly recommend she schedule this.  IS non compliant often with recommendations.  Will continue to work closely with CCM team on assist.

## 2023-05-08 NOTE — Assessment & Plan Note (Signed)
Recheck level today, is on Risperdal. Past level mild elevation, however is well controlled on Risperdal.

## 2023-05-08 NOTE — Assessment & Plan Note (Signed)
Recheck urine, has refused to see hematology or urology, but will continue to recommend this if ongoing.  Continues to have 2+ blood in urine.  Highly recommend she attend follow-up with hematology and nephrology, which she reports she will try to do.

## 2023-05-08 NOTE — Assessment & Plan Note (Signed)
Continue to collaborate with hematology, recent note reviewed.  She denies bleeding or increased bruising.  Have highly recommended not to miss appointments with them due to disease process and ongoing continuity of care. ?

## 2023-05-08 NOTE — Assessment & Plan Note (Signed)
Chronic, ongoing.  Improved with Lyrica on board, continue this regimen with Tylenol as needed.  Adjust dosing as needed.

## 2023-05-08 NOTE — Assessment & Plan Note (Signed)
Chronic, no recent DEXA in chart -- reports she has no ride to obtain. Continue Evista daily, refills sent, and recommend Vitamin D daily.  Check level today.  Recommend repeat DEXA and will continue to work on transportation with CCM.

## 2023-05-08 NOTE — Progress Notes (Signed)
BP 122/81   Pulse 65   Temp 98.4 F (36.9 C) (Oral)   Wt 133 lb 6.4 oz (60.5 kg)   LMP  (LMP Unknown)   SpO2 98%   BMI 26.49 kg/m    Subjective:    Patient ID: Nila Nephew, female    DOB: [birth year], 72 y.o.   MRN: [id number]  HPI: SHAQUILLE FISHBURN is a 71 y.o. female  Chief Complaint  Patient presents with   COPD   Hypertension   Hyperlipidemia   Depression    HYPERTENSION / HYPERLIPIDEMIA Continues on Lisinopril 5 MG daily and Atorvastatin 10 MG daily.  Satisfied with current treatment? yes Duration of hypertension: chronic BP monitoring frequency: not checking BP range:  BP medication side effects: no Duration of hyperlipidemia: chronic Cholesterol medication side effects: no Cholesterol supplements: none Medication compliance: fair compliance Aspirin: no Recent stressors: no Recurrent headaches: no Visual changes: no Palpitations: no Dyspnea: no Chest pain: no Lower extremity edema: no Dizzy/lightheaded: no   CHRONIC KIDNEY DISEASE Last saw nephrology on 04/10/22.  Was to return in October 2023, but missed visit, have discussed at length need to maintain these visits. CKD status: stable Medications renally dose: yes Previous renal evaluation: yes Pneumovax:  Up to Date Influenza Vaccine:  Up to Date   COPD History of smoking, started at 13 and quit 5 years ago. Had initial lung CT screening in 2018 which showed emphysema & aortic atherosclerosis and was to repeat in 12 months, missed screenings due to transportation issues. Has not obtained as instructed and CCM team has been involved attempting to help with transportation. COPD status: stable Satisfied with current treatment?: yes Oxygen use: no Dyspnea frequency: none Cough frequency: none Rescue inhaler frequency:  none Limitation of activity: no Productive cough: none Last Spirometry: unknown --refuses today Pneumovax: Up to Date Influenza: Up to Date   CHRONIC PAIN  Taking Lyrica 25 MG  BID, which is offering benefit to pain along with Tylenol. Pain control status: uncontrolled Duration: chronic Location: lower back and down legs Quality: dull, aching, and throbbing Current Pain Level: 2/10 Previous Pain Level: 5/10 Breakthrough pain: no Benefit from narcotic medications: no What Activities task can be accomplished with current medication? minimal Previous pain specialty evaluation: yes Non-narcotic analgesic meds: Tylenol Narcotic contract: no   MGUS/THROMBOCYTOPENIA Has not had colonoscopy, she has not been able because her relatives have not been attending with her and can not get someone to go with her. Saw GI on 05/01/20 last. Have been working with CCM team on assistance with this.   Followed by Dr. Orlie Dakin for MGUS and ITP, did see for follow-up on 04/01/22 and was to return in 6 months -- last CBC platelet was 105.  Fever: no Nausea: no Vomiting: no Weight loss: no, maintaining Decreased appetite: no Diarrhea: no Constipation: no Blood in stool: no Heartburn: yes Jaundice: no Rash: no Dysuria/urinary frequency: no Hematuria: no History of sexually transmitted disease: no Recurrent NSAID use: no   OSTEOPOROSIS Continues Evista. Satisfied with current treatment?: yes Medication side effects: no Medication compliance: good compliance Past osteoporosis medications/treatments: as above Adequate calcium & vitamin D: yes Intolerance to bisphosphonates:no Weight bearing exercises: yes   SCHIZOPHRENIA Taking Mirtazapine and Risperdal for mood.  Last Prolactin level 250. Mood status: stable Satisfied with current treatment?: yes Symptom severity: moderate  Duration of current treatment : chronic Side effects: no Medication compliance: good compliance Previous psychiatric medications: multiple Depressed mood: no Anxious mood: no Anhedonia: no  Significant weight loss or gain: no Insomnia: none Fatigue: no Feelings of worthlessness or guilt:  no Impaired concentration/indecisiveness: no Suicidal ideations: no Hopelessness: no Crying spells: no    05/08/2023    1:52 PM 01/05/2023    3:30 PM 11/21/2022    3:26 PM 10/13/2022    1:18 PM 09/01/2022    1:32 PM  Depression screen PHQ 2/9  Decreased Interest 0 0 0 0 3  Down, Depressed, Hopeless 0 0 0 0 0  PHQ - 2 Score 0 0 0 0 3  Altered sleeping 0 0 0 0 2  Tired, decreased energy 0 0 0 0 2  Change in appetite 0 0 0 0 2  Feeling bad or failure about yourself  0 0 0 0 3  Trouble concentrating 0 0 0 0 0  Moving slowly or fidgety/restless 0 0 0 0 3  Suicidal thoughts 0 0 0 0 0  PHQ-9 Score 0 0 0 0 15  Difficult doing work/chores Not difficult at all Not difficult at all Not difficult at all Not difficult at all        05/08/2023    1:53 PM 11/21/2022    3:26 PM 10/13/2022    1:18 PM 09/01/2022    1:32 PM  GAD 7 : Generalized Anxiety Score  Nervous, Anxious, on Edge 0 0 0 0  Control/stop worrying 0 0 0 0  Worry too much - different things 0 0 0 0  Trouble relaxing 0 0 0 0  Restless 0 0 0 0  Easily annoyed or irritable 0 0 0 0  Afraid - awful might happen 0 0 0 0  Total GAD 7 Score 0 0 0 0  Anxiety Difficulty Not difficult at all Not difficult at all Not difficult at all Not difficult at all   AIMS:  Facial and Oral Movements  Muscles of Facial Expression: Normal Lips and Perioral Area: None, normal  Jaw: None, normal  Tongue: None, normal Extremity Movements: none Upper (arms, wrists, hands, fingers): None, normal  Lower (legs, knees, ankles, toes): None, normal,  Trunk Movements: none Neck, shoulders, hips: None, normal,  Overall Severity : 0 Severity of abnormal movements (highest score from questions above): None, normal  Incapacitation due to abnormal movements: None, normal  Patient's awareness of abnormal movements (rate only patient's report): No Awareness Dental Status  Current problems with teeth and/or dentures?: No  Does patient usually wear dentures?:  No    Relevant past medical, surgical, family and social history reviewed and updated as indicated. Interim medical history since our last visit reviewed. Allergies and medications reviewed and updated.  Review of Systems  Constitutional:  Negative for activity change, appetite change, diaphoresis, fatigue and fever.  Respiratory:  Negative for cough, chest tightness, shortness of breath and wheezing.   Cardiovascular:  Negative for chest pain, palpitations and leg swelling.  Gastrointestinal: Negative.   Musculoskeletal:  Positive for back pain.  Neurological: Negative.   Psychiatric/Behavioral:  Negative for decreased concentration, self-injury, sleep disturbance and suicidal ideas. The patient is not nervous/anxious.    Per HPI unless specifically indicated above    Objective:    BP 122/81   Pulse 65   Temp 98.4 F (36.9 C) (Oral)   Wt 133 lb 6.4 oz (60.5 kg)   LMP  (LMP Unknown)   SpO2 98%   BMI 26.49 kg/m   Wt Readings from Last 3 Encounters:  05/08/23 133 lb 6.4 oz (60.5 kg)  01/05/23 132 lb (59.9  kg)  11/21/22 132 lb 4.8 oz (60 kg)    Physical Exam Vitals and nursing note reviewed.  Constitutional:      General: She is awake. She is not in acute distress.    Appearance: She is well-developed and well-groomed. She is not ill-appearing.  HENT:     Head: Normocephalic.     Right Ear: Hearing normal.     Left Ear: Hearing normal.     Nose: Nose normal.     Mouth/Throat:     Mouth: Mucous membranes are moist.  Eyes:     General: Lids are normal.        Right eye: No discharge.        Left eye: No discharge.     Conjunctiva/sclera: Conjunctivae normal.     Pupils: Pupils are equal, round, and reactive to light.  Neck:     Thyroid: No thyromegaly.     Vascular: No carotid bruit.  Cardiovascular:     Rate and Rhythm: Normal rate and regular rhythm.     Heart sounds: Normal heart sounds. No murmur heard.    No gallop.  Pulmonary:     Effort: Pulmonary effort is  normal. No accessory muscle usage or respiratory distress.     Breath sounds: Normal breath sounds.  Abdominal:     General: Bowel sounds are normal. There is no distension.     Palpations: Abdomen is soft.     Tenderness: There is no abdominal tenderness.     Hernia: No hernia is present.  Musculoskeletal:     Cervical back: Normal range of motion and neck supple.     Lumbar back: Tenderness present. No swelling, spasms or bony tenderness. Decreased range of motion. Negative right straight leg raise test and negative left straight leg raise test.     Right lower leg: No edema.     Left lower leg: No edema.  Lymphadenopathy:     Head:     Right side of head: No submental, submandibular, tonsillar, preauricular or posterior auricular adenopathy.     Left side of head: No submental, submandibular, tonsillar, preauricular or posterior auricular adenopathy.     Cervical: No cervical adenopathy.  Skin:    General: Skin is warm and dry.     Findings: No bruising or rash.  Neurological:     Mental Status: She is alert and oriented to person, place, and time.  Psychiatric:        Attention and Perception: Attention normal.        Mood and Affect: Mood normal.        Speech: Speech normal.        Behavior: Behavior normal. Behavior is cooperative.    Results for orders placed or performed in visit on 05/08/23  Microscopic Examination   Urine  Result Value Ref Range   WBC, UA 0-5 0 - 5 /hpf   RBC, Urine 3-10 (A) 0 - 2 /hpf   Epithelial Cells (non renal) 0-10 0 - 10 /hpf   Bacteria, UA None seen None seen/Few  Urinalysis, Routine w reflex microscopic  Result Value Ref Range   Specific Gravity, UA <1.005 (L) 1.005 - 1.030   pH, UA 5.0 5.0 - 7.5   Color, UA Yellow Yellow   Appearance Ur Clear Clear   Leukocytes,UA Trace (A) Negative   Protein,UA Negative Negative/Trace   Glucose, UA Negative Negative   Ketones, UA Negative Negative   RBC, UA 2+ (A) Negative   Bilirubin,  UA Negative  Negative   Urobilinogen, Ur 0.2 0.2 - 1.0 mg/dL   Nitrite, UA Negative Negative   Microscopic Examination See below:       Assessment & Plan:   Problem List Items Addressed This Visit       Cardiovascular and Mediastinum   Aortic atherosclerosis (HCC)    Chronic. Noted on CT imaging 08/07/20.  Continue statin for prevention.  Recommend continued cessation of smoking.  No ASA due to ITP.      Relevant Medications   atorvastatin (LIPITOR) 10 MG tablet   Hypertension    Chronic, stable.  BP well below goal today. Continue current medication regimen and adjust as needed.  Recommend she check BP at home at least 3 mornings a week and document.  DASH diet focus.  If low readings consider discontinuation or reduction to 2.5 MG of Lisinopril.  LABS: CMP, CBC, UA, TSH.        Relevant Medications   atorvastatin (LIPITOR) 10 MG tablet   Other Relevant Orders   CBC with Differential/Platelet   TSH     Respiratory   Centrilobular emphysema (HCC) - Primary    Ongoing.  Noted on CT 02/03/2017.  Is a former smoker, reports quitting >5 years ago.  No current inhalers.  Would benefit from spirometry in future, refuses today.  Has repeat lung CT ordered, but has not attended or scheduled this yet -- highly recommend she schedule this.  IS non compliant often with recommendations.  Will continue to work closely with CCM team on assist.        Musculoskeletal and Integument   Osteoporosis    Chronic, no recent DEXA in chart -- reports she has no ride to obtain. Continue Evista daily, refills sent, and recommend Vitamin D daily.  Check level today.  Recommend repeat DEXA and will continue to work on transportation with CCM.      Relevant Medications   raloxifene (EVISTA) 60 MG tablet   Other Relevant Orders   VITAMIN D 25 Hydroxy (Vit-D Deficiency, Fractures)   Prolactin     Genitourinary   CKD (chronic kidney disease) stage 4, GFR 15-29 ml/min (HCC)    Chronic, ongoing.  Continue current  medication regimen and collaboration with nephrology.  CCM collaboration continues.  Obtain CMP, UA, CBC.  Highly recommend she schedule follow-up with nephrology, have recommended all recent visits and she has yet to attend.  Discussed at length importance of nephrology involvement in CKD 4.      Relevant Orders   PTH, intact and calcium     Hematopoietic and Hemostatic   Idiopathic thrombocytopenic purpura (HCC) (Chronic)    Continue to collaborate with hematology, recent note reviewed.  She denies bleeding or increased bruising.  Have highly recommended not to miss appointments with them due to disease process and ongoing continuity of care.      Relevant Orders   CBC with Differential/Platelet     Other   Chronic pain syndrome (Chronic)    Chronic, ongoing.  Improved with Lyrica on board, continue this regimen with Tylenol as needed.  Adjust dosing as needed.      Elevated prolactin level    Recheck level today, is on Risperdal. Past level mild elevation, however is well controlled on Risperdal.      Hematuria    Recheck urine, has refused to see hematology or urology, but will continue to recommend this if ongoing.  Continues to have 2+ blood in urine.  Highly recommend  she attend follow-up with hematology and nephrology, which she reports she will try to do.      Relevant Orders   Urinalysis, Routine w reflex microscopic (Completed)   Microscopic Examination (Completed)   Hyperlipidemia    Chronic, ongoing.  Continue current medication regimen and adjust as needed.  Lipid panel today.        Relevant Medications   atorvastatin (LIPITOR) 10 MG tablet   Other Relevant Orders   Comprehensive metabolic panel   Lipid Panel w/o Chol/HDL Ratio   MGUS (monoclonal gammopathy of unknown significance)    Chronic. Continue to collaborate with hematology, recent note reviewed.  She denies bleeding or increased bruising.       Schizophrenia (HCC)    Chronic, ongoing.  Continue  current medication regimen, Risperdal, and adjust as needed. Has been on this regimen for years with benefit.  Does have underlying paranoia at baseline.  Refills up to date.  Prolactin level today.      Other Visit Diagnoses     Colon cancer screening       Fecal hemaoccult ordered today.   Relevant Orders   Fecal occult blood, imunochemical   Need for Td vaccine       Td vaccine provided in office today and educated patient.   Relevant Orders   Td vaccine greater than or equal to 7yo preservative free IM (Completed)        Follow up plan: Return in about 6 months (around 11/07/2023) for HTN/HLD, COPD, MOOD, MGUS, CKD, PAIN.

## 2023-05-08 NOTE — Assessment & Plan Note (Signed)
Chronic, ongoing.  Continue current medication regimen and collaboration with nephrology.  CCM collaboration continues.  Obtain CMP, UA, CBC.  Highly recommend she schedule follow-up with nephrology, have recommended all recent visits and she has yet to attend.  Discussed at length importance of nephrology involvement in CKD 4.

## 2023-05-08 NOTE — Assessment & Plan Note (Signed)
Chronic, ongoing.  Continue current medication regimen and adjust as needed. Lipid panel today. 

## 2023-05-08 NOTE — Assessment & Plan Note (Signed)
Chronic, ongoing.  Continue current medication regimen, Risperdal, and adjust as needed. Has been on this regimen for years with benefit.  Does have underlying paranoia at baseline.  Refills up to date.  Prolactin level today. 

## 2023-05-08 NOTE — Assessment & Plan Note (Signed)
Chronic. Continue to collaborate with hematology, recent note reviewed.  She denies bleeding or increased bruising.  

## 2023-05-08 NOTE — Assessment & Plan Note (Signed)
Chronic. Noted on CT imaging 08/07/20.  Continue statin for prevention.  Recommend continued cessation of smoking.  No ASA due to ITP. 

## 2023-05-08 NOTE — Assessment & Plan Note (Signed)
Chronic, stable.  BP well below goal today. Continue current medication regimen and adjust as needed.  Recommend she check BP at home at least 3 mornings a week and document.  DASH diet focus.  If low readings consider discontinuation or reduction to 2.5 MG of Lisinopril.  LABS: CMP, CBC, UA, TSH.

## 2023-05-09 LAB — COMPREHENSIVE METABOLIC PANEL
ALT: 9 IU/L (ref 0–32)
AST: 16 IU/L (ref 0–40)
Potassium: 4.6 mmol/L (ref 3.5–5.2)

## 2023-05-09 LAB — CBC WITH DIFFERENTIAL/PLATELET

## 2023-05-09 LAB — PTH, INTACT AND CALCIUM

## 2023-05-10 LAB — COMPREHENSIVE METABOLIC PANEL
Albumin: 4.1 g/dL (ref 3.8–4.8)
BUN: 22 mg/dL (ref 8–27)
Bilirubin Total: 0.4 mg/dL (ref 0.0–1.2)
CO2: 19 mmol/L — ABNORMAL LOW (ref 20–29)
Globulin, Total: 2.7 g/dL (ref 1.5–4.5)
eGFR: 31 mL/min/{1.73_m2} — ABNORMAL LOW (ref >59)

## 2023-05-10 LAB — CBC WITH DIFFERENTIAL/PLATELET

## 2023-05-10 LAB — LIPID PANEL W/O CHOL/HDL RATIO: Triglycerides: 129 mg/dL (ref 0–149)

## 2023-05-11 LAB — COMPREHENSIVE METABOLIC PANEL
Alkaline Phosphatase: 62 IU/L (ref 44–121)
BUN/Creatinine Ratio: 13 (ref 12–28)
Calcium: 8.8 mg/dL (ref 8.7–10.3)
Chloride: 105 mmol/L (ref 96–106)
Creatinine, Ser: 1.71 mg/dL — ABNORMAL HIGH (ref 0.57–1.00)
Glucose: 92 mg/dL (ref 70–99)
Sodium: 139 mmol/L (ref 134–144)
Total Protein: 6.8 g/dL (ref 6.0–8.5)

## 2023-05-11 LAB — CBC WITH DIFFERENTIAL/PLATELET
Hemoglobin: 12.4 g/dL (ref 11.1–15.9)
MCV: 96 fL (ref 79–97)
Neutrophils: 43 %
Platelets: 83 10*3/uL — CL (ref 150–450)
RBC: 4 x10E6/uL (ref 3.77–5.28)
RDW: 13.7 % (ref 11.7–15.4)

## 2023-05-11 LAB — TSH: TSH: 1.21 u[IU]/mL (ref 0.450–4.500)

## 2023-05-11 LAB — LIPID PANEL W/O CHOL/HDL RATIO
Cholesterol, Total: 178 mg/dL (ref 100–199)
HDL: 54 mg/dL (ref >39)
LDL Chol Calc (NIH): 101 mg/dL — ABNORMAL HIGH (ref 0–99)
VLDL Cholesterol Cal: 23 mg/dL (ref 5–40)

## 2023-05-11 LAB — VITAMIN D 25 HYDROXY (VIT D DEFICIENCY, FRACTURES): Vit D, 25-Hydroxy: 37.3 ng/mL (ref 30.0–100.0)

## 2023-05-11 LAB — PTH, INTACT AND CALCIUM: PTH: 43 pg/mL (ref 15–65)

## 2023-05-11 LAB — PROLACTIN: Prolactin: 245 ng/mL — ABNORMAL HIGH (ref 3.6–25.2)

## 2023-05-11 NOTE — Progress Notes (Signed)
Good morning, please let Baxley know her labs have returned (she does not check MyChart): - CBC continues to show low platelet count.  She has not see oncology/hematology provider since May 2023 and needs to schedule follow-up with them ASAP + not miss a visit.  This is important for overall health and disease monitoring.  She needs to call [phone] to schedule with them and if transportation an issue let us know as we can assist with this. - Kidney function continues to show Stage 4 kidney disease, she has not see nephrology since 04/10/22 and needs a follow-up ASAP.  Please call [phone] and again alert [place] if need help getting there. - Cholesterol labs show LDL a bit above goal, please ensure you are taking Atorvastatin daily. - Prolactin level remains a bit elevated, but trending down. This can be related to Risperdal and for this we will monitor at this time, since this medication has been beneficial to her for years. - Remainder of labs stable.  Any questions? Keep being amazing!!  Thank you for allowing me to participate in your care.  I appreciate you. Kindest regards, Kenlee Vogt

## 2023-05-25 DIAGNOSIS — Z1211 Encounter for screening for malignant neoplasm of colon: Secondary | ICD-10-CM | POA: Diagnosis not present

## 2023-05-27 LAB — FECAL OCCULT BLOOD, IMMUNOCHEMICAL: Fecal Occult Bld: NEGATIVE

## 2023-07-03 ENCOUNTER — Other Ambulatory Visit: Payer: Self-pay

## 2023-07-03 DIAGNOSIS — D693 Immune thrombocytopenic purpura: Secondary | ICD-10-CM

## 2023-07-03 DIAGNOSIS — D472 Monoclonal gammopathy: Secondary | ICD-10-CM

## 2023-07-06 ENCOUNTER — Inpatient Hospital Stay: Payer: 59 | Attending: Oncology | Admitting: Oncology

## 2023-07-06 ENCOUNTER — Inpatient Hospital Stay: Payer: 59

## 2023-08-03 ENCOUNTER — Other Ambulatory Visit: Payer: Self-pay | Admitting: Nurse Practitioner

## 2023-08-04 NOTE — Telephone Encounter (Signed)
Requested medication (s) are due for refill today: Yes  Requested medication (s) are on the active medication list: Yes  Last refill:  03/03/23  Future visit scheduled: Yes  Notes to clinic:  Not delegated.    Requested Prescriptions  Pending Prescriptions Disp Refills   tiZANidine (ZANAFLEX) 4 MG tablet [Pharmacy Med Name: TIZANIDINE HCL 4 MG TAB] 60 tablet 4    Sig: TAKE 1 TABLET BY MOUTH EVERY 6 HOURS AS NEEDED FOR MUSCLE SPASMS     Not Delegated - Cardiovascular:  Alpha-2 Agonists - tizanidine Failed - 08/03/2023 12:13 PM      Failed - This refill cannot be delegated      Passed - Valid encounter within last 6 months    Recent Outpatient Visits           2 months ago Centrilobular emphysema (HCC)   [place] [facility] [place], Corrie Dandy T, NP   8 months ago Subacute cough   [place] [facility] [place], Sutton T, NP   9 months ago Chronic low back pain (Location of Primary Source of Pain) (Bilateral) (R>L)   [place] [facility] [place], Corrie Dandy T, NP   11 months ago Schizophrenia, unspecified type [facility])   [place] [facility] [place], Corrie Dandy T, NP   1 year ago Chronic pain syndrome   [place] [facility] Gabriel Cirri, NP       Future Appointments             In 3 months Cann[place], Dorie Rank, NP [facility]

## 2023-08-19 ENCOUNTER — Ambulatory Visit (INDEPENDENT_AMBULATORY_CARE_PROVIDER_SITE_OTHER): Payer: 59 | Admitting: Pediatrics

## 2023-08-19 ENCOUNTER — Encounter: Payer: Self-pay | Admitting: Pediatrics

## 2023-08-19 VITALS — BP 128/82 | HR 76 | Wt 135.6 lb

## 2023-08-19 DIAGNOSIS — M545 Low back pain, unspecified: Secondary | ICD-10-CM

## 2023-08-19 DIAGNOSIS — Z1330 Encounter for screening examination for mental health and behavioral disorders, unspecified: Secondary | ICD-10-CM | POA: Diagnosis not present

## 2023-08-19 MED ORDER — CELECOXIB 100 MG PO CAPS: 100.0000 mg | ORAL_CAPSULE | Freq: Two times a day (BID) | ORAL | 0 refills | Status: AC

## 2023-08-19 NOTE — Patient Instructions (Signed)
Take celbrex 100mg  twice daily for 7 days Use voltaren gel Start a using a heating pad

## 2023-08-19 NOTE — Progress Notes (Signed)
Acute Visit  BP 128/82   Pulse 76   Wt 135 lb 9.6 oz (61.5 kg)   LMP  (LMP Unknown)   SpO2 98%   BMI 26.93 kg/m    Subjective:    Patient ID: Denise Everett, female    DOB: 1951-10-11, 72 y.o.   MRN: [id number]  HPI: Denise Everett is a 72 y.o. female  Chief Complaint  Patient presents with   Back Pain    Patient says she has having pain in her lower back pain on the R side near the kidney. Patient says she has been taking the prescribed Zanaflex and says the prescription isn't helping. Patient says the pain is constant. Patient says she noticed the pain more when she gets up and move around, but no pain sitting or laying down.    Started Friday, no trauma  Feels like sharp pain up and down, right side. Shooting up and down No changes to bladder and bowel function Improved when she leans forward Zanaflex has not helped Taking prn voltaren, ibuprofen and tylenol Does not waken her from sleep  Relevant past medical, surgical, family and social history reviewed and updated as indicated. Interim medical history since our last visit reviewed. Allergies and medications reviewed and updated.  ROS per HPI unless specifically indicated above     Objective:    BP 128/82   Pulse 76   Wt 135 lb 9.6 oz (61.5 kg)   LMP  (LMP Unknown)   SpO2 98%   BMI 26.93 kg/m   Wt Readings from Last 3 Encounters:  08/19/23 135 lb 9.6 oz (61.5 kg)  05/08/23 133 lb 6.4 oz (60.5 kg)  01/05/23 132 lb (59.9 kg)     Physical Exam Constitutional:      Appearance: Normal appearance.  Pulmonary:     Effort: Pulmonary effort is normal.  Musculoskeletal:        General: Normal range of motion.     Cervical back: Normal.     Thoracic back: Normal.     Lumbar back: Tenderness present. Negative right straight leg raise test and negative left straight leg raise test.     Comments: Tender paraspinal muscles on right  Skin:    Comments: Normal skin color  Neurological:     General: No focal  deficit present.     Mental Status: She is alert. Mental status is at baseline.  Psychiatric:        Mood and Affect: Mood normal.        Behavior: Behavior normal.        Thought Content: Thought content normal.         08/19/2023    1:17 PM 05/08/2023    1:52 PM 01/05/2023    3:30 PM 11/21/2022    3:26 PM 10/13/2022    1:18 PM  Depression screen PHQ 2/9  Decreased Interest 0 0 0 0 0  Down, Depressed, Hopeless 0 0 0 0 0  PHQ - 2 Score 0 0 0 0 0  Altered sleeping 0 0 0 0 0  Tired, decreased energy 2 0 0 0 0  Change in appetite 0 0 0 0 0  Feeling bad or failure about yourself  0 0 0 0 0  Trouble concentrating 2 0 0 0 0  Moving slowly or fidgety/restless 2 0 0 0 0  Suicidal thoughts 0 0 0 0 0  PHQ-9 Score 6 0 0 0 0  Difficult doing work/chores  Not  difficult at all Not difficult at all Not difficult at all Not difficult at all       08/19/2023    1:17 PM 05/08/2023    1:53 PM 11/21/2022    3:26 PM 10/13/2022    1:18 PM  GAD 7 : Generalized Anxiety Score  Nervous, Anxious, on Edge 0 0 0 0  Control/stop worrying 0 0 0 0  Worry too much - different things 0 0 0 0  Trouble relaxing 0 0 0 0  Restless 0 0 0 0  Easily annoyed or irritable 0 0 0 0  Afraid - awful might happen 0 0 0 0  Total GAD 7 Score 0 0 0 0  Anxiety Difficulty  Not difficult at all Not difficult at all Not difficult at all      Assessment & Plan:  Assessment & Plan   Acute right-sided low back pain without sciatica Pt w acute back pain. Plan to start 7 day course of celebrex, continue voltaren and heat packs at home. Overall exam reassuring but given relief with leaning forward, consider MRI to r/o lumbar stenosis if not better in 1 week. Pt given strict return precautions and anticipatory guidance for ED. -     Celecoxib; Take 1 capsule (100 mg total) by mouth 2 (two) times daily for 7 days.  Dispense: 14 capsule; Refill: 0  Encounter for behavioral health screening As part of their intake evaluation, the  patient was screened for depression, anxiety.  PHQ9 SCORE 6, GAD7 SCORE 0. Screening results negative for tested conditions.   Follow up plan: Return in about 1 week (around 08/26/2023) for back pain.  Tyke Outman Howell Pringle, MD

## 2023-08-27 ENCOUNTER — Encounter: Payer: Self-pay | Admitting: Pediatrics

## 2023-08-27 ENCOUNTER — Ambulatory Visit: Payer: 59 | Admitting: Pediatrics

## 2023-08-27 VITALS — BP 122/79 | HR 79 | Temp 98.3°F | Wt 134.2 lb

## 2023-08-27 DIAGNOSIS — M545 Low back pain, unspecified: Secondary | ICD-10-CM

## 2023-08-27 NOTE — Progress Notes (Signed)
Office Visit  BP 122/79   Pulse 79   Temp 98.3 F (36.8 C) (Oral)   Wt 134 lb 3.2 oz (60.9 kg)   LMP  (LMP Unknown)   SpO2 99%   BMI 26.65 kg/m    Subjective:    Patient ID: Denise Everett, female    DOB: 04-25-1951, 72 y.o.   MRN: [id number]  HPI: Denise Everett is a 72 y.o. female  Chief Complaint  Patient presents with   Back Pain    Pt states back is doing much better now no pain just a nagging feeling    #Back pain Improved significantly from last week Walk better now, no pain Not needing to lean forward for pain relief She denies any numbness, tingling, or changes to bladder or bowel function  Relevant past medical, surgical, family and social history reviewed and updated as indicated. Interim medical history since our last visit reviewed. Allergies and medications reviewed and updated.  ROS per HPI unless specifically indicated above     Objective:    BP 122/79   Pulse 79   Temp 98.3 F (36.8 C) (Oral)   Wt 134 lb 3.2 oz (60.9 kg)   LMP  (LMP Unknown)   SpO2 99%   BMI 26.65 kg/m   Wt Readings from Last 3 Encounters:  08/27/23 134 lb 3.2 oz (60.9 kg)  08/19/23 135 lb 9.6 oz (61.5 kg)  05/08/23 133 lb 6.4 oz (60.5 kg)     Physical Exam Constitutional:      Appearance: Normal appearance.  Pulmonary:     Effort: Pulmonary effort is normal.  Musculoskeletal:        General: Normal range of motion.     Thoracic back: Normal.     Lumbar back: Normal. No tenderness. Negative right straight leg raise test and negative left straight leg raise test.  Skin:    Comments: Normal skin color  Neurological:     General: No focal deficit present.     Mental Status: She is alert. Mental status is at baseline.  Psychiatric:        Mood and Affect: Mood normal.        Behavior: Behavior normal.        Thought Content: Thought content normal.        Assessment & Plan:  Assessment & Plan   Acute right-sided low back pain without sciatica Pained  resolved with conservative management with stretches and celebrex. Has switched to tylenol prn. Well appearing on exam. Return if symptoms recur.   Follow up plan: Return if symptoms worsen or fail to improve.  Kerie Badger Howell Pringle, MD

## 2023-08-27 NOTE — Patient Instructions (Signed)
Good to see you, please let us know if pain worsens or returns. Otherwise, Denise Everett will see you in December!

## 2023-08-31 ENCOUNTER — Other Ambulatory Visit: Payer: Self-pay | Admitting: Nurse Practitioner

## 2023-08-31 ENCOUNTER — Telehealth: Payer: Self-pay | Admitting: Nurse Practitioner
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
## 2023-09-01 NOTE — Telephone Encounter (Signed)
Requested medication (s) are due for refill today: yes   Requested medication (s) are on the active medication list: yes   Last refill:  12/02/22 #180 2 refills   Future visit scheduled: yes in 2  months   Notes to clinic:  not delegated per protocol. Do you want to refill Rx?     Requested Prescriptions  Pending Prescriptions Disp Refills   pregabalin (LYRICA) 25 MG capsule [Pharmacy Med Name: PREGABALIN 25 MG CAP] 180 capsule     Sig: TAKE ONE CAPSULE BY MOUTH TWICE A DAY     Not Delegated - Neurology:  Anticonvulsants - Controlled - pregabalin Failed - 08/31/2023  9:53 AM      Failed - This refill cannot be delegated      Failed - Cr in normal range and within 360 days    Creatinine  Date Value Ref Range Status  06/23/2014 1.57 (H) 0.60 - 1.30 mg/dL Final   Creatinine, Ser  Date Value Ref Range Status  05/08/2023 1.71 (H) 0.57 - 1.00 mg/dL Final         Passed - Completed PHQ-2 or PHQ-9 in the last 360 days      Passed - Valid encounter within last 12 months    Recent Outpatient Visits           5 days ago Acute right-sided low back pain without sciatica   [facility] Jackolyn Confer, MD   1 week ago Acute right-sided low back pain without sciatica   [facility] Jackolyn Confer, MD   3 months ago Centrilobular emphysema [facility])   [facility] [place], Dorie Rank, NP   9 months ago Subacute cough   [facility], [place] T, NP   10 months ago Chronic low back pain (Location of Primary Source of Pain) (Bilateral) (R>L)    [facility] Marjie Skiff, NP       Future Appointments             In 2 months [place], Dorie Rank, NP  [facility]

## 2023-09-03 ENCOUNTER — Telehealth: Payer: Self-pay | Admitting: Nurse Practitioner

## 2023-09-03 NOTE — Telephone Encounter (Signed)
Left message for patient to call the office and schedule an ov after her eye exam to have provider complete paperwork.

## 2023-09-03 NOTE — Telephone Encounter (Signed)
Copied from CRM [id number]. Topic: General - Inquiry >> Sep 03, 2023 10:04 AM Patsy Lager T wrote: Reason for CRM: patient called wanting to know if provider would like for her to come in after she see her eye doctor in January to get the forms completed that states whether she can keep her drivers license or not. Please f/u with patient

## 2023-10-07 ENCOUNTER — Ambulatory Visit: Payer: 59 | Admitting: Nurse Practitioner

## 2023-10-07 ENCOUNTER — Telehealth: Payer: Self-pay | Admitting: Nurse Practitioner

## 2023-10-07 DIAGNOSIS — D472 Monoclonal gammopathy: Secondary | ICD-10-CM

## 2023-10-07 DIAGNOSIS — E7800 Pure hypercholesterolemia, unspecified: Secondary | ICD-10-CM

## 2023-10-07 DIAGNOSIS — F209 Schizophrenia, unspecified: Secondary | ICD-10-CM

## 2023-10-07 DIAGNOSIS — R7989 Other specified abnormal findings of blood chemistry: Secondary | ICD-10-CM

## 2023-10-07 DIAGNOSIS — Z23 Encounter for immunization: Secondary | ICD-10-CM

## 2023-10-07 DIAGNOSIS — N184 Chronic kidney disease, stage 4 (severe): Secondary | ICD-10-CM

## 2023-10-07 DIAGNOSIS — D693 Immune thrombocytopenic purpura: Secondary | ICD-10-CM

## 2023-10-07 DIAGNOSIS — M810 Age-related osteoporosis without current pathological fracture: Secondary | ICD-10-CM

## 2023-10-07 DIAGNOSIS — J432 Centrilobular emphysema: Secondary | ICD-10-CM

## 2023-10-07 DIAGNOSIS — E559 Vitamin D deficiency, unspecified: Secondary | ICD-10-CM

## 2023-10-07 DIAGNOSIS — I10 Essential (primary) hypertension: Secondary | ICD-10-CM

## 2023-10-07 NOTE — Telephone Encounter (Signed)
Patient dropped off document  [employer] [employer] Form , to be filled out by provider. Patient requested to send it back via Call Patient to pick up within 5-days. Document is located in providers tray at front office.Please advise at Mobile [phone] (mobile)

## 2023-10-07 NOTE — Progress Notes (Signed)
Pharmacy Quality Measure Review  This patient is appearing on a report for being at risk of failing the adherence measure for cholesterol (statin) medications this calendar year.   Medication: atorvastatin 10mg  Last fill date: 06/29/23 for 90 day supply  Attempted to contact patient, but mobile voicemail is full, and home number is not correct.    Lenna Gilford, PharmD, DPLA

## 2023-10-12 NOTE — Telephone Encounter (Signed)
Patient has upcoming scheduled appointment for 10/19/2023. Unable to reach patient or leave voicemail as it is full. If patient calls back please advise of her need to keep scheduled visit and confirm her forms can still be accepted if provider does complete after her upcoming visit.   Ok for [facility] to review.

## 2023-10-17 NOTE — Patient Instructions (Signed)
 Food Basics for Chronic Kidney Disease Chronic kidney disease (CKD) is when your kidneys are not working well. They cannot remove waste, fluids, and other substances from your blood the way they should. These substances can build up, which can worsen kidney damage and affect how your body works. Eating certain foods can lead to a buildup of these substances. Changing your diet can help prevent more kidney damage. Diet changes may also delay dialysis or even keep you from needing it. What nutrients should I limit? Work with your treatment team and a food expert (dietitian) to make a meal plan that's right for you. Foods you can eat and foods you should limit or avoid will depend on the stage of your kidney disease and any other health conditions you have. The items listed below are not a complete list. Talk with your dietitian to learn what is best for you. Potassium Potassium affects how steadily your heart beats. Too much potassium in your blood can cause an irregular heartbeat or even a heart attack. You may need to limit foods that are high in potassium, such as: Liquid milk and soy milk. Salt substitutes that contain potassium. Fruits like bananas, apricots, nectarines, melon, prunes, raisins, kiwi, and oranges. Vegetables, such as potatoes, sweet potatoes, yams, tomatoes, leafy greens, beets, avocado, pumpkin, and winter squash. Beans, like lima beans. Nuts. Phosphorus Phosphorus is a mineral found in your bones. You need a balance between calcium and phosphorus to build and maintain healthy bones. Too much added phosphorus from the foods you eat can pull calcium from your bones. Losing calcium can make your bones weak and more likely to break. Too much phosphorus can also make your skin itch. You may need to limit foods that are high in phosphorus or that have added phosphorus, such as: Liquid milk and dairy products. Dark-colored sodas or soft drinks. Bran cereals and  oatmeal. Protein  Protein helps you make and keep muscle. Protein also helps to repair your body's cells and tissues. One of the natural breakdown products of protein is a waste product called urea. When your kidneys are not working well, they cannot remove types of waste like urea. Reducing protein in your diet can help keep urea from building up in your blood. Depending on your stage of kidney disease, you may need to eat smaller portions of foods that are high in protein. Sources of animal protein include: Meat (all types). Fish and seafood. Poultry. Eggs. Dairy. Other protein foods include: Beans and legumes. Nuts and nut butter. Soy, like tofu.  Sodium Salt (sodium) helps to keep a healthy balance of fluids in your body. Too much salt can increase your blood pressure, which can harm your heart and lungs. Extra salt can also cause your body to keep too much fluid, making your kidneys work harder. You may need to limit or avoid foods that are high in salt, such as: Salt seasonings. Soy and teriyaki sauce. Packaged, precooked, cured, or processed meats, such as sausages or meat loaves. Sardines. Salted crackers and snack foods. Fast food. Canned soups and most canned foods. Pickled foods. Vegetable juice. Boxed mixes or ready-to-eat boxed meals and side dishes. Bottled dressings, sauces, and marinades. Talk with your dietitian about how much potassium, phosphorus, protein, and salt you may have each day. Helpful tips Read food labels  Check the amount of salt in foods. Limit foods that have salt or sodium listed among the first five ingredients. Try to eat low-salt foods. Check the ingredient list  for added phosphorus or potassium. "Phos" in an ingredient is a sign that phosphorus has been added. Do not buy foods that are calcium-enriched or that have calcium added to them (are fortified). Buy canned vegetables and beans that say "no salt added" and rinse them before  eating. Lifestyle Limit the amount of protein you eat from animal sources each day. Focus on protein from plant sources, like tofu and dried beans, peas, and lentils. Do not add salt to food when cooking or before eating. Do not eat star fruit. It can be toxic for people with kidney problems. Talk with your health care provider before taking any vitamin or mineral supplements. If told by your health care provider, track how much liquid you drink so you can avoid drinking too much. You may need to include foods you eat that are made mostly from water, like gelatin, ice cream, soups, and juicy fruits and vegetables. If you have diabetes: If you have diabetes (diabetes mellitus) and CKD, you need to keep your blood sugar (glucose) in the target range recommended by your health care provider. Follow your diabetes management plan. This may include: Checking your blood glucose regularly. Taking medicines by mouth, or taking insulin, or both. Exercising for at least 30 minutes on 5 or more days each week, or as told by your health care provider. Tracking how many servings of carbohydrates you eat at each meal. Not using orange juice to treat low blood sugars. Instead, use apple juice, cranberry juice, or clear soda. You may be given guidelines on what foods and nutrients you may eat, and how much you can have each day. This depends on your stage of kidney disease and whether you have high blood pressure (hypertension). Follow the meal plan your dietitian gives you. To learn more: [employer] of Diabetes and Digestive and Kidney Diseases: [employer]: kidney.org Summary Chronic kidney disease (CKD) is when your kidneys are not working well. They cannot remove waste, fluids, and other substances from your blood the way they should. These substances can build up, which can worsen kidney damage and affect how your body works. Changing your diet can help prevent more  kidney damage. Diet changes may also delay dialysis or even keep you from needing it. Diet changes are different for each person with CKD. Work with a dietitian to set up a meal plan that is right for you. This information is not intended to replace advice given to you by your health care provider. Make sure you discuss any questions you have with your health care provider. Document Revised: 02/13/2022 Document Reviewed: 02/20/2020 Elsevier Patient Education  2024 [employer].

## 2023-10-19 ENCOUNTER — Encounter: Payer: Self-pay | Admitting: Nurse Practitioner

## 2023-10-19 ENCOUNTER — Ambulatory Visit (INDEPENDENT_AMBULATORY_CARE_PROVIDER_SITE_OTHER): Payer: 59 | Admitting: Nurse Practitioner

## 2023-10-19 VITALS — BP 111/74 | HR 76 | Temp 98.3°F | Ht 59.5 in | Wt 133.0 lb

## 2023-10-19 DIAGNOSIS — J432 Centrilobular emphysema: Secondary | ICD-10-CM

## 2023-10-19 DIAGNOSIS — F209 Schizophrenia, unspecified: Secondary | ICD-10-CM | POA: Diagnosis not present

## 2023-10-19 DIAGNOSIS — E7800 Pure hypercholesterolemia, unspecified: Secondary | ICD-10-CM

## 2023-10-19 DIAGNOSIS — G894 Chronic pain syndrome: Secondary | ICD-10-CM | POA: Diagnosis not present

## 2023-10-19 DIAGNOSIS — I10 Essential (primary) hypertension: Secondary | ICD-10-CM

## 2023-10-19 DIAGNOSIS — D693 Immune thrombocytopenic purpura: Secondary | ICD-10-CM

## 2023-10-19 DIAGNOSIS — R7989 Other specified abnormal findings of blood chemistry: Secondary | ICD-10-CM | POA: Diagnosis not present

## 2023-10-19 DIAGNOSIS — M810 Age-related osteoporosis without current pathological fracture: Secondary | ICD-10-CM | POA: Diagnosis not present

## 2023-10-19 DIAGNOSIS — Z23 Encounter for immunization: Secondary | ICD-10-CM

## 2023-10-19 DIAGNOSIS — D472 Monoclonal gammopathy: Secondary | ICD-10-CM

## 2023-10-19 DIAGNOSIS — N184 Chronic kidney disease, stage 4 (severe): Secondary | ICD-10-CM

## 2023-10-19 MED ORDER — PREGABALIN 50 MG PO CAPS: 50.0000 mg | ORAL_CAPSULE | Freq: Two times a day (BID) | ORAL | 5 refills | Status: DC

## 2023-10-19 MED ORDER — ATORVASTATIN CALCIUM 10 MG PO TABS: ORAL_TABLET | ORAL | 4 refills | Status: DC

## 2023-10-19 NOTE — Assessment & Plan Note (Signed)
Ongoing.  Noted on CT 02/03/2017.  Is a former smoker, reports quitting >6 years ago.  No current inhalers.  Benefit from spirometry in future, refuses today.  Has repeat lung CT ordered, but has not attended or scheduled this yet -- highly recommend she schedule this.  Is non compliant often with recommendations.  Will continue to work closely with CCM team on assisting with this.

## 2023-10-19 NOTE — Assessment & Plan Note (Signed)
Chronic. Continue to collaborate with hematology, recent note reviewed.  She denies bleeding or increased bruising.  

## 2023-10-19 NOTE — Assessment & Plan Note (Addendum)
Chronic, ongoing.  Continue current medication regimen, Risperdal, and adjust as needed. Has been on this regimen for years with benefit.  Does have underlying paranoia at baseline.  Refills up to date.  Prolactin level today, this has been slightly elevated but has been on regimen for several years with tolerance.

## 2023-10-19 NOTE — Assessment & Plan Note (Signed)
History of low levels, recheck today and adjust supplement as needed. 

## 2023-10-19 NOTE — Assessment & Plan Note (Signed)
Recheck level today, is on Risperdal. Past level mild elevation, however is well controlled on Risperdal and has been on for several years.
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
## 2023-10-19 NOTE — Assessment & Plan Note (Signed)
Chronic, stable.  BP well below goal today. Continue current medication regimen and adjust as needed.  Recommend she check BP at home at least 3 mornings a week and document.  DASH diet focus.  If low readings consider discontinuation or reduction to 2.5 MG of Lisinopril.  LABS: CMP, CBC, Lipid.

## 2023-10-19 NOTE — Assessment & Plan Note (Signed)
Chronic, ongoing.  Continue current medication regimen and adjust as needed. Lipid panel today. 

## 2023-10-19 NOTE — Assessment & Plan Note (Signed)
Chronic, ongoing.  Continue to collaborate with hematology, recent note reviewed.  She denies bleeding or increased bruising.  Have highly recommended not to miss appointments with them due to disease process and ongoing continuity of care.  Will obtain all labs they ordered in August today and recommend she schedule follow-up with them.

## 2023-10-19 NOTE — Assessment & Plan Note (Signed)
Chronic, ongoing.  Continue current medication regimen and collaboration with nephrology.  CCM collaboration continues, will place new referral as new team present.  Highly recommend she schedule follow-up with nephrology, have continued to recommend and she has not attended.  Discussed at length importance of nephrology involvement in CKD 4.  LABS: CBC, CMP.
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
## 2023-10-19 NOTE — Assessment & Plan Note (Signed)
Chronic, ongoing.  Improved with Lyrica on board, continue this regimen with Tylenol as needed.  She wishes to trial increase in dose to help her more throughout day, will increase to 50 MG BID.

## 2023-10-20 NOTE — Progress Notes (Signed)
Good morning crew, please let Cait now her labs are starting to return and I do have concerns: - Her platelets have dropped to 52.  She needs to schedule with hematology ASAP.  We can help her schedule if needed, but she can not miss visit.  This is important to see them regularly to ensure no worsening of disease process. - Her kidney function continues to show Stage 4 disease.  I need her to return to kidney doctor ASAP -- we can help her schedule this too, however she does need to attend visit for this too as we need to monitor this closely with them to ensure it does not reach level where dialysis is needed.  Currently it is not there. - Prolactin level remains a little high, which can we related to your Risperdal but she has been on this for years with benefit and I want her to maintain this and we will monitor. - Waiting on remainder of hematology labs to return and will ensure they get these -- again though she NEEDS to go for visits with them and kidney doctor.  This is VERY important.  Any questions? Keep being stellar!!  Thank you for allowing me to participate in your care.  I appreciate you. Kindest regards, Miken Stecher

## 2023-10-26 ENCOUNTER — Telehealth: Payer: Self-pay

## 2023-10-26 NOTE — Telephone Encounter (Signed)
Called patient to inform her that her DMV medical review paperwork is completed and ready for pickup. She needs to finish her portion but the provider portion is complete. Ready for pickup at the front desk with copies made for scanning to her record.   OK for [facility] to review if patient returns call.

## 2023-10-28 ENCOUNTER — Encounter: Payer: Self-pay | Admitting: Nurse Practitioner

## 2023-10-28 LAB — LIPID PANEL W/O CHOL/HDL RATIO
Cholesterol, Total: 141 mg/dL (ref 100–199)
HDL: 45 mg/dL (ref >39)
LDL Chol Calc (NIH): 76 mg/dL (ref 0–99)
Triglycerides: 109 mg/dL (ref 0–149)
VLDL Cholesterol Cal: 20 mg/dL (ref 5–40)

## 2023-10-28 LAB — KAPPA/LAMBDA LIGHT CHAINS
Ig Kappa Free Light Chain: 76.5 mg/L — ABNORMAL HIGH (ref 3.3–19.4)
Ig Lambda Free Light Chain: 39.9 mg/L — ABNORMAL HIGH (ref 5.7–26.3)
KAPPA/LAMBDA RATIO: 1.92 — ABNORMAL HIGH (ref 0.26–1.65)

## 2023-10-28 LAB — CBC WITH DIFFERENTIAL/PLATELET
Basophils Absolute: 0.1 10*3/uL (ref 0.0–0.2)
Basos: 1 %
EOS (ABSOLUTE): 0.2 10*3/uL (ref 0.0–0.4)
Eos: 4 %
Hematocrit: 38.3 % (ref 34.0–46.6)
Hemoglobin: 12.6 g/dL (ref 11.1–15.9)
Immature Grans (Abs): 0.1 10*3/uL (ref 0.0–0.1)
Immature Granulocytes: 1 %
Lymphocytes Absolute: 2.5 10*3/uL (ref 0.7–3.1)
Lymphs: 46 %
MCH: 31.7 pg (ref 26.6–33.0)
MCHC: 32.9 g/dL (ref 31.5–35.7)
MCV: 97 fL (ref 79–97)
Monocytes Absolute: 0.7 10*3/uL (ref 0.1–0.9)
Monocytes: 13 %
Neutrophils Absolute: 1.9 10*3/uL (ref 1.4–7.0)
Neutrophils: 35 %
Platelets: 52 10*3/uL — CL (ref 150–450)
RBC: 3.97 x10E6/uL (ref 3.77–5.28)
RDW: 12.9 % (ref 11.7–15.4)
WBC: 5.5 10*3/uL (ref 3.4–10.8)

## 2023-10-28 LAB — MULTIPLE MYELOMA PANEL, SERUM
Albumin SerPl Elph-Mcnc: 3.5 g/dL (ref 2.9–4.4)
Albumin/Glob SerPl: 1.3 (ref 0.7–1.7)
Alpha 1: 0.2 g/dL (ref 0.0–0.4)
Alpha2 Glob SerPl Elph-Mcnc: 0.7 g/dL (ref 0.4–1.0)
B-Globulin SerPl Elph-Mcnc: 0.8 g/dL (ref 0.7–1.3)
Gamma Glob SerPl Elph-Mcnc: 1.1 g/dL (ref 0.4–1.8)
Globulin, Total: 2.9 g/dL (ref 2.2–3.9)
IgA/Immunoglobulin A, Serum: 95 mg/dL (ref 64–422)
IgG (Immunoglobin G), Serum: 1240 mg/dL (ref 586–1602)
IgM (Immunoglobulin M), Srm: 90 mg/dL (ref 26–217)
M Protein SerPl Elph-Mcnc: 0.7 g/dL — ABNORMAL HIGH

## 2023-10-28 LAB — COMPREHENSIVE METABOLIC PANEL
ALT: 7 [IU]/L (ref 0–32)
AST: 14 [IU]/L (ref 0–40)
Albumin: 3.8 g/dL (ref 3.8–4.8)
Alkaline Phosphatase: 56 [IU]/L (ref 44–121)
BUN/Creatinine Ratio: 9 — ABNORMAL LOW (ref 12–28)
BUN: 17 mg/dL (ref 8–27)
Bilirubin Total: 0.2 mg/dL (ref 0.0–1.2)
CO2: 16 mmol/L — ABNORMAL LOW (ref 20–29)
Calcium: 9.2 mg/dL (ref 8.7–10.3)
Chloride: 103 mmol/L (ref 96–106)
Creatinine, Ser: 1.97 mg/dL — ABNORMAL HIGH (ref 0.57–1.00)
Globulin, Total: 2.6 g/dL (ref 1.5–4.5)
Glucose: 65 mg/dL — ABNORMAL LOW (ref 70–99)
Potassium: 4 mmol/L (ref 3.5–5.2)
Sodium: 134 mmol/L (ref 134–144)
Total Protein: 6.4 g/dL (ref 6.0–8.5)
eGFR: 26 mL/min/{1.73_m2} — ABNORMAL LOW (ref >59)

## 2023-10-28 LAB — PROLACTIN: Prolactin: 284 ng/mL — ABNORMAL HIGH (ref 3.6–25.2)

## 2023-10-28 LAB — VITAMIN D 25 HYDROXY (VIT D DEFICIENCY, FRACTURES): Vit D, 25-Hydroxy: 42.3 ng/mL (ref 30.0–100.0)

## 2023-10-29 ENCOUNTER — Ambulatory Visit: Payer: Self-pay | Admitting: *Deleted

## 2023-10-29 NOTE — Telephone Encounter (Signed)
Reason for Disposition  [1] Follow-up call to recent contact AND [2] information only call, no triage required  Answer Assessment - Initial Assessment Questions 1. REASON FOR CALL or QUESTION: "What is your reason for calling today?" or "How can I best help you?" or "What question do you have that I can help answer?"     Pt called in regarding her lab results.   (2 letters were sent to her regarding her lab results).    She told me she only checks her mail once a week on Fridays.   I let her know she needed to check her mail more often that we had been trying to contact her.   "I only check it on Fridays since it's the last day for the mailman to come".      I emphasized the importance of her needing appts with the hematologist and her kidney dr ASAP.    I also let her know her message from Babs Bertin, CMS regarding her DMV paperwork being ready for pick up.   I let her know (per information in letter) that we would be glad to assist her in getting appts made with her hematologist and kidney dr. When she comes by to pick up her DMV papers.    "I'll come by there on Monday".   "Y'all can schedule those appts for me is fine".    I emphasized the importance of coming by [facility] and picking up her papers and getting these 2 very important dr appts made.    "I'll come by there on Monday".  Protocols used: Information Only Call - No Triage-A-AH

## 2023-10-29 NOTE — Telephone Encounter (Signed)
  Chief Complaint: Pt returned call and was given the messages from Aura Dials, NP and [employer], CMA.   I read the letters to her again and emphasized the importance of getting appts with these 2 dr (hematology and kidney) ASAP.    She said she would come by the office on Monday to pick up the [facility] papers.   As far as making the appts with the specialists she said,  "Y'all can make those appts for me".   I let her know we would be glad to assist her with making these appts perhaps when she comes in on Monday to pick up her DMV papers.    Symptoms: N/A  Frequency: N/A Pertinent Negatives: Patient denies N/A Disposition: [] ED /[] Urgent Care (no appt availability in office) / [] Appointment(In office/virtual)/ []  [facility] [facility]/ [] Home Care/ [] Refused Recommended Disposition /[] [place] [facility]/ [x]  Follow-up with PCP Additional Notes: Message sent to Aura Dials, NP letting them know she called in and was given her lab results and the need to pick up DMV papers and the importance of getting appts with her hematologist and kidney specialist.

## 2023-11-10 ENCOUNTER — Ambulatory Visit: Payer: 59 | Admitting: Nurse Practitioner

## 2023-12-28 ENCOUNTER — Other Ambulatory Visit: Payer: Self-pay | Admitting: Nurse Practitioner

## 2023-12-29 NOTE — Telephone Encounter (Signed)
Requested medication (s) are due for refill today: Yes  Requested medication (s) are on the active medication list: Yes  Last refill:  08/04/23  Future visit scheduled: Yes  Notes to clinic:  Not delegated.    Requested Prescriptions  Pending Prescriptions Disp Refills   tiZANidine (ZANAFLEX) 4 MG tablet [Pharmacy Med Name: TIZANIDINE HCL 4 MG TAB] 60 tablet 4    Sig: TAKE 1 TABLET BY MOUTH EVERY 6 HOURS AS NEEDED FOR MUSCLE SPASMS     Not Delegated - Cardiovascular:  Alpha-2 Agonists - tizanidine Failed - 12/29/2023 11:49 AM      Failed - This refill cannot be delegated      Passed - Valid encounter within last 6 months    Recent Outpatient Visits           2 months ago Centrilobular emphysema (HCC)   [place] [facility] Lucky, [place] T, NP   4 months ago Acute right-sided low back pain without sciatica   [facility] Jackolyn Confer, MD   4 months ago Acute right-sided low back pain without sciatica   [place] [facility] Jackolyn Confer, MD   7 months ago Centrilobular emphysema [facility])   [place] [facility] [place], Corrie Dandy T, NP   1 year ago Subacute cough   [place] [facility] [place], Dorie Rank, NP       Future Appointments             In 3 months Cannady, Dorie Rank, NP [facility], [facility]

## 2024-01-21 ENCOUNTER — Ambulatory Visit (INDEPENDENT_AMBULATORY_CARE_PROVIDER_SITE_OTHER): Payer: 59 | Admitting: Emergency Medicine

## 2024-01-21 VITALS — Ht 62.0 in | Wt 132.0 lb

## 2024-01-21 DIAGNOSIS — Z0000 Encounter for general adult medical examination without abnormal findings: Secondary | ICD-10-CM | POA: Diagnosis not present

## 2024-01-21 DIAGNOSIS — Z780 Asymptomatic menopausal state: Secondary | ICD-10-CM

## 2024-01-21 DIAGNOSIS — Z1231 Encounter for screening mammogram for malignant neoplasm of breast: Secondary | ICD-10-CM

## 2024-01-21 NOTE — Progress Notes (Signed)
 Subjective:   Denise Everett is a 73 y.o. who presents for a Medicare Wellness preventive visit.  Visit Complete: Virtual I connected with  Denise Everett on 01/21/24 by a audio enabled telemedicine application and verified that I am speaking with the correct person using two identifiers.  Patient Location: Home  Provider Location: Home Office  I discussed the limitations of evaluation and management by telemedicine. The patient expressed understanding and agreed to proceed.  Vital Signs: Because this visit was a virtual/telehealth visit, some criteria may be missing or patient reported. Any vitals not documented were not able to be obtained and vitals that have been documented are patient reported.  VideoDeclined- This patient declined Librarian, academic. Therefore the visit was completed with audio only.  Persons Participating in Visit: Patient.  AWV Questionnaire: No: Patient Medicare AWV questionnaire was not completed prior to this visit.  Cardiac Risk Factors include: advanced age (>27men, >59 women);hypertension;dyslipidemia;sedentary lifestyle     Objective:    Today's Vitals   01/21/24 1345  Weight: 132 lb (59.9 kg)  Height: 5\' 2"  (1.575 m)   Body mass index is 24.14 kg/m.     01/21/2024    2:02 PM 01/05/2023    3:32 PM 04/01/2022   11:21 AM 11/27/2021   12:18 PM 11/19/2020   10:41 AM 08/30/2020    3:30 PM 05/10/2020    3:48 PM  Advanced Directives  Does Patient Have a Medical Advance Directive? No No No No No No No  Would patient like information on creating a medical advance directive? Yes (MAU/Ambulatory/Procedural Areas - Information given) No - Patient declined  No - Patient declined  No - Patient declined No - Patient declined    Current Medications (verified) Outpatient Encounter Medications as of 01/21/2024  Medication Sig   albuterol (VENTOLIN HFA) 108 (90 Base) MCG/ACT inhaler Inhale 2 puffs into the lungs every 6 (six)  hours as needed for wheezing or shortness of breath.   atorvastatin (LIPITOR) 10 MG tablet TAKE 1 TABLET BY MOUTH DAILY AT 6 PM   diclofenac Sodium (VOLTAREN) 1 % GEL APPLY 4 GRAMS TOPICALLY 4 TIMES DAILY   famotidine (PEPCID) 20 MG tablet Take 1 tablet (20 mg total) by mouth daily.   lisinopril (ZESTRIL) 5 MG tablet TAKE 1 TABLET BY MOUTH DAILY   mirtazapine (REMERON) 15 MG tablet Take 0.5 tablets (7.5 mg total) by mouth at bedtime.   pregabalin (LYRICA) 50 MG capsule Take 1 capsule (50 mg total) by mouth 2 (two) times daily.   raloxifene (EVISTA) 60 MG tablet Take 1 tablet (60 mg total) by mouth daily.   risperiDONE (RISPERDAL) 2 MG tablet Take 0.5-1 tablets (1-2 mg total) by mouth at bedtime.   tiZANidine (ZANAFLEX) 4 MG tablet TAKE 1 TABLET BY MOUTH EVERY 6 HOURS AS NEEDED FOR MUSCLE SPASMS   No facility-administered encounter medications on file as of 01/21/2024.    Allergies (verified) No known allergies   History: Past Medical History:  Diagnosis Date   Abnormal weight loss 06/07/2015   Allergy    Cataract    Chronic kidney disease    Chronic pain syndrome    Hyperlipidemia    Hypertension    Leg pain    Osteoporosis    hips   Renal insufficiency    Schizophrenia (HCC)    Thrombocytopenia (HCC)    Tobacco use disorder    Past Surgical History:  Procedure Laterality Date   ankle surgery Right  x's 2   CATARACT EXTRACTION     Family History  Problem Relation Age of Onset   Hyperlipidemia Mother    Hypertension Mother    Other Father        unknown medical history   Social History   Socioeconomic History   Marital status: Divorced    Spouse name: Not on file   Number of children: 0   Years of education: Not on file   Highest education level: Not on file  Occupational History   Occupation: retired  Tobacco Use   Smoking status: Former    Current packs/day: 0.00    Average packs/day: 1 pack/day for 43.0 years (43.0 ttl pk-yrs)    Types: Cigarettes     Start date: 07/21/1974    Quit date: 07/21/2017    Years since quitting: 6.5   Smokeless tobacco: Current    Types: Snuff  Vaping Use   Vaping status: Never Used  Substance and Sexual Activity   Alcohol use: No   Drug use: No   Sexual activity: Yes  Other Topics Concern   Not on file  Social History Narrative   Not on file   Social Drivers of Health   Financial Resource Strain: Low Risk  (01/21/2024)   Overall Financial Resource Strain (CARDIA)    Difficulty of Paying Living Expenses: Not hard at all  Food Insecurity: No Food Insecurity (01/21/2024)   Hunger Vital Sign    Worried About Running Out of Food in the Last Year: Never true    Ran Out of Food in the Last Year: Never true  Transportation Needs: No Transportation Needs (01/21/2024)   PRAPARE - Administrator, Civil Service (Medical): No    Lack of Transportation (Non-Medical): No  Physical Activity: Inactive (01/21/2024)   Exercise Vital Sign    Days of Exercise per Week: 0 days    Minutes of Exercise per Session: 0 min  Stress: No Stress Concern Present (01/21/2024)   [employer] of Occupational Health - Occupational Stress Questionnaire    Feeling of Stress : Not at all  Social Connections: Socially Isolated (01/21/2024)   Social Connection and Isolation Panel [NHANES]    Frequency of Communication with Friends and Family: More than three times a week    Frequency of Social Gatherings with Friends and Family: More than three times a week    Attends Religious Services: Never    Database administrator or Organizations: No    Attends Engineer, structural: Never    Marital Status: Divorced    Tobacco Counseling Ready to quit: No Counseling given: No    Clinical Intake:  Pre-visit preparation completed: Yes  Pain : No/denies pain     BMI - recorded: 24.14 Nutritional Status: BMI of 19-24  Normal Nutritional Risks: None Diabetes: No  How often do you need to have someone help you  when you read instructions, pamphlets, or other written materials from your doctor or pharmacy?: 3 - Sometimes What is the last grade level you completed in school?: 6th  Interpreter Needed?: No  Information entered by :: Tora Kindred, CMA   Activities of Daily Living     01/21/2024    1:50 PM  In your present state of health, do you have any difficulty performing the following activities:  Hearing? 0  Vision? 0  Difficulty concentrating or making decisions? 0  Walking or climbing stairs? 1  Comment uses cane  Dressing or bathing? 1  Comment has aid that helps her  Doing errands, shopping? 1  Comment doesn't drive, [employer] and eating ? N  Using the Toilet? N  In the past six months, have you accidently leaked urine? N  Do you have problems with loss of bowel control? N  Managing your Medications? N  Managing your Finances? N  Housekeeping or managing your Housekeeping? Y  Comment aids helps    Patient Care Team: Marjie Skiff, NP as PCP - General (Nurse Practitioner) Jeralyn Ruths, MD as Consulting Physician (Oncology)  Indicate any recent Medical Services you may have received from other than [facility] providers in the past year (date may be approximate).     Assessment:   This is a routine wellness examination for Denise Everett.  Hearing/Vision screen Hearing Screening - Comments:: Denies hearing loss Vision Screening - Comments:: Needs eye exam, [facility] (appt on 01/28/24)   Goals Addressed             This Visit's Progress    Patient Stated       Get driver's license back       Depression Screen     01/21/2024    1:57 PM 10/19/2023    2:16 PM 10/19/2023    2:00 PM 08/27/2023    1:10 PM 08/19/2023    1:17 PM 05/08/2023    1:52 PM 01/05/2023    3:30 PM  PHQ 2/9 Scores  PHQ - 2 Score 0 0  0 0 0 0  PHQ- 9 Score 3 0  0 6 0 0  Exception Documentation   Patient refusal        Fall Risk     01/21/2024     2:03 PM 08/27/2023    1:10 PM 08/19/2023    1:17 PM 05/08/2023    1:52 PM 01/05/2023    3:32 PM  Fall Risk   Falls in the past year? 0 0 0 0 0  Number falls in past yr: 0 0 0 0 0  Injury with Fall? 0 0 0 0 0  Risk for fall due to : No Fall Risks No Fall Risks No Fall Risks No Fall Risks No Fall Risks  Follow up Falls prevention discussed;Falls evaluation completed Falls evaluation completed Falls evaluation completed Falls evaluation completed Falls prevention discussed;Falls evaluation completed    MEDICARE RISK AT HOME:  Medicare Risk at Home Any stairs in or around the home?: No If so, are there any without handrails?: No Home free of loose throw rugs in walkways, pet beds, electrical cords, etc?: Yes Adequate lighting in your home to reduce risk of falls?: Yes Life alert?: No Use of a cane, walker or w/c?: Yes (cane) Grab bars in the bathroom?: Yes Shower chair or bench in shower?: No Elevated toilet seat or a handicapped toilet?: No  TIMED UP AND GO:  Was the test performed?  No  Cognitive Function: 6CIT completed        01/21/2024    2:05 PM 01/05/2023    3:39 PM 11/27/2021   12:05 PM 11/19/2020   10:47 AM 11/11/2018   11:33 AM  6CIT Screen  What Year? 0 points 0 points 0 points 0 points 0 points  What month? 0 points 0 points 0 points 0 points 0 points  What time? 0 points 0 points 0 points 0 points 0 points  Count back from 20 0 points 0 points 0 points 2 points 0 points  Months in reverse 0 points 0 points 0 points 2 points 2 points  Repeat phrase 4 points 6 points 0 points 10 points 4 points  Total Score 4 points 6 points 0 points 14 points 6 points    Immunizations Immunization History  Administered Date(s) Administered   Fluad Quad(high Dose 65+) 08/16/2019, 08/06/2020, 08/27/2021, 09/01/2022   Influenza, High Dose Seasonal PF 08/08/2016, 09/16/2017, 07/30/2018   Influenza,inj,Quad PF,6+ Mos 01/01/2016   PFIZER(Purple Top)SARS-COV-2 Vaccination 02/16/2020,  03/08/2020   Pneumococcal Conjugate-13 10/16/2014   Pneumococcal Polysaccharide-23 01/01/2016   Td 04/15/1999, 07/26/2009, 05/08/2023    Screening Tests Health Maintenance  Topic Date Due   Zoster Vaccines- Shingrix (1 of 2) Never done   DEXA SCAN  Never done   Lung Cancer Screening  02/03/2018   COVID-19 Vaccine (3 - Pfizer risk series) 04/05/2020   INFLUENZA VACCINE  02/08/2024 (Originally 06/11/2023)   MAMMOGRAM  02/12/2024   COLON CANCER SCREENING ANNUAL FOBT  05/24/2024   Medicare Annual Wellness (AWV)  01/20/2025   DTaP/Tdap/Td (4 - Tdap) 05/07/2033   Pneumonia Vaccine 42+ Years old  Completed   Hepatitis C Screening  Completed   HPV VACCINES  Aged Out   Colonoscopy  Discontinued   Fecal DNA (Cologuard)  Discontinued    Health Maintenance  Health Maintenance Due  Topic Date Due   Zoster Vaccines- Shingrix (1 of 2) Never done   DEXA SCAN  Never done   Lung Cancer Screening  02/03/2018   COVID-19 Vaccine (3 - Pfizer risk series) 04/05/2020   Health Maintenance Items Addressed: Mammogram ordered, DEXA ordered, See Nurse Notes  Additional Screening:  Vision Screening: Recommended annual ophthalmology exams for early detection of glaucoma and other disorders of the eye.  Dental Screening: Recommended annual dental exams for proper oral hygiene  Community Resource Referral / Chronic Care Management: CRR required this visit?  No   CCM required this visit?  No     Plan:     I have personally reviewed and noted the following in the patient's chart:   Medical and social history Use of alcohol, tobacco or illicit drugs  Current medications and supplements including opioid prescriptions. Patient is not currently taking opioid prescriptions. Functional ability and status Nutritional status Physical activity Advanced directives List of other physicians Hospitalizations, surgeries, and ER visits in previous 12 months Vitals Screenings to include cognitive,  depression, and falls Referrals and appointments  In addition, I have reviewed and discussed with patient certain preventive protocols, quality metrics, and best practice recommendations. A written personalized care plan for preventive services as well as general preventive health recommendations were provided to patient.     Tora Kindred, CMA   01/21/2024   After Visit Summary: (Mail) Due to this being a telephonic visit, the after visit summary with patients personalized plan was offered to patient via mail   Notes:  6 CIT Score - 4 Placed order for MMG and DEXA to be done at [facility] Declined flu, shingles and covid vaccines Declined LDCT scan Has routine eye exam scheduled 01/28/24.

## 2024-01-21 NOTE — Patient Instructions (Addendum)
 Ms. Boran , Thank you for taking time to come for your Medicare Wellness Visit. I appreciate your ongoing commitment to your health goals. Please review the following plan we discussed and let me know if I can assist you in the future.   Referrals/Orders/Follow-Ups/Clinician Recommendations: I have placed an order for a mammogram and bone density test. Please call [facility] @ [phone] to schedule at your convenience.  This is a list of the screening recommended for you and due dates:  Health Maintenance  Topic Date Due   Zoster (Shingles) Vaccine (1 of 2) Never done   DEXA scan (bone density measurement)  Never done   Screening for Lung Cancer  02/03/2018   COVID-19 Vaccine (3 - Pfizer risk series) 04/05/2020   Mammogram  02/12/2023   Flu Shot  02/08/2024*   Stool Blood Test  05/24/2024   Medicare Annual Wellness Visit  01/20/2025   DTaP/Tdap/Td vaccine (4 - Tdap) 05/07/2033   Pneumonia Vaccine  Completed   Hepatitis C Screening  Completed   HPV Vaccine  Aged Out   Colon Cancer Screening  Discontinued   Cologuard (Stool DNA test)  Discontinued  *Topic was postponed. The date shown is not the original due date.    Advanced directives: (Provided) Advance directive discussed with you today. I have provided a copy for you to complete at home and have notarized. Once this is complete, please bring a copy in to our office so we can scan it into your chart.   Next Medicare Annual Wellness Visit scheduled for next year: Yes, 02/02/25 @ 1:50pm (phone visit)

## 2024-01-28 DIAGNOSIS — Z961 Presence of intraocular lens: Secondary | ICD-10-CM | POA: Diagnosis not present

## 2024-01-28 DIAGNOSIS — M3501 Sicca syndrome with keratoconjunctivitis: Secondary | ICD-10-CM | POA: Diagnosis not present

## 2024-02-02 DIAGNOSIS — H5213 Myopia, bilateral: Secondary | ICD-10-CM | POA: Diagnosis not present

## 2024-02-04 LAB — FECAL OCCULT BLOOD, IMMUNOCHEMICAL: IFOBT: NEGATIVE

## 2024-02-12 ENCOUNTER — Ambulatory Visit (INDEPENDENT_AMBULATORY_CARE_PROVIDER_SITE_OTHER): Admitting: Family Medicine

## 2024-02-12 ENCOUNTER — Encounter: Payer: Self-pay | Admitting: Family Medicine

## 2024-02-12 ENCOUNTER — Other Ambulatory Visit: Payer: Self-pay | Admitting: Nurse Practitioner

## 2024-02-12 VITALS — BP 108/75 | HR 81 | Ht 61.0 in | Wt 132.0 lb

## 2024-02-12 DIAGNOSIS — M5416 Radiculopathy, lumbar region: Secondary | ICD-10-CM | POA: Diagnosis not present

## 2024-02-12 MED ORDER — CELECOXIB 100 MG PO CAPS
100.0000 mg | ORAL_CAPSULE | Freq: Two times a day (BID) | ORAL | 1 refills | Status: DC
Start: 2024-02-12 — End: 2024-04-08

## 2024-02-12 NOTE — Progress Notes (Signed)
 BP 108/75 (BP Location: Left Arm, Patient Position: Sitting, Cuff Size: Normal)   Pulse 81   Ht 5\' 1"  (1.549 m)   Wt 132 lb (59.9 kg)   LMP  (LMP Unknown)   SpO2 98%   BMI 24.94 kg/m    Subjective:    Patient ID: Denise Everett, female    DOB: Mar 23, 1951, 73 y.o.   MRN: [id number]  HPI: Denise Everett is a 73 y.o. female  Chief Complaint  Patient presents with   Leg Pain    Left Pain. Pain started about 2 weeks ago. The pain has gotten worse. 5/10. OTC meds ineffective. No relief. Worsens with walking. Pain radiates from the knee to the hip.    L LEG PAIN Duration: 10 days Mechanism of injury: unknown, woke up like that Location: L thigh  Onset: sudden Severity: moderate to severe Quality: sharp Frequency: constant, waxing and waning Radiation: just in her thigh Aggravating factors: walking and getting out of bed Alleviating factors: nothing Status: worse Treatments attempted: rest, tylenol, voltaren  Relief with NSAIDs?: none Nighttime pain:  yes Paresthesias / decreased sensation:  yes Bowel / bladder incontinence:  no Fevers:  no Dysuria / urinary frequency:  no  Relevant past medical, surgical, family and social history reviewed and updated as indicated. Interim medical history since our last visit reviewed. Allergies and medications reviewed and updated.  Review of Systems  Constitutional: Negative.   Respiratory: Negative.    Cardiovascular: Negative.   Musculoskeletal:  Positive for myalgias. Negative for arthralgias, back pain, gait problem, joint swelling, neck pain and neck stiffness.  Neurological:  Positive for numbness. Negative for dizziness, tremors, seizures, syncope, facial asymmetry, speech difficulty, weakness, light-headedness and headaches.  Psychiatric/Behavioral: Negative.      Per HPI unless specifically indicated above     Objective:    BP 108/75 (BP Location: Left Arm, Patient Position: Sitting, Cuff Size: Normal)   Pulse 81    Ht 5\' 1"  (1.549 m)   Wt 132 lb (59.9 kg)   LMP  (LMP Unknown)   SpO2 98%   BMI 24.94 kg/m   Wt Readings from Last 3 Encounters:  02/12/24 132 lb (59.9 kg)  01/21/24 132 lb (59.9 kg)  10/19/23 133 lb (60.3 kg)    Physical Exam Vitals and nursing note reviewed.  Constitutional:      General: She is not in acute distress.    Appearance: Normal appearance. She is not ill-appearing, toxic-appearing or diaphoretic.  HENT:     Head: Normocephalic and atraumatic.     Right Ear: External ear normal.     Left Ear: External ear normal.     Nose: Nose normal.     Mouth/Throat:     Mouth: Mucous membranes are moist.     Pharynx: Oropharynx is clear.  Eyes:     General: No scleral icterus.       Right eye: No discharge.        Left eye: No discharge.     Extraocular Movements: Extraocular movements intact.     Conjunctiva/sclera: Conjunctivae normal.     Pupils: Pupils are equal, round, and reactive to light.  Cardiovascular:     Rate and Rhythm: Normal rate and regular rhythm.     Pulses: Normal pulses.     Heart sounds: Normal heart sounds. No murmur heard.    No friction rub. No gallop.  Pulmonary:     Effort: Pulmonary effort is normal. No respiratory distress.  Breath sounds: Normal breath sounds. No stridor. No wheezing, rhonchi or rales.  Chest:     Chest wall: No tenderness.  Musculoskeletal:        General: Normal range of motion.     Cervical back: Normal range of motion and neck supple.  Skin:    General: Skin is warm and dry.     Capillary Refill: Capillary refill takes less than 2 seconds.     Coloration: Skin is not jaundiced or pale.     Findings: No bruising, erythema, lesion or rash.  Neurological:     General: No focal deficit present.     Mental Status: She is alert and oriented to person, place, and time. Mental status is at baseline.  Psychiatric:        Mood and Affect: Mood normal.        Behavior: Behavior normal.        Thought Content: Thought  content normal.        Judgment: Judgment normal.     Results for orders placed or performed in visit on 10/19/23  Comprehensive metabolic panel   Collection Time: 10/19/23  2:22 PM  Result Value Ref Range   Glucose 65 (L) 70 - 99 mg/dL   BUN 17 8 - 27 mg/dL   Creatinine, Ser 4.01 (H) 0.57 - 1.00 mg/dL   eGFR 26 (L) >02 VO/ZDG/6.44   BUN/Creatinine Ratio 9 (L) 12 - 28   Sodium 134 134 - 144 mmol/L   Potassium 4.0 3.5 - 5.2 mmol/L   Chloride 103 96 - 106 mmol/L   CO2 16 (L) 20 - 29 mmol/L   Calcium 9.2 8.7 - 10.3 mg/dL   Total Protein 6.4 6.0 - 8.5 g/dL   Albumin 3.8 3.8 - 4.8 g/dL   Globulin, Total 2.6 1.5 - 4.5 g/dL   Bilirubin Total 0.2 0.0 - 1.2 mg/dL   Alkaline Phosphatase 56 44 - 121 IU/L   AST 14 0 - 40 IU/L   ALT 7 0 - 32 IU/L  CBC with Differential/Platelet   Collection Time: 10/19/23  2:22 PM  Result Value Ref Range   WBC 5.5 3.4 - 10.8 x10E3/uL   RBC 3.97 3.77 - 5.28 x10E6/uL   Hemoglobin 12.6 11.1 - 15.9 g/dL   Hematocrit 03.4 74.2 - 46.6 %   MCV 97 79 - 97 fL   MCH 31.7 26.6 - 33.0 pg   MCHC 32.9 31.5 - 35.7 g/dL   RDW 59.5 63.8 - 75.6 %   Platelets 52 (LL) 150 - 450 x10E3/uL   Neutrophils 35 Not Estab. %   Lymphs 46 Not Estab. %   Monocytes 13 Not Estab. %   Eos 4 Not Estab. %   Basos 1 Not Estab. %   Neutrophils Absolute 1.9 1.4 - 7.0 x10E3/uL   Lymphocytes Absolute 2.5 0.7 - 3.1 x10E3/uL   Monocytes Absolute 0.7 0.1 - 0.9 x10E3/uL   EOS (ABSOLUTE) 0.2 0.0 - 0.4 x10E3/uL   Basophils Absolute 0.1 0.0 - 0.2 x10E3/uL   Immature Granulocytes 1 Not Estab. %   Immature Grans (Abs) 0.1 0.0 - 0.1 x10E3/uL   Hematology Comments: Note:   Lipid Panel w/o Chol/HDL Ratio   Collection Time: 10/19/23  2:22 PM  Result Value Ref Range   Cholesterol, Total 141 100 - 199 mg/dL   Triglycerides 433 0 - 149 mg/dL   HDL 45 >29 mg/dL   VLDL Cholesterol Cal 20 5 - 40 mg/dL   LDL Chol Calc (  NIH) 76 0 - 99 mg/dL  Prolactin   Collection Time: 10/19/23  2:22 PM  Result  Value Ref Range   Prolactin 284.0 (H) 3.6 - 25.2 ng/mL  Kappa/lambda light chains   Collection Time: 10/19/23  2:22 PM  Result Value Ref Range   Ig Kappa Free Light Chain 76.5 (H) 3.3 - 19.4 mg/L   Ig Lambda Free Light Chain 39.9 (H) 5.7 - 26.3 mg/L   KAPPA/LAMBDA RATIO 1.92 (H) 0.26 - 1.65  Multiple Myeloma Panel (SPEP&IFE w/QIG)   Collection Time: 10/19/23  2:22 PM  Result Value Ref Range   IgG (Immunoglobin G), Serum 1,240 586 - 1,602 mg/dL   IgA/Immunoglobulin A, Serum 95 64 - 422 mg/dL   IgM (Immunoglobulin M), Srm 90 26 - 217 mg/dL   Albumin SerPl Elph-Mcnc 3.5 2.9 - 4.4 g/dL   Alpha 1 0.2 0.0 - 0.4 g/dL   Alpha2 Glob SerPl Elph-Mcnc 0.7 0.4 - 1.0 g/dL   B-Globulin SerPl Elph-Mcnc 0.8 0.7 - 1.3 g/dL   Gamma Glob SerPl Elph-Mcnc 1.1 0.4 - 1.8 g/dL   M Protein SerPl Elph-Mcnc 0.7 (H) Not Observed g/dL   Globulin, Total 2.9 2.2 - 3.9 g/dL   Albumin/Glob SerPl 1.3 0.7 - 1.7   IFE 1 Comment (A)    Please Note Comment   VITAMIN D 25 Hydroxy (Vit-D Deficiency, Fractures)   Collection Time: 10/19/23  2:22 PM  Result Value Ref Range   Vit D, 25-Hydroxy 42.3 30.0 - 100.0 ng/mL      Assessment & Plan:   Problem List Items Addressed This Visit   None Visit Diagnoses       Lumbar radiculopathy    -  Primary   Will treat with celebrex and get her into PT. Call with any concerns. Follow up with PCP in 8 weeks as scheduled.   Relevant Orders   Ambulatory referral to Physical Therapy        Follow up plan: Return for As scheduled.

## 2024-02-15 NOTE — Telephone Encounter (Signed)
 Requested Prescriptions  Pending Prescriptions Disp Refills   mirtazapine (REMERON) 15 MG tablet [Pharmacy Med Name: MIRTAZAPINE 15 MG TAB] 45 tablet 0    Sig: TAKE 1/2 TABLET (7.5 MG TOTAL) BY MOUTH AT BEDTIME     Psychiatry: Antidepressants - mirtazapine Passed - 02/15/2024  8:20 AM      Passed - Valid encounter within last 6 months    Recent Outpatient Visits           3 days ago Lumbar radiculopathy   [facility] [place], Oralia Rud, DO       Future Appointments             In 2 months Cannady, Dorie Rank, NP [facility]

## 2024-02-16 ENCOUNTER — Encounter: Payer: Self-pay | Admitting: Nurse Practitioner

## 2024-03-15 DIAGNOSIS — H524 Presbyopia: Secondary | ICD-10-CM | POA: Diagnosis not present

## 2024-03-20 NOTE — Patient Instructions (Incomplete)
 Acute Back Pain, Adult Acute back pain is sudden and usually short-lived. It is often caused by an injury to the muscles and tissues in the back. The injury may result from: A muscle, tendon, or ligament getting overstretched or torn. Ligaments are tissues that connect bones to each other. Lifting something improperly can cause a back strain. Wear and tear (degeneration) of the spinal disks. Spinal disks are circular tissue that provide cushioning between the bones of the spine (vertebrae). Twisting motions, such as while playing sports or doing yard work. A hit to the back. Arthritis. You may have a physical exam, lab tests, and imaging tests to find the cause of your pain. Acute back pain usually goes away with rest and home care. Follow these instructions at home: Managing pain, stiffness, and swelling Take over-the-counter and prescription medicines only as told by your health care provider. Treatment may include medicines for pain and inflammation that are taken by mouth or applied to the skin, or muscle relaxants. Your health care provider may recommend applying ice during the first 24-48 hours after your pain starts. To do this: Put ice in a plastic bag. Place a towel between your skin and the bag. Leave the ice on for 20 minutes, 2-3 times a day. Remove the ice if your skin turns bright red. This is very important. If you cannot feel pain, heat, or cold, you have a greater risk of damage to the area. If directed, apply heat to the affected area as often as told by your health care provider. Use the heat source that your health care provider recommends, such as a moist heat pack or a heating pad. Place a towel between your skin and the heat source. Leave the heat on for 20-30 minutes. Remove the heat if your skin turns bright red. This is especially important if you are unable to feel pain, heat, or cold. You have a greater risk of getting burned. Activity  Do not stay in bed. Staying in  bed for more than 1-2 days can delay your recovery. Sit up and stand up straight. Avoid leaning forward when you sit or hunching over when you stand. If you work at a desk, sit close to it so you do not need to lean over. Keep your chin tucked in. Keep your neck drawn back, and keep your elbows bent at a 90-degree angle (right angle). Sit high and close to the steering wheel when you drive. Add lower back (lumbar) support to your car seat, if needed. Take short walks on even surfaces as soon as you are able. Try to increase the length of time you walk each day. Do not sit, drive, or stand in one place for more than 30 minutes at a time. Sitting or standing for long periods of time can put stress on your back. Do not drive or use heavy machinery while taking prescription pain medicine. Use proper lifting techniques. When you bend and lift, use positions that put less stress on your back: [place] your knees. Keep the load close to your body. Avoid twisting. Exercise regularly as told by your health care provider. Exercising helps your back heal faster and helps prevent back injuries by keeping muscles strong and flexible. Work with a physical therapist to make a safe exercise program, as recommended by your health care provider. Do any exercises as told by your physical therapist. Lifestyle Maintain a healthy weight. Extra weight puts stress on your back and makes it difficult to have good  posture. Avoid activities or situations that make you feel anxious or stressed. Stress and anxiety increase muscle tension and can make back pain worse. Learn ways to manage anxiety and stress, such as through exercise. General instructions Sleep on a firm mattress in a comfortable position. Try lying on your side with your knees slightly bent. If you lie on your back, put a pillow under your knees. Keep your head and neck in a straight line with your spine (neutral position) when using electronic equipment like  smartphones or pads. To do this: Raise your smartphone or pad to look at it instead of bending your head or neck to look down. Put the smartphone or pad at the level of your face while looking at the screen. Follow your treatment plan as told by your health care provider. This may include: Cognitive or behavioral therapy. Acupuncture or massage therapy. Meditation or yoga. Contact a health care provider if: You have pain that is not relieved with rest or medicine. You have increasing pain going down into your legs or buttocks. Your pain does not improve after 2 weeks. You have pain at night. You lose weight without trying. You have a fever or chills. You develop nausea or vomiting. You develop abdominal pain. Get help right away if: You develop new bowel or bladder control problems. You have unusual weakness or numbness in your arms or legs. You feel faint. These symptoms may represent a serious problem that is an emergency. Do not wait to see if the symptoms will go away. Get medical help right away. Call your local emergency services (911 in the [place] Do not drive yourself to the hospital. Summary Acute back pain is sudden and usually short-lived. Use proper lifting techniques. When you bend and lift, use positions that put less stress on your back. Take over-the-counter and prescription medicines only as told by your health care provider, and apply heat or ice as told. This information is not intended to replace advice given to you by your health care provider. Make sure you discuss any questions you have with your health care provider. Document Revised: 01/18/2021 Document Reviewed: 01/18/2021 Elsevier Patient Education  2024 [employer].

## 2024-03-25 ENCOUNTER — Ambulatory Visit: Admitting: Nurse Practitioner

## 2024-04-08 ENCOUNTER — Ambulatory Visit (INDEPENDENT_AMBULATORY_CARE_PROVIDER_SITE_OTHER): Admitting: Nurse Practitioner

## 2024-04-08 ENCOUNTER — Encounter: Payer: Self-pay | Admitting: Nurse Practitioner

## 2024-04-08 VITALS — BP 100/64 | HR 80 | Temp 98.3°F | Ht 60.0 in | Wt 133.0 lb

## 2024-04-08 DIAGNOSIS — D693 Immune thrombocytopenic purpura: Secondary | ICD-10-CM

## 2024-04-08 DIAGNOSIS — I700 Atherosclerosis of aorta: Secondary | ICD-10-CM | POA: Diagnosis not present

## 2024-04-08 DIAGNOSIS — I10 Essential (primary) hypertension: Secondary | ICD-10-CM | POA: Diagnosis not present

## 2024-04-08 DIAGNOSIS — E7800 Pure hypercholesterolemia, unspecified: Secondary | ICD-10-CM

## 2024-04-08 DIAGNOSIS — G894 Chronic pain syndrome: Secondary | ICD-10-CM

## 2024-04-08 DIAGNOSIS — J432 Centrilobular emphysema: Secondary | ICD-10-CM

## 2024-04-08 DIAGNOSIS — R7989 Other specified abnormal findings of blood chemistry: Secondary | ICD-10-CM | POA: Diagnosis not present

## 2024-04-08 DIAGNOSIS — D472 Monoclonal gammopathy: Secondary | ICD-10-CM

## 2024-04-08 DIAGNOSIS — F209 Schizophrenia, unspecified: Secondary | ICD-10-CM

## 2024-04-08 DIAGNOSIS — M810 Age-related osteoporosis without current pathological fracture: Secondary | ICD-10-CM

## 2024-04-08 DIAGNOSIS — M48062 Spinal stenosis, lumbar region with neurogenic claudication: Secondary | ICD-10-CM

## 2024-04-08 DIAGNOSIS — N184 Chronic kidney disease, stage 4 (severe): Secondary | ICD-10-CM

## 2024-04-08 LAB — MICROALBUMIN, URINE WAIVED
Creatinine, Urine Waived: 200 mg/dL (ref 10–300)
Microalb, Ur Waived: 80 mg/L — ABNORMAL HIGH (ref 0–19)

## 2024-04-08 MED ORDER — DICLOFENAC SODIUM 1 % EX GEL: 2.0000 g | Freq: Three times a day (TID) | CUTANEOUS | 4 refills | Status: AC | PRN

## 2024-04-08 MED ORDER — METHYLPREDNISOLONE 4 MG PO TBPK: ORAL_TABLET | ORAL | 0 refills | Status: DC

## 2024-04-08 MED ORDER — PREGABALIN 75 MG PO CAPS: 75.0000 mg | ORAL_CAPSULE | Freq: Two times a day (BID) | ORAL | 5 refills | Status: DC

## 2024-04-08 NOTE — Assessment & Plan Note (Signed)
 Ongoing.  Noted on CT 02/03/2017.  Is a former smoker, reports quitting >7 years ago.  No current inhalers.  Benefit from spirometry in future, refuses today.  Had repeat lung CT ordered, but has not attended or scheduled this -- highly recommend she schedule this.  Is non compliant often with recommendations.  Will continue to work closely with [facility] team on assisting with this.

## 2024-04-08 NOTE — Assessment & Plan Note (Signed)
 Chronic, ongoing.  Continue current medication regimen and collaboration with nephrology.  [facility] collaboration continues, will place new referral as new team present.  Highly recommend she schedule follow-up with nephrology, have continued to recommend and she has not attended.  Discussed at length importance of nephrology involvement in CKD 4.  LABS: CBC, CMP, TSH, urine ALB.

## 2024-04-08 NOTE — Assessment & Plan Note (Signed)
 Chronic, stable.  BP well below goal today. Continue current medication regimen and adjust as needed.  Recommend she check BP at home at least 3 mornings a week and document.  DASH diet focus.  If low readings consider discontinuation or reduction to 2.5 MG of Lisinopril .  LABS: CMP, CBC, Lipid, urine ALB.

## 2024-04-08 NOTE — Patient Instructions (Signed)
 Chronic Kidney Disease: Eating Plan Chronic kidney disease (CKD) is when your kidneys aren't working well. They can't remove waste, fluids, and other substances from your blood. When these substances build up, they can worsen kidney damage and affect your health. Eating certain foods can lead to a buildup of these substances. Changing your diet can help prevent more kidney damage. Diet changes may also delay dialysis or even keep you from needing it. What nutrients should I limit? Work with your health care team and an expert in healthy eating (dietitian) to make a meal plan that's right for you. Foods you can eat and foods you should limit or avoid will depend on: The stage of your kidney disease. Any other conditions you have. The items listed below are not a complete list. Talk with a dietitian to learn what's best for you. Potassium Potassium affects how well your heart beats. Too much potassium in your blood can cause an irregular heartbeat or even a heart attack. You may need to limit foods that are high in potassium, such as: Liquid milk and soy milk. Salt substitutes that contain potassium. Fruits like: Bananas. Apricots. Melon. Prunes and raisins. Kiwi. Nectarines and oranges. Vegetables, such as: Potatoes, sweet potatoes, and yams. Tomatoes. Leafy greens. Beets. Avocado. Pumpkin and winter squash. Beans, like lima beans. Nuts. Phosphorus Phosphorus is a mineral found in your bones. You need a balance between calcium and phosphorus to build and maintain healthy bones.  Too much added phosphorus from the foods you eat can pull calcium from your bones. Losing calcium can make your bones weak and more likely to break. Too much phosphorus can also make your skin itch. You may need to limit foods that are high in phosphorus or that have added phosphorus, such as: Liquid milk and dairy products. Dark-colored sodas or soft drinks. Bran cereals and oatmeal. Protein  Protein  helps your body make and keep muscle. Protein also helps to repair your body's cells and tissues.  One of the natural breakdown products of protein is a waste product called urea. When your kidneys aren't working well, they can't remove waste like urea. Reducing protein in your diet can help keep urea from building up in your blood. Depending on your stage of kidney disease, you may need to eat smaller portions of foods that are high in protein. Sources of animal protein include: Meat (all types). Fish and seafood. Poultry. Eggs. Dairy. Other protein foods include: Beans and legumes. Nuts and nut butter. Soy, like tofu.  Sodium Salt (sodium) helps to keep a healthy balance of fluids in your body. Too much salt can increase your blood pressure, which can harm your heart and lungs. Extra salt can also cause your body to keep too much fluid, making your kidneys work harder. You may need to limit or avoid foods that are high in salt, such as: Salt seasonings. Soy and teriyaki sauce. Meats that are: Packaged. Precooked. Cured. Processed. Salted crackers and snack foods. Fast food. Canned soups and foods. Pickled foods. Boxed mixes or ready-to-eat boxed meals and side dishes. Bottled dressings, sauces, and marinades. Talk with your dietitian about how much potassium, phosphorus, protein, and salt you may have each day. What are tips for following this plan? Reading food labels  Check the amount of salt in foods. Limit foods that have salt listed among the first five ingredients. Try to eat low-salt foods. Check the ingredient list for added phosphorus or potassium. "Phos" in an ingredient is a sign that  phosphorus has been added. Do not buy foods that are calcium-enriched or that have calcium added to them (fortified). Buy canned vegetables and beans that say "no salt added." Rinse them before eating. Lifestyle Limit the amount of protein you eat from animal sources each day. Focus on  protein from plant sources, like tofu and dried beans, peas, and lentils. Do not add salt to food when cooking or before eating. Do not eat star fruit. It can be toxic for people with kidney problems. Talk with your health care provider before taking any vitamin or mineral supplements. If told by your provider: Track how much liquid you have so you can avoid drinking too much. Try to eat foods that are made mostly from water, like gelatin, ice cream, soups, and juicy fruits and vegetables. If you have diabetes and chronic kidney disease: If you have diabetes and CKD, you need to keep your blood sugar (glucose) in the target range recommended by your provider. Follow your diabetes management plan. This may include: Checking your blood glucose regularly. Taking medicines by mouth, or taking insulin, or both. Exercising for at least 30 minutes on 5 or more days each week, or as told by your provider. Tracking how many servings of carbohydrates you eat at each meal. Not using orange juice to treat low blood sugars. Instead, use apple juice, cranberry juice, or clear soda. You may be given guidelines on what foods and nutrients you may eat, and how much you can have each day. This depends on your stage of kidney disease and whether you have high blood pressure. Follow the meal plan your dietitian gives you. Where to find more information [employer] and [employer]estive [employer] Kidney Diseases: [employer] National Kidney Foundation: kidney.org This information is not intended to replace advice given to you by your health care provider. Make sure you discuss any questions you have with your health care provider. Document Revised: 06/09/2023 Document Reviewed: 06/09/2023 Elsevier Patient Education  2024 [employer].

## 2024-04-08 NOTE — Assessment & Plan Note (Signed)
 Chronic, ongoing.  Continue current medication regimen and adjust as needed. Lipid panel today.

## 2024-04-08 NOTE — Assessment & Plan Note (Signed)
 Recheck level today, is on Risperdal . Past level mild elevation, however is well controlled on Risperdal  and has been on for several years.  No symptoms with elevation in Prolactin.

## 2024-04-08 NOTE — Progress Notes (Signed)
 BP 100/64   Pulse 80   Temp 98.3 F (36.8 C) (Oral)   Ht 5' (1.524 m)   Wt 133 lb (60.3 kg)   LMP  (LMP Unknown)   SpO2 98%   BMI 25.97 kg/m    Subjective:    Patient ID: Denise Everett, female    DOB: 1951-01-07, 73 y.o.   MRN: [id number]  HPI: Denise Everett is a 73 y.o. female  Chief Complaint  Patient presents with   Schizophrenia   Chronic Kidney Disease   Hyperlipidemia   Hypertension   HYPERTENSION / HYPERLIPIDEMIA Continues to take Lisinopril  5 MG daily and Atorvastatin  10 MG daily.  Satisfied with current treatment? yes Duration of hypertension: chronic BP monitoring frequency: not checking BP range:  BP medication side effects: no Duration of hyperlipidemia: chronic Cholesterol medication side effects: no Cholesterol supplements: none Medication compliance: fair compliance Aspirin: no Recent stressors: no Recurrent headaches: no Visual changes: no Palpitations: no Dyspnea: no Chest pain: no Lower extremity edema: no Dizzy/lightheaded: no   CHRONIC KIDNEY DISEASE Has not seen nephrology since 04/10/22, was to return in October 2023, but missed visit. We have discussed at length multiple times now the need to maintain these visits.  She has not returned, even after lengthy discussions with her and providing phone number to her.  Have also placed SW referral in past for transportation needs and she did not attend.  She reports every time she calls kidney doctor she can not get through. CKD status: stable Medications renally dose: yes Previous renal evaluation: yes Pneumovax:  Up to Date Influenza Vaccine:  Up to Date   COPD Lung CT screening in 2018 which showed emphysema & aortic atherosclerosis and was to repeat in 12 months.  Has missed follow-up screening due to lack of transportation, the SW has worked with patient in past on this and she still has not attended. History of smoking, started at 13.  Quit >7 years ago.  COPD status: stable Satisfied  with current treatment?: yes Oxygen use: no Dyspnea frequency: none Cough frequency: none Rescue inhaler frequency:  none Limitation of activity: no Productive cough: none Last Spirometry: unknown --refuses today Pneumovax: Up to Date Influenza: Up to Date   CHRONIC PAIN  Takes Lyrica  50 MG BID, Tizanidine , and Tylenol  as needed. This offers benefit to her chronic pain.  Was recently seen for back/left thigh pain in office, given Celecoxib . She reports this took pain away, but continues to have some soreness. This has been going on for 2 months.  Last lumbar imaging was 08/07/2020 when she was noted to have chronic severe L3-L4 through L5-S1 degenerative disc.  Has thoracic scoliosis and on imaging 2021 has possible mild T12 endplate compression fracture, she reports never having a fracture.  Has had a couple recent falls with no injury.  She feels like she needs a little increase in Lyrica . Pain control status: uncontrolled Duration: chronic Location: lower back and down legs Quality: dull, aching, and throbbing Current Pain Level: 5/10 Previous Pain Level: 10/10 Breakthrough pain: no Benefit from narcotic medications: no What Activities task can be accomplished with current medication?  All ADR currently Previous pain specialty evaluation: yes Non-narcotic analgesic meds: Tylenol  Narcotic contract: no   MGUS/THROMBOCYTOPENIA Has followed with Dr. Adrian Alba for MGUS and ITP. Last visit 04/01/22 and was to return in 6 months, which she did not even though there has been SW assistance with transportation.  We have had multiple discussions on need to  attend visits with them, which she reports she is aware of.  Has not gone for colonoscopy, she has not been able to because she reports having no support person, which SW in past tried to help her with. Saw GI on 05/01/20 last.  Fever: no Nausea: no Vomiting: no Weight loss: no Decreased appetite: no Diarrhea: no Constipation: no Blood in  stool: no Heartburn: yes Jaundice: no Rash: no Dysuria/urinary frequency: no Hematuria: no History of sexually transmitted disease: no Recurrent NSAID use: no   SCHIZOPHRENIA Continues Mirtazapine  and Risperdal  for mood.  She has had some elevations in Prolactin level, however she has been on this regimen with benefit for several years and is not symptomatic with this.  Refuses to change medications. Mood status: stable Satisfied with current treatment?: yes Symptom severity: moderate  Duration of current treatment : chronic Side effects: no Medication compliance: good compliance Previous psychiatric medications: multiple Depressed mood: no Anxious mood: no Anhedonia: no Significant weight loss or gain: no Insomnia: none Fatigue: no Feelings of worthlessness or guilt: no Impaired concentration/indecisiveness: no Suicidal ideations: no Hopelessness: no Crying spells: no    01/21/2024    1:57 PM 10/19/2023    2:16 PM 08/27/2023    1:10 PM 08/19/2023    1:17 PM 05/08/2023    1:52 PM  Depression screen PHQ 2/9  Decreased Interest 0 0 0 0 0  Down, Depressed, Hopeless 0 0 0 0 0  PHQ - 2 Score 0 0 0 0 0  Altered sleeping 0 0 0 0 0  Tired, decreased energy 0 0 0 2 0  Change in appetite 3 0 0 0 0  Feeling bad or failure about yourself  0 0 0 0 0  Trouble concentrating 0 0 0 2 0  Moving slowly or fidgety/restless 0 0 0 2 0  Suicidal thoughts 0 0 0 0 0  PHQ-9 Score 3 0 0 6 0  Difficult doing work/chores Not difficult at all Not difficult at all Not difficult at all  Not difficult at all       10/19/2023    2:16 PM 08/27/2023    1:11 PM 08/19/2023    1:17 PM 05/08/2023    1:53 PM  GAD 7 : Generalized Anxiety Score  Nervous, Anxious, on Edge 0 0 0 0  Control/stop worrying 0 0 0 0  Worry too much - different things 0 0 0 0  Trouble relaxing 0 0 0 0  Restless 0 0 0 0  Easily annoyed or irritable 0 0 0 0  Afraid - awful might happen 0 0 0 0  Total GAD 7 Score 0 0 0 0   Anxiety Difficulty Not difficult at all Not difficult at all  Not difficult at all   AIMS:  Facial and Oral Movements  Muscles of Facial Expression: Normal Lips and Perioral Area: None Jaw: None Tongue: None Extremity Movements: none Upper (arms, wrists, hands, fingers): None Lower (legs, knees, ankles, toes): None Trunk Movements: none Neck, shoulders, hips: None Overall Severity : 0 Severity of abnormal movements (highest score from questions above): None Incapacitation due to abnormal movements: None Patient's awareness of abnormal movements (rate only patient's report): No Awareness Dental Status  Current problems with teeth and/or dentures?: No  Does patient usually wear dentures?: No    Relevant past medical, surgical, family and social history reviewed and updated as indicated. Interim medical history since our last visit reviewed. Allergies and medications reviewed and updated.  Review of Systems  Constitutional:  Negative for activity change, appetite change, diaphoresis, fatigue and fever.  Respiratory:  Negative for cough, chest tightness, shortness of breath and wheezing.   Cardiovascular:  Negative for chest pain, palpitations and leg swelling.  Gastrointestinal: Negative.   Musculoskeletal:  Positive for back pain.  Neurological: Negative.   Psychiatric/Behavioral:  Negative for decreased concentration, self-injury, sleep disturbance and suicidal ideas. The patient is not nervous/anxious.    Per HPI unless specifically indicated above    Objective:    BP 100/64   Pulse 80   Temp 98.3 F (36.8 C) (Oral)   Ht 5' (1.524 m)   Wt 133 lb (60.3 kg)   LMP  (LMP Unknown)   SpO2 98%   BMI 25.97 kg/m   Wt Readings from Last 3 Encounters:  04/08/24 133 lb (60.3 kg)  02/12/24 132 lb (59.9 kg)  01/21/24 132 lb (59.9 kg)    Physical Exam Vitals and nursing note reviewed.  Constitutional:      General: She is awake. She is not in acute distress.    Appearance:  She is well-developed and well-groomed. She is not ill-appearing or toxic-appearing.  HENT:     Head: Normocephalic.     Right Ear: Hearing and external ear normal.     Left Ear: Hearing and external ear normal.  Eyes:     General: Lids are normal.        Right eye: No discharge.        Left eye: No discharge.     Conjunctiva/sclera: Conjunctivae normal.     Pupils: Pupils are equal, round, and reactive to light.  Neck:     Thyroid : No thyromegaly.     Vascular: No carotid bruit.  Cardiovascular:     Rate and Rhythm: Normal rate and regular rhythm.     Heart sounds: Normal heart sounds. No murmur heard.    No gallop.  Pulmonary:     Effort: Pulmonary effort is normal. No accessory muscle usage or respiratory distress.     Breath sounds: Normal breath sounds.  Abdominal:     General: Bowel sounds are normal. There is no distension.     Palpations: Abdomen is soft.     Tenderness: There is no abdominal tenderness.  Musculoskeletal:     Cervical back: Normal range of motion and neck supple.     Thoracic back: Scoliosis present.     Lumbar back: Tenderness present. No swelling, spasms or bony tenderness. Normal range of motion. Positive left straight leg raise test. Negative right straight leg raise test.     Right lower leg: No edema.     Left lower leg: No edema.  Lymphadenopathy:     Cervical: No cervical adenopathy.  Skin:    General: Skin is warm and dry.  Neurological:     Mental Status: She is alert and oriented to person, place, and time.     Deep Tendon Reflexes: Reflexes are normal and symmetric.     Reflex Scores:      Brachioradialis reflexes are 2+ on the right side and 2+ on the left side.      Patellar reflexes are 2+ on the right side and 2+ on the left side. Psychiatric:        Attention and Perception: Attention normal.        Mood and Affect: Mood normal.        Speech: Speech normal.        Behavior: Behavior normal. Behavior is  cooperative.         Thought Content: Thought content normal.    Results for orders placed or performed in visit on 02/16/24  Fecal occult blood, imunochemical   Collection Time: 02/04/24 12:00 AM  Result Value Ref Range   IFOBT Negative       Assessment & Plan:   Problem List Items Addressed This Visit       Cardiovascular and Mediastinum   Hypertension   Chronic, stable.  BP well below goal today. Continue current medication regimen and adjust as needed.  Recommend she check BP at home at least 3 mornings a week and document.  DASH diet focus.  If low readings consider discontinuation or reduction to 2.5 MG of Lisinopril .  LABS: CMP, CBC, Lipid, urine ALB.      Relevant Orders   CBC with Differential/Platelet   Comprehensive metabolic panel with GFR   TSH   Aortic atherosclerosis (HCC)   Chronic. Noted on CT imaging 08/07/20.  Continue statin for prevention.  Recommend continued cessation of smoking.  No ASA due to ITP.      Relevant Orders   AMB Referral VBCI Care Management     Respiratory   Centrilobular emphysema (HCC)   Ongoing.  Noted on CT 02/03/2017.  Is a former smoker, reports quitting >7 years ago.  No current inhalers.  Benefit from spirometry in future, refuses today.  Had repeat lung CT ordered, but has not attended or scheduled this -- highly recommend she schedule this.  Is non compliant often with recommendations.  Will continue to work closely with [facility] team on assisting with this.      Relevant Medications   methylPREDNISolone  (MEDROL  DOSEPAK) 4 MG TBPK tablet   Other Relevant Orders   AMB Referral VBCI Care Management     Musculoskeletal and Integument   Osteoporosis   Chronic, no recent DEXA in chart. Continue Evista  daily, refills up to date, and recommend Vitamin D  daily.  Check level today.  Recommend repeat DEXA and mammogram and will continue to work on transportation with CCM.      Relevant Orders   VITAMIN D  25 Hydroxy (Vit-D Deficiency, Fractures)      Genitourinary   CKD (chronic kidney disease) stage 4, GFR 15-29 ml/min (HCC)   Chronic, ongoing.  Continue current medication regimen and collaboration with nephrology.  CCM collaboration continues, will place new referral as new team present.  Highly recommend she schedule follow-up with nephrology, have continued to recommend and she has not attended.  Discussed at length importance of nephrology involvement in CKD 4.  LABS: CBC, CMP, TSH, urine ALB.      Relevant Orders   Comprehensive metabolic panel with GFR   Parathyroid hormone, intact (no Ca)   Microalbumin, Urine Waived   AMB Referral VBCI Care Management     Hematopoietic and Hemostatic   Idiopathic thrombocytopenic purpura (HCC) (Chronic)   Chronic, ongoing.  Continue to collaborate with hematology, recent note reviewed.  She denies bleeding or increased bruising.  Have highly recommended not to miss appointments with them due to disease process and ongoing continuity of care.  Will obtain labs today.  Stop Celecoxib .      Relevant Orders   CBC with Differential/Platelet   AMB Referral VBCI Care Management     Other   Chronic pain syndrome (Chronic)   Chronic, ongoing. Increase Lyrica  to 75 MG BID as this medication does offer her benefit, continue this regimen with Tylenol  as needed. Recommend she  stop Celecoxib  due to her ITP and will send in steroid taper which has offered her benefit in past for acute pain.      Relevant Medications   diclofenac  Sodium (VOLTAREN ) 1 % GEL   pregabalin  (LYRICA ) 75 MG capsule   methylPREDNISolone  (MEDROL  DOSEPAK) 4 MG TBPK tablet   Schizophrenia (HCC) - Primary   Chronic, ongoing.  Continue current medication regimen, Risperdal , and adjust as needed. Has been on this regimen for years with benefit.  Does have underlying paranoia at baseline.  Refills up to date.  Prolactin level today, this has been slightly elevated but has been on regimen for several years with tolerance and refuses  changes.      Relevant Orders   TSH   MGUS (monoclonal gammopathy of unknown significance)   Chronic. Continue to collaborate with hematology, recent note reviewed.  She denies bleeding or increased bruising. Highly recommend she return to hematology.      Relevant Orders   CBC with Differential/Platelet   Hyperlipidemia   Chronic, ongoing.  Continue current medication regimen and adjust as needed.  Lipid panel today.        Relevant Orders   Comprehensive metabolic panel with GFR   Lipid Panel w/o Chol/HDL Ratio   Elevated prolactin level   Recheck level today, is on Risperdal . Past level mild elevation, however is well controlled on Risperdal  and has been on for several years.  No symptoms with elevation in Prolactin.      Relevant Orders   Prolactin     Follow up plan: Return in about 6 weeks (around 05/20/2024) for Chronic pain -- increased Lyrica  dose -- may cancel June 10th visit.

## 2024-04-08 NOTE — Assessment & Plan Note (Signed)
Chronic. Noted on CT imaging 08/07/20.  Continue statin for prevention.  Recommend continued cessation of smoking.  No ASA due to ITP. 

## 2024-04-08 NOTE — Assessment & Plan Note (Signed)
 Chronic, no recent DEXA in chart. Continue Evista  daily, refills up to date, and recommend Vitamin D  daily.  Check level today.  Recommend repeat DEXA and mammogram and will continue to work on transportation with CCM.

## 2024-04-08 NOTE — Assessment & Plan Note (Signed)
 Chronic, ongoing.  Continue to collaborate with hematology, recent note reviewed.  She denies bleeding or increased bruising.  Have highly recommended not to miss appointments with them due to disease process and ongoing continuity of care.  Will obtain labs today.  Stop Celecoxib .

## 2024-04-08 NOTE — Assessment & Plan Note (Signed)
 Chronic. Continue to collaborate with hematology, recent note reviewed.  She denies bleeding or increased bruising. Highly recommend she return to hematology.

## 2024-04-08 NOTE — Assessment & Plan Note (Signed)
 Chronic, ongoing. Increase Lyrica  to 75 MG BID as this medication does offer her benefit, continue this regimen with Tylenol  as needed. Recommend she stop Celecoxib  due to her ITP and will send in steroid taper which has offered her benefit in past for acute pain.

## 2024-04-08 NOTE — Assessment & Plan Note (Signed)
 Chronic, ongoing.  Continue current medication regimen, Risperdal , and adjust as needed. Has been on this regimen for years with benefit.  Does have underlying paranoia at baseline.  Refills up to date.  Prolactin level today, this has been slightly elevated but has been on regimen for several years with tolerance and refuses changes.

## 2024-04-09 LAB — LIPID PANEL W/O CHOL/HDL RATIO
Cholesterol, Total: 142 mg/dL (ref 100–199)
HDL: 40 mg/dL (ref >39)
LDL Chol Calc (NIH): 82 mg/dL (ref 0–99)
Triglycerides: 106 mg/dL (ref 0–149)
VLDL Cholesterol Cal: 20 mg/dL (ref 5–40)

## 2024-04-09 LAB — CBC WITH DIFFERENTIAL/PLATELET
Basophils Absolute: 0 10*3/uL (ref 0.0–0.2)
Basos: 0 %
EOS (ABSOLUTE): 0.3 10*3/uL (ref 0.0–0.4)
Eos: 4 %
Hematocrit: 34.6 % (ref 34.0–46.6)
Hemoglobin: 11.6 g/dL (ref 11.1–15.9)
Immature Grans (Abs): 0.1 10*3/uL (ref 0.0–0.1)
Immature Granulocytes: 1 %
Lymphocytes Absolute: 3.6 10*3/uL — ABNORMAL HIGH (ref 0.7–3.1)
Lymphs: 40 %
MCH: 32.9 pg (ref 26.6–33.0)
MCHC: 33.5 g/dL (ref 31.5–35.7)
MCV: 98 fL — ABNORMAL HIGH (ref 79–97)
Monocytes Absolute: 0.7 10*3/uL (ref 0.1–0.9)
Monocytes: 8 %
Neutrophils Absolute: 4.2 10*3/uL (ref 1.4–7.0)
Neutrophils: 47 %
Platelets: 57 10*3/uL — CL (ref 150–450)
RBC: 3.53 x10E6/uL — ABNORMAL LOW (ref 3.77–5.28)
RDW: 12.3 % (ref 11.7–15.4)
WBC: 9 10*3/uL (ref 3.4–10.8)

## 2024-04-09 LAB — VITAMIN D 25 HYDROXY (VIT D DEFICIENCY, FRACTURES): Vit D, 25-Hydroxy: 36.1 ng/mL (ref 30.0–100.0)

## 2024-04-09 LAB — COMPREHENSIVE METABOLIC PANEL WITH GFR
ALT: 6 IU/L (ref 0–32)
AST: 12 IU/L (ref 0–40)
Albumin: 3.7 g/dL — ABNORMAL LOW (ref 3.8–4.8)
Alkaline Phosphatase: 66 IU/L (ref 44–121)
BUN/Creatinine Ratio: 12 (ref 12–28)
BUN: 30 mg/dL — ABNORMAL HIGH (ref 8–27)
Bilirubin Total: 0.3 mg/dL (ref 0.0–1.2)
CO2: 16 mmol/L — ABNORMAL LOW (ref 20–29)
Calcium: 8.5 mg/dL — ABNORMAL LOW (ref 8.7–10.3)
Chloride: 98 mmol/L (ref 96–106)
Creatinine, Ser: 2.43 mg/dL — ABNORMAL HIGH (ref 0.57–1.00)
Globulin, Total: 2.5 g/dL (ref 1.5–4.5)
Glucose: 87 mg/dL (ref 70–99)
Potassium: 3.7 mmol/L (ref 3.5–5.2)
Sodium: 132 mmol/L — ABNORMAL LOW (ref 134–144)
Total Protein: 6.2 g/dL (ref 6.0–8.5)
eGFR: 20 mL/min/{1.73_m2} — ABNORMAL LOW (ref >59)

## 2024-04-09 LAB — PARATHYROID HORMONE, INTACT (NO CA): PTH: 21 pg/mL (ref 15–65)

## 2024-04-09 LAB — TSH: TSH: 0.339 u[IU]/mL — ABNORMAL LOW (ref 0.450–4.500)

## 2024-04-09 LAB — PROLACTIN: Prolactin: 324 ng/mL — ABNORMAL HIGH (ref 3.6–25.2)

## 2024-04-10 ENCOUNTER — Ambulatory Visit: Payer: Self-pay | Admitting: Nurse Practitioner

## 2024-04-10 DIAGNOSIS — R7989 Other specified abnormal findings of blood chemistry: Secondary | ICD-10-CM

## 2024-04-10 NOTE — Progress Notes (Signed)
 Please call patient and also schedule lab only visit: Good morning Denise Everett, your labs have returned: - CBC shows no anemia, but platelets are still very low.  I need her to return to hematology ASAP and see them at least every 6 months. This is very important. We can assist her to schedule this if needed, but she needs to answer phone so we can tell her date and time.  I also will have social work and nursing calling her again to see how we can help her get to visits that are important for her health. - Kidney function continues to show Stage 4 kidney disease. She needs to return to kidney doctor ASAP and we can assist her with scheduling this too. - Thyroid  level is on lower side, more hyper or over active.  This is new for her.  I would like to recheck this outpatient in 6 weeks to ensure it does not stay on lower side. - Prolactin level remains elevated, which can be related to your Risperdal .  I know you have been on this for years though and we will continue to monitor level.  Remainder of labs stable.  Any questions? Keep being stellar!!  Thank you for allowing me to participate in your care.  I appreciate you. Kindest regards, Sandip Power

## 2024-04-13 ENCOUNTER — Telehealth: Payer: Self-pay

## 2024-04-13 NOTE — Progress Notes (Signed)
 Complex Care Management Note Care Guide Note  04/13/2024 Name: Denise Everett MRN: [id number] DOB: 1951-10-20   Complex Care Management Outreach Attempts: An unsuccessful telephone outreach was attempted today to offer the patient information about available complex care management services.  Follow Up Plan:  Additional outreach attempts will be made to offer the patient complex care management information and services.   Encounter Outcome:  No Answer  Lenton Rail , RMA     [place]  [facility], [facility] Guide  Direct Dial: [phone]  Website: [email or web address]

## 2024-04-15 NOTE — Progress Notes (Signed)
 Complex Care Management Note Care Guide Note  04/15/2024 Name: RODNEY WIGGER MRN: [id number] DOB: 1951-07-16   Complex Care Management Outreach Attempts: A second unsuccessful outreach was attempted today to offer the patient with information about available complex care management services.  Follow Up Plan:  Additional outreach attempts will be made to offer the patient complex care management information and services.   Encounter Outcome:  No Answer  Lenton Rail , RMA       [facility], [facility] Guide  Direct Dial: [phone]  Website: [email or web address]

## 2024-04-18 ENCOUNTER — Other Ambulatory Visit: Payer: Self-pay | Admitting: Nurse Practitioner

## 2024-04-18 NOTE — Telephone Encounter (Signed)
 Requested Prescriptions  Pending Prescriptions Disp Refills   mirtazapine  (REMERON ) 15 MG tablet [Pharmacy Med Name: MIRTAZAPINE  15 MG TAB] 45 tablet 0    Sig: TAKE 1/2 TABLET (7.5 MG TOTAL) BY MOUTH AT BEDTIME     Psychiatry: Antidepressants - mirtazapine  Passed - 04/18/2024  2:55 PM      Passed - Valid encounter within last 6 months    Recent Outpatient Visits           1 week ago Schizophrenia, unspecified type (HCC)   [place] [facility] [place], [employer] [place] T, NP   2 months ago Lumbar radiculopathy   [place] [facility], Megan P, DO               tiZANidine  (ZANAFLEX ) 4 MG tablet [Pharmacy Med Name: TIZANIDINE  HCL 4 MG TAB] 60 tablet 4    Sig: TAKE 1 TABLET BY MOUTH EVERY 6 HOURS AS NEEDED FOR MUSCLE SPASMS     Not Delegated - Cardiovascular:  Alpha-2 Agonists - tizanidine  Failed - 04/18/2024  2:55 PM      Failed - This refill cannot be delegated      Passed - Valid encounter within last 6 months    Recent Outpatient Visits           1 week ago Schizophrenia, unspecified type (HCC)   [place] [facility] [place], Sanjuan Crumb[place] T, NP   2 months ago Lumbar radiculopathy   [place] [facility] [place], [place], DO

## 2024-04-18 NOTE — Telephone Encounter (Signed)
 Requested medication (s) are due for refill today: yes  Requested medication (s) are on the active medication list: yes  Last refill:  12/29/23 #60/4  Future visit scheduled: yes  Notes to clinic:  Unable to refill per protocol, cannot delegate.    Requested Prescriptions  Pending Prescriptions Disp Refills   tiZANidine  (ZANAFLEX ) 4 MG tablet [Pharmacy Med Name: TIZANIDINE  HCL 4 MG TAB] 60 tablet 4    Sig: TAKE 1 TABLET BY MOUTH EVERY 6 HOURS AS NEEDED FOR MUSCLE SPASMS     Not Delegated - Cardiovascular:  Alpha-2 Agonists - tizanidine  Failed - 04/18/2024  2:55 PM      Failed - This refill cannot be delegated      Passed - Valid encounter within last 6 months    Recent Outpatient Visits           1 week ago Schizophrenia, unspecified type (HCC)   [place] [facility] [place], [place] T, NP   2 months ago Lumbar radiculopathy   [place] [facility] [place], [place], DO              Signed Prescriptions Disp Refills   mirtazapine  (REMERON ) 15 MG tablet 45 tablet 0    Sig: TAKE 1/2 TABLET (7.5 MG TOTAL) BY MOUTH AT BEDTIME     Psychiatry: Antidepressants - mirtazapine  Passed - 04/18/2024  2:55 PM      Passed - Valid encounter within last 6 months    Recent Outpatient Visits           1 week ago Schizophrenia, unspecified type (HCC)   [place] [facility] [place], Sanjuan Crumbly T, NP   2 months ago Lumbar radiculopathy   [place] [facility] [place], [place], DO

## 2024-04-19 ENCOUNTER — Ambulatory Visit: Payer: Self-pay | Admitting: Nurse Practitioner

## 2024-04-19 NOTE — Progress Notes (Signed)
 Complex Care Management Note Care Guide Note  04/19/2024 Name: Denise Everett MRN: [id number] DOB: [birth year]   Complex Care Management Outreach Attempts: A third unsuccessful outreach was attempted today to offer the patient with information about available complex care management services.  Follow Up Plan:  No further outreach attempts will be made at this time. We have been unable to contact the patient to offer or enroll patient in complex care management services.  Encounter Outcome:  No Answer  Lenton Rail , RMA     [place]  [facility], [facility] Guide  Direct Dial: [phone]  Website: [email or web address]

## 2024-04-30 NOTE — Patient Instructions (Incomplete)

## 2024-05-02 ENCOUNTER — Ambulatory Visit: Admitting: Nurse Practitioner

## 2024-05-14 NOTE — Patient Instructions (Signed)

## 2024-05-14 NOTE — Patient Instructions (Incomplete)
 Chronic Back Pain Chronic back pain is back pain that lasts longer than 3 months. The pain may get worse at certain times (flare-ups). There are things you can do at home to manage your pain. Follow these instructions at home: Watch for any changes in your symptoms. Take these actions to help with your pain: Managing pain and stiffness     If told, put ice on the painful area. You may be told to use ice for 24-48 hours after a flare-up starts. Put ice in a plastic bag. Place a towel between your skin and the bag. Leave the ice on for 20 minutes, 2-3 times a day. If told, put heat on the painful area. Do this as often as told by your doctor. Use the heat source that your doctor recommends, such as a moist heat pack or a heating pad. Place a towel between your skin and the heat source. Leave the heat on for 20-30 minutes. If your skin turns bright red, take off the ice or heat right away to prevent skin damage. The risk of damage is higher if you cannot feel pain, heat, or cold. Soak in a warm bath. This can help with pain. Activity        Avoid bending and other activities that make the pain worse. When you stand: Keep your upper back and neck straight. Keep your shoulders pulled back. Avoid slouching. When you sit: Keep your back straight. Relax your shoulders. Do not round your shoulders or pull them backward. Do not sit or stand in one place for too long. Take short rest breaks during the day. Lying down or standing is often better than sitting. Resting can help relieve pain. When sitting or lying down for a long time, do some mild activity or stretching. This will help to prevent stiffness and pain. Get regular exercise. Ask your doctor what activities are safe for you. You may have to avoid lifting. Ask your provider how much you can safely lift. If you lift things: Bend your knees. Keep the weight close to your body. Avoid twisting. Medicines Take over-the-counter and  prescription medicines only as told by your doctor. You may need to take medicines for pain and swelling. These may be taken by mouth or put on the skin. You may also be given muscle relaxants. Ask your doctor if the medicine prescribed to you: Requires you to avoid driving or using machinery. Can cause trouble pooping (constipation). You may need to take these actions to prevent or treat trouble pooping: Drink enough fluid to keep your pee (urine) pale yellow. Take over-the-counter or prescription medicines. Eat foods that are high in fiber. These include beans, whole grains, and fresh fruits and vegetables. Limit foods that are high in fat and sugars. These include fried or sweet foods. General instructions  Sleep on a firm mattress. Try lying on your side with your knees slightly bent. If you lie on your back, put a pillow under your knees. Do not smoke or use any products that contain nicotine or tobacco. If you need help quitting, ask your doctor. Contact a doctor if: Your pain does not get better with rest or medicine. You have new pain. You have a fever. You lose weight quickly. You have trouble doing your normal activities. One or both of your legs or feet feel weak. One or both of your legs or feet lose feeling (have numbness). Get help right away if: You are not able to control when you pee  or poop. You have bad back pain and: You feel like you may vomit (nauseous). You vomit. You have pain in your chest or your belly (abdomen). You have shortness of breath. You faint. These symptoms may be an emergency. Get help right away. Call 911. Do not wait to see if the symptoms will go away. Do not drive yourself to the hospital. This information is not intended to replace advice given to you by your health care provider. Make sure you discuss any questions you have with your health care provider. Document Revised: 06/16/2022 Document Reviewed: 06/16/2022 Elsevier Patient Education   2024 [employer].

## 2024-05-17 ENCOUNTER — Encounter: Payer: Self-pay | Admitting: Nurse Practitioner

## 2024-05-17 ENCOUNTER — Ambulatory Visit (INDEPENDENT_AMBULATORY_CARE_PROVIDER_SITE_OTHER): Admitting: Nurse Practitioner

## 2024-05-17 VITALS — BP 132/74 | HR 85 | Temp 98.1°F | Ht 60.0 in | Wt 129.6 lb

## 2024-05-17 DIAGNOSIS — I10 Essential (primary) hypertension: Secondary | ICD-10-CM

## 2024-05-17 DIAGNOSIS — N184 Chronic kidney disease, stage 4 (severe): Secondary | ICD-10-CM

## 2024-05-17 DIAGNOSIS — G894 Chronic pain syndrome: Secondary | ICD-10-CM

## 2024-05-17 DIAGNOSIS — R6889 Other general symptoms and signs: Secondary | ICD-10-CM | POA: Diagnosis not present

## 2024-05-17 DIAGNOSIS — R7989 Other specified abnormal findings of blood chemistry: Secondary | ICD-10-CM

## 2024-05-17 DIAGNOSIS — M810 Age-related osteoporosis without current pathological fracture: Secondary | ICD-10-CM

## 2024-05-17 MED ORDER — RALOXIFENE HCL 60 MG PO TABS: 60.0000 mg | ORAL_TABLET | Freq: Every day | ORAL | 4 refills | Status: AC

## 2024-05-17 MED ORDER — RISPERIDONE 2 MG PO TABS: 1.0000 mg | ORAL_TABLET | Freq: Every day | ORAL | 4 refills | Status: AC

## 2024-05-17 MED ORDER — FAMOTIDINE 20 MG PO TABS: 20.0000 mg | ORAL_TABLET | Freq: Every day | ORAL | 4 refills | Status: AC

## 2024-05-17 MED ORDER — ONDANSETRON HCL 4 MG PO TABS: 4.0000 mg | ORAL_TABLET | Freq: Three times a day (TID) | ORAL | 0 refills | Status: AC | PRN

## 2024-05-17 MED ORDER — PREGABALIN 75 MG PO CAPS: 75.0000 mg | ORAL_CAPSULE | Freq: Two times a day (BID) | ORAL | 1 refills | Status: DC

## 2024-05-17 MED ORDER — ATORVASTATIN CALCIUM 10 MG PO TABS: ORAL_TABLET | ORAL | 4 refills | Status: AC

## 2024-05-17 MED ORDER — MIRTAZAPINE 15 MG PO TABS: 7.5000 mg | ORAL_TABLET | Freq: Every day | ORAL | 3 refills | Status: AC

## 2024-05-17 MED ORDER — KETOROLAC TROMETHAMINE 60 MG/2ML IM SOLN
30.0000 mg | Freq: Once | INTRAMUSCULAR | Status: AC
  Administered 2024-05-17: 30 mg via INTRAMUSCULAR

## 2024-05-17 MED ORDER — LISINOPRIL 5 MG PO TABS: 5.0000 mg | ORAL_TABLET | Freq: Every day | ORAL | 4 refills | Status: AC
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
## 2024-05-18 ENCOUNTER — Ambulatory Visit: Payer: Self-pay | Admitting: Nurse Practitioner

## 2024-05-18 LAB — THYROID PEROXIDASE ANTIBODY: Thyroperoxidase Ab SerPl-aCnc: 12 [IU]/mL (ref 0–34)

## 2024-05-18 LAB — T4, FREE: Free T4: 0.79 ng/dL — ABNORMAL LOW (ref 0.82–1.77)

## 2024-05-18 LAB — DRUG SCREEN 764883 11+OXYCO+ALC+CRT-BUND

## 2024-05-18 LAB — TSH: TSH: 0.497 u[IU]/mL (ref 0.450–4.500)

## 2024-05-18 NOTE — Progress Notes (Signed)
 Please let Staley know thyroid  labs are improved at present, just mildly low Free T4. No medications needed and we can continue to monitor.  Drug screen is still pending. Any questions? Have a great day!!

## 2024-05-20 ENCOUNTER — Ambulatory Visit: Admitting: Nurse Practitioner

## 2024-05-22 LAB — DRUG SCREEN 764883 11+OXYCO+ALC+CRT-BUND
Amphetamines, Urine: NEGATIVE ng/mL
BENZODIAZ UR QL: NEGATIVE ng/mL
Barbiturate screen, urine: NEGATIVE ng/mL
Cocaine (Metab.): NEGATIVE ng/mL
Creatinine, Urine: 79.2 mg/dL (ref 20.0–300.0)
Ethanol, Urine: NEGATIVE %
Meperidine: NEGATIVE ng/mL
Methadone Screen, Urine: NEGATIVE ng/mL
Nitrite Urine, Quantitative: NEGATIVE ug/mL
OPIATE SCREEN URINE: NEGATIVE ng/mL
Oxycodone/Oxymorphone, Urine: NEGATIVE ng/mL
PCP Quant, Ur: NEGATIVE ng/mL
Propoxyphene: NEGATIVE ng/mL
Tramadol: NEGATIVE ng/mL
pH, Urine: 5.8 (ref 4.5–8.9)

## 2024-05-22 LAB — CANNABINOID CONFIRMATION, UR
CANNABINOIDS: POSITIVE — AB
Carboxy THC GC/MS Conf: 15 ng/mL

## 2024-05-22 NOTE — Progress Notes (Signed)
 Please let Denise Everett know her urine returned + for marijuana, since she had been concerned about this.

## 2024-07-02 NOTE — Patient Instructions (Signed)

## 2024-07-05 ENCOUNTER — Encounter: Payer: Self-pay | Admitting: Nurse Practitioner

## 2024-07-05 ENCOUNTER — Ambulatory Visit (INDEPENDENT_AMBULATORY_CARE_PROVIDER_SITE_OTHER): Admitting: Nurse Practitioner

## 2024-07-05 VITALS — BP 133/81 | HR 83 | Temp 98.9°F | Ht 60.0 in | Wt 132.2 lb

## 2024-07-05 DIAGNOSIS — F209 Schizophrenia, unspecified: Secondary | ICD-10-CM

## 2024-07-05 DIAGNOSIS — G894 Chronic pain syndrome: Secondary | ICD-10-CM | POA: Diagnosis not present

## 2024-07-05 MED ORDER — PREGABALIN 100 MG PO CAPS: 100.0000 mg | ORAL_CAPSULE | Freq: Two times a day (BID) | ORAL | 2 refills | Status: AC

## 2024-07-05 NOTE — Assessment & Plan Note (Signed)
 Chronic, ongoing. Reports current Lyrica  dose is not helping a lot.  Will increase to 100 MG BID as this medication has offered her benefit in past, continue this regimen with Tylenol  as needed. Reiterated to stop Celecoxib  due to her ITP.

## 2024-07-05 NOTE — Assessment & Plan Note (Signed)
 Chronic, ongoing.  Continue current medication regimen, Risperdal , and adjust as needed. Has been on this regimen for years with benefit.  Does have underlying paranoia at baseline.  Refills up to date.  Prolactin level remains elevated but has been on regimen for several years with tolerance and refuses changes.

## 2024-07-05 NOTE — Progress Notes (Addendum)
 BP 133/81   Pulse 83   Temp 98.9 F (37.2 C) (Oral)   Ht 5' (1.524 m)   Wt 132 lb 3.2 oz (60 kg)   LMP  (LMP Unknown)   SpO2 99%   BMI 25.82 kg/m    Subjective:    Patient ID: Denise Everett, female    DOB: 09-30-1951, 73 y.o.   MRN: [id number]  HPI: Denise Everett is a 73 y.o. female  Chief Complaint  Patient presents with   Weight Check   CHRONIC PAIN  Follow-up today for increase in Lyrica  to 75 MG BID on 04/08/24, but she reports this is not helping but for 1-2 hours.  Taking Tizanidine  and Tylenol  as needed. Lumbar imaging was 08/07/2020 when she was noted to have chronic severe L3-L4 through L5-S1 degenerative disc + thoracic scoliosis and on imaging 2021 has possible mild T12 endplate compression fracture, she reports never having a fracture.  Last Lyrica  fill on 05/17/24.  Pain control status: uncontrolled Duration: chronic Location: lower back and down legs Quality: dull, aching, and throbbing Current Pain Level: 5/10 -- 10/10 at present Previous Pain Level: 10/10 Breakthrough pain: no Benefit from narcotic medications: no What Activities task can be accomplished with current medication?  All ADR currently Previous pain specialty evaluation: yes Non-narcotic analgesic meds: Tylenol  Narcotic contract: no   SCHIZOPHRENIA Taking Mirtazapine  and Risperdal  for mood. Elevations in Prolactin level, however she has been on this regimen with benefit for several years and is not symptomatic with this.  Recent level was 324.  Refuses to change medications as has been on for years. Mood status: stable Satisfied with current treatment?: yes Symptom severity: moderate  Duration of current treatment : chronic Side effects: no Medication compliance: good compliance Previous psychiatric medications: multiple Depressed mood: no Anxious mood: no Anhedonia: no Significant weight loss or gain: no Insomnia: none Fatigue: no Feelings of worthlessness or guilt: no Impaired  concentration/indecisiveness: no Suicidal ideations: no Hopelessness: no Crying spells: no    07/05/2024    3:47 PM 01/21/2024    1:57 PM 10/19/2023    2:16 PM 08/27/2023    1:10 PM 08/19/2023    1:17 PM  Depression screen PHQ 2/9  Decreased Interest 0 0 0 0 0  Down, Depressed, Hopeless 0 0 0 0 0  PHQ - 2 Score 0 0 0 0 0  Altered sleeping 1 0 0 0 0  Tired, decreased energy 1 0 0 0 2  Change in appetite 0 3 0 0 0  Feeling bad or failure about yourself  0 0 0 0 0  Trouble concentrating 0 0 0 0 2  Moving slowly or fidgety/restless 0 0 0 0 2  Suicidal thoughts 0 0 0 0 0  PHQ-9 Score 2 3 0 0 6  Difficult doing work/chores Not difficult at all Not difficult at all Not difficult at all Not difficult at all        07/05/2024    3:47 PM 10/19/2023    2:16 PM 08/27/2023    1:11 PM 08/19/2023    1:17 PM  GAD 7 : Generalized Anxiety Score  Nervous, Anxious, on Edge 0 0 0 0  Control/stop worrying 0 0 0 0  Worry too much - different things 0 0 0 0  Trouble relaxing 0 0 0 0  Restless 0 0 0 0  Easily annoyed or irritable 0 0 0 0  Afraid - awful might happen 0 0 0 0  Total  GAD 7 Score 0 0 0 0  Anxiety Difficulty Not difficult at all Not difficult at all Not difficult at all    Relevant past medical, surgical, family and social history reviewed and updated as indicated. Interim medical history since our last visit reviewed. Allergies and medications reviewed and updated.  Review of Systems  Constitutional:  Negative for activity change, appetite change, diaphoresis, fatigue and fever.  Respiratory:  Negative for cough, chest tightness, shortness of breath and wheezing.   Cardiovascular:  Negative for chest pain, palpitations and leg swelling.  Gastrointestinal: Negative.   Musculoskeletal:  Positive for back pain.  Neurological: Negative.   Psychiatric/Behavioral:  Negative for decreased concentration, self-injury, sleep disturbance and suicidal ideas. The patient is not nervous/anxious.      Per HPI unless specifically indicated above     Objective:    BP 133/81   Pulse 83   Temp 98.9 F (37.2 C) (Oral)   Ht 5' (1.524 m)   Wt 132 lb 3.2 oz (60 kg)   LMP  (LMP Unknown)   SpO2 99%   BMI 25.82 kg/m   Wt Readings from Last 3 Encounters:  07/05/24 132 lb 3.2 oz (60 kg)  05/17/24 129 lb 9.6 oz (58.8 kg)  04/08/24 133 lb (60.3 kg)    Physical Exam Vitals and nursing note reviewed.  Constitutional:      General: She is awake. She is not in acute distress.    Appearance: She is well-developed and well-groomed. She is not ill-appearing or toxic-appearing.  HENT:     Head: Normocephalic.     Right Ear: Hearing and external ear normal.     Left Ear: Hearing and external ear normal.  Eyes:     General: Lids are normal.        Right eye: No discharge.        Left eye: No discharge.     Extraocular Movements: Extraocular movements intact.     Conjunctiva/sclera: Conjunctivae normal.     Pupils: Pupils are equal, round, and reactive to light.     Visual Fields: Right eye visual fields normal and left eye visual fields normal.  Neck:     Thyroid : No thyromegaly.     Vascular: No carotid bruit.  Cardiovascular:     Rate and Rhythm: Normal rate and regular rhythm.     Heart sounds: Normal heart sounds. No murmur heard.    No gallop.  Pulmonary:     Effort: Pulmonary effort is normal. No accessory muscle usage or respiratory distress.     Breath sounds: Normal breath sounds.  Abdominal:     General: Bowel sounds are normal. There is no distension.     Palpations: Abdomen is soft.     Tenderness: There is no abdominal tenderness.  Musculoskeletal:     Cervical back: Normal range of motion and neck supple.     Right lower leg: No edema.     Left lower leg: No edema.  Lymphadenopathy:     Cervical: No cervical adenopathy.  Skin:    General: Skin is warm and dry.  Neurological:     Mental Status: She is alert and oriented to person, place, and time.     Cranial  Nerves: Cranial nerves 2-12 are intact.     Motor: Motor function is intact.     Coordination: Coordination is intact.     Deep Tendon Reflexes: Reflexes are normal and symmetric.     Reflex Scores:  Brachioradialis reflexes are 2+ on the right side and 2+ on the left side.      Patellar reflexes are 2+ on the right side and 2+ on the left side. Psychiatric:        Attention and Perception: Attention normal.        Mood and Affect: Mood normal.        Speech: Speech normal.        Behavior: Behavior normal. Behavior is cooperative.        Thought Content: Thought content normal.    Results for orders placed or performed in visit on 05/17/24  T4, free   Collection Time: 05/17/24  3:41 PM  Result Value Ref Range   Free T4 0.79 (L) 0.82 - 1.77 ng/dL  Thyroid  peroxidase antibody   Collection Time: 05/17/24  3:41 PM  Result Value Ref Range   Thyroperoxidase Ab SerPl-aCnc 12 0 - 34 IU/mL  TSH   Collection Time: 05/17/24  3:41 PM  Result Value Ref Range   TSH 0.497 0.450 - 4.500 uIU/mL  [id number] 11+Oxyco+Alc+Crt-Bund   Collection Time: 05/17/24  3:42 PM  Result Value Ref Range   Amphetamines, Urine Negative Cutoff=1000 ng/mL   Barbiturate screen, urine Negative Cutoff=200 ng/mL   BENZODIAZ UR QL Negative Cutoff=200 ng/mL   Cannabinoid Quant, Ur See Final Results Cutoff=50 ng/mL   Cocaine (Metab.) Negative Cutoff=300 ng/mL   OPIATE SCREEN URINE Negative Cutoff=300 ng/mL   Oxycodone/Oxymorphone, Urine Negative Cutoff=300 ng/mL   PCP Quant, Ur Negative Cutoff=25 ng/mL   Methadone Screen, Urine Negative Cutoff=300 ng/mL   Propoxyphene Negative Cutoff=300 ng/mL   Meperidine Negative Cutoff=200 ng/mL   Tramadol  Negative Cutoff=200 ng/mL   Ethanol, Urine Negative Cutoff=0.020 %   Creatinine, Urine 79.2 20.0 - 300.0 mg/dL   Nitrite Urine, Quantitative Negative Cutoff=200 mcg/mL   pH, Urine 5.8 4.5 - 8.9  Cannabinoid Conf, Ur   Collection Time: 05/17/24  3:42 PM  Result Value Ref  Range   CANNABINOIDS Positive (A) Cutoff=50   Carboxy THC GC/MS Conf 15 Cutoff=15 ng/mL      Assessment & Plan:   Problem List Items Addressed This Visit       Other   Chronic pain syndrome (Chronic)   Chronic, ongoing. Reports current Lyrica  dose is not helping a lot.  Will increase to 100 MG BID as this medication has offered her benefit in past, continue this regimen with Tylenol  as needed. Reiterated to stop Celecoxib  due to her ITP.        Relevant Medications   pregabalin  (LYRICA ) 100 MG capsule   Schizophrenia (HCC) - Primary   Chronic, ongoing.  Continue current medication regimen, Risperdal , and adjust as needed. Has been on this regimen for years with benefit.  Does have underlying paranoia at baseline.  Refills up to date.  Prolactin level remains elevated but has been on regimen for several years with tolerance and refuses changes.        Follow up plan: Return in about 8 weeks (around 08/30/2024) for CHRONIC BACK PAIN.

## 2024-07-12 ENCOUNTER — Ambulatory Visit: Payer: Self-pay

## 2024-07-12 ENCOUNTER — Other Ambulatory Visit: Payer: Self-pay | Admitting: Nurse Practitioner

## 2024-07-12 NOTE — Telephone Encounter (Signed)
 First attempt: LVM for patient to return call to [phone]   Copied from CRM #[id number]. Topic: Clinical - Medication Question >> Jul 12, 2024 11:10 AM Cleave MATSU wrote: Reason for CRM: pt said the pregablin is making her sick can Dr. Jolene give her something in place of that?

## 2024-07-12 NOTE — Telephone Encounter (Signed)
 Requested Prescriptions  Refused Prescriptions Disp Refills   lisinopril  (ZESTRIL ) 5 MG tablet [Pharmacy Med Name: LISINOPRIL  5 MG TAB] 90 tablet 4    Sig: TAKE 1 TABLET BY MOUTH DAILY     Cardiovascular:  ACE Inhibitors Failed - 07/12/2024  5:12 PM      Failed - Cr in normal range and within 180 days    Creatinine  Date Value Ref Range Status  06/23/2014 1.57 (H) 0.60 - 1.30 mg/dL Final   Creatinine, Ser  Date Value Ref Range Status  04/08/2024 2.43 (H) 0.57 - 1.00 mg/dL Final         Passed - K in normal range and within 180 days    Potassium  Date Value Ref Range Status  04/08/2024 3.7 3.5 - 5.2 mmol/L Final  06/23/2014 5.0 3.5 - 5.1 mmol/L Final         Passed - Patient is not pregnant      Passed - Last BP in normal range    BP Readings from Last 1 Encounters:  07/05/24 133/81         Passed - Valid encounter within last 6 months    Recent Outpatient Visits           1 week ago Schizophrenia, unspecified type (HCC)   [place] [facility] [place], Melanie T, NP   1 month ago Chronic pain syndrome   [place] [facility] [place], Melanie T, NP   3 months ago Schizophrenia, unspecified type [facility])   [place] [facility] [place], Melanie T, NP   5 months ago Lumbar radiculopathy   [facility] [place], [place], DO

## 2024-07-12 NOTE — Telephone Encounter (Signed)
 FYI Only or Action Required?: Action required by provider: update on patient condition. Patient would like to know if she can take something else besides Lyrica .  Patient was last seen in primary care on 07/05/2024 by Valerio Melanie DASEN, NP.  Called Nurse Triage reporting Vomiting.  Symptoms began several days ago.  Interventions attempted: Other: stopped her Lyrica  )last dose was Wednesday 07/06/24, as she states she threw up the dose on Thursday).  Symptoms are: vomiting Thursday thru Saturday; loose stools (4 episodes today), upper back and leg pain (chronic) gradually improving; headache (resolved).  Triage Disposition: Call PCP Within 24 Hours  Patient/caregiver understands and will follow disposition?: Yes           Copied from CRM #[id number]. Topic: Clinical - Medication Question >> Jul 12, 2024 11:10 AM Cleave MATSU wrote: Reason for CRM: pt said the pregablin is making her sick can Dr. Jolene give her something in place of that? Reason for Disposition  Vomiting a prescription medication  Answer Assessment - Initial Assessment Questions 1. VOMITING SEVERITY: How many times have you vomited in the past 24 hours?      She states she has not had any vomiting today due to she has held her Lyrica  (last dose Wednesday, thew up her dose on Thursday). She states last vomited on Saturday.  2. ONSET: When did the vomiting begin?      07/07/24.  3. FLUIDS: What fluids or food have you vomited up today? Have you been able to keep any fluids down?     No vomiting today. She states today she had a cup of coffee and 3 cups of water. She states she has not eaten anything yet.  4. ABDOMEN PAIN: Are your having any abdomen pain? If Yes : How bad is it and what does it feel like? (e.g., crampy, dull, intermittent, constant)      No.  5. DIARRHEA: Is there any diarrhea? If Yes, ask: How many times today?      Yes, loose stools. She states they started Friday.She states she has  had about 4 stools today.  6. CONTACTS: Is there anyone else in the family with the same symptoms?      No.  7. CAUSE: What do you think is causing your vomiting?     Patient states the 75mg  to 100mg  of Lyrica  is too much for her and she thinks that is causing her symptoms.  8. HYDRATION STATUS: Any signs of dehydration? (e.g., dry mouth [not only dry lips], too weak to stand) When did you last urinate?     No new weakness or dry mouth. She states she has been able to urinate 4-5 times so far today.  9. OTHER SYMPTOMS: Do you have any other symptoms? (e.g., fever, headache, vertigo, vomiting blood or coffee grounds, recent head injury)     She states my upper back and legs still hurt. Denies fever, blood in vomit or stool, dizziness, recent injuries or falls. She states she had a headache for a couple of days when taking the Lyrica , resolved now.  10. PREGNANCY: Is there any chance you are pregnant? When was your last menstrual period?       N/A.  Protocols used: Vomiting-A-AH

## 2024-07-21 NOTE — Telephone Encounter (Signed)
 Ok for E2C2 to review.   Multiple attempts have bene made to reach patient. Please discuss if she returns call.

## 2024-08-24 ENCOUNTER — Encounter: Payer: Self-pay | Admitting: Family Medicine

## 2024-08-24 DIAGNOSIS — Z1231 Encounter for screening mammogram for malignant neoplasm of breast: Secondary | ICD-10-CM | POA: Diagnosis not present

## 2024-08-24 LAB — HM MAMMOGRAPHY

## 2024-08-28 NOTE — Patient Instructions (Incomplete)
 Chronic Back Pain Chronic back pain is back pain that lasts longer than 3 months. The pain may get worse at certain times (flare-ups). There are things you can do at home to manage your pain. Follow these instructions at home: Watch for any changes in your symptoms. Take these actions to help with your pain: Managing pain and stiffness     If told, put ice on the painful area. You may be told to use ice for 24-48 hours after a flare-up starts. Put ice in a plastic bag. Place a towel between your skin and the bag. Leave the ice on for 20 minutes, 2-3 times a day. If told, put heat on the painful area. Do this as often as told by your doctor. Use the heat source that your doctor recommends, such as a moist heat pack or a heating pad. Place a towel between your skin and the heat source. Leave the heat on for 20-30 minutes. If your skin turns bright red, take off the ice or heat right away to prevent skin damage. The risk of damage is higher if you cannot feel pain, heat, or cold. Soak in a warm bath. This can help with pain. Activity        Avoid bending and other activities that make the pain worse. When you stand: Keep your upper back and neck straight. Keep your shoulders pulled back. Avoid slouching. When you sit: Keep your back straight. Relax your shoulders. Do not round your shoulders or pull them backward. Do not sit or stand in one place for too long. Take short rest breaks during the day. Lying down or standing is often better than sitting. Resting can help relieve pain. When sitting or lying down for a long time, do some mild activity or stretching. This will help to prevent stiffness and pain. Get regular exercise. Ask your doctor what activities are safe for you. You may have to avoid lifting. Ask your provider how much you can safely lift. If you lift things: Bend your knees. Keep the weight close to your body. Avoid twisting. Medicines Take over-the-counter and  prescription medicines only as told by your doctor. You may need to take medicines for pain and swelling. These may be taken by mouth or put on the skin. You may also be given muscle relaxants. Ask your doctor if the medicine prescribed to you: Requires you to avoid driving or using machinery. Can cause trouble pooping (constipation). You may need to take these actions to prevent or treat trouble pooping: Drink enough fluid to keep your pee (urine) pale yellow. Take over-the-counter or prescription medicines. Eat foods that are high in fiber. These include beans, whole grains, and fresh fruits and vegetables. Limit foods that are high in fat and sugars. These include fried or sweet foods. General instructions  Sleep on a firm mattress. Try lying on your side with your knees slightly bent. If you lie on your back, put a pillow under your knees. Do not smoke or use any products that contain nicotine or tobacco. If you need help quitting, ask your doctor. Contact a doctor if: Your pain does not get better with rest or medicine. You have new pain. You have a fever. You lose weight quickly. You have trouble doing your normal activities. One or both of your legs or feet feel weak. One or both of your legs or feet lose feeling (have numbness). Get help right away if: You are not able to control when you pee  or poop. You have bad back pain and: You feel like you may vomit (nauseous). You vomit. You have pain in your chest or your belly (abdomen). You have shortness of breath. You faint. These symptoms may be an emergency. Get help right away. Call 911. Do not wait to see if the symptoms will go away. Do not drive yourself to the hospital. This information is not intended to replace advice given to you by your health care provider. Make sure you discuss any questions you have with your health care provider. Document Revised: 06/16/2022 Document Reviewed: 06/16/2022 Elsevier Patient Education   2024 [employer].

## 2024-08-30 ENCOUNTER — Ambulatory Visit: Admitting: Nurse Practitioner

## 2024-09-05 NOTE — Progress Notes (Unsigned)
   09/05/2024  Patient ID: Denise Everett, female   DOB: [birth year], 73 y.o.   MRN: [id number]  This patient is appearing on a report for being at risk of failing the adherence measure for cholesterol (statin) medications this calendar year.   Medication: atorvastatin  10 mg tablets Last fill date: 08/22/24 for 90 day supply  Insurance report was not up to date. No action needed at this time.   Hermie Reagor C. Hason [facility] PharmD Candidate Class of [phone]

## 2024-09-15 ENCOUNTER — Telehealth: Payer: Self-pay

## 2024-09-15 NOTE — Telephone Encounter (Signed)
 Copied from CRM [id number]. Topic: Clinical - Lab/Test Results >> Sep 14, 2024  5:37 PM Nathanel BROCKS wrote: Reason for CRM: Alfonso with [facility] [phone] ext  [phone] fax [phone].   They are needing the past most recent labs and notes from visit.

## 2024-09-15 NOTE — Telephone Encounter (Signed)
 Faxed as requested

## 2024-09-25 NOTE — Patient Instructions (Addendum)
 Dr. Marcelino -- [place], [place] Main [address] [place] [address] Phone: [phone] Fax: [phone]  Dr. Jacobo: [facility] [address] [place],  [place]  [address] Main: [phone]  Chronic Back Pain Chronic back pain is back pain that lasts longer than 3 months. The pain may get worse at certain times (flare-ups). There are things you can do at home to manage your pain. Follow these instructions at home: Watch for any changes in your symptoms. Take these actions to help with your pain: Managing pain and stiffness     If told, put ice on the painful area. You may be told to use ice for 24-48 hours after a flare-up starts. Put ice in a plastic bag. Place a towel between your skin and the bag. Leave the ice on for 20 minutes, 2-3 times a day. If told, put heat on the painful area. Do this as often as told by your doctor. Use the heat source that your doctor recommends, such as a moist heat pack or a heating pad. Place a towel between your skin and the heat source. Leave the heat on for 20-30 minutes. If your skin turns bright red, take off the ice or heat right away to prevent skin damage. The risk of damage is higher if you cannot feel pain, heat, or cold. Soak in a warm bath. This can help with pain. Activity        Avoid bending and other activities that make the pain worse. When you stand: Keep your upper back and neck straight. Keep your shoulders pulled back. Avoid slouching. When you sit: Keep your back straight. Relax your shoulders. Do not round your shoulders or pull them backward. Do not sit or stand in one place for too long. Take short rest breaks during the day. Lying down or standing is often better than sitting. Resting can help relieve pain. When sitting or lying down for a long time, do some mild activity or stretching. This will help to prevent stiffness and pain. Get  regular exercise. Ask your doctor what activities are safe for you. You may have to avoid lifting. Ask your provider how much you can safely lift. If you lift things: Bend your knees. Keep the weight close to your body. Avoid twisting. Medicines Take over-the-counter and prescription medicines only as told by your doctor. You may need to take medicines for pain and swelling. These may be taken by mouth or put on the skin. You may also be given muscle relaxants. Ask your doctor if the medicine prescribed to you: Requires you to avoid driving or using machinery. Can cause trouble pooping (constipation). You may need to take these actions to prevent or treat trouble pooping: Drink enough fluid to keep your pee (urine) pale yellow. Take over-the-counter or prescription medicines. Eat foods that are high in fiber. These include beans, whole grains, and fresh fruits and vegetables. Limit foods that are high in fat and sugars. These include fried or sweet foods. General instructions  Sleep on a firm mattress. Try lying on your side with your knees slightly bent. If you lie on your back, put a pillow under your knees. Do not smoke or use any products that contain nicotine or tobacco. If you need help quitting, ask your doctor. Contact a doctor if: Your pain does not get better with rest or medicine. You have new pain. You have a fever. You lose weight  quickly. You have trouble doing your normal activities. One or both of your legs or feet feel weak. One or both of your legs or feet lose feeling (have numbness). Get help right away if: You are not able to control when you pee or poop. You have bad back pain and: You feel like you may vomit (nauseous). You vomit. You have pain in your chest or your belly (abdomen). You have shortness of breath. You faint. These symptoms may be an emergency. Get help right away. Call 911. Do not wait to see if the symptoms will go away. Do not drive  yourself to the hospital. This information is not intended to replace advice given to you by your health care provider. Make sure you discuss any questions you have with your health care provider. Document Revised: 06/16/2022 Document Reviewed: 06/16/2022 Elsevier Patient Education  2024 [employer].

## 2024-09-30 ENCOUNTER — Ambulatory Visit (INDEPENDENT_AMBULATORY_CARE_PROVIDER_SITE_OTHER): Admitting: Nurse Practitioner

## 2024-09-30 ENCOUNTER — Encounter: Payer: Self-pay | Admitting: Nurse Practitioner

## 2024-09-30 VITALS — BP 110/77 | HR 94 | Temp 98.2°F | Resp 17 | Ht 60.0 in | Wt 131.0 lb

## 2024-09-30 DIAGNOSIS — F209 Schizophrenia, unspecified: Secondary | ICD-10-CM

## 2024-09-30 DIAGNOSIS — G8929 Other chronic pain: Secondary | ICD-10-CM

## 2024-09-30 DIAGNOSIS — D693 Immune thrombocytopenic purpura: Secondary | ICD-10-CM

## 2024-09-30 DIAGNOSIS — G894 Chronic pain syndrome: Secondary | ICD-10-CM

## 2024-09-30 DIAGNOSIS — I10 Essential (primary) hypertension: Secondary | ICD-10-CM

## 2024-09-30 DIAGNOSIS — J432 Centrilobular emphysema: Secondary | ICD-10-CM

## 2024-09-30 DIAGNOSIS — D472 Monoclonal gammopathy: Secondary | ICD-10-CM

## 2024-09-30 DIAGNOSIS — R7989 Other specified abnormal findings of blood chemistry: Secondary | ICD-10-CM

## 2024-09-30 DIAGNOSIS — N184 Chronic kidney disease, stage 4 (severe): Secondary | ICD-10-CM | POA: Diagnosis not present

## 2024-09-30 DIAGNOSIS — E7800 Pure hypercholesterolemia, unspecified: Secondary | ICD-10-CM

## 2024-09-30 MED ORDER — LIDOCAINE 5 % EX PTCH: 1.0000 | MEDICATED_PATCH | CUTANEOUS | 4 refills | Status: AC

## 2024-09-30 NOTE — Assessment & Plan Note (Signed)
 Chronic, ongoing.  Continue current medication regimen, Risperdal , and adjust as needed. Has been on this regimen for years with benefit.  Does have underlying paranoia at baseline.  Refills up to date.  Prolactin level remains elevated but has been on regimen for several years with tolerance and refuses changes. However, we could consider change to Seroquel which may lessen this side effect or adding on Abilify with Risperdal  which may reduce levels.

## 2024-09-30 NOTE — Progress Notes (Signed)
 BP 110/77 (BP Location: Left Arm, Patient Position: Sitting, Cuff Size: Normal)   Pulse 94   Temp 98.2 F (36.8 C) (Oral)   Resp 17   Ht 5' (1.524 m)   Wt 131 lb (59.4 kg)   LMP  (LMP Unknown)   SpO2 96%   BMI 25.58 kg/m    Subjective:    Patient ID: Denise Everett, female    DOB: 17-Jan-1951, 73 y.o.   MRN: [id number]  HPI: Denise Everett is a 73 y.o. female  Chief Complaint  Patient presents with   Back Pain    Doing same. Both legs tend to hurt as well when her back is bothering her.     HYPERTENSION / HYPERLIPIDEMIA Takes Lisinopril  5 MG daily and Atorvastatin  10 MG daily.  Satisfied with current treatment? yes Duration of hypertension: chronic BP monitoring frequency: not checking BP range:  BP medication side effects: no Duration of hyperlipidemia: chronic Cholesterol medication side effects: no Cholesterol supplements: none Medication compliance: fair compliance Aspirin: no Recent stressors: no Recurrent headaches: no Visual changes: no Palpitations: no Dyspnea: no Chest pain: no Lower extremity edema: no Dizzy/lightheaded: no   CHRONIC KIDNEY DISEASE Has not seen nephrology since 04/10/22. Was to return in October 2023, but did not attend visit, have discussed at length need to maintain these visits.  She has not returned, even after lengthy discussions with her. CKD status: stable Medications renally dose: yes Previous renal evaluation: yes Pneumovax:  Up to Date Influenza Vaccine:  Up to Date   COPD CT screening in 2018 which showed emphysema & aortic atherosclerosis and was to repeat in 12 months but did not. History of smoking, started at 13. Quit >6 years ago. Has missed follow-up screening due to no transportation, the SW has worked with patient in past in this and she still has not attended.  COPD status: stable Satisfied with current treatment?: yes Oxygen use: no Dyspnea frequency: none Cough frequency: none Rescue inhaler frequency:   none Limitation of activity: no Productive cough: none Last Spirometry: unknown --refuses today Pneumovax: Up to Date Influenza: Up to Date   CHRONIC PAIN  Not taking Lyrica  as reports this made her feel bad. This offers benefit to her chronic pain.   Pain control status: uncontrolled Duration: chronic Location: lower back and down legs Quality: dull, aching, and throbbing Current Pain Level: 5/10 Previous Pain Level: 10/10 Breakthrough pain: no Benefit from narcotic medications: no What Activities task can be accomplished with current medication?  All ADR currently Previous pain specialty evaluation: yes Non-narcotic analgesic meds: Tylenol  Narcotic contract: no   MGUS/THROMBOCYTOPENIA Followed with Dr. Jacobo for MGUS and ITP, last visit 04/01/22 and was to return in 6 months -- last CBC platelet was 57 in May 2025. She was to return to see them and has not, even after many conversations and trying to assist her on getting there.  She reports rare bruising and no bleeding gums.  Has not had colonoscopy, she has not been able to because she has no one to go with me. Saw GI on 05/01/20 last. Have worked with SW on transportation and still has not attended. Fever: no Nausea: no Vomiting: no Weight loss: no Decreased appetite: no Diarrhea: no Constipation: no Blood in stool: no Heartburn: yes Jaundice: no Rash: no Dysuria/urinary frequency: no Hematuria: no History of sexually transmitted disease: no Recurrent NSAID use: no   OSTEOPOROSIS Continues Evista . Has not gone for repeat DEXA as instructed. Satisfied with  current treatment?: yes Medication side effects: no Medication compliance: good compliance Past osteoporosis medications/treatments: as above Adequate calcium  & vitamin D : yes Intolerance to bisphosphonates:no Weight bearing exercises: yes   SCHIZOPHRENIA Takes Mirtazapine  and Risperdal  for mood.  Last Prolactin level 324, however she has been on this  regimen with benefit for several years and refuses to reduce or change. Mood status: stable Satisfied with current treatment?: yes Symptom severity: moderate  Duration of current treatment : chronic Side effects: no Medication compliance: good compliance Previous psychiatric medications: multiple Depressed mood: no Anxious mood: no Anhedonia: no Significant weight loss or gain: no Insomnia: none Fatigue: no Feelings of worthlessness or guilt: no Impaired concentration/indecisiveness: no Suicidal ideations: no Hopelessness: no Crying spells: no    09/30/2024    1:27 PM 07/05/2024    3:47 PM 01/21/2024    1:57 PM 10/19/2023    2:16 PM 08/27/2023    1:10 PM  Depression screen PHQ 2/9  Decreased Interest 0 0 0 0 0  Down, Depressed, Hopeless 0 0 0 0 0  PHQ - 2 Score 0 0 0 0 0  Altered sleeping 0 1 0 0 0  Tired, decreased energy 0 1 0 0 0  Change in appetite 0 0 3 0 0  Feeling bad or failure about yourself  0 0 0 0 0  Trouble concentrating 0 0 0 0 0  Moving slowly or fidgety/restless 0 0 0 0 0  Suicidal thoughts 0 0 0 0 0  PHQ-9 Score 0 2  3  0  0   Difficult doing work/chores  Not difficult at all Not difficult at all Not difficult at all Not difficult at all     Data saved with a previous flowsheet row definition       09/30/2024    1:27 PM 07/05/2024    3:47 PM 10/19/2023    2:16 PM 08/27/2023    1:11 PM  GAD 7 : Generalized Anxiety Score  Nervous, Anxious, on Edge 0 0 0 0  Control/stop worrying 0 0 0 0  Worry too much - different things 0 0 0 0  Trouble relaxing 0 0 0 0  Restless 0 0 0 0  Easily annoyed or irritable 0 0 0 0  Afraid - awful might happen 0 0 0 0  Total GAD 7 Score 0 0 0 0  Anxiety Difficulty  Not difficult at all Not difficult at all Not difficult at all   AIMS:  Facial and Oral Movements  Muscles of Facial Expression: Normal Lips and Perioral Area: None Jaw: None Tongue: sometimes moves a little more Extremity Movements: none Upper (arms,  wrists, hands, fingers): None Lower (legs, knees, ankles, toes): None Trunk Movements: none Neck, shoulders, hips: None Overall Severity : 1 Severity of abnormal movements (highest score from questions above): None Incapacitation due to abnormal movements: None Patient's awareness of abnormal movements (rate only patient's report): No Awareness Dental Status  Current problems with teeth and/or dentures?: no Does patient usually wear dentures?: yes    Relevant past medical, surgical, family and social history reviewed and updated as indicated. Interim medical history since our last visit reviewed. Allergies and medications reviewed and updated.  Review of Systems  Constitutional:  Negative for activity change, appetite change, diaphoresis, fatigue and fever.  Respiratory:  Negative for cough, chest tightness, shortness of breath and wheezing.   Cardiovascular:  Negative for chest pain, palpitations and leg swelling.  Gastrointestinal: Negative.   Musculoskeletal:  Positive for  back pain.  Neurological: Negative.   Psychiatric/Behavioral:  Negative for decreased concentration, self-injury, sleep disturbance and suicidal ideas. The patient is not nervous/anxious.    Per HPI unless specifically indicated above    Objective:    BP 110/77 (BP Location: Left Arm, Patient Position: Sitting, Cuff Size: Normal)   Pulse 94   Temp 98.2 F (36.8 C) (Oral)   Resp 17   Ht 5' (1.524 m)   Wt 131 lb (59.4 kg)   LMP  (LMP Unknown)   SpO2 96%   BMI 25.58 kg/m   Wt Readings from Last 3 Encounters:  09/30/24 131 lb (59.4 kg)  07/05/24 132 lb 3.2 oz (60 kg)  05/17/24 129 lb 9.6 oz (58.8 kg)    Physical Exam Vitals and nursing note reviewed.  Constitutional:      General: She is awake. She is not in acute distress.    Appearance: She is well-developed and well-groomed. She is not ill-appearing or toxic-appearing.  HENT:     Head: Normocephalic.     Right Ear: Hearing and external ear  normal.     Left Ear: Hearing and external ear normal.  Eyes:     General: Lids are normal.        Right eye: No discharge.        Left eye: No discharge.     Conjunctiva/sclera: Conjunctivae normal.     Pupils: Pupils are equal, round, and reactive to light.  Neck:     Thyroid : No thyromegaly.     Vascular: No carotid bruit.  Cardiovascular:     Rate and Rhythm: Normal rate and regular rhythm.     Heart sounds: Normal heart sounds. No murmur heard.    No gallop.  Pulmonary:     Effort: Pulmonary effort is normal. No accessory muscle usage or respiratory distress.     Breath sounds: Normal breath sounds.  Abdominal:     General: Bowel sounds are normal. There is no distension.     Palpations: Abdomen is soft.     Tenderness: There is no abdominal tenderness.  Musculoskeletal:     Cervical back: Normal range of motion and neck supple.     Right lower leg: No edema.     Left lower leg: No edema.  Lymphadenopathy:     Cervical: No cervical adenopathy.  Skin:    General: Skin is warm and dry.  Neurological:     Mental Status: She is alert and oriented to person, place, and time.     Deep Tendon Reflexes: Reflexes are normal and symmetric.     Reflex Scores:      Brachioradialis reflexes are 2+ on the right side and 2+ on the left side.      Patellar reflexes are 2+ on the right side and 2+ on the left side. Psychiatric:        Attention and Perception: Attention normal.        Mood and Affect: Mood normal.        Speech: Speech normal.        Behavior: Behavior normal. Behavior is cooperative.        Thought Content: Thought content normal.    Results for orders placed or performed in visit on 08/24/24  HM MAMMOGRAPHY   Collection Time: 08/24/24  2:48 PM  Result Value Ref Range   HM Mammogram 0-4 Bi-Rad 0-4 Bi-Rad, Self Reported Normal      Assessment & Plan:   Problem List Items  Addressed This Visit       Cardiovascular and Mediastinum   Hypertension   Chronic,  stable.  BP well below goal today. Continue current medication regimen and adjust as needed.  Recommend she check BP at home at least 3 mornings a week and document.  DASH diet focus.  If low readings consider discontinuation or reduction to 2.5 MG of Lisinopril .  LABS: CMP, CBC, Lipid.      Relevant Orders   Comprehensive metabolic panel with GFR   CBC with Differential/Platelet     Respiratory   Centrilobular emphysema (HCC)   Ongoing.  Noted on CT 02/03/2017.  Is a former smoker, reports quitting >7 years ago.  No current inhalers.  Benefit from spirometry in future, refuses today.  Had repeat lung CT ordered, but has not attended or scheduled this -- highly recommend she schedule this.  Is non compliant often with recommendations.  Will continue to work closely with [facility] team on assisting with this.        Genitourinary   CKD (chronic kidney disease) stage 4, GFR 15-29 ml/min (HCC) - Primary   Chronic, ongoing.  Continue current medication regimen and collaboration with nephrology.  [facility] collaboration continues, but she has not attended follow-ups.  Highly recommend she schedule follow-up with nephrology, have continued to recommend and she has not attended.  Discussed at length importance of nephrology involvement in CKD 4.  LABS: CBC, CMP.        Hematopoietic and Hemostatic   Idiopathic thrombocytopenic purpura (HCC) (Chronic)   Chronic, ongoing.  Continue to collaborate with hematology, recent note reviewed.  She denies bleeding or increased bruising.  Have highly recommended not to miss appointments with them due to disease process and ongoing continuity of care - provided number for her to call and schedule. She reports [employer] is able to get transportation for this.  Will obtain labs today.          Other   Chronic pain syndrome (Chronic)   Chronic, ongoing. She stopped Lyrica  as did not like how it made her feel.  Would like Lidocaine  patches, will send in and if not  covered will get OTC brand. Recommend Tylenol  as needed. Reiterated to stop Celecoxib  or any Ibuprofen due to her ITP.        Schizophrenia (HCC)   Chronic, ongoing.  Continue current medication regimen, Risperdal , and adjust as needed. Has been on this regimen for years with benefit.  Does have underlying paranoia at baseline.  Refills up to date.  Prolactin level remains elevated but has been on regimen for several years with tolerance and refuses changes. However, we could consider change to Seroquel which may lessen this side effect or adding on Abilify with Risperdal  which may reduce levels.      MGUS (monoclonal gammopathy of unknown significance)   Chronic. Continue to collaborate with hematology, recent note reviewed.  She denies bleeding or increased bruising. Highly recommend she return to hematology and provided number to call, she reports she will work with insurance on transportation.      Hyperlipidemia   Chronic, ongoing.  Continue current medication regimen and adjust as needed.  Lipid panel today.        Relevant Orders   Comprehensive metabolic panel with GFR   Lipid Panel w/o Chol/HDL Ratio   Elevated prolactin level   Due to Risperdal  use. Prolactin level remains elevated but has been on regimen for several years with tolerance and refuses changes. However,  we could consider change to Seroquel which may lessen this side effect or adding on Abilify with Risperdal  which may reduce levels.      Relevant Orders   Prolactin     Follow up plan: Return in about 6 months (around 03/30/2025) for HTN/HLD, SCHIZOPHRENIA, OSTEOPOROSIS, MGUS, CHRONIC PAIN.

## 2024-09-30 NOTE — Assessment & Plan Note (Signed)
 Chronic, stable.  BP well below goal today. Continue current medication regimen and adjust as needed.  Recommend she check BP at home at least 3 mornings a week and document.  DASH diet focus.  If low readings consider discontinuation or reduction to 2.5 MG of Lisinopril.  LABS: CMP, CBC, Lipid.

## 2024-09-30 NOTE — Assessment & Plan Note (Signed)
 Due to Risperdal  use. Prolactin level remains elevated but has been on regimen for several years with tolerance and refuses changes. However, we could consider change to Seroquel which may lessen this side effect or adding on Abilify with Risperdal  which may reduce levels.

## 2024-09-30 NOTE — Assessment & Plan Note (Signed)
 Chronic, ongoing.  Continue current medication regimen and adjust as needed. Lipid panel today.

## 2024-09-30 NOTE — Assessment & Plan Note (Signed)
 Ongoing.  Noted on CT 02/03/2017.  Is a former smoker, reports quitting >7 years ago.  No current inhalers.  Benefit from spirometry in future, refuses today.  Had repeat lung CT ordered, but has not attended or scheduled this -- highly recommend she schedule this.  Is non compliant often with recommendations.  Will continue to work closely with [facility] team on assisting with this.

## 2024-09-30 NOTE — Assessment & Plan Note (Signed)
 Chronic, ongoing.  Continue current medication regimen and collaboration with nephrology.  [facility] collaboration continues, but she has not attended follow-ups.  Highly recommend she schedule follow-up with nephrology, have continued to recommend and she has not attended.  Discussed at length importance of nephrology involvement in CKD 4.  LABS: CBC, CMP.

## 2024-09-30 NOTE — Assessment & Plan Note (Signed)
 Chronic, ongoing. She stopped Lyrica  as did not like how it made her feel.  Would like Lidocaine  patches, will send in and if not covered will get OTC brand. Recommend Tylenol  as needed. Reiterated to stop Celecoxib  or any Ibuprofen due to her ITP.

## 2024-09-30 NOTE — Assessment & Plan Note (Signed)
 Chronic, ongoing.  Continue to collaborate with hematology, recent note reviewed.  She denies bleeding or increased bruising.  Have highly recommended not to miss appointments with them due to disease process and ongoing continuity of care - provided number for her to call and schedule. She reports [employer] is able to get transportation for this.  Will obtain labs today.

## 2024-09-30 NOTE — Assessment & Plan Note (Signed)
 Chronic. Continue to collaborate with hematology, recent note reviewed.  She denies bleeding or increased bruising. Highly recommend she return to hematology and provided number to call, she reports she will work with insurance on transportation.

## 2024-10-01 ENCOUNTER — Ambulatory Visit: Payer: Self-pay | Admitting: Nurse Practitioner

## 2024-10-01 LAB — CBC WITH DIFFERENTIAL/PLATELET
Basophils Absolute: 0 x10E3/uL (ref 0.0–0.2)
Basos: 1 %
EOS (ABSOLUTE): 0.1 x10E3/uL (ref 0.0–0.4)
Eos: 2 %
Hematocrit: 38.3 % (ref 34.0–46.6)
Hemoglobin: 12.9 g/dL (ref 11.1–15.9)
Immature Grans (Abs): 0.1 x10E3/uL (ref 0.0–0.1)
Immature Granulocytes: 1 %
Lymphocytes Absolute: 2.4 x10E3/uL (ref 0.7–3.1)
Lymphs: 35 %
MCH: 32.7 pg (ref 26.6–33.0)
MCHC: 33.7 g/dL (ref 31.5–35.7)
MCV: 97 fL (ref 79–97)
Monocytes Absolute: 0.5 x10E3/uL (ref 0.1–0.9)
Monocytes: 8 %
Neutrophils Absolute: 3.7 x10E3/uL (ref 1.4–7.0)
Neutrophils: 53 %
Platelets: 62 x10E3/uL — CL (ref 150–450)
RBC: 3.95 x10E6/uL (ref 3.77–5.28)
RDW: 13.1 % (ref 11.7–15.4)
WBC: 6.8 x10E3/uL (ref 3.4–10.8)

## 2024-10-01 LAB — COMPREHENSIVE METABOLIC PANEL WITH GFR
ALT: 6 IU/L (ref 0–32)
AST: 12 IU/L (ref 0–40)
Albumin: 4 g/dL (ref 3.8–4.8)
Alkaline Phosphatase: 59 IU/L (ref 49–135)
BUN/Creatinine Ratio: 11 — ABNORMAL LOW (ref 12–28)
BUN: 20 mg/dL (ref 8–27)
Bilirubin Total: 0.3 mg/dL (ref 0.0–1.2)
CO2: 17 mmol/L — ABNORMAL LOW (ref 20–29)
Calcium: 9 mg/dL (ref 8.7–10.3)
Chloride: 102 mmol/L (ref 96–106)
Creatinine, Ser: 1.75 mg/dL — ABNORMAL HIGH (ref 0.57–1.00)
Globulin, Total: 2.7 g/dL (ref 1.5–4.5)
Glucose: 92 mg/dL (ref 70–99)
Potassium: 3.7 mmol/L (ref 3.5–5.2)
Sodium: 136 mmol/L (ref 134–144)
Total Protein: 6.7 g/dL (ref 6.0–8.5)
eGFR: 30 mL/min/1.73 — ABNORMAL LOW (ref >59)

## 2024-10-01 LAB — LIPID PANEL W/O CHOL/HDL RATIO
Cholesterol, Total: 166 mg/dL (ref 100–199)
HDL: 44 mg/dL (ref >39)
LDL Chol Calc (NIH): 101 mg/dL — ABNORMAL HIGH (ref 0–99)
Triglycerides: 119 mg/dL (ref 0–149)
VLDL Cholesterol Cal: 21 mg/dL (ref 5–40)

## 2024-10-01 LAB — PROLACTIN: Prolactin: 286 ng/mL — ABNORMAL HIGH (ref 3.6–25.2)

## 2024-10-01 NOTE — Progress Notes (Signed)
 Good morning, please let Denise Everett know her labs have returned: - Platelet count remains low, as we discussed I highly recommend you schedule to return and see Dr. Jacobo and the hematology crew for follow-up. - Prolactin level remains a bit elevated, but has trended down a little. Next visit we will discuss adding on a medication to help possibly lower this while you continue taking Risperdal . - Kidney function continues to show presence of kidney disease, ensure to follow-up with kidney doctor as we discussed. - Lipid panel showing some elevation, ensure you are taking your Atorvastatin . Any questions? Keep being amazing!!  Thank you for allowing me to participate in your care.  I appreciate you. Kindest regards, Faithlyn Recktenwald

## 2024-11-14 ENCOUNTER — Ambulatory Visit

## 2024-11-30 LAB — FECAL OCCULT BLOOD, IMMUNOCHEMICAL: IFOBT: NEGATIVE

## 2024-12-14 ENCOUNTER — Encounter: Payer: Self-pay | Admitting: Nurse Practitioner

## 2025-02-02 ENCOUNTER — Ambulatory Visit

## 2025-04-04 ENCOUNTER — Ambulatory Visit: Admitting: Nurse Practitioner
